# Patient Record
Sex: Male | Born: 1935 | Race: Black or African American | Hispanic: No | Marital: Married | Smoking: Never smoker
Health system: Southern US, Community
[De-identification: ages and names within clinical notes are randomized; demographics above are authoritative.]

## PROBLEM LIST (undated history)

## (undated) DIAGNOSIS — I495 Sick sinus syndrome: Secondary | ICD-10-CM

## (undated) DIAGNOSIS — N189 Chronic kidney disease, unspecified: Secondary | ICD-10-CM

## (undated) DIAGNOSIS — I82402 Acute embolism and thrombosis of unspecified deep veins of left lower extremity: Secondary | ICD-10-CM

## (undated) DIAGNOSIS — K922 Gastrointestinal hemorrhage, unspecified: Secondary | ICD-10-CM

## (undated) DIAGNOSIS — I2510 Atherosclerotic heart disease of native coronary artery without angina pectoris: Secondary | ICD-10-CM

## (undated) DIAGNOSIS — K5792 Diverticulitis of intestine, part unspecified, without perforation or abscess without bleeding: Secondary | ICD-10-CM

## (undated) DIAGNOSIS — I10 Essential (primary) hypertension: Secondary | ICD-10-CM

## (undated) DIAGNOSIS — I5022 Chronic systolic (congestive) heart failure: Secondary | ICD-10-CM

## (undated) DIAGNOSIS — F0390 Unspecified dementia without behavioral disturbance: Secondary | ICD-10-CM

## (undated) DIAGNOSIS — F32A Depression, unspecified: Secondary | ICD-10-CM

## (undated) DIAGNOSIS — F329 Major depressive disorder, single episode, unspecified: Secondary | ICD-10-CM

## (undated) DIAGNOSIS — Z950 Presence of cardiac pacemaker: Secondary | ICD-10-CM

## (undated) DIAGNOSIS — C61 Malignant neoplasm of prostate: Secondary | ICD-10-CM

## (undated) HISTORY — PX: CORONARY ARTERY BYPASS GRAFT: SHX141

## (undated) HISTORY — DX: Chronic kidney disease, unspecified: N18.9

## (undated) HISTORY — PX: PROSTATECTOMY: SHX69

## (undated) HISTORY — DX: Major depressive disorder, single episode, unspecified: F32.9

## (undated) HISTORY — DX: Essential (primary) hypertension: I10

## (undated) HISTORY — DX: Acute embolism and thrombosis of unspecified deep veins of left lower extremity: I82.402

## (undated) HISTORY — DX: Atherosclerotic heart disease of native coronary artery without angina pectoris: I25.10

## (undated) HISTORY — DX: Depression, unspecified: F32.A

---

## 1998-01-22 ENCOUNTER — Other Ambulatory Visit: Admission: RE | Admit: 1998-01-22 | Discharge: 1998-01-22 | Payer: Self-pay | Admitting: Interventional Cardiology

## 1999-05-22 ENCOUNTER — Encounter: Payer: Self-pay | Admitting: Gastroenterology

## 1999-05-22 ENCOUNTER — Ambulatory Visit (HOSPITAL_COMMUNITY): Admission: RE | Admit: 1999-05-22 | Discharge: 1999-05-22 | Payer: Self-pay | Admitting: Gastroenterology

## 1999-06-29 ENCOUNTER — Ambulatory Visit (HOSPITAL_COMMUNITY): Admission: RE | Admit: 1999-06-29 | Discharge: 1999-06-29 | Payer: Self-pay | Admitting: Gastroenterology

## 2000-05-13 ENCOUNTER — Inpatient Hospital Stay (HOSPITAL_COMMUNITY): Admission: EM | Admit: 2000-05-13 | Discharge: 2000-05-17 | Payer: Self-pay | Admitting: Emergency Medicine

## 2000-05-13 ENCOUNTER — Encounter: Payer: Self-pay | Admitting: Emergency Medicine

## 2000-08-21 ENCOUNTER — Emergency Department (HOSPITAL_COMMUNITY): Admission: EM | Admit: 2000-08-21 | Discharge: 2000-08-21 | Payer: Self-pay | Admitting: Emergency Medicine

## 2001-01-02 ENCOUNTER — Emergency Department (HOSPITAL_COMMUNITY): Admission: EM | Admit: 2001-01-02 | Discharge: 2001-01-02 | Payer: Self-pay | Admitting: Emergency Medicine

## 2001-01-02 ENCOUNTER — Encounter: Payer: Self-pay | Admitting: Emergency Medicine

## 2002-04-09 ENCOUNTER — Encounter: Payer: Self-pay | Admitting: Emergency Medicine

## 2002-04-09 ENCOUNTER — Emergency Department (HOSPITAL_COMMUNITY): Admission: EM | Admit: 2002-04-09 | Discharge: 2002-04-09 | Payer: Self-pay | Admitting: Emergency Medicine

## 2002-06-13 ENCOUNTER — Encounter: Admission: RE | Admit: 2002-06-13 | Discharge: 2002-06-13 | Payer: Self-pay | Admitting: Urology

## 2002-06-13 ENCOUNTER — Encounter: Payer: Self-pay | Admitting: Urology

## 2002-09-11 ENCOUNTER — Encounter: Payer: Self-pay | Admitting: Gastroenterology

## 2002-09-11 ENCOUNTER — Ambulatory Visit (HOSPITAL_COMMUNITY): Admission: RE | Admit: 2002-09-11 | Discharge: 2002-09-11 | Payer: Self-pay | Admitting: Gastroenterology

## 2004-02-28 ENCOUNTER — Ambulatory Visit (HOSPITAL_COMMUNITY): Admission: RE | Admit: 2004-02-28 | Discharge: 2004-02-28 | Payer: Self-pay | Admitting: Gastroenterology

## 2004-03-17 ENCOUNTER — Inpatient Hospital Stay (HOSPITAL_COMMUNITY): Admission: EM | Admit: 2004-03-17 | Discharge: 2004-03-24 | Payer: Self-pay | Admitting: Emergency Medicine

## 2004-07-09 ENCOUNTER — Ambulatory Visit (HOSPITAL_COMMUNITY): Admission: RE | Admit: 2004-07-09 | Discharge: 2004-07-09 | Payer: Self-pay | Admitting: Gastroenterology

## 2004-07-15 ENCOUNTER — Ambulatory Visit: Payer: Self-pay | Admitting: Internal Medicine

## 2004-08-18 ENCOUNTER — Emergency Department (HOSPITAL_COMMUNITY): Admission: EM | Admit: 2004-08-18 | Discharge: 2004-08-18 | Payer: Self-pay | Admitting: Emergency Medicine

## 2004-10-19 ENCOUNTER — Ambulatory Visit (HOSPITAL_COMMUNITY): Admission: RE | Admit: 2004-10-19 | Discharge: 2004-10-19 | Payer: Self-pay | Admitting: Urology

## 2005-01-29 ENCOUNTER — Inpatient Hospital Stay (HOSPITAL_COMMUNITY): Admission: EM | Admit: 2005-01-29 | Discharge: 2005-02-04 | Payer: Self-pay | Admitting: Emergency Medicine

## 2005-02-28 ENCOUNTER — Inpatient Hospital Stay (HOSPITAL_COMMUNITY): Admission: EM | Admit: 2005-02-28 | Discharge: 2005-03-11 | Payer: Self-pay | Admitting: Emergency Medicine

## 2005-03-12 ENCOUNTER — Encounter: Admission: RE | Admit: 2005-03-12 | Discharge: 2005-03-12 | Payer: Self-pay | Admitting: Cardiothoracic Surgery

## 2005-03-12 ENCOUNTER — Inpatient Hospital Stay (HOSPITAL_COMMUNITY): Admission: AD | Admit: 2005-03-12 | Discharge: 2005-03-16 | Payer: Self-pay | Admitting: Cardiothoracic Surgery

## 2005-03-23 ENCOUNTER — Emergency Department (HOSPITAL_COMMUNITY): Admission: EM | Admit: 2005-03-23 | Discharge: 2005-03-23 | Payer: Self-pay | Admitting: Emergency Medicine

## 2005-04-08 ENCOUNTER — Encounter (HOSPITAL_COMMUNITY): Admission: RE | Admit: 2005-04-08 | Discharge: 2005-07-07 | Payer: Self-pay | Admitting: Interventional Cardiology

## 2005-07-08 ENCOUNTER — Encounter (HOSPITAL_COMMUNITY): Admission: RE | Admit: 2005-07-08 | Discharge: 2005-07-30 | Payer: Self-pay | Admitting: Interventional Cardiology

## 2005-08-05 ENCOUNTER — Emergency Department (HOSPITAL_COMMUNITY): Admission: EM | Admit: 2005-08-05 | Discharge: 2005-08-05 | Payer: Self-pay | Admitting: Emergency Medicine

## 2005-08-09 ENCOUNTER — Ambulatory Visit (HOSPITAL_COMMUNITY): Admission: RE | Admit: 2005-08-09 | Discharge: 2005-08-09 | Payer: Self-pay | Admitting: Gastroenterology

## 2005-11-03 ENCOUNTER — Encounter (HOSPITAL_COMMUNITY): Admission: RE | Admit: 2005-11-03 | Discharge: 2006-02-01 | Payer: Self-pay | Admitting: Urology

## 2005-12-14 ENCOUNTER — Encounter: Admission: RE | Admit: 2005-12-14 | Discharge: 2005-12-14 | Payer: Self-pay | Admitting: Urology

## 2006-02-19 ENCOUNTER — Inpatient Hospital Stay (HOSPITAL_COMMUNITY): Admission: EM | Admit: 2006-02-19 | Discharge: 2006-02-19 | Payer: Self-pay | Admitting: Emergency Medicine

## 2007-03-03 ENCOUNTER — Encounter: Admission: RE | Admit: 2007-03-03 | Discharge: 2007-03-03 | Payer: Self-pay | Admitting: Gastroenterology

## 2007-08-09 ENCOUNTER — Encounter: Admission: RE | Admit: 2007-08-09 | Discharge: 2007-08-09 | Payer: Self-pay | Admitting: Neurology

## 2007-08-10 ENCOUNTER — Emergency Department (HOSPITAL_COMMUNITY): Admission: EM | Admit: 2007-08-10 | Discharge: 2007-08-10 | Payer: Self-pay | Admitting: Emergency Medicine

## 2007-08-11 ENCOUNTER — Ambulatory Visit (HOSPITAL_COMMUNITY): Admission: RE | Admit: 2007-08-11 | Discharge: 2007-08-11 | Payer: Self-pay

## 2007-08-24 ENCOUNTER — Ambulatory Visit (HOSPITAL_COMMUNITY): Admission: RE | Admit: 2007-08-24 | Discharge: 2007-08-24 | Payer: Self-pay | Admitting: Otolaryngology

## 2007-11-27 ENCOUNTER — Ambulatory Visit (HOSPITAL_COMMUNITY): Admission: RE | Admit: 2007-11-27 | Discharge: 2007-11-27 | Payer: Self-pay | Admitting: Internal Medicine

## 2008-06-21 ENCOUNTER — Encounter (HOSPITAL_COMMUNITY): Admission: RE | Admit: 2008-06-21 | Discharge: 2008-08-01 | Payer: Self-pay | Admitting: Urology

## 2008-08-07 ENCOUNTER — Emergency Department (HOSPITAL_COMMUNITY): Admission: EM | Admit: 2008-08-07 | Discharge: 2008-08-07 | Payer: Self-pay | Admitting: *Deleted

## 2009-09-23 ENCOUNTER — Encounter: Payer: Self-pay | Admitting: Emergency Medicine

## 2009-09-23 ENCOUNTER — Ambulatory Visit: Payer: Self-pay | Admitting: Internal Medicine

## 2009-09-23 ENCOUNTER — Inpatient Hospital Stay (HOSPITAL_COMMUNITY): Admission: EM | Admit: 2009-09-23 | Discharge: 2009-10-01 | Payer: Self-pay | Admitting: Internal Medicine

## 2009-09-25 ENCOUNTER — Encounter (INDEPENDENT_AMBULATORY_CARE_PROVIDER_SITE_OTHER): Payer: Self-pay | Admitting: Internal Medicine

## 2009-11-13 ENCOUNTER — Encounter (INDEPENDENT_AMBULATORY_CARE_PROVIDER_SITE_OTHER): Payer: Self-pay | Admitting: *Deleted

## 2010-02-10 ENCOUNTER — Encounter (INDEPENDENT_AMBULATORY_CARE_PROVIDER_SITE_OTHER): Payer: Self-pay | Admitting: *Deleted

## 2010-08-23 ENCOUNTER — Encounter: Payer: Self-pay | Admitting: Internal Medicine

## 2010-08-23 ENCOUNTER — Encounter: Payer: Self-pay | Admitting: Urology

## 2010-09-01 NOTE — Letter (Signed)
Summary: Device-Delinquent Check  [facility], Main Office  [address]   [place], [place] [address]   Phone: [phone]  Fax: [phone]     November 13, 2009 MRN: [id number]   [facility] [address] [place], [place]  [address]   Dear [name],  According to our records, you have not had your implanted device checked in the recommended period of time.  We are unable to determine appropriate device function without checking your device on a regular basis.  Please call our office to schedule an appointment as soon as possible.  If you are having your device checked by another physician, please call us so that we may update our records.  Thank you, Altha Harm, LPN  November 13, 2009 3:13 PM   [facility]

## 2010-09-01 NOTE — Letter (Signed)
Summary: Device-Delinquent Check  [facility], Main Office  [address]   [place], [place] [address]   Phone: [phone]  Fax: [phone]     February 10, 2010 MRN: [id number]   [facility] [address] [place], [place]  [address]   Dear [name],  According to our records, you have not had your implanted device checked in the recommended period of time.  We are unable to determine appropriate device function without checking your device on a regular basis.  Please call our office to schedule an appointment as soon as possible.  If you are having your device checked by another physician, please call us so that we may update our records.  Thank you,  Altha Harm, LPN  February 10, 2010 10:39 AM  [facility]  2nd notice

## 2010-10-21 LAB — IRON AND TIBC
Iron: 44 ug/dL (ref 42–135)
TIBC: 227 ug/dL (ref 215–435)

## 2010-10-21 LAB — BASIC METABOLIC PANEL
BUN: 27 mg/dL — ABNORMAL HIGH (ref 6–23)
BUN: 34 mg/dL — ABNORMAL HIGH (ref 6–23)
BUN: 35 mg/dL — ABNORMAL HIGH (ref 6–23)
CO2: 31 mEq/L (ref 19–32)
Calcium: 9 mg/dL (ref 8.4–10.5)
Calcium: 9.3 mg/dL (ref 8.4–10.5)
Chloride: 103 mEq/L (ref 96–112)
Creatinine, Ser: 2.14 mg/dL — ABNORMAL HIGH (ref 0.4–1.5)
GFR calc Af Amer: 38 mL/min — ABNORMAL LOW (ref >60)
GFR calc non Af Amer: 30 mL/min — ABNORMAL LOW (ref >60)
GFR calc non Af Amer: 31 mL/min — ABNORMAL LOW (ref >60)
GFR calc non Af Amer: 35 mL/min — ABNORMAL LOW (ref >60)
Glucose, Bld: 134 mg/dL — ABNORMAL HIGH (ref 70–99)
Potassium: 3.8 mEq/L (ref 3.5–5.1)
Potassium: 3.9 mEq/L (ref 3.5–5.1)
Potassium: 4.4 mEq/L (ref 3.5–5.1)
Sodium: 141 mEq/L (ref 135–145)

## 2010-10-21 LAB — GLUCOSE, CAPILLARY
Glucose-Capillary: 115 mg/dL — ABNORMAL HIGH (ref 70–99)
Glucose-Capillary: 128 mg/dL — ABNORMAL HIGH (ref 70–99)
Glucose-Capillary: 130 mg/dL — ABNORMAL HIGH (ref 70–99)
Glucose-Capillary: 142 mg/dL — ABNORMAL HIGH (ref 70–99)
Glucose-Capillary: 146 mg/dL — ABNORMAL HIGH (ref 70–99)
Glucose-Capillary: 156 mg/dL — ABNORMAL HIGH (ref 70–99)
Glucose-Capillary: 157 mg/dL — ABNORMAL HIGH (ref 70–99)
Glucose-Capillary: 157 mg/dL — ABNORMAL HIGH (ref 70–99)
Glucose-Capillary: 159 mg/dL — ABNORMAL HIGH (ref 70–99)
Glucose-Capillary: 163 mg/dL — ABNORMAL HIGH (ref 70–99)
Glucose-Capillary: 164 mg/dL — ABNORMAL HIGH (ref 70–99)
Glucose-Capillary: 171 mg/dL — ABNORMAL HIGH (ref 70–99)
Glucose-Capillary: 175 mg/dL — ABNORMAL HIGH (ref 70–99)
Glucose-Capillary: 177 mg/dL — ABNORMAL HIGH (ref 70–99)
Glucose-Capillary: 191 mg/dL — ABNORMAL HIGH (ref 70–99)
Glucose-Capillary: 240 mg/dL — ABNORMAL HIGH (ref 70–99)

## 2010-10-21 LAB — CBC
HCT: 39.6 % (ref 39.0–52.0)
HCT: 40.8 % (ref 39.0–52.0)
Hemoglobin: 13.5 g/dL (ref 13.0–17.0)
MCHC: 34 g/dL (ref 30.0–36.0)
MCHC: 34.2 g/dL (ref 30.0–36.0)
MCV: 92.6 fL (ref 78.0–100.0)
MCV: 92.9 fL (ref 78.0–100.0)
Platelets: 133 10*3/uL — ABNORMAL LOW (ref 150–400)
Platelets: 158 10*3/uL (ref 150–400)
RBC: 4.15 MIL/uL — ABNORMAL LOW (ref 4.22–5.81)
RBC: 4.25 MIL/uL (ref 4.22–5.81)
RDW: 13.3 % (ref 11.5–15.5)
RDW: 13.4 % (ref 11.5–15.5)
WBC: 4.5 10*3/uL (ref 4.0–10.5)

## 2010-10-21 LAB — CK TOTAL AND CKMB (NOT AT ARMC)
CK, MB: 1.2 ng/mL (ref 0.3–4.0)
Total CK: 43 U/L (ref 7–232)

## 2010-10-21 LAB — PSA: PSA: 26.27 ng/mL — ABNORMAL HIGH (ref 0.10–4.00)

## 2010-10-21 LAB — FREE PSA: PSA, Free: 1.7 ng/mL

## 2010-10-21 LAB — CARDIAC PANEL(CRET KIN+CKTOT+MB+TROPI)
Relative Index: INVALID (ref 0.0–2.5)
Relative Index: INVALID (ref 0.0–2.5)
Total CK: 52 U/L (ref 7–232)
Troponin I: 0.07 ng/mL — ABNORMAL HIGH (ref 0.00–0.06)

## 2010-10-21 LAB — BRAIN NATRIURETIC PEPTIDE
Pro B Natriuretic peptide (BNP): 1357 pg/mL — ABNORMAL HIGH (ref 0.0–100.0)
Pro B Natriuretic peptide (BNP): 172 pg/mL — ABNORMAL HIGH (ref 0.0–100.0)
Pro B Natriuretic peptide (BNP): 3002 pg/mL — ABNORMAL HIGH (ref 0.0–100.0)
Pro B Natriuretic peptide (BNP): 3133 pg/mL — ABNORMAL HIGH (ref 0.0–100.0)

## 2010-10-21 LAB — RETICULOCYTES: Retic Ct Pct: 2.5 % (ref 0.4–3.1)

## 2010-10-21 LAB — HEMOGLOBIN A1C
Hgb A1c MFr Bld: 6.8 % — ABNORMAL HIGH (ref 4.6–6.1)
Mean Plasma Glucose: 148 mg/dL

## 2010-10-21 LAB — TROPONIN I: Troponin I: 0.04 ng/mL (ref 0.00–0.06)

## 2010-10-23 LAB — URINALYSIS, ROUTINE W REFLEX MICROSCOPIC
Hgb urine dipstick: NEGATIVE
Nitrite: NEGATIVE
Specific Gravity, Urine: 1.017 (ref 1.005–1.030)
Urobilinogen, UA: 1 mg/dL (ref 0.0–1.0)

## 2010-10-23 LAB — CBC
HCT: 39.1 % (ref 39.0–52.0)
Hemoglobin: 12.8 g/dL — ABNORMAL LOW (ref 13.0–17.0)
MCHC: 32.7 g/dL (ref 30.0–36.0)
RDW: 13.5 % (ref 11.5–15.5)

## 2010-10-23 LAB — COMPREHENSIVE METABOLIC PANEL
ALT: 36 U/L (ref 0–53)
AST: 25 U/L (ref 0–37)
Alkaline Phosphatase: 60 U/L (ref 39–117)
CO2: 27 mEq/L (ref 19–32)
Chloride: 114 mEq/L — ABNORMAL HIGH (ref 96–112)
GFR calc Af Amer: 36 mL/min — ABNORMAL LOW (ref >60)
GFR calc non Af Amer: 30 mL/min — ABNORMAL LOW (ref >60)
Potassium: 4.7 mEq/L (ref 3.5–5.1)
Sodium: 144 mEq/L (ref 135–145)
Total Bilirubin: 1.1 mg/dL (ref 0.3–1.2)

## 2010-10-23 LAB — FOLATE: Folate: 9.3 ng/mL

## 2010-10-23 LAB — DIFFERENTIAL
Eosinophils Relative: 1 % (ref 0–5)
Lymphocytes Relative: 15 % (ref 12–46)
Lymphs Abs: 0.7 10*3/uL (ref 0.7–4.0)

## 2010-10-23 LAB — BASIC METABOLIC PANEL
CO2: 21 mEq/L (ref 19–32)
Glucose, Bld: 226 mg/dL — ABNORMAL HIGH (ref 70–99)
Potassium: 4.4 mEq/L (ref 3.5–5.1)
Sodium: 141 mEq/L (ref 135–145)

## 2010-10-23 LAB — CK TOTAL AND CKMB (NOT AT ARMC): CK, MB: 0.9 ng/mL (ref 0.3–4.0)

## 2010-10-23 LAB — HEMOGLOBIN A1C: Hgb A1c MFr Bld: 6.8 % — ABNORMAL HIGH (ref 4.6–6.1)

## 2010-10-23 LAB — URINE MICROSCOPIC-ADD ON

## 2010-10-23 LAB — T4, FREE: Free T4: 1.23 ng/dL (ref 0.80–1.80)

## 2010-10-23 LAB — PSA: PSA: 23.4 ng/mL — ABNORMAL HIGH (ref 0.10–4.00)

## 2010-10-23 LAB — VITAMIN B12: Vitamin B-12: 649 pg/mL (ref 211–911)

## 2010-10-23 LAB — RETICULOCYTES: Retic Ct Pct: 2.6 % (ref 0.4–3.1)

## 2010-10-23 LAB — MAGNESIUM: Magnesium: 2.1 mg/dL (ref 1.5–2.5)

## 2010-10-25 LAB — GLUCOSE, CAPILLARY
Glucose-Capillary: 131 mg/dL — ABNORMAL HIGH (ref 70–99)
Glucose-Capillary: 132 mg/dL — ABNORMAL HIGH (ref 70–99)
Glucose-Capillary: 143 mg/dL — ABNORMAL HIGH (ref 70–99)
Glucose-Capillary: 157 mg/dL — ABNORMAL HIGH (ref 70–99)

## 2010-10-25 LAB — CBC
HCT: 40.2 % (ref 39.0–52.0)
HCT: 42.8 % (ref 39.0–52.0)
Hemoglobin: 14.3 g/dL (ref 13.0–17.0)
MCHC: 33.7 g/dL (ref 30.0–36.0)
MCV: 93.8 fL (ref 78.0–100.0)
Platelets: 186 10*3/uL (ref 150–400)
Platelets: 216 10*3/uL (ref 150–400)
RDW: 13.4 % (ref 11.5–15.5)
WBC: 4.1 10*3/uL (ref 4.0–10.5)
WBC: 4.6 10*3/uL (ref 4.0–10.5)

## 2010-11-16 LAB — CBC
HCT: 40.8 % (ref 39.0–52.0)
MCHC: 34.2 g/dL (ref 30.0–36.0)
MCV: 88.9 fL (ref 78.0–100.0)
Platelets: 160 10*3/uL (ref 150–400)
WBC: 4.2 10*3/uL (ref 4.0–10.5)

## 2010-11-16 LAB — DIFFERENTIAL
Basophils Relative: 0 % (ref 0–1)
Eosinophils Absolute: 0.1 10*3/uL (ref 0.0–0.7)
Eosinophils Relative: 2 % (ref 0–5)
Lymphs Abs: 1.5 10*3/uL (ref 0.7–4.0)
Neutrophils Relative %: 52 % (ref 43–77)

## 2010-11-16 LAB — BASIC METABOLIC PANEL
BUN: 22 mg/dL (ref 6–23)
CO2: 27 mEq/L (ref 19–32)
Chloride: 105 mEq/L (ref 96–112)
Creatinine, Ser: 1.84 mg/dL — ABNORMAL HIGH (ref 0.4–1.5)
Glucose, Bld: 159 mg/dL — ABNORMAL HIGH (ref 70–99)
Potassium: 3.7 mEq/L (ref 3.5–5.1)

## 2010-11-16 LAB — POCT CARDIAC MARKERS
CKMB, poc: 1.1 ng/mL (ref 1.0–8.0)
Troponin i, poc: <0.05 ng/mL (ref 0.00–0.09)

## 2010-11-16 LAB — BRAIN NATRIURETIC PEPTIDE: Pro B Natriuretic peptide (BNP): 258 pg/mL — ABNORMAL HIGH (ref 0.0–100.0)

## 2010-12-18 ENCOUNTER — Other Ambulatory Visit: Payer: Self-pay | Admitting: Gastroenterology

## 2010-12-18 NOTE — H&P (Signed)
NAMEKHORI, ROSEVEAR             ACCOUNT NO.:  [id number]   MEDICAL RECORD NO.:  [id number]          PATIENT TYPE:  INP   LOCATION:  1828                         FACILITY:  [facility]   PHYSICIAN:  Peter M. Swaziland, M.D.  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  He is status post CABG in 1991 by Dr.  Ofilia Neas.  He was recently admitted on January 29, 2005 with recurrent  unstable angina.  He was initially treated conservatively, but due to  recurrent angina he did undergo cardiac catheterization on February 03, 2005.  This demonstrated a 70-80% stenosis in LAD after the first diagonal.  He had  a 70-80% diagonal stenosis.  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  The right coronary artery had 85% stenosis proximally, as  well as 85-90% distal disease and a 90% stenosis in the PDA.  The LIMA graft  to the LAD was patent, but the vein grafts were all occluded.  It was felt  that his symptoms were related to progressive disease in his right coronary  artery.  Surgical consultation was obtained, but the patient opted for  continued conservative medical therapy.  Since discharge he has continued to  have exertional angina with minimal exertion.  Today he complains of severe  substernal chest pain at rest, radiating to his left axilla.  He had no  shortness of breath or diaphoresis.  He took three sublingual nitroglycerin  without relief and then presented to the emergency department.  His pain is  now 90% relieved with IV nitroglycerin.   PAST MEDICAL HISTORY:  1.  ASCVD status post CABG in 1991.  This included the LIMA graft to the      LAD, saphenous vein graft to the second obtuse marginal vessel,  saphenous vein graft to the diagonal.  He subsequently had stenting of      the vein graft to the marginal vessel in 2001.  Since then he has had      documented occlusion of both vein grafts.  2.  Hypertension.  3.  Complete heart block status post DDD pacemaker in August 2005.  4.  Gastritis.  5.  Hypercholesterolemia.  6.  Colon polyps.  7.  Status post esophageal dilatation.  8.  History of prostate CA status post radical prostatectomy and radiation      therapy.  9.  Status post drainage of a liver abscess.  10. Chronic renal insufficiency.   ALLERGIES:  1.  INTOLERANCE TO STATINS.  2.  HE HAS HAD A HISTORY OF GASTRITIS ON ASPIRIN.  3.  HE HAS A HISTORY OF COUGH RELATED TO ACE INHIBITORS.   CURRENT MEDICATIONS:  1.  Norvasc 5 mg daily.  2.  Atenolol 50 mg daily.  3.  IMDUR 60 mg daily.  4.  Plavix 75 mg daily.  5.  HCTZ 25 mg every other day.  6.  Protonix 40 mg daily.  7.  Nitroglycerin p.r.n.   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  He is married and has two children.  He denies  smoking or alcohol use.   FAMILY HISTORY:  Noncontributory.   REVIEW OF SYSTEMS:  He denies any history of TIA or stroke.  He has had no  claudication.  Other review of systems are negative.   PHYSICAL EXAMINATION:  The patient is a pleasant black male in no apparent  distress.  He is afebrile, blood pressure is 152/68, pulse 60 and paced,  sats are 100% on 2 liters nasal cannula.  Respirations are unlabored.  HEENT:  Normocephalic and atraumatic.  Pupils equal, round and reactive.  Sclerae clear.  Oropharynx is clear.  NECK:  Without JVD, adenopathy or bruits.  LUNGS:  Clear.  CARDIAC:  Exam reveals regular rate and rhythm without murmurs, rubs,  gallops, or clicks.  ABDOMEN:  Soft, nontender without masses.  EXTREMITIES:  Without edema.  Pulses are 2+ and symmetric.  NEUROLOGIC:  Exam is intact.   LABORATORY DATA:  Chest  x-ray shows no active disease, pacemaker is in  adequate position.  ECG shows AV sequential pacing, white count 3800,  hemoglobin 12.6, hematocrit 36.5, platelets 184,000, sodium 141, potassium  4.4, chloride 109, CO2 25, BUN 18, creatinine 1.8, glucose 120, CK-MB 1.1,  troponin less than 0.05.   IMPRESSION:  1.  Recurrent unstable angina pectoris in a patient with known severe      coronary disease status post coronary artery bypass grafting in 1991.  2.  Chronic renal insufficiency.  3.  Hypertension.  4.  History of gastritis.  5.  History of complete heart block status post DDD pacemaker implant.  6.  Hypercholesterolemia.  7.  History of prostate cancer.   PLAN:  We will admit to telemetry and rule out for myocardial infarction.  He will be treated with intravenous nitroglycerin and subcu Lovenox.  We  will continue his prior medications with the exception of holding his Plavix  in anticipation of possible redo coronary artery bypass surgery.  Need to  reconsult Dr. Tyrone Sage in the morning.       PMJ/MEDQ  D:  02/28/2005  T:  02/28/2005  Job:  [id number]   cc:   Lyn Records, M.D.  [address]  [place]  [place] [address]  Fax: [phone]

## 2010-12-18 NOTE — Op Note (Signed)
[name], [name]             ACCOUNT NO.:  [id number]   MEDICAL RECORD NO.:  [id number]          PATIENT TYPE:  INP   LOCATION:  2003                         FACILITY:  [facility]   PHYSICIAN:  Kerin Perna, M.D.  DATE OF BIRTH:  09-18-1922   DATE OF PROCEDURE:  03/12/2005  DATE OF DISCHARGE:                                 OPERATIVE REPORT   OPERATION:  Right chest tube placement.   PREOPERATIVE DIAGNOSIS:  Right pneumothorax, 50%.   POSTOPERATIVE DIAGNOSIS:  Right pneumothorax, 50%.   SURGEON:  Kerin Perna, M.D.   ANESTHESIA:  Local 1% lidocaine.   INDICATIONS:  The patient is a 77 year old male who had undergone previous  redo coronary bypass grafting by Dr. Tyrone Sage on March 05, 2005.  He was  discharged from the hospital on March 11, 2005. The chest x-ray taken two  days prior to discharge showed a small residual right 15% pneumothorax.  After returning home, following having progressive shortness of breath and  presented to the office today for evaluation. A chest x-ray showed a right  40-50% pneumothorax. His saturation on room air was 97% however, he became  dyspneic with any exertion. It was decided to admit the patient for right  chest tube placement. I discussed the procedure in detail with the patient  including the benefits and risks and he agreed with informed consent.   PROCEDURE:  The right chest was prepped and draped. The patient was  premedicated with IV morphine and Versed. A 1% local lidocaine was  infiltrated in the right inframammary area crease. A 1 inch incision was  made and lidocaine was infiltrated through the subcutaneous tissue down to  the intercostal muscle. The right pleural space was entered and air was  emitted. A 20-French chest tube was placed and directed to the apex and  secured to the skin with two silk sutures. A sterile dressing was applied.  The drainage tube was connected to the Pleur-Evac chamber. The chest x-ray  is  pending.       PV/MEDQ  D:  03/12/2005  T:  03/13/2005  Job:  [id number]

## 2010-12-18 NOTE — Discharge Summary (Signed)
NAMEHILDA, [name]             ACCOUNT NO.:  [id number]   MEDICAL RECORD NO.:  [id number]          PATIENT TYPE:  INP   LOCATION:  2021                         FACILITY:  [facility]   PHYSICIAN:  Sheliah Plane, MD    DATE OF BIRTH:  [birth year]   DATE OF ADMISSION:  02/28/2005  DATE OF DISCHARGE:  03/11/2005                                 DISCHARGE SUMMARY   PRIMARY DIAGNOSIS:  Recurrent coronary occlusive disease.   IN-HOSPITAL DIAGNOSES:  1.  Postoperative atrial fibrillation.  2.  Acute renal insufficiency.  3.  Postoperative thrombocytopenia.   SECONDARY DIAGNOSES:  1.  Atherosclerotic cardiovascular disease, status post coronary artery      bypass graft in 1991.  This included a left internal mammary artery      graft to the left anterior descending, saphenous vein graft to the      second obtuse marginal vessel, saphenous vein graft to the diagonal.      The patient's subsequently had stenting of the vein graft to the      marginal graft to the marginal vessel in 2001.  2.  Hypertension.  3.  Complete heart block, status post DDD pacemaker in August 2005.  4.  Gastritis.  5.  Hypercholesterolemia.  6.  Colon polyps.  7.  Status post esophageal dilatation.  8.  History of prostate cancer, status post radical prostatectomy and      radiation therapy.  9.  Status post drainage of a liver abscess.  10. Chronic renal insufficiency.   ALLERGIES:  The patient states intolerance to STATINS,  history of gastritis  on ASPIRIN. History  of intolerance to COX-2 ACE INHIBITORS.   IN-HOSPITAL OPERATIONS/PROCEDURES:  Repeat coronary artery bypass grafting  x5 with sequential reverse saphenous vein graft to first diagonal distal  circumflex, reverse saphenous vein graft to second diagonal branch, reverse  saphenous vein graft sequential to acute marginal posterior descending  coronary artery, preservation of previously-placed left internal mammary to  the left anterior descending  coronary artery, median sternotomy, and right  leg Endovein harvesting.   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 He is status  post CABG in 1991 by Dr. Tyrone Sage.  The patient also has a history of  complete heart block for which he is status post DDD pacemaker in August of  2005.  He was recently admitted on January 29, 2005 with recurrent unstable  angina.  The patient was initially treated conservatively but had recurrent  angina and underwent cardiac catheterization on February 03, 2005.  This showed  70-80% stenosis in the LAD at the first diagonal.  He had 70-80% diagonal  stenosis.  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  The RCA had  an 85% stenosis proximally as well as 85-90% distal disease and then 80%  stenosis in the PDA.  The LIMA graft to the LAD was patent, but the vein  grafts were all occluded.  Following cardiac catheterization,  CVTS was  consulted.  The patient was seen and evaluated by Dr. Tyrone Sage.  Dr. Jaynie Collins  discussed redo CABG with the patient.  He discussed risks and benefits of  the procedure.  The patient acknowledged understanding and wished to  proceed.  Surgery was scheduled for March 05, 2005.   The patient was taken to the operating room on March 05, 2005 where he  underwent redo coronary artery bypass grafting x5 with sequential reverse  saphenous vein graft to first diagonal distal circumflex, with reverse  saphenous vein graft to the second diagonal branch with reverse saphenous  vein graft sequential to the acute marginal posterior descending coronary  artery.  Preservation of the previously-placed left internal mammary artery  to the left anterior descending artery was done.  The patient had a median  sternotomy and right leg Endovein harvesting.  The patient tolerated  this  procedure well and was transferred up to the intensive care unit in stable  condition.  Following the surgery, the patient seemed to be hemodynamically  stable.  The patient was extubated at the end of surgery.  The patient  remained hemodynamically stable postoperatively.  His chest tubes and lines  were discontinued on postoperative day #1.  He was able to be weaned from  all his drips.  The patient's pacemaker was tested postoperatively.  He was  A-paced during his postoperative course.  Cardiology continued to followup  with the patient during his postoperative course.  Follow-up chest x-rays  were obtained, and the patient was seen to have a small right pneumothorax.  These were continued to be monitored, and the patient was seen to have  resolved his right pneumothorax prior to discharge home.  The patient did  develop postoperative atrial fibrillation for which his medications were  adjusted for a period.  He was able to convert back into normal sinus rhythm  and remained in normal sinus rhythm during the remainder of his hospital  stay.  The patient has a history of chronic renal insufficiency and had  developed acute-on-chronic renal insufficiency postoperatively.  Diuretics  were discontinued, and this was monitored.  The patient was back down to  baseline prior to discharge home.  The patient also developed  thrombocytopenia postoperatively.  This was monitored.  It continued to  trend up over his postoperative course.  The patient's incisions remained  dry and intact and healing well.  He was out of bed, ambulating well.  He  was able to be weaned from oxygen, saturating greater than 90% prior to  discharge home.  The patient's blood pressure was stable.  The patient was  down to a baseline weight prior to discharge.   The patient is discharged to home on postoperative day #6, March 11, 2005,  in stable condition.  A follow-up appointment was scheduled with  Sheliah Plane, M.D. on April 01, 2005 at 11:20 a.m.  The patient will follow up  with Dr. Katrinka Blazing in two weeks.  He will obtain a PA and lateral chest x-ray  with Dr. Katrinka Blazing which he will bring with him to Dr. Dennie Maizes appointment.   Dr. Randa Evens received instructions on diet, activity level, and incisional  care.  He was told not to drive until released to do so.  No heavy lifting  over 10 pounds.  The patient was told he was allowed to shower, washing his  incisions using soap and water.  He is to contact the office if he develops  any drainage or  opening from any of his incision sites.  The patient was  told to ambulate 3-4 times per day, progress as tolerated, and continue his  breathing exercises.  The patient was educated on a diet to be low-fat, low-  salt.  He acknowledges understanding.   DISCHARGE MEDICATIONS:  1.  Aspirin 81 p.o. daily.  2.  Atenolol 50 mg daily.  3.  Norvasc 5 mg daily.  4.  Protonix 20 mg daily.  5.  Multivitamin daily.  6.  Ativan 0.5 mg two times per day p.r.n.  7.  Tylenol No. 3 with codeine 1-2 tablets p.o. q.4-6 h. p.r.n. pain.      Theda Belfast, [place]      Sheliah Plane, MD  Electronically Signed    KMD/MEDQ  D:  06/23/2005  T:  06/23/2005  Job:  [id number]   cc:   Lyn Records, M.D.  Fax: [phone]

## 2010-12-18 NOTE — Op Note (Signed)
[name], [name]             ACCOUNT NO.:  [id number]   MEDICAL RECORD NO.:  [id number]          PATIENT TYPE:  INP   LOCATION:  2302                         FACILITY:  [facility]   PHYSICIAN:  Sheliah Plane, MD    DATE OF BIRTH:  Aug 27, 1922   DATE OF PROCEDURE:  03/05/2005  DATE OF DISCHARGE:                                 OPERATIVE REPORT   PREOPERATIVE DIAGNOSIS:  Coronary occlusive disease.   POSTOPERATIVE DIAGNOSIS:  Coronary occlusive disease.   SURGICAL PROCEDURE:  Repeat coronary artery bypass grafting x5, with  sequential reverse saphenous vein graft to the first diagonal and distal  circumflex; first saphenous vein graft to the second diagonal branch;  reverse saphenous vein graft sequentially to the acute marginal and  posterior descending coronary artery, with preservation of the previously  placed left internal mammary to the left anterior descending coronary  artery; redo sternotomy and right leg Endo-Vein harvesting.   SURGEON:  Sheliah Plane, MD   FIRST ASSISTANT:  Salvatore Decent. [name], M.D.   SECOND ASSISTANT:  Rowe Clack, P.A.-C.   BRIEF HISTORY:  The patient is a 77 year old male with longstanding coronary  occlusive disease, who had undergo coronary artery bypass grafting in 1991.  He had done well until the last year and a half, when he began having  recurrent anginal pains.  A stent graft was placed in a vein graft to the  diagonal.  His mammary artery remained free of disease.  At the patient's  previous operation he did not have coronary occlusive disease in the right  system.  Over the years he has developed a proximal and distal disease  throughout the right system.  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  The patient was to try medical  management, but because of recurrent episodes of anginal pain requiring  readmission in spite of maximal medical treatment, he agreed to proceed with  coronary  artery bypass grafting.  The risks and options were discussed with  the patient in detail, and he signed informed consent.   DESCRIPTION OF PROCEDURE:  The patient underwent general endotracheal  anesthesia without incident.  The skin, chest and legs were prepped with  Betadine and draped in the usual sterile manner.  Using the [employer] Endo-  Vein harvesting system, vein was harvested from the right thigh to the mid  calf.  The additional vein was harvested openly from the left lower leg.  Satisfactory vein was obtained.  A median sternotomy was performed, taking  care to remove underlying lung and cardiac tissue from the posterior table  of the sternum.  The remainder of the dissection of the ascending aorta and  right atrium to allow cannulation was completed without incident.  The  patient was systemically heparinized.  The ascending aorta was cannulated.  The right atrium cannulated, a retrograde cardioplegia catheter was placed.  The remainder of dissection of the left heart was carried out to the left  internal mammary to the LID was identified and preserved.  The patient was  placed on cardiopulmonary bypass 2.4 L/min/sq m.  Sites of anastomosis were  selected and dissected.  The patient's body temperature was cooled to 30  degrees.  Aortic crossclamp was applied, and 500 cc of cold blood __________  cardioplegia was administered, with diastolic arrest to the heart.  Myocardial septal temperature was monitored throughout the crossclamp.  A  bulldog was also placed on the mammary artery.   Attention was turned first to the right system.  Acute marginal coronary  artery was opened and made a 1 mm probe.  The vessel was approximately 1.3 -  1.4 mm in size.  Using a diamond-type, side-to-side anastomosis was carried  out.  Distal extent of the same vein was then carried to the posterior  descending coronary artery, which was opened and admitted a 1 mm probe.  There was diffuse disease  throughout the distal right coronary artery,  extended into the posterior descending.  A distal extent of the same vein  was then anastomosed to the mid portion of the posterior descending coronary  artery.  Additional cold-blood cardioplegia was administered down the vein  graft.   Attention was then turned to the highest diagonal vessel, which was  partially intramyocardial.  The vessel was opened and 1.5 mm probe was  placed past.  Using diamond-type side-to-side anastomosis was carried out.  Distal extent of the same vein was then carried onto a very small distal  circumflex branch; which was the same branch which had been bypassed before.  This was the terminal branches of the anomalous circumflex.  Distal  anastomosis was performed with a running 8.0 Prolene.   Attention was then turned to the second diagonal, which was opened and was  small vessel.  It admitted a 1 mm probe.  Using a running 7.0 Prolene,  distal anastomosis was performed.  With the crossclamp still in place, three-  punch aortotomies were created -- the one for the right vessel was a new  site.  The hood punch sites in the hoods of the two other old vein grafts  were used.  Each of the three vein grafts were anastomosed to the ascending  aorta.  Prior to complete closure of the aortotomy, the heart was allowed to  de-air and the aortic crossclamp was removed.  The patient spontaneously  converted to a DDD paced rhythm.  Crossclamp time was 103 minutes.  Body  temperature was rewarmed to 36 degrees.  He was then started on low-dose  dopamine.  He was ventilated and weaned from cardiopulmonary bypass without  difficulty.  He remained hemodynamically stable.  He was decannulated in the  usual fashion.  Protamine and sulfate was administered.  With operative  field hemostatic, two atrial tube ventricular pacing wires applied to graft. Graft markers were applied.  Two mediastinal tubes left in place.  The  mediastinal  tissue was reapproximated over the ascending aorta.  Median  sternotomy was closed with #6 stainless steel wire.  Fascia closed with  interrupted 0 Vicryl, running 3-0 Vicryl and subcutaneous tissue 4-0  subcuticular stitch in skin edges.  Dry dressings were applied.  Sponge and  needle count was reported as correct at completion of the procedure.  The  patient tolerated the procedure without obvious complications; was  transferred to surgical intensive care unit for further postoperative care.       EG/MEDQ  D:  03/08/2005  T:  03/08/2005  Job:  [id number]   cc:   Lyn Records, M.D.  [address]  [place]  [place] [address]  Fax: [phone]

## 2010-12-18 NOTE — Consult Note (Signed)
[name], [name]              ACCOUNT NO.:  [id number]   MEDICAL RECORD NO.:  [id number]            PATIENT TYPE:   LOCATION:                                 FACILITY:   PHYSICIAN:  Lyn Records, M.D.        DATE OF BIRTH:   DATE OF CONSULTATION:  08/05/2005  DATE OF DISCHARGE:                                   CONSULTATION   INDICATIONS:  For ER evaluation:  Chest pain.   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 This was a second coronary artery bypass operation.  His first  operation had been done in the early 90s. He was visiting his daughter in  the [place] area, over the holidays. They were helping her move into a  new place.  He became experiencing episodes of left parasternal discomfort  and left axillary discomfort and tingling in his left thigh and hand.  This  was nonexertional, it lasted for several hours.  In addition, he had been  noticing some increased frequency of palpitations.  He has a chronic history  of PVC's.  He also has some difficulty with swallowing his pills and felt as  though some pills were stuck in his esophagus for a while. His daughter, who  is a gastroenterologist, had called Dr. Sherin Quarry, and he was set to have an  upper GI endoscopy today; however, because of the chest discomfort he has  been having he cancelled this and came to the emergency room.  He is having  some of the persisting, vague precordial discomfort, and discomfort into his  left arm and tingling in his fingers.   MEDICATIONS:  1.  Benicar 40 mg per day.  2.  Tenormin 50 mg per day.  3.  Nexium 40 mg per day.  4.  He had been on Plavix 75 mg per day, but this has been on hold for 7      days, awaiting the endo.  5.  He does not take aspirin because of a preceding history of having ulcer      disease, but does note that while in [place] that if he were taking      aspirin a lot of his vague tingling and discomfort in his arm and  chest      would go away.  Nitroglycerin has not helped the discomfort.  Exertion      does not exacerbate his discomfort.   OBJECTIVE:  VITAL SIGNS:  Blood pressure 148/88, heart rate 80.  NECK:  Neck veins are not distended.  LUNGS:  Clear.  CARDIAC EXAM:  No gallops, no rubs, no clicks.  EXTREMITIES:  No edema.   EKG:  Reveals AV sequential pacing.  Chest x-ray are unremarkable.  Cardiac  markers are normal.   ASSESSMENT:  1.  Chest and arm discomfort, suspected to be musculoskeletal or      inflammatory.  I do not believe that this is ischemic.  I guess that      there is a possibility that some  of the chest discomfort could be      related to reflux or esophageal pain.  2.  Hypertension.  3.  Coronary atherosclerotic heart disease with recent coronary artery      bypass surgery and no evidence of myocardial injury or ischemia      currently.   PLAN:  1.  Add HCTZ 12.5 mg per day.  2.  Trial of Advil on an as needed basis for chest discomfort.  3.  He should go ahead and pursue the upper GI endoscopy.  4.  I will give him some Clonazepam 0.5 mg to use for anxiety p.r.n.  5.  He requested a prescription for Cipro which his daughter had started him      on because of some diarrhea thinking that his diarrhea may be bacterial.      Lyn Records, M.D.  Electronically Signed     HWS/MEDQ  D:  08/05/2005  T:  08/05/2005  Job:  [id number]   cc:   Tasia Catchings, M.D.  Fax: [phone]

## 2010-12-18 NOTE — Cardiovascular Report (Signed)
[name], [name]             ACCOUNT NO.:  [id number]   MEDICAL RECORD NO.:  [id number]          PATIENT TYPE:  INP   LOCATION:  3740                         FACILITY:  [facility]   PHYSICIAN:  Lyn Records III, M.D.DATE OF BIRTH:  1923/01/16   DATE OF PROCEDURE:  DATE OF DISCHARGE:                              CARDIAC CATHETERIZATION   INDICATION:  History of coronary artery disease with progressive angina  pectoris, prior history of coronary artery bypass grafting 1991 with left  internal mammary artery to the left anterior descending, sequential  saphenous vein graft to the diagonals and obtuse marginal branches, and  saphenous vein graft to the second diagonal, with left internal mammary  artery to the left anterior descending.  He has also had saphenous vein  graft stent implantation in 2001 to the saphenous vein graft to the  circumflex, graft has since occluded.   PROCEDURES PERFORMED:  1.  Left heart cath.  2.  Selective coronary angio.  3.  Left ventriculography  4.  Bypass graft angiography.   DESCRIPTION:  After informed consent, a 6-French sheath was placed in the  right femoral artery using the modified Seldinger technique.  A 6-French A2  multipurpose catheter was used for hemodynamic recordings,  left  ventriculography by hand injection, selective left coronary angiography, and  saphenous vein graft angiography.  We did right coronary angiography with a  Judkins right catheter and left internal mammary artery catheter with a #4 6-  French right Judkins catheter.  The patient tolerated the procedure without  complications.  Angioseal arteriotomy closure performed without  complications.  Good hemostasis was achieved.   RESULTS:  1.  Hemodynamic data:      1.  Aortic pressure 118/53.      2.  Left ventricular pressure 122/5 mmHg.  2.  Left ventriculography:  There was some dyssynchrony of LV contractile      pattern.  Overall LV function is normal, EF is  55-60%, no MR.  3.  Coronary angiography:      1.  Left coronary:  The left main and the LAD are patent.  The LAD          beyond the large first diagonal contains 70-80% obstruction, never          completely laid out on this particular angiogram as on the angiogram          from a year ago.  There was a large diagonal that has a proximal to          mid 70-80% stenosis.  The vein graft to this diagonal is occluded.          There is reflux of contrast into the mammary which arises from the          mid LAD.      2.  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          There is proximal 70% diffuse disease, and  diffuse disease          throughout the marginal territory in this vessel.  The marginals          were small to moderate in size.      3.  Right coronary:  The right coronary is a relatively largely          distributed vessel that contains diffuse disease with 85% stenosis          in the proximal vessel just beyond the origin of the anomalous          circumflex.  There is diffuse disease in the mid right coronary,          there is an 85-90% distal RCA stenosis before the origin of the PDA,          and in the proximal PDA there is segmental 90% stenosis      4.  LIMA to the LAD:  This graft is widely patent.  There is reflux of          contrast both antegrade and retrograde from the graft insertion          site.   CONCLUSIONS:  1.  Progressive angina probably due to gradual worsening of right coronary      disease as noted above.  There is relatively static circumflex disease      compared to the August 2005 study.  2.  The left anterior descending territory is well-supplied with the left      internal mammary artery both antegrade and via the left internal mammary      artery graft to the left anterior descending.  3.  Normal left ventricular function.   PLAN:   Intensify antianginal therapy.  Reconsider surgery.  We will probably  have Dr. Tyrone Sage to reevaluate the patient to get some assessment of what  surgical risk would be at this time in this 77 year old gentleman.       HWS/MEDQ  D:  02/03/2005  T:  02/03/2005  Job:  [id number]   cc:   Sheliah Plane, MD  [address]  [place]  [place] [address]  Email: [email or web address]

## 2010-12-18 NOTE — H&P (Signed)
[name], [name]             ACCOUNT NO.:  [id number]   MEDICAL RECORD NO.:  [id number]          PATIENT TYPE:  INP   LOCATION:                               FACILITY:  [facility]   PHYSICIAN:  Sheliah Plane, MD    DATE OF BIRTH:  1922-10-15   DATE OF ADMISSION:  03/12/2005  DATE OF DISCHARGE:                                HISTORY & PHYSICAL   ADMIT DIAGNOSES:  Shortness of breath.   HISTORY OF PRESENT ILLNESS:  This is a 77 year old African-American male  discharged from the hospital on March 11, 2005 in stable condition after  CABG.  On the first evening home the patient began coughing with a small  amount of clear sputum present.  He then became increasingly short of breath  and could not like down secondary to shortness of breath.  He complains of  only a small amount of substernal chest pain, but denies back pain and side  pain.  He also denies hemoptysis, fever, chills, reflux symptoms, angina,  and palpitations.  Patient contacted [facility] office this morning and was  instructed to go have a chest x-ray taken and then presented to the [facility]  office this afternoon.  Upon examination of chest x-ray the patient has a 40-  50% right pneumothorax.  He presents for history and physical examination  with readmission, chest tube placement.   Past medical history, past surgical history, allergies, and medications:  Please see previous discharge summary.   REVIEW OF SYSTEMS:  Please see HPI for significant positives and negatives.   PHYSICAL EXAMINATION:  VITAL SIGNS:  Blood pressure 134/78, heart rate 72,  respirations 18, temperature 97.1, O2 saturation 97% on room air.  GENERAL:  This is a 77 year old African-American male in no acute distress.  HEENT:  Normocephalic, atraumatic.  Pupils are equal, round, and reactive to  light.  Extraocular movements are intact.  The oral mucosa is pink and  moist.  NECK:  Supple with no JVD, no bruits, and no lymphadenopathy.  The carotids  are  palpable.  LUNGS:  The respirations are asymmetric on inspiration.  The are nonlabored  and clear, but vastly diminished on the right side.  CARDIAC:  Regular rate and rhythm with no murmurs, no rubs, no gallops.  The  sternotomy scar is healing well.  ABDOMEN:  Soft, nontender, nondistended with normoactive bowel sounds.  GENITOURINARY:  Deferred.  RECTAL:  Deferred.  SKIN:  Venectomy scar is healing well.  NEUROLOGIC:  Nonfocal.  The patient is alert and oriented x3.  The gait is  steady.  Muscle strength is 5/5 throughout extremities and symmetric.   ASSESSMENT:  Right 40-50% pneumothorax.   PLAN:  Admit for chest tube placement by Dr. Donata Clay on March 12, 2005.      Pecola Leisure, [place]      Sheliah Plane, MD  Electronically Signed    AY/MEDQ  D:  03/12/2005  T:  03/12/2005  Job:  [id number]   cc:   Kerin Perna, M.D.  [address]  [place]  [place] [address]

## 2010-12-18 NOTE — Op Note (Signed)
[name], [name]             ACCOUNT NO.:  [id number]   MEDICAL RECORD NO.:  [id number]          PATIENT TYPE:  AMB   LOCATION:  ENDO                         FACILITY:  [facility]   PHYSICIAN:  Bernette Redbird, M.D.   DATE OF BIRTH:  Dec 13, 1922   DATE OF PROCEDURE:  08/09/2005  DATE OF DISCHARGE:  03/11/2005                                 OPERATIVE REPORT   PROCEDURE:  Upper endoscopy with Savary dilatation of esophagus.   INDICATION:  This is an 77 year old gentleman with recurrent esophageal  dysphagia symptoms, dilated on multiple previous occasions by Dr. Sherin Quarry,  most recently to 15 mm approximately 1 year ago.   FINDINGS:  No obvious ring or stricture identified but successful dilatation  to 18 mm performed.   PROCEDURE:  The nature, purpose, risks of the procedure were familiar to the  patient and the risks had been reviewed with him and he provided written  consent. Sedation was fentanyl 70 mcg and Versed 7 milligrams IV without  arrhythmias or desaturation. The Olympus video endoscope was passed under  direct vision. The vocal cords looked normal. The esophagus was easily  entered and had normal mucosa without evidence of reflux esophagitis,  Barrett's esophagus, varices, infection, neoplasia or any obvious ring  stricture or hiatal hernia.   The stomach contained no significant residual and had normal mucosa without  evidence of gastritis, erosions, ulcers, polyps or masses including a  retroflexed view of the cardia, and the pylorus, duodenal bulb and proximal  second duodenum looked normal.   Savary dilatation was then performed in the standard fashion. The spring tip  guidewire was passed through the scope. The scope was removed in an exchange  fashion, and then a 16 mm dilator was slid over the guidewire under  fluoroscopic guidance. The dilator and guidewire were removed and the  esophagus was reinspected endoscopically. There was a superficial mucosal  abrasion in the proximal esophagus just below the cricopharyngeus region,  without evidence of laceration or perforation, and a little bit of mucosal  disruption in the region of the squamocolumnar junction, even though no ring  had been identified prior to the procedure.   It was therefore deemed safe and appropriate to proceed to my standard  dilatation diameter of 18 mm. The spring tip guidewire was reintroduced, the  scope was removed and an 18 mm dilator was gently passed under direct  vision. No discrete pop or click was encountered. After removing the  dilator and guidewire, the patient was re-endoscoped under direct vision.  There was again some abrasion noted in the proximal esophagus without  excessive trauma or evidence of perforation, and a tiny bit of mucosal  disruption in the region of the lower esophageal sphincter.   The scope was then removed from the patient who tolerated the procedure well  without any apparent complication. At the time this dictation, approximately  20 minutes following the conclusion of the procedure, the patient was  resting comfortably in recovery.   IMPRESSIO:  1.  Dysphagia, without discrete stricture or ring to account for it.  2.  Dilatation performed to  18 mm as described above. The presence of some      mucosal disruption of the squamocolumnar junction suggested there      probably was an unseen ring or mild stricture there.   PLAN:  Clinical follow-up of dysphagia symptoms.           ______________________________  Bernette Redbird, M.D.     RB/MEDQ  D:  08/09/2005  T:  08/09/2005  Job:  [id number]   cc:   Tasia Catchings, M.D.  Fax: [phone]

## 2010-12-18 NOTE — H&P (Signed)
[name], [name]             ACCOUNT NO.:  [id number]   MEDICAL RECORD NO.:  [id number]          PATIENT TYPE:  EMS   LOCATION:  MAJO                         FACILITY:  [facility]   PHYSICIAN:  Cassell Clement, M.D. DATE OF BIRTH:  1922/10/15   DATE OF ADMISSION:  02/19/2006  DATE OF DISCHARGE:                                HISTORY & PHYSICAL   CHIEF COMPLAINT:  Left axillary pain.   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  The pain began about a week  ago and, it has waxed and waned since then.  He came to the emergency room  this morning for evaluation.  He has had no nausea or vomiting, sweating or  dyspnea or diaphoresis.  He was able to walk on his treadmill yesterday as  usual without any difficulty.  He does have known ischemic heart disease.  He had his initial CABG in 1990 and had a redo CABG in August 2006.  Both  procedures were done by Dr. Sheliah Plane.  The patient has had no  subsequent ischemic testing since his redo last summer.  He does walk on the  treadmill at home 5 days a week without difficulty.   HOME MEDICINES:  1.  AcipHex 20 mg daily.  2.  Tenormin 50 mg at bedtime.  3.  Hydrochlorothiazide 50 mg every other day.   He is not presently on aspirin because of history of ulcer and not on statin  because of prior problems with statin-induced myalgias.   The patient is allergic to ORAL TYPE OF IVP DYE.  At the time of his  catheterization he has been premedicated successfully.   FAMILY HISTORY:  His mother died of old age at 79.  His father died of  typhoid fever at 84.  He had a sister, who died of sudden cardiac death at  8.   SOCIAL HISTORY:  He is married.  He has two grown daughters, both of whom  are physicians.   The previous surgery includes CABG twice.  He had a radical prostatectomy by  Dr. Brunilda Payor in 1994.  He had postoperative radiation as well.  In 2005  he  underwent implantation of a dual-chamber pacemaker by Duke Salvia, M.D.,  for symptomatic bradycardia.   REVIEW OF SYSTEMS:  He has had some intermittent brief diarrhea spells over  the last several weeks but no other active GI problems.  He does have a  history of chronic reflux, for which she is on AcipHex.  GENITOURINARY:  He  does have a history of prostate cancer and his PSA has been rising.  The  last number was about 8.  Right now he is under observation with watchful  waiting by Dr. Brunilda Payor.  He is not having any dysuria but does have nocturia  x2.  He denies any respiratory symptoms or cough.  He has had some arthritic  discomfort in his low back.  He does have a history of hypertension and has  a history of polycystic kidney disease and  renal insufficiency with  creatinine above 2.   Remainder of review of systems is negative in detail.   PHYSICAL EXAMINATION:  VITAL SIGNS:  Blood pressure is 165/75, pulse 60 and  paced.  GENERAL:  This a well-developed, well-nourished black gentleman in no acute  distress.  HEENT: Negative.  Jugular venous pressure normal.  Carotids normal.  Thyroid  normal.  CHEST:  Clear.  HEART:  No gallop.  He does have a soft systolic murmur, which he states he  has had before.  The murmur sorry is heard at the lower left sternal edge  and at the base.  ABDOMEN:  Soft and nontender.  No mass.  EXTREMITIES:  1+ ankle and pretibial edema.  He does have weak pedal pulses.  NEUROLOGIC:  Physiologic.   Chest x-ray shows normal heart size and clear lung fields and a dual-chamber  pacemaker.  EKG shows an AV paced rhythm.  Laboratory studies so far show a  sodium 141, potassium 4.2, BUN 22 and his creatinine of 2.2.  Initial  cardiac enzymes are negative with CK-MB 1.5, troponin 0.05.  His hemoglobin  is 14.  White count is pending.   IMPRESSION:  1.  Left axillary pain, rule out atypical angina, rule out myocardial      infarction, rule out acute  coronary syndrome.  2.  History of known ischemic heart disease, status post coronary artery      bypass grafting and status post redo coronary artery bypass grafting.  3.  Renal insufficiency with a history of polycystic kidney disease.  4.  History of reflux and gastroesophageal reflux disease.  5.  History of prostate cancer with last PSA of 8.  6.  Essential hypertension.  7.  Functioning dual-chamber pacemaker.   PLAN:  He is being admitted to the services of Dr. Verdis Prime for  telemetry.  Will get serial enzymes,  will treat with IV heparin and IV  nitroglycerin.  Will continue him on beta blocker and aspirin.  Will check  his lipids.  Will follow renal function closely.  Further workup per Dr.  Verdis Prime.  If he rules out for a myocardial infarction, he may need  another bone scan to rule out a bony lesion as a cause of his axillary pain.           ______________________________  Cassell Clement, M.D.     TB/MEDQ  D:  02/19/2006  T:  02/19/2006  Job:  [id number]   cc:   Lyn Records, M.D.  Fax: [phone]   Lindaann Slough, M.D.  Fax: [phone]

## 2010-12-18 NOTE — Discharge Summary (Signed)
[name], [name]             ACCOUNT NO.:  [id number]   MEDICAL RECORD NO.:  [id number]          PATIENT TYPE:  INP   LOCATION:  3740                         FACILITY:  [facility]   PHYSICIAN:  Lyn Records, M.D.   DATE OF BIRTH:  06/22/1923   DATE OF ADMISSION:  01/29/2005  DATE OF DISCHARGE:  02/04/2005                                 DISCHARGE SUMMARY   CHIEF COMPLAINT AND REASON FOR ADMISSION:  [name] is a 77 year old male  patient of Dr. Verdis Prime with a history of CAD, prior coronary artery  bypass surgery in 1991 who has had recurrent issues recently with chest  discomfort. He was admitted by Dr. Donnie Aho with problems related to pain  between the shoulder blades as well as exertional chest pressure that has  been radiating down the left arm.   The patient was hospitalized back in August of 2005 due to problems related  to chest pain. Catheterization done at that time showed progression of his  coronary artery disease. He was reevaluated by Sheliah Plane, MD with [facility]  and felt not to be a candidate for coronary intervention. Medical therapy  was initiated during that last hospitalization. The patient had symptomatic  third degree heart block requiring insertion of a dual chamber pacemaker as  well.   Again the patient has been experiencing exertional chest pain for about two  weeks prior to this admission. The symptoms would relieve with the use of  nitroglycerin but because of progression of symptoms, the patient opted to  be seen in the ER and was subsequently admitted by Dr. Donnie Aho with the  following diagnoses.   1.  Chest pain consistent with unstable angina.  2.  History of coronary artery disease and prior coronary artery bypass with      progression of bypass graft disease.  3.  Hypertension.  4.  Diet controlled diabetes.  5.  History of prostate cancer.  6.  Prior intolerance to Statin therapy.  7.  Aspirin intolerance secondary to history of  gastritis.   HOSPITAL COURSE:  #1.  CHEST DISCOMFORT UNSTABLE ANGINA.  The patient was  admitted to the telemetry unit where serial cardiac isoenzymes were  obtained, these were negative. EKG's were checked, these showed no acute  ischemia or any additional changes. The patient remained AV paced. The  patient continued to have intermittent bouts of chest pressure mostly  resolving spontaneously occasionally requiring the use of sublingual  nitroglycerin. Plavix was added by Dr. Donnie Aho as well as Norvasc. The  Norvasc was added to help with coronary vasospasm.   Dr. Katrinka Blazing reassumed care on February 01, 2005 at which time Imdur was added and  Dr. Katrinka Blazing recommended repeat coronary angiography if patient continued to  have recurrent angina. By February 02, 2005, the patient was continuing to have  angina so the patient was set for coronary angiography on February 03, 2005.   The patient did undergo coronary angiography on February 03, 2005, please refer  to Dr. Michaelle Copas cath report for details. Essentially, the patient has  progressive angina due to gradual worsening of right  coronary artery  disease, relative static circumflex disease compared to the obvious 2005  study. The LIMA to the LAD is providing good blood flow to that vessel and  he has normal LV function.   On February 04, 2005, the patient's right groin was unremarkable. His creatinine  was slightly increased to 1.9, baseline was 1.6 at admission. The patient is  on hydrochlorothiazide for hypertensive therapy and because of the slight  bump in creatinine, I have discussed with the patient the need to hold the  hydrochlorothiazide for two doses and the patient will have a BMET checked  with Dr. Katrinka Blazing upon followup at the office next week.   With the addition of Plavix, I have also added at discharge Protonix to be  used during the course of treatment while the patient is on Plavix to  minimize risk for redevelopment of gastritis. In the past, the  patient has  taken himself off of Plavix due to fears of recurrent gastritis.   FINAL DIAGNOSES:  1.  Unstable angina secondary to probable progression of right coronary      artery disease.  2.  Known history of coronary artery disease and coronary artery bypass      graft.  3.  Hypertension currently controlled.  4.  History of bradycardia requiring dual chamber pacemaker.  5.  History of gastritis.  6.  History of Statin intolerance.  7.  History of prostate cancer with probable recurrence.   DISCHARGE MEDICATIONS:  1.  Norvasc 5 mg daily.  2.  Atenolol 50 mg daily. The patient uses Tenormin brand.  3.  Imdur 60 mg daily.  4.  Plavix 75 mg daily.  5.  Hydrochlorothiazide 25 mg ever other day. The patient has been      instructed to hold for the next two doses.  6.  Nitroglycerin 0.4 mg sublingual p.r.n. anginal symptoms.  7.  Protonix 40 mg daily while taking Plavix.   DIET:  Heart healthy.   ACTIVITY:  Shower only next two days, no lifting more than 10 pounds next  two days, call our office if you have any problems with the right groin.   FOLLOW UP:  See Dr. Katrinka Blazing on July 13 at 3:30 p.m. He is to have a BMET drawn  at our office at 3 p.m. on the same date.       ALE/MEDQ  D:  02/04/2005  T:  02/04/2005  Job:  [id number]

## 2010-12-18 NOTE — Discharge Summary (Signed)
[name], [name]             ACCOUNT NO.:  [id number]   MEDICAL RECORD NO.:  [id number]          PATIENT TYPE:  INP   LOCATION:  2003                         FACILITY:  [facility]   PHYSICIAN:  Kerin Perna, M.D.  DATE OF BIRTH:  1923-02-27   DATE OF ADMISSION:  03/12/2005  DATE OF DISCHARGE:  03/16/2005                                 DISCHARGE SUMMARY   PRIMARY ADMITTING DIAGNOSIS:  Shortness of breath.   DISCHARGE DIAGNOSES:  1.  Right pneumothorax.  2.  History of coronary artery disease status post coronary artery bypass      graft on March 05, 2005 by Dr. Tyrone Sage.  3.  History of hypertension.  4.  History of prostate cancer.  5.  History of liver abscesses status post needle biopsy and drainage      approximately 10 years ago.   PROCEDURE PERFORMED:  Placement of right-sided chest tube.   HISTORY:  The patient is 77 year old male status post redo CABG by Dr.  Tyrone Sage on March 05, 2005. He did well postoperatively was discharged home  on March 11, 2005. His chest x-ray two days prior to discharge showed a  small residual right 15% pneumothorax. He was stable and asymptomatic at the  time. After he was discharged, he developed progressive shortness of breath  with a small amount of substernal chest pain. He contacted the [facility] office  and was seen by Dr. Morton Peters for further evaluation. He had a chest x-ray  which showed a 40-50% right pneumothorax. Because of this, it was felt that  he should be admitted and right chest tube placed.   HOSPITAL COURSE:  The patient was admitted to [facility]  on August , 11, 2006 and Dr. Zenaida Niece Tright placed a right-sided chest tube at  the bedside. He tolerated the procedure well. Since that time, he has done  well. Shortness of breath improved significantly following placement of  chest tube. His chest tube was initially managed on suction and his  pneumothorax resolved. After 24 hours on suction, the chest tube  was  decreased to water seal. Follow-up chest x-ray continued to show resolution  of pneumothorax and no evidence of air leak on physical exam. On March 15, 2005, his chest tube was removed. A follow-up chest x-ray is pending at this  time. He had otherwise done well during this admission. He has remained  afebrile and his vital signs have been stable. His labs showed a sodium of  136, potassium 4.7, BUN 15, creatinine 1.7. He is maintaining O2 saturations  of greater than 90% on room air. He has afebrile and all vital signs have  been stable. He has been ambulating in the halls without problem. He will  have PA and lateral chest x-ray in the morning March 16, 2005, and if at  that time he continues to have no evidence of recurrent pneumothorax, he  will be ready for discharge home provided no other changes have occurred.   DISCHARGE MEDICATIONS:  1.  Enteric-coated aspirin 81 milligrams daily.  2.  Atenolol 50 milligrams q.d.  3.  Norvasc 5 milligrams q.d.  4.  Protonix 40 milligrams q.d.  5.  Multivitamin one q.d.  6.  Ativan 0.5 milligrams p.r.n.  7.  Tylox 1 to 2 q.4h. p.r.n. for pain.   DISCHARGE INSTRUCTIONS:  He is asked to refrain from driving, heavy lifting  or strenuous activity. He may continue ambulating daily and using his  incentive spirometer. He may shower daily and clean his incisions with soap  and water. He will continue his same preoperative diet.   DISCHARGE FOLLOW-UP:  He will keep his previously scheduled appointment with  Dr. Katrinka Blazing in two weeks. He will also keep his previously scheduled  appointment with Dr. Tyrone Sage on April 01, 2005 at 11:20 a.m. He will have  a chest x-ray at Dr. Michaelle Copas office and is asked to bring it the following  week to Dr. Dennie Maizes appointment. If he experiences any problems in the  interim, he is asked to contact our office immediately.      Coral Ceo, P.A.      Kerin Perna, M.D.  Electronically Signed     GC/MEDQ  D:  03/15/2005  T:  03/15/2005  Job:  [id number]   cc:   Lyn Records, M.D.  [address]  [address]  [place]  [place] [address]  Fax: [phone]   [facility] office

## 2010-12-18 NOTE — Consult Note (Signed)
NAMEBRISCOE, [name]             ACCOUNT NO.:  [id number]   MEDICAL RECORD NO.:  [id number]          PATIENT TYPE:  INP   LOCATION:  2905                         FACILITY:  [facility]   PHYSICIAN:  Sheliah Plane, MD    DATE OF BIRTH:  1923-06-08   DATE OF CONSULTATION:  03/01/2005  DATE OF DISCHARGE:                                   CONSULTATION   REQUESTING PHYSICIAN:  Dr. Katrinka Blazing.   FOLLOWUP CARDIOLOGIST:  Dr. Katrinka Blazing.   REASON FOR CONSULTATION:  Recurrent coronary occlusive disease.   HISTORY OF PRESENT ILLNESS:  The patient is a 77 year old male who was  readmitted yesterday with recurrent anginal prolonged chest pain.  The  patient has a known history of coronary occlusive disease, having had  coronary artery bypass grafting done in 1991, with bypass x4 with left  internal mammary to the proximal left anterior descending coronary artery,  reverse saphenous vein graft to the first diagonal, reverse saphenous vein  graft to the second diagonal and distal circumflex.  At that time, the right  coronary artery was free of disease.  Subsequently, the patient had  angioplasty and stent placement in a diagonal in the second obtuse marginal  vein graft.  In August of 2005, he had recurrent angina, considered redo  coronary artery bypass grafting, however, and ultimately decided to continue  with medical therapy.  He did well until late June when he began having  recurring episodes of rest angina precipitating in admission in early July.  A repeat cardiac catheterization was performed which showed loss of the vein  grafts with a patent mammary.  The patient again wished to try medical  therapy but at the time of catheterization was noted to have progression of  disease, especially in the ostium of the right coronary artery.  The  circumflex arises off the right.  The patient again was discharged home  wishing to continue with medical therapy; however, over the past several  weeks he has  had increasing episodes of pain and was readmitted yesterday  with an episode of prolonged chest pain relieved with nitroglycerin.   Patient in August of 2005 was noted to have intermittent heart block and a  DDD pacemaker was placed on March 20, 2004.   PAST MEDICAL HISTORY:  1.  As noted above.  2.  History of hypertension.  3.  History of prostate cancer.  4.  History of liver abscess requiring needle biopsy and drainage      approximately 10 years ago.   OUTPATIENT MEDICATIONS INCLUDE:  Plavix, Benicar, hydrochlorothiazide and  Tenormin.   FAMILY HISTORY:  Is positive for hypertension, CVAs and coronary disease.   SOCIAL HISTORY:  The patient is married, he is a retired Designer, industrial/product.  Denies alcohol or tobacco use.  He has 2 daughters, both of who are  physicians.   REVIEW OF SYSTEMS:  The patient denies any constitutional symptoms, denies  amaurosis, denies TIAs, denies any blood in his stool or urine.  Does have  some urinary frequency.   PHYSICAL EXAM:  Patient appears younger than his stated age of 23 years.  Blood pressure is 140/68, pulse is 60 and regular, respiratory rate is 18,  O2 sat is 100%.  Pupils equal, round and reactive to light.  NECK:  Without carotid bruits.  LUNGS:  Clear bilaterally.  CARDIAC EXAM:  Reveals regular rhythm without murmur or gallop.  ABDOMINAL EXAM:  Is benign without palpable masses.  LOWER EXTREMITIES:  Revealed a vein harvested from the right thigh to the  right ankle.  The vein appears patent on the left.   LABORATORY FINDINGS:  Revealed a CK MB of 1.1, a troponin of less than .05.  Catheterization films are reviewed.  The patient's creatinine is elevated to  1.8, which was at baseline.   After reviewing the patient's films, I agree with Dr. Katrinka Blazing at this point,  in spite of increased risk because of his age and redo status and renal  insufficiency, that the patient can no longer continue on medical therapy  and redo  coronary artery bypass grafting is recommended.  The risks of the  procedure including death, infection, stroke, myocardial infarction,  bleeding, blood transfusion all discussed with the patient in detail.  Since  he has been on Plavix, which was stopped yesterday, will continue him on  heparin and/or nitroglycerin as needed and tentatively plan for surgery on  March 05, 2005.   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  The patient has had his questions  answered and is willing to proceed.       EG/MEDQ  D:  03/01/2005  T:  03/01/2005  Job:  [id number]

## 2010-12-18 NOTE — Discharge Summary (Signed)
[name], [name]             ACCOUNT NO.:  [id number]   MEDICAL RECORD NO.:  [id number]          PATIENT TYPE:  INP   LOCATION:  3712                         FACILITY:  [facility]   PHYSICIAN:  Lyn Records, M.D.   DATE OF BIRTH:  Sep 13, 1922   DATE OF ADMISSION:  02/19/2006  DATE OF DISCHARGE:  02/19/2006                                 DISCHARGE SUMMARY   DISCHARGE DIAGNOSES:  1. Left axillary pain, resolved.  2. Ischemic heart disease status post CABG with redo bypass grafting as      well.  3. Renal insufficiency with polycystic kidney disease.  4. GERD.  5. History of prostate cancer.  6. Essential hypertension.  7. Dual chamber pacemaker.   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  The  discomfort has been present for approximately one week.  He has a history of  CABG in 1990 with a redo CABG in 2006.   He was admitted to [facility] on February 19, 2006 and monitored during the  day.  His cardiac markers were negative.  Total cholesterol 203,  triglycerides 78, LDL 139, HDL 48, PSA 6.65 (apparently he has recently been  at 8.0), sodium 141, potassium 4.1, BUN 21, creatinine 2.2, white count 4.1,  hemoglobin 14.1, hematocrit 43.5.  EKG:  AV pace rhythm.   The patient was monitored for approximately 21 hours and was discharged to  home.  Apparently, the patient had recurring axillary and left arm  discomfort for years, he exercises and it is not affected.  He had no acute  EKG changes and his cardiac markers were negative x3.  His creatinine is  stable at 2.2.  We will discharge him to home, giving him Darvocet plus  Tylenol for axillary pain, clonazepam for anxiety and if his pain continues,  we will go ahead and order a bone scan.   He is to continue hydrochlorothiazide 50 mg daily, Tenormin 50 mg a day,  AcipHex 20 mg a day, clonazepam 0.5 mg one-half to one tablet  p.o. nightly  as needed, Darvocet-N 100 as needed for axillary pain.  He is to follow up  with Dr. Katrinka Blazing as needed.      Guy Franco, P.A.      Lyn Records, M.D.  Electronically Signed    LB/MEDQ  D:  03/14/2006  T:  03/14/2006  Job:  [id number]   cc:   Lyn Records, M.D.  Lindaann Slough, M.D.

## 2010-12-18 NOTE — Consult Note (Signed)
[name], [name]             ACCOUNT NO.:  [id number]   MEDICAL RECORD NO.:  [id number]          PATIENT TYPE:  INP   LOCATION:  1824                         FACILITY:  [facility]   PHYSICIAN:  W. Ashley Royalty., M.D.DATE OF BIRTH:  March 04, 1923   DATE OF CONSULTATION:  DATE OF DISCHARGE:                                   CONSULTATION   REASON FOR ADMISSION:  Unstable angina.   HISTORY:  The patient is a 77 year old black male with a known history of  coronary artery disease.  He has a prior history of coronary artery bypass  grafting in 1991 with an internal mammary graft to the LAD, a vein graft to  the second marginal branch, and a vein graft to the diagonal branch.  He has  collaterals of the circumflex off the right coronary artery.  In 2001, he  had a stent placement of the vein graft to the second marginal branch.  In  August, 2005, he was admitted with transient incomplete heart block and some  ischemia.  During that admission, he had repeat catheterization that showed  occlusion of his vein graft with a patent internal mammary graft and severe  disease involving an anomalous circumflex, 90% first diagonal branch, and  diffuse disease involving the right coronary artery.  The left main coronary  artery had a 50% stenosis.  He also had mild renal insufficiency.  He was  not felt to be a candidate for coronary intervention and had surgical  consultation by Dr. Sheliah Plane, who felt that he could either have redo  surgery or medical therapy, and the patient opted for medical therapy.  He  had placement of a permanent dual-chamber pacemaker for complete heart block  that was transient at that time.  The patient has been managed medically  since then and states that he has had minimal angina.  He had been able to  walk and be involved in normal activities and could walk around 30 minutes  per day.  He complained of vague chest discomfort.  Around two weeks ago, he  had an  episode of chest discomfort while walking up stairs and has had  worsening chest discomfort over the past several days with several episodes  requiring relief of nitroglycerin the day of admission.  He is complaining  of mild vague pain in his mid scapular region.  An EKG shows a paced rhythm.  He is admitted at this time to rule out unstable angina.  He has not  awakened at night with any angina.   PAST MEDICAL HISTORY:  Remarkable for hypertension.  He is on no cholesterol-  lowering medicines except for niacin and states he cannot take statins.  He  has a history of gastritis and states he is intolerant to aspirin.  He has a  history of colon polyps and has a history of esophageal dilation and also  has a history of prostate cancer.  The prostate cancer evidently was treated  with radical prostatectomy followed by radiation, and his PSA is slowly  rising now.  He has not had hormonal therapy.  PAST SURGICAL HISTORY:  Coronary artery bypass grafting, radical  prostatectomy, drainage of a liver abscess.   ALLERGIES:  Intolerance to ASPIRIN and to STATIN therapy.   CURRENT MEDICATIONS:  1.  HCTZ 50 mg daily.  2.  Tenormin 50 mg daily.   FAMILY HISTORY:  Noncontributory and recorded in old records.   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  He has been married for around 50 years.  He is a nonsmoker.  He does not use alcohol to excess.  He has two children who are both  physicians.   REVIEW OF SYSTEMS:  His weight has been stable and thin.  He has a history  of mild cataracts.  No previous ears, eyes, nose and throat problems.  He  has no asthma, cough, or wheezing.  He did have intolerance to ACE  inhibitors, evidently in the past with cough.  He has a history of mild mid  back pain.  He has a history of a liver abscess drained several years ago  following a trip to [place] and has a history of colon polyps in the past.  He  has no intermittent claudication and does not have significant arthritis.  No history of stroke or TIA.   PHYSICAL EXAMINATION:  VITAL SIGNS:  Blood pressure 160/73.  Pulse currently  70 and paced and regular.  GENERAL:  He is a pleasant, thin black male appearing younger than stated  age.  SKIN:  Warm and dry.  HEENT:  EOMI.  PERRLA.  CNS clear.  Fundi not examined.  Pharynx negative.  NECK:  Supple without JVD, mass, thyromegaly, or bruits.  LUNGS:  Clear to A&P.  CARDIAC:  Normal S1 and S2.  No S3, S4, or murmur.  ABDOMEN:  Soft, nontender.  No mass, hepatosplenomegaly, or aneurysm.  EXTREMITIES:  Femoral pulses 2+.  Distal pulses 2+.  No edema noted.   A 12-lead EKG shows a sinus rhythm with a ventricularly paced rhythm.   His random glucose is 166.  BUN 23. Initial point-of-care enzymes are  normal.  Creatinine is 1.6.  CBC was normal.   IMPRESSION:  1.  Chest pain, consistent with unstable angina.  2.  Coronary artery disease with previous bypass graft disease.      1.  Patent internal mammary graft.  The significant two-vessel disease          involving the circumflex and the right coronary artery.  Opted for          medical therapy.  3.  Hypertension, not well controlled.  4.  Elevation of glucose without previous diagnosis of hypertension.  5.  History of prostate cancer with probable recurrence.  6.  Intolerance to statin therapy.  7.  Aspirin intolerance.   RECOMMENDATIONS:  Patient will be placed on Lovenox and Plavix.  His blood  pressure is elevated, and he will be placed on Norvasc.  His glucose is  somewhat elevated, and he will have a hemoglobin A1c and repeat glucose  level.  Further workup and followup per Dr. Garnette Scheuermann.       WST/MEDQ  D:  01/29/2005  T:  01/29/2005  Job:  [id number]

## 2011-04-21 LAB — I-STAT 8, (EC8 V) (CONVERTED LAB)
BUN: 18
Bicarbonate: 26.8 — ABNORMAL HIGH
Glucose, Bld: 196 — ABNORMAL HIGH
Hemoglobin: 15
pCO2, Ven: 44.9 — ABNORMAL LOW
pH, Ven: 7.385 — ABNORMAL HIGH

## 2011-04-21 LAB — INFLUENZA A+B VIRUS AG-DIRECT(RAPID)
Inflenza A Ag: NEGATIVE
Influenza B Ag: NEGATIVE

## 2011-04-21 LAB — DIFFERENTIAL
Basophils Absolute: 0
Basophils Relative: 0
Eosinophils Absolute: 0
Eosinophils Relative: 0
Lymphocytes Relative: 10 — ABNORMAL LOW
Lymphs Abs: 0.7
Monocytes Absolute: 0.4
Monocytes Relative: 6
Neutro Abs: 5.7
Neutrophils Relative %: 84 — ABNORMAL HIGH

## 2011-04-21 LAB — CBC
HCT: 40.3
Hemoglobin: 13.8
MCHC: 34.2
MCV: 89.4
Platelets: 144 — ABNORMAL LOW
RBC: 4.5
RDW: 12.9
WBC: 6.8

## 2011-04-21 LAB — RAPID STREP SCREEN (MED CTR MEBANE ONLY): Streptococcus, Group A Screen (Direct): NEGATIVE

## 2011-07-02 ENCOUNTER — Other Ambulatory Visit (HOSPITAL_COMMUNITY): Payer: Self-pay | Admitting: Urology

## 2011-07-02 DIAGNOSIS — C61 Malignant neoplasm of prostate: Secondary | ICD-10-CM

## 2011-07-13 ENCOUNTER — Encounter (HOSPITAL_COMMUNITY)
Admission: RE | Admit: 2011-07-13 | Discharge: 2011-07-13 | Disposition: A | Discharge status: Home / Self Care | Payer: Medicare Other | Source: Ambulatory Visit | Attending: Urology | Admitting: Urology

## 2011-07-13 DIAGNOSIS — C61 Malignant neoplasm of prostate: Secondary | ICD-10-CM

## 2011-07-13 MED ORDER — TECHNETIUM TC 99M MEDRONATE IV KIT
25.0000 | PACK | Freq: Once | INTRAVENOUS | Status: AC | PRN
  Administered 2011-07-13: 25 via INTRAVENOUS

## 2011-09-10 ENCOUNTER — Other Ambulatory Visit: Payer: Self-pay | Admitting: Diagnostic Neuroimaging

## 2011-09-10 DIAGNOSIS — R413 Other amnesia: Secondary | ICD-10-CM

## 2011-09-14 ENCOUNTER — Ambulatory Visit
Admission: RE | Admit: 2011-09-14 | Discharge: 2011-09-14 | Disposition: A | Discharge status: Home / Self Care | Payer: Medicare Other | Source: Ambulatory Visit | Attending: Diagnostic Neuroimaging | Admitting: Diagnostic Neuroimaging

## 2011-09-14 DIAGNOSIS — R413 Other amnesia: Secondary | ICD-10-CM

## 2011-09-18 ENCOUNTER — Encounter (HOSPITAL_COMMUNITY): Payer: Self-pay | Admitting: Emergency Medicine

## 2011-09-18 ENCOUNTER — Emergency Department (HOSPITAL_COMMUNITY)
Admission: EM | Admit: 2011-09-18 | Discharge: 2011-09-19 | Disposition: A | Discharge status: Home / Self Care | Payer: Medicare Other | Attending: Emergency Medicine | Admitting: Emergency Medicine

## 2011-09-18 DIAGNOSIS — T383X1A Poisoning by insulin and oral hypoglycemic [antidiabetic] drugs, accidental (unintentional), initial encounter: Secondary | ICD-10-CM | POA: Insufficient documentation

## 2011-09-18 DIAGNOSIS — E119 Type 2 diabetes mellitus without complications: Secondary | ICD-10-CM | POA: Insufficient documentation

## 2011-09-18 DIAGNOSIS — I10 Essential (primary) hypertension: Secondary | ICD-10-CM | POA: Insufficient documentation

## 2011-09-18 DIAGNOSIS — Z950 Presence of cardiac pacemaker: Secondary | ICD-10-CM | POA: Insufficient documentation

## 2011-09-18 DIAGNOSIS — T448X1A Poisoning by centrally-acting and adrenergic-neuron-blocking agents, accidental (unintentional), initial encounter: Secondary | ICD-10-CM | POA: Insufficient documentation

## 2011-09-18 DIAGNOSIS — T38801A Poisoning by unspecified hormones and synthetic substitutes, accidental (unintentional), initial encounter: Secondary | ICD-10-CM | POA: Insufficient documentation

## 2011-09-18 DIAGNOSIS — T50901A Poisoning by unspecified drugs, medicaments and biological substances, accidental (unintentional), initial encounter: Secondary | ICD-10-CM

## 2011-09-18 LAB — GLUCOSE, CAPILLARY

## 2011-09-18 NOTE — ED Provider Notes (Signed)
Medical screening examination/treatment/procedure(s) were conducted as a shared visit with non-physician practitioner(s) and myself.  I personally evaluated the patient during the encounter  Patient is awake alert no acute distress. No bradycardia. No hypotension.    Hanley Seamen, MD 09/18/11 [phone]

## 2011-09-18 NOTE — ED Notes (Signed)
Pt's CBG is 215. RN Thayer Ohm notified.

## 2011-09-18 NOTE — ED Provider Notes (Signed)
History     CSN: [id number]  Arrival date & time 09/18/11  2142   First MD Initiated Contact with Patient 09/18/11 2256      Chief Complaint  Patient presents with  . Drug Overdose    (Consider location/radiation/quality/duration/timing/severity/associated sxs/prior treatment) HPI Comments: Patient took his normal evening dose of atenolol 50 mg and Amaryl 4 mg at 5:30 with his meal.  He forgot that he take it and repeated this dosage at 9:30.  He states he feels fine, but family is concerned  Patient is a 78 y.o. male presenting with Overdose. The history is provided by the spouse.  Drug Overdose This is a new problem. The current episode started today. Pertinent negatives include no chest pain, chills or nausea. He has tried nothing for the symptoms.    Past Medical History  Diagnosis Date  . Hypertension   . Diabetes mellitus   . Pacemaker     Past Surgical History  Procedure Date  . Prostatectomy   . Coronary artery bypass graft     History reviewed. No pertinent family history.  History  Substance Use Topics  . Smoking status: Never Smoker   . Smokeless tobacco: Not on file  . Alcohol Use: No      Review of Systems  Constitutional: Negative for chills, activity change and appetite change.  Respiratory: Negative for shortness of breath.   Cardiovascular: Negative for chest pain and palpitations.  Gastrointestinal: Negative for nausea.  Neurological: Negative for dizziness.    Allergies  Iohexol  Home Medications   Current Outpatient Rx  Name Route Sig Dispense Refill  . AMLODIPINE BESYLATE 5 MG PO TABS Oral Take 5 mg by mouth daily.    . ATENOLOL 50 MG PO TABS Oral Take 50 mg by mouth daily.    . B COMPLEX PO TABS Oral Take 1 tablet by mouth daily.    Marland Kitchen VITAMIN D-3 PO Oral Take 1 tablet by mouth every morning.    Marland Kitchen CO-ENZYME Q-10 50 MG PO CAPS Oral Take 50 mg by mouth daily.    Marland Kitchen ESCITALOPRAM OXALATE 10 MG PO TABS Oral Take 10 mg by mouth daily.      Marland Kitchen FOLIC ACID 1 MG PO TABS Oral Take 1 mg by mouth daily.    Marland Kitchen GLIMEPIRIDE 4 MG PO TABS Oral Take 2-4 mg by mouth 2 (two) times daily. Take 4 mg in the morning and 2 mg at night    . OLMESARTAN MEDOXOMIL 40 MG PO TABS Oral Take 20 mg by mouth daily.    . OMEGA-3-ACID ETHYL ESTERS 1 G PO CAPS Oral Take 1 g by mouth every morning.    . SELENIUM 50 MCG PO TABS Oral Take 50 mcg by mouth daily.      BP 138/56  Pulse 60  Temp(Src) 97.4 F (36.3 C) (Oral)  Resp 16  SpO2 98%  Physical Exam  Constitutional: He is oriented to person, place, and time. He appears well-developed and well-nourished.  Eyes: Pupils are equal, round, and reactive to light.  Cardiovascular: Regular rhythm.   Pulmonary/Chest: Breath sounds normal.  Musculoskeletal: Normal range of motion.  Neurological: He is alert and oriented to person, place, and time.  Skin: Skin is warm and dry.    ED Course  Procedures (including critical care time)  Labs Reviewed  GLUCOSE, CAPILLARY - Abnormal; Notable for the following:    Glucose-Capillary 222 (*)    All other components within normal limits  GLUCOSE, CAPILLARY -  Abnormal; Notable for the following:    Glucose-Capillary 215 (*)    All other components within normal limits  GLUCOSE, CAPILLARY - Abnormal; Notable for the following:    Glucose-Capillary 155 (*)    All other components within normal limits  GLUCOSE, CAPILLARY - Abnormal; Notable for the following:    Glucose-Capillary 136 (*)    All other components within normal limits  GLUCOSE, CAPILLARY - Abnormal; Notable for the following:    Glucose-Capillary 132 (*)    All other components within normal limits   No results found.   1. Accidental drug overdose       MDM  At this time.  His blood sugar is 220 discussed with Dr. Maurice March. [place],  we agree that we will monitor patient until 1:30 2:00 in the morning with continuous cardiac monitoring and every hour blood sugar checks        Arman Filter, NP 09/18/11 2300  Arman Filter, NP 09/19/11 4098  Arman Filter, NP 09/19/11 0606  Arman Filter, NP 09/19/11 0606  Arman Filter, NP 09/19/11 [phone]

## 2011-09-18 NOTE — ED Notes (Addendum)
Pt states that he has took his regular dose of medication for this morning 50mg  of atenalol and then again around 17:30. Pt states that he forgot he took his atenolol and his diabetes meds and took them again at 21:30. Pt denies any shakey feeling, any cool clammyness. Pt denies light headedness or spinning.

## 2011-09-18 NOTE — ED Notes (Signed)
Patient complaining of overdose (double the dose) of his Atenolol and Amaryl this evening.  Patient takes medication twice a day (once in the morning and once at night); patient took once this morning and twice tonight.  Patient denies any symptoms at this time.

## 2011-09-19 LAB — GLUCOSE, CAPILLARY
Glucose-Capillary: 136 mg/dL — ABNORMAL HIGH (ref 70–99)
Glucose-Capillary: 132 mg/dL — ABNORMAL HIGH (ref 70–99)

## 2011-09-19 NOTE — Discharge Instructions (Signed)
Accidental Overdose  A drug overdose occurs when a chemical substance (drug or medication) is used in amounts large enough to overcome a person. This may result in severe illness or death. This is a type of poisoning. Accidental overdoses of medications or other substances come from a variety of reasons. When this happens accidentally, it is often because the person taking the substance does not know enough about what they have taken. Drugs which commonly cause overdose deaths are alcohol, psychotropic medications (medications which affect the mind), pain medications, illegal drugs (street drugs) such as cocaine and heroin, and multiple drugs taken at the same time. It may result from careless behavior (such as over-indulging at a party). Other causes of overdose may include multiple drug use, a lapse in memory, or drug use after a period of no drug use.   Sometimes overdosing occurs because a person cannot remember if they have taken their medication.   A common unintentional overdose in young children involves multi-vitamins containing iron. Iron is a part of the hemoglobin molecule in blood. It is used to transport oxygen to living cells. When taken in small amounts, iron allows the body to restock hemoglobin. In large amounts, it causes problems in the body. If this overdose is not treated, it can lead to death.  Never take medicines that show signs of tampering or do not seem quite right. Never take medicines in the dark or in poor lighting. Read the label and check each dose of medicine before you take it. When adults are poisoned, it happens most often through carelessness or lack of information. Taking medicines in the dark or taking medicine prescribed for someone else to treat the same type of problem is a dangerous practice.  SYMPTOMS   Symptoms of overdose depend on the medication and amount taken. They can vary from over-activity with stimulant over-dosage, to sleepiness from depressants such as  alcohol, narcotics and tranquilizers. Confusion, dizziness, nausea and vomiting may be present. If problems are severe enough coma and death may result.  DIAGNOSIS   Diagnosis and management are generally straightforward if the drug is known. Otherwise it is more difficult. At times, certain symptoms and signs exhibited by the patient, or blood tests, can reveal the drug in question.   TREATMENT   In an emergency department, most patients can be treated with supportive measures. Antidotes may be available if there has been an overdose of opioids or benzodiazepines. A rapid improvement will often occur if this is the cause of overdose.  At home or away from medical care:   There may be no immediate problems or warning signs in children.   Not everything works well in all cases of poisoning.   Take immediate action. Poisons may act quickly.   If you think someone has swallowed medicine or a household product, and the person is unconscious, having seizures (convulsions), or is not breathing, immediately call for an ambulance.  IF a person is conscious and appears to be doing OK but has swallowed a poison:   Do not wait to see what effect the poison will have. Immediately call a poison control center (listed in the white pages of your telephone book under "Poison Control" or inside the front cover with other emergency numbers). Some poison control centers have TTY capability for the deaf. Check with your local center if you or someone in your family requires this service.   Keep the container so you can read the label on the product for ingredients.     and condition of the person poisoned. Inform them if the person is vomiting, choking, drowsy, shows a change in color or temperature of skin, is conscious or unconscious, or is convulsing.   Do not cause vomiting unless instructed by medical personnel. Do not induce vomiting or force liquids into a  person who is convulsing, unconscious, or very drowsy.  Stay calm and in control.   Activated charcoal also is sometimes used in certain types of poisoning and you may wish to add a supply to your emergency medicines. It is available without a prescription. Call a poison control center before using this medication.  PREVENTION  Thousands of children die every year from unintentional poisoning. This may be from household chemicals, poisoning from carbon monoxide in a car, taking their parent's medications, or simply taking a few iron pills or vitamins with iron. Poisoning comes from unexpected sources.  Store medicines out of the sight and reach of children, preferably in a locked cabinet. Do not keep medications in a food cabinet. Always store your medicines in a secure place. Get rid of expired medications.   If you have children living with you or have them as occasional guests, you should have child-resistant caps on your medicine containers. Keep everything out of reach. Child proof your home.   If you are called to the telephone or to answer the door while you are taking a medicine, take the container with you or put the medicine out of the reach of small children.   Do not take your medication in front of children. Do not tell your child how good a medication is and how good it is for them. They may get the idea it is more of a treat.   If you are an adult and have accidentally taken an overdose, you need to consider how this happened and what can be done to prevent it from happening again. If this was from a street drug or alcohol, determine if there is a problem that needs addressing. If you are not sure a problems exists, it is easy to talk to a professional and ask them if they think you have a problem. It is better to handle this problem in this way before it happens again and has a much worse consequence.  Document Released: 10/02/2004 Document Revised: 03/31/2011 Document Reviewed:  03/10/2009 [facility] Patient Information 2012 [place], [place]. Please try to be very careful with your medications, keeping them on a regular scheduled basis.  Try not to put out more than 1 days medication at a time to avoid accidental overdose in the future You were monitored every night to your blood pressure pulse has remained stable.  Your blood sugar has fallen from 222 to 132 your last blood pressure reading is 138/56 pulse range has been in the 70'2 with oxygen saturation greater than 97%with clear breath sounds

## 2011-09-19 NOTE — ED Notes (Signed)
Pt's CBG is  136.RN Thayer Ohm notified.

## 2011-09-27 ENCOUNTER — Other Ambulatory Visit: Payer: Self-pay | Admitting: Ophthalmology

## 2011-09-27 NOTE — H&P (Signed)
  Pre-operative History and Physical for Ophthalmic Surgery  [name] 09/27/2011                  Chief Complaint: Decreased vision Left Eye  Diagnosis: Combined Cataract  Allergies not on file   Prior to Admission medications   Not on File    Planned Procedure:                                      Phacoemulsification, Posterior Chamber Intra-ocular Lens                                      Acrysof MA 50 BM +14.50 Left Eye  There were no vitals filed for this visit.  Pulse: 70        Temp: NA        Resp:  18       ROS: no complaints apart form vision No past medical history on file.  No past surgical history on file.   History   Social History  . Marital Status: Unknown    Spouse Name: N/A    Number of Children: N/A  . Years of Education: N/A   Occupational History  . Not on file.   Social History Main Topics  . Smoking status: Not on file  . Smokeless tobacco: Not on file  . Alcohol Use: Not on file  . Drug Use: Not on file  . Sexually Active: Not on file   Other Topics Concern  . Not on file   Social History Narrative  . No narrative on file     The following examination is for anesthesia clearance for minimally invasive Ophthalmic surgery. It is primarily to document heart and lung findings and is not intended to elucidate unknown general medical conditions inclusive of abdominal masses, lung lesions, etc.   General Constitution:  Good, stable, Within Normal Limits   Alertness/Orientation:  Person, time place     yes   HEENT:  Eye Findings: Combined Cataract OS                   left eye  Neck: supple without masses  Chest/Lungs: clear to auscultation  Cardiac: Normal S1 and S2 without Murmur, S3 or S4, pacemeaker, regular rhythm  Neuro: non-focal   Impression: Combined Cataract OS, visually significant  Planned Procedure:  Phacoemulsification, Posterior Chamber Intraocular Lens    Shade Flood, MD

## 2011-10-07 DIAGNOSIS — R4189 Other symptoms and signs involving cognitive functions and awareness: Secondary | ICD-10-CM | POA: Insufficient documentation

## 2011-10-08 ENCOUNTER — Other Ambulatory Visit (HOSPITAL_COMMUNITY): Payer: Self-pay

## 2011-10-20 ENCOUNTER — Ambulatory Visit: Admit: 2011-10-20 | Payer: Self-pay | Admitting: Ophthalmology

## 2011-10-20 SURGERY — PHACOEMULSIFICATION, CATARACT, WITH IOL INSERTION
Anesthesia: Monitor Anesthesia Care | Laterality: Left

## 2011-11-04 ENCOUNTER — Encounter (HOSPITAL_COMMUNITY): Payer: Self-pay | Admitting: Pharmacy Technician

## 2011-11-08 NOTE — H&P (Signed)
Pre-operative History and Physical for Ophthalmic Surgery  [name] 11/08/2011                  Chief Complaint: Decreased vision right eye Diagnosis: Cataract Right Eye  Allergies  Allergen Reactions  . Iohexol Anaphylaxis     Desc: RN states pt stated he had anyphalactic shock reaction to contrast approx 10 years ago.      Prior to Admission medications   Medication Sig Start Date End Date Taking? Authorizing Provider  amLODipine (NORVASC) 5 MG tablet Take 5 mg by mouth daily.    Historical Provider, MD  atenolol (TENORMIN) 50 MG tablet Take 50 mg by mouth 2 (two) times daily.     Historical Provider, MD  b complex vitamins tablet Take 1 tablet by mouth daily.    Historical Provider, MD  Cholecalciferol 2000 UNITS TABS Take 2,000 Units by mouth daily.    Historical Provider, MD  co-enzyme Q-10 50 MG capsule Take 50 mg by mouth daily.    Historical Provider, MD  donepezil (ARICEPT) 10 MG tablet Take 10 mg by mouth at bedtime.    Historical Provider, MD  escitalopram (LEXAPRO) 20 MG tablet Take 20 mg by mouth daily.    Historical Provider, MD  folic acid (FOLVITE) 1 MG tablet Take 1 mg by mouth daily.    Historical Provider, MD  glimepiride (AMARYL) 4 MG tablet Take 2 mg by mouth every evening.     Historical Provider, MD  linagliptin (TRADJENTA) 5 MG TABS tablet Take 5 mg by mouth every morning.    Historical Provider, MD  olmesartan (BENICAR) 40 MG tablet Take 20 mg by mouth daily.    Historical Provider, MD  omega-3 acid ethyl esters (LOVAZA) 1 G capsule Take 1 g by mouth every morning.    Historical Provider, MD  OVER THE COUNTER MEDICATION Take 1 tablet by mouth 3 (three) times daily. BACOPA complex    Historical Provider, MD  selenium 50 MCG TABS Take 50 mcg by mouth daily.    Historical Provider, MD  Turmeric Curcumin 500 MG CAPS Take 500 mg by mouth daily.    Historical Provider, MD    Planned Procedure:                                 Phacoemulsification,  Posterior Chamber Intra-ocular Lens Right eye                                Acrysof  MA50BM  + 14.00 Diopter for posterior chamber  implant OD  There were no vitals filed for this visit.  Pulse: 76         Temp: NA       Resp:  18    ROS:non contributory  Past Medical History  Diagnosis Date  . Hypertension   . Diabetes mellitus   . Pacemaker     Past Surgical History  Procedure Date  . Prostatectomy   . Coronary artery bypass graft      History   Social History  . Marital Status: Married    Spouse Name: N/A    Number of Children: N/A  . Years of Education: N/A   Occupational History  . Not on file.   Social History Main Topics  . Smoking status: Never Smoker   . Smokeless tobacco: Not on file  .  Alcohol Use: No  . Drug Use: No  . Sexually Active:    Other Topics Concern  . Not on file   Social History Narrative  . No narrative on file     The following examination is for anesthesia clearance for minimally invasive Ophthalmic surgery. It is primarily to document heart and lung findings and is not intended to elucidate unknown general medical conditions inclusive of abdominal masses, lung lesions, etc.   General Constitution:  Within Normal Limits   Alertness/Orientation:  Person, time place     yes   HEENT:  Eye Findings: Cataract OD                   right eye  Neck: supple without masses  Chest/Lungs: clear to auscultation  Cardiac: Normal S1 and S2 without Murmur, S3 or S4  Neuro: non-focal   Impression: Cataract  Right Eye  Planned Procedure:  Phacoemulsification, Posterior Chamber Intraocular Lens     Shade Flood, MD

## 2011-11-12 ENCOUNTER — Inpatient Hospital Stay (HOSPITAL_COMMUNITY): Admission: RE | Admit: 2011-11-12 | Payer: Medicare Other | Source: Ambulatory Visit

## 2011-11-16 ENCOUNTER — Encounter (HOSPITAL_COMMUNITY)
Admission: RE | Admit: 2011-11-16 | Discharge: 2011-11-16 | Disposition: A | Discharge status: Home / Self Care | Payer: Medicare Other | Source: Ambulatory Visit | Attending: Ophthalmology | Admitting: Ophthalmology

## 2011-11-16 MED ORDER — PHENYLEPHRINE HCL 2.5 % OP SOLN: 1.0000 [drp] | OPHTHALMIC | Status: DC | PRN

## 2011-11-16 MED ORDER — GATIFLOXACIN 0.5 % OP SOLN: 1.0000 [drp] | OPHTHALMIC | Status: DC | PRN

## 2011-11-16 MED ORDER — TETRACAINE HCL 0.5 % OP SOLN: 2.0000 [drp] | OPHTHALMIC | Status: DC

## 2011-11-16 MED ORDER — PREDNISOLONE ACETATE 1 % OP SUSP: 1.0000 [drp] | OPHTHALMIC | Status: DC

## 2011-11-16 NOTE — Progress Notes (Signed)
Dr Clarisa Kindred called re: verification of order for cataract surgery. Pt states the left eye is to be operated on.; the OR schedule and Dr Anne Hahn consent state Right eye. Dr Clarisa Kindred was not able to verify order and asked me to call Yolanda at t he office. Yolanda verified the left eye via computer record at the office. At this point, the pt's spouse voiced concern re: laterality discrepancy and confusion. She does not want to continue the visit and states she is canceling this surgery. She states she does not want her husband to have the eye surgery with Dr. Clarisa Kindred. Dr Clarisa Kindred notified and will follow up with patient.

## 2011-11-17 ENCOUNTER — Ambulatory Visit (HOSPITAL_COMMUNITY): Admission: RE | Admit: 2011-11-17 | Payer: Medicare Other | Source: Ambulatory Visit | Admitting: Ophthalmology

## 2011-11-17 ENCOUNTER — Encounter (HOSPITAL_COMMUNITY): Admission: RE | Payer: Self-pay | Source: Ambulatory Visit

## 2011-11-17 SURGERY — PHACOEMULSIFICATION, CATARACT, WITH IOL INSERTION
Anesthesia: Monitor Anesthesia Care | Laterality: Right

## 2012-01-10 ENCOUNTER — Other Ambulatory Visit (HOSPITAL_COMMUNITY): Payer: Self-pay | Admitting: Urology

## 2012-01-10 DIAGNOSIS — C61 Malignant neoplasm of prostate: Secondary | ICD-10-CM

## 2012-01-17 ENCOUNTER — Encounter (HOSPITAL_COMMUNITY): Payer: Self-pay

## 2012-01-17 ENCOUNTER — Emergency Department (HOSPITAL_COMMUNITY)
Admission: EM | Admit: 2012-01-17 | Discharge: 2012-01-17 | Disposition: A | Discharge status: Home / Self Care | Payer: Medicare Other | Attending: Emergency Medicine | Admitting: Emergency Medicine

## 2012-01-17 DIAGNOSIS — E119 Type 2 diabetes mellitus without complications: Secondary | ICD-10-CM | POA: Insufficient documentation

## 2012-01-17 DIAGNOSIS — Z950 Presence of cardiac pacemaker: Secondary | ICD-10-CM | POA: Insufficient documentation

## 2012-01-17 DIAGNOSIS — Z91041 Radiographic dye allergy status: Secondary | ICD-10-CM | POA: Insufficient documentation

## 2012-01-17 DIAGNOSIS — Z951 Presence of aortocoronary bypass graft: Secondary | ICD-10-CM | POA: Insufficient documentation

## 2012-01-17 DIAGNOSIS — M549 Dorsalgia, unspecified: Secondary | ICD-10-CM | POA: Insufficient documentation

## 2012-01-17 DIAGNOSIS — I10 Essential (primary) hypertension: Secondary | ICD-10-CM | POA: Insufficient documentation

## 2012-01-17 DIAGNOSIS — Z79899 Other long term (current) drug therapy: Secondary | ICD-10-CM | POA: Insufficient documentation

## 2012-01-17 DIAGNOSIS — R079 Chest pain, unspecified: Secondary | ICD-10-CM

## 2012-01-17 LAB — BASIC METABOLIC PANEL
CO2: 23 mEq/L (ref 19–32)
GFR calc non Af Amer: 28 mL/min — ABNORMAL LOW (ref >90)
Glucose, Bld: 82 mg/dL (ref 70–99)
Potassium: 4 mEq/L (ref 3.5–5.1)
Sodium: 144 mEq/L (ref 135–145)

## 2012-01-17 LAB — GLUCOSE, CAPILLARY

## 2012-01-17 LAB — CBC
Hemoglobin: 13.6 g/dL (ref 13.0–17.0)
RBC: 4.54 MIL/uL (ref 4.22–5.81)

## 2012-01-17 LAB — POCT I-STAT TROPONIN I

## 2012-01-17 NOTE — ED Notes (Signed)
Pt demands to have something to drink. fsbs 51 Dr Effie Shy informed. Ok to give OJ.

## 2012-01-17 NOTE — ED Notes (Signed)
Pt states he was getting ready to eat dinner and began having 7/10 dull right chest pain into back. Denies sob or nausea. ems gave o2 and 1 nitro sl. Pt states pain is resolved at this time.

## 2012-01-17 NOTE — Discharge Instructions (Signed)
For future episodes of chest pain, try Tylenol or Maalox. Followup with your doctor in one week.   Back Pain, Adult Low back pain is very common. About 1 in 5 people have back pain.The cause of low back pain is rarely dangerous. The pain often gets better over time.About half of people with a sudden onset of back pain feel better in just 2 weeks. About 8 in 10 people feel better by 6 weeks.  CAUSES Some common causes of back pain include:  Strain of the muscles or ligaments supporting the spine.   Wear and tear (degeneration) of the spinal discs.   Arthritis.   Direct injury to the back.  DIAGNOSIS Most of the time, the direct cause of low back pain is not known.However, back pain can be treated effectively even when the exact cause of the pain is unknown.Answering your caregiver's questions about your overall health and symptoms is one of the most accurate ways to make sure the cause of your pain is not dangerous. If your caregiver needs more information, he or she may order lab work or imaging tests (X-rays or MRIs).However, even if imaging tests show changes in your back, this usually does not require surgery. HOME CARE INSTRUCTIONS For many people, back pain returns.Since low back pain is rarely dangerous, it is often a condition that people can learn to [facility] their own.   Remain active. It is stressful on the back to sit or stand in one place. Do not sit, drive, or stand in one place for more than 30 minutes at a time. Take short walks on level surfaces as soon as pain allows.Try to increase the length of time you walk each day.   Do not stay in bed.Resting more than 1 or 2 days can delay your recovery.   Do not avoid exercise or work.Your body is made to move.It is not dangerous to be active, even though your back may hurt.Your back will likely heal faster if you return to being active before your pain is gone.   Pay attention to your body when you bend and lift. Many  people have less discomfortwhen lifting if they bend their knees, keep the load close to their bodies,and avoid twisting. Often, the most comfortable positions are those that put less stress on your recovering back.   Find a comfortable position to sleep. Use a firm mattress and lie on your side with your knees slightly bent. If you lie on your back, put a pillow under your knees.   Only take over-the-counter or prescription medicines as directed by your caregiver. Over-the-counter medicines to reduce pain and inflammation are often the most helpful.Your caregiver may prescribe muscle relaxant drugs.These medicines help dull your pain so you can more quickly return to your normal activities and healthy exercise.   Put ice on the injured area.   Put ice in a plastic bag.   Place a towel between your skin and the bag.   Leave the ice on for 15 to 20 minutes, 3 to 4 times a day for the first 2 to 3 days. After that, ice and heat may be alternated to reduce pain and spasms.   Ask your caregiver about trying back exercises and gentle massage. This may be of some benefit.   Avoid feeling anxious or stressed.Stress increases muscle tension and can worsen back pain.It is important to recognize when you are anxious or stressed and learn ways to manage it.Exercise is a great option.  SEEK  MEDICAL CARE IF:  You have pain that is not relieved with rest or medicine.   You have pain that does not improve in 1 week.   You have new symptoms.   You are generally not feeling well.  SEEK IMMEDIATE MEDICAL CARE IF:   You have pain that radiates from your back into your legs.   You develop new bowel or bladder control problems.   You have unusual weakness or numbness in your arms or legs.   You develop nausea or vomiting.   You develop abdominal pain.   You feel faint.  Document Released: 07/19/2005 Document Revised: 07/08/2011 Document Reviewed: 12/07/2010 [facility] Patient Information  2012 [place], [place].Pain of Unknown Etiology (Pain Without a Known Cause) You have come to your caregiver because of pain. Pain can occur in any part of the body. Often there is not a definite cause. If your laboratory (blood or urine) work was normal and x-rays or other studies were normal, your caregiver may treat you without knowing the cause of the pain. An example of this is the headache. Most headaches are diagnosed by taking a history. This means your caregiver asks you questions about your headaches. Your caregiver determines a treatment based on your answers. Usually testing done for headaches is normal. Often testing is not done unless there is no response to medications. Regardless of where your pain is located today, you can be given medications to make you comfortable. If no physical cause of pain can be found, most cases of pain will gradually leave as suddenly as they came.  If you have a painful condition and no reason can be found for the pain, It is importantthat you follow up with your caregiver. If the pain becomes worse or does not go away, it may be necessary to repeat tests and look further for a possible cause.  Only take over-the-counter or prescription medicines for pain, discomfort, or fever as directed by your caregiver.   For the protection of your privacy, test results can not be given over the phone. Make sure you receive the results of your test. Ask as to how these results are to be obtained if you have not been informed. It is your responsibility to obtain your test results.   You may continue all activities unless the activities cause more pain. When the pain lessens, it is important to gradually resume normal activities. Resume activities by beginning slowly and gradually increasing the intensity and duration of the activities or exercise. During periods of severe pain, bed-rest may be helpful. Lay or sit in any position that is comfortable.   Ice used for acute (sudden)  conditions may be effective. Use a large plastic bag filled with ice and wrapped in a towel. This may provide pain relief.   See your caregiver for continued problems. They can help or refer you for exercises or physical therapy if necessary.  If you were given medications for your condition, do not drive, operate machinery or power tools, or sign legal documents for 24 hours. Do not drink alcohol, take sleeping pills, or take other medications that may interfere with treatment. See your caregiver immediately if you have pain that is becoming worse and not relieved by medications. Document Released: 04/13/2001 Document Revised: 07/08/2011 Document Reviewed: 07/19/2005 [facility] Patient Information 2012 [place], [place].

## 2012-01-17 NOTE — ED Provider Notes (Signed)
History     CSN: [id number]  Arrival date & time 01/17/12  7829   First MD Initiated Contact with Patient 01/17/12 1943      Chief Complaint  Patient presents with  . Chest Pain    (Consider location/radiation/quality/duration/timing/severity/associated sxs/prior treatment) HPI Comments: [name] is a 78 y.o. male who complains of left upper back, and left anterior chest pain. That started prior to eating dinner tonight. His wife gave him 2 aspirin. He was transported by EMS, who gave him a single nitroglycerin. On arrival here. He is pain-free. He has not had this problem previously. He follows with his cardiologist and  PCP regularly. He saw a gerontologist last week, and was prescribed an unknown medicine for lethargy. He's not had any recent fever, chills, nausea, vomiting, cough, shortness of breath, or new weakness. There are no palliative or aggravating factors.  Patient is a 78 y.o. male presenting with chest pain. The history is provided by the patient.  Chest Pain     Past Medical History  Diagnosis Date  . Hypertension   . Diabetes mellitus   . Pacemaker     Past Surgical History  Procedure Date  . Prostatectomy   . Coronary artery bypass graft     History reviewed. No pertinent family history.  History  Substance Use Topics  . Smoking status: Never Smoker   . Smokeless tobacco: Not on file  . Alcohol Use: No      Review of Systems  Cardiovascular: Positive for chest pain.  All other systems reviewed and are negative.    Allergies  Iohexol  Home Medications   Current Outpatient Rx  Name Route Sig Dispense Refill  . AMLODIPINE BESYLATE 5 MG PO TABS Oral Take 5 mg by mouth daily.    . ATENOLOL 50 MG PO TABS Oral Take 50 mg by mouth 2 (two) times daily.     . B COMPLEX PO TABS Oral Take 1 tablet by mouth daily.    . CHOLECALCIFEROL 2000 UNITS PO TABS Oral Take 2,000 Units by mouth daily.    Marland Kitchen CO-ENZYME Q-10 50 MG PO CAPS Oral Take 50 mg  by mouth daily.    Marland Kitchen ESCITALOPRAM OXALATE 20 MG PO TABS Oral Take 20 mg by mouth daily.    Marland Kitchen FOLIC ACID 1 MG PO TABS Oral Take 1 mg by mouth daily.    . FUROSEMIDE 20 MG PO TABS Oral Take 20 mg by mouth every other day.    Marland Kitchen GLIMEPIRIDE 4 MG PO TABS Oral Take 4 mg by mouth every evening.     Marland Kitchen LINAGLIPTIN 5 MG PO TABS Oral Take 5 mg by mouth every morning.    Marland Kitchen MEMANTINE HCL 10 MG PO TABS Oral Take 20 mg by mouth daily.    Marland Kitchen OLMESARTAN MEDOXOMIL 40 MG PO TABS Oral Take 20 mg by mouth daily.    . OMEGA-3-ACID ETHYL ESTERS 1 G PO CAPS Oral Take 1 g by mouth every morning.    . SELENIUM 50 MCG PO TABS Oral Take 50 mcg by mouth daily.    . TURMERIC CURCUMIN 500 MG PO CAPS Oral Take 500 mg by mouth daily.      BP 158/92  Pulse 65  Temp 98.7 F (37.1 C)  Resp 18  SpO2 98%  Physical Exam  Nursing note and vitals reviewed. Constitutional: He is oriented to person, place, and time. He appears well-developed. No distress.       frail  HENT:  Head: Normocephalic and atraumatic.  Right Ear: External ear normal.  Left Ear: External ear normal.  Eyes: Conjunctivae and EOM are normal. Pupils are equal, round, and reactive to light.  Neck: Normal range of motion and phonation normal. Neck supple.  Cardiovascular: Normal rate, regular rhythm, normal heart sounds and intact distal pulses.   Pulmonary/Chest: Effort normal and breath sounds normal. He exhibits no bony tenderness.  Abdominal: Soft. Normal appearance. There is no tenderness.  Musculoskeletal: Normal range of motion.  Neurological: He is alert and oriented to person, place, and time. He has normal strength. No cranial nerve deficit or sensory deficit. He exhibits normal muscle tone. Coordination normal.  Skin: Skin is warm, dry and intact.  Psychiatric: His behavior is normal.    ED Course  Procedures (including critical care time)   Date: 01/17/2012  Rate:70  Rhythm: V paced QRS Axis:   Intervals: normal QT  ST/T Wave  abnormalities: Normal ST  Conduction Disutrbances: NA  Narrative Interpretation:   Old EKG Reviewed: unchanged    Labs Reviewed  CBC - Abnormal; Notable for the following:    HCT 38.4 (*)     All other components within normal limits  BASIC METABOLIC PANEL - Abnormal; Notable for the following:    BUN 25 (*)     Creatinine, Ser 2.04 (*)     GFR calc non Af Amer 28 (*)     GFR calc Af Amer 32 (*)     All other components within normal limits  GLUCOSE, CAPILLARY - Abnormal; Notable for the following:    Glucose-Capillary 69 (*)     All other components within normal limits  POCT I-STAT TROPONIN I  LAB REPORT - SCANNED   No results found.   1. Back pain   2. Chest pain       MDM  Nonspecific transient chest and back discomfort. Doubt ACS, PE, pneumonia, occult infection, or metabolic instability.  Patient stable for discharge   Plan: Home Medications- usual plus Tylenol and Maalox prn; Home Treatments- rest; Recommended follow up- PCP in 1 week    Flint Melter, MD 01/18/12 1941

## 2012-01-19 ENCOUNTER — Encounter (HOSPITAL_COMMUNITY)
Admission: RE | Admit: 2012-01-19 | Discharge: 2012-01-19 | Disposition: A | Discharge status: Home / Self Care | Payer: Medicare Other | Source: Ambulatory Visit | Attending: Urology | Admitting: Urology

## 2012-01-19 DIAGNOSIS — R972 Elevated prostate specific antigen [PSA]: Secondary | ICD-10-CM | POA: Insufficient documentation

## 2012-01-19 DIAGNOSIS — C61 Malignant neoplasm of prostate: Secondary | ICD-10-CM

## 2012-01-19 MED ORDER — TECHNETIUM TC 99M MEDRONATE IV KIT
25.0000 | PACK | Freq: Once | INTRAVENOUS | Status: AC | PRN
Start: 2012-01-19 — End: 2012-01-19
  Administered 2012-01-19: 25 via INTRAVENOUS

## 2012-03-30 ENCOUNTER — Ambulatory Visit: Payer: Medicare Other | Attending: Internal Medicine | Admitting: Physical Therapy

## 2012-03-30 DIAGNOSIS — R269 Unspecified abnormalities of gait and mobility: Secondary | ICD-10-CM | POA: Insufficient documentation

## 2012-03-30 DIAGNOSIS — IMO0001 Reserved for inherently not codable concepts without codable children: Secondary | ICD-10-CM | POA: Insufficient documentation

## 2012-03-31 ENCOUNTER — Encounter: Payer: Medicare Other | Admitting: Rehabilitative and Restorative Service Providers"

## 2012-04-12 ENCOUNTER — Ambulatory Visit: Payer: Medicare Other | Attending: Internal Medicine | Admitting: Physical Therapy

## 2012-04-12 DIAGNOSIS — IMO0001 Reserved for inherently not codable concepts without codable children: Secondary | ICD-10-CM | POA: Insufficient documentation

## 2012-04-12 DIAGNOSIS — R269 Unspecified abnormalities of gait and mobility: Secondary | ICD-10-CM | POA: Insufficient documentation

## 2012-04-14 ENCOUNTER — Ambulatory Visit: Payer: Medicare Other | Admitting: Physical Therapy

## 2012-04-17 ENCOUNTER — Ambulatory Visit: Payer: Medicare Other | Admitting: Physical Therapy

## 2012-04-18 ENCOUNTER — Inpatient Hospital Stay (HOSPITAL_COMMUNITY)
Admission: AD | Admit: 2012-04-18 | Discharge: 2012-04-20 | DRG: 254 | Disposition: A | Discharge status: Home / Self Care | Payer: Medicare Other | Source: Ambulatory Visit | Attending: Internal Medicine | Admitting: Internal Medicine

## 2012-04-18 ENCOUNTER — Ambulatory Visit (HOSPITAL_COMMUNITY)
Admission: RE | Admit: 2012-04-18 | Discharge: 2012-04-18 | Disposition: A | Discharge status: Home / Self Care | Payer: Medicare Other | Source: Ambulatory Visit | Attending: Internal Medicine | Admitting: Internal Medicine

## 2012-04-18 ENCOUNTER — Other Ambulatory Visit: Payer: Self-pay | Admitting: Internal Medicine

## 2012-04-18 VITALS — BP 139/53

## 2012-04-18 DIAGNOSIS — E785 Hyperlipidemia, unspecified: Secondary | ICD-10-CM

## 2012-04-18 DIAGNOSIS — I10 Essential (primary) hypertension: Secondary | ICD-10-CM

## 2012-04-18 DIAGNOSIS — Z888 Allergy status to other drugs, medicaments and biological substances status: Secondary | ICD-10-CM

## 2012-04-18 DIAGNOSIS — I2510 Atherosclerotic heart disease of native coronary artery without angina pectoris: Secondary | ICD-10-CM

## 2012-04-18 DIAGNOSIS — I824Z9 Acute embolism and thrombosis of unspecified deep veins of unspecified distal lower extremity: Secondary | ICD-10-CM | POA: Diagnosis present

## 2012-04-18 DIAGNOSIS — R627 Adult failure to thrive: Secondary | ICD-10-CM | POA: Diagnosis present

## 2012-04-18 DIAGNOSIS — I82402 Acute embolism and thrombosis of unspecified deep veins of left lower extremity: Secondary | ICD-10-CM

## 2012-04-18 DIAGNOSIS — N189 Chronic kidney disease, unspecified: Secondary | ICD-10-CM

## 2012-04-18 DIAGNOSIS — Z9079 Acquired absence of other genital organ(s): Secondary | ICD-10-CM

## 2012-04-18 DIAGNOSIS — Z950 Presence of cardiac pacemaker: Secondary | ICD-10-CM

## 2012-04-18 DIAGNOSIS — R413 Other amnesia: Secondary | ICD-10-CM | POA: Diagnosis present

## 2012-04-18 DIAGNOSIS — I824Y9 Acute embolism and thrombosis of unspecified deep veins of unspecified proximal lower extremity: Principal | ICD-10-CM | POA: Diagnosis present

## 2012-04-18 DIAGNOSIS — R609 Edema, unspecified: Secondary | ICD-10-CM

## 2012-04-18 DIAGNOSIS — C61 Malignant neoplasm of prostate: Secondary | ICD-10-CM

## 2012-04-18 DIAGNOSIS — I129 Hypertensive chronic kidney disease with stage 1 through stage 4 chronic kidney disease, or unspecified chronic kidney disease: Secondary | ICD-10-CM | POA: Diagnosis present

## 2012-04-18 DIAGNOSIS — E119 Type 2 diabetes mellitus without complications: Secondary | ICD-10-CM

## 2012-04-18 DIAGNOSIS — Z79899 Other long term (current) drug therapy: Secondary | ICD-10-CM

## 2012-04-18 DIAGNOSIS — M79609 Pain in unspecified limb: Secondary | ICD-10-CM

## 2012-04-18 DIAGNOSIS — M7989 Other specified soft tissue disorders: Secondary | ICD-10-CM

## 2012-04-18 DIAGNOSIS — I82409 Acute embolism and thrombosis of unspecified deep veins of unspecified lower extremity: Secondary | ICD-10-CM

## 2012-04-18 DIAGNOSIS — Z951 Presence of aortocoronary bypass graft: Secondary | ICD-10-CM

## 2012-04-18 DIAGNOSIS — M79606 Pain in leg, unspecified: Secondary | ICD-10-CM

## 2012-04-18 LAB — BASIC METABOLIC PANEL
BUN: 33 mg/dL — ABNORMAL HIGH (ref 6–23)
Calcium: 10.6 mg/dL — ABNORMAL HIGH (ref 8.4–10.5)
Chloride: 105 mEq/L (ref 96–112)
Creatinine, Ser: 2.06 mg/dL — ABNORMAL HIGH (ref 0.50–1.35)
GFR calc Af Amer: 32 mL/min — ABNORMAL LOW (ref >90)
GFR calc non Af Amer: 28 mL/min — ABNORMAL LOW (ref >90)

## 2012-04-18 LAB — GLUCOSE, CAPILLARY: Glucose-Capillary: 93 mg/dL (ref 70–99)

## 2012-04-18 MED ORDER — LINAGLIPTIN 5 MG PO TABS
5.0000 mg | ORAL_TABLET | Freq: Every morning | ORAL | Status: DC
  Administered 2012-04-19 – 2012-04-20 (×2): 5 mg via ORAL
  Filled 2012-04-18 (×2): qty 1

## 2012-04-18 MED ORDER — ATENOLOL 50 MG PO TABS
50.0000 mg | ORAL_TABLET | Freq: Two times a day (BID) | ORAL | Status: DC
  Administered 2012-04-18 – 2012-04-20 (×4): 50 mg via ORAL
  Filled 2012-04-18 (×5): qty 1

## 2012-04-18 MED ORDER — ACETAMINOPHEN 650 MG RE SUPP: 650.0000 mg | Freq: Four times a day (QID) | RECTAL | Status: DC | PRN

## 2012-04-18 MED ORDER — MEMANTINE HCL 10 MG PO TABS
20.0000 mg | ORAL_TABLET | Freq: Every day | ORAL | Status: DC
  Administered 2012-04-19 – 2012-04-20 (×2): 20 mg via ORAL
  Filled 2012-04-18 (×3): qty 2

## 2012-04-18 MED ORDER — GLIMEPIRIDE 4 MG PO TABS
4.0000 mg | ORAL_TABLET | Freq: Every evening | ORAL | Status: DC
  Filled 2012-04-18 (×3): qty 1

## 2012-04-18 MED ORDER — ONDANSETRON HCL 4 MG PO TABS: 4.0000 mg | ORAL_TABLET | Freq: Four times a day (QID) | ORAL | Status: DC | PRN

## 2012-04-18 MED ORDER — ESCITALOPRAM OXALATE 20 MG PO TABS
20.0000 mg | ORAL_TABLET | Freq: Every day | ORAL | Status: DC
  Administered 2012-04-19 – 2012-04-20 (×2): 20 mg via ORAL
  Filled 2012-04-18 (×3): qty 1

## 2012-04-18 MED ORDER — OMEGA-3-ACID ETHYL ESTERS 1 G PO CAPS
1.0000 g | ORAL_CAPSULE | Freq: Every morning | ORAL | Status: DC
  Administered 2012-04-19 – 2012-04-20 (×2): 1 g via ORAL
  Filled 2012-04-18 (×2): qty 1

## 2012-04-18 MED ORDER — ENOXAPARIN SODIUM 80 MG/0.8ML ~~LOC~~ SOLN
70.0000 mg | SUBCUTANEOUS | Status: DC
  Administered 2012-04-19: 70 mg via SUBCUTANEOUS
  Filled 2012-04-18 (×2): qty 0.8

## 2012-04-18 MED ORDER — AMLODIPINE BESYLATE 5 MG PO TABS
5.0000 mg | ORAL_TABLET | Freq: Every day | ORAL | Status: DC
  Administered 2012-04-19 – 2012-04-20 (×2): 5 mg via ORAL
  Filled 2012-04-18 (×3): qty 1

## 2012-04-18 MED ORDER — VITAMIN D3 25 MCG (1000 UNIT) PO TABS
2000.0000 [IU] | ORAL_TABLET | Freq: Every day | ORAL | Status: DC
  Administered 2012-04-19 – 2012-04-20 (×2): 2000 [IU] via ORAL
  Filled 2012-04-18 (×3): qty 2

## 2012-04-18 MED ORDER — SENNOSIDES-DOCUSATE SODIUM 8.6-50 MG PO TABS: 1.0000 | ORAL_TABLET | Freq: Every evening | ORAL | Status: DC | PRN

## 2012-04-18 MED ORDER — ENOXAPARIN SODIUM 80 MG/0.8ML ~~LOC~~ SOLN
70.0000 mg | Freq: Once | SUBCUTANEOUS | Status: AC
  Administered 2012-04-18: 70 mg via SUBCUTANEOUS
  Filled 2012-04-18: qty 0.8

## 2012-04-18 MED ORDER — CHOLECALCIFEROL 50 MCG (2000 UT) PO TABS: 2000.0000 [IU] | ORAL_TABLET | Freq: Every day | ORAL | Status: DC

## 2012-04-18 MED ORDER — SODIUM CHLORIDE 0.9 % IJ SOLN: 3.0000 mL | Freq: Two times a day (BID) | INTRAMUSCULAR | Status: DC

## 2012-04-18 MED ORDER — ONDANSETRON HCL 4 MG/2ML IJ SOLN
4.0000 mg | Freq: Four times a day (QID) | INTRAMUSCULAR | Status: DC | PRN
  Filled 2012-04-18: qty 2

## 2012-04-18 MED ORDER — FUROSEMIDE 20 MG PO TABS
20.0000 mg | ORAL_TABLET | ORAL | Status: DC
  Administered 2012-04-19: 20 mg via ORAL
  Filled 2012-04-18: qty 1

## 2012-04-18 MED ORDER — IRBESARTAN 75 MG PO TABS
75.0000 mg | ORAL_TABLET | Freq: Every day | ORAL | Status: DC
  Administered 2012-04-18 – 2012-04-20 (×3): 75 mg via ORAL
  Filled 2012-04-18 (×3): qty 1

## 2012-04-18 MED ORDER — OXYCODONE HCL 5 MG PO TABS: 5.0000 mg | ORAL_TABLET | ORAL | Status: DC | PRN

## 2012-04-18 MED ORDER — SODIUM CHLORIDE 0.9 % IJ SOLN
3.0000 mL | INTRAMUSCULAR | Status: DC | PRN
  Administered 2012-04-19: 3 mL via INTRAVENOUS

## 2012-04-18 MED ORDER — ENOXAPARIN SODIUM 40 MG/0.4ML ~~LOC~~ SOLN: 40.0000 mg | SUBCUTANEOUS | Status: DC

## 2012-04-18 MED ORDER — SODIUM CHLORIDE 0.9 % IV SOLN: 250.0000 mL | INTRAVENOUS | Status: DC | PRN

## 2012-04-18 MED ORDER — ACETAMINOPHEN 325 MG PO TABS: 650.0000 mg | ORAL_TABLET | Freq: Four times a day (QID) | ORAL | Status: DC | PRN

## 2012-04-18 MED ORDER — FOLIC ACID 1 MG PO TABS
1.0000 mg | ORAL_TABLET | Freq: Every day | ORAL | Status: DC
Start: 2012-04-18 — End: 2012-04-20
  Administered 2012-04-19 – 2012-04-20 (×2): 1 mg via ORAL
  Filled 2012-04-18 (×3): qty 1

## 2012-04-18 NOTE — H&P (Signed)
[facility]          History and Physical    PCP:   Katy Apo, MD   Chief Complaint:  LLE swelling  HPI: 78 y/o man with h/o CAD, HTN, HLD, DM, prostate cancer went to see his PCP Dr. Valentina Lucks 2/2 leg edema that began yesterday. He was sent for a doppler that was positive for deep vein thrombosis involving the left saphenofemoral junction, common femoral, femoral, popliteal, posterior tibial and peroneal veins of the LLE. He was referred to [place] for admission and management. Denies CP, SOB, cough. Has been quite sedentary and has balance issues and does outpatient rehab.   Allergies:   Allergies  Allergen Reactions  . Iohexol Anaphylaxis     Desc: RN states pt stated he had anyphalactic shock reaction to contrast approx 10 years ago.       Past Medical History  Diagnosis Date  . Hypertension   . Diabetes mellitus   . Pacemaker     Past Surgical History  Procedure Date  . Prostatectomy   . Coronary artery bypass graft     Prior to Admission medications   Medication Sig Start Date End Date Taking? Authorizing Provider  amLODipine (NORVASC) 5 MG tablet Take 5 mg by mouth daily.    Historical Provider, MD  atenolol (TENORMIN) 50 MG tablet Take 50 mg by mouth 2 (two) times daily.     Historical Provider, MD  b complex vitamins tablet Take 1 tablet by mouth daily.    Historical Provider, MD  Cholecalciferol 2000 UNITS TABS Take 2,000 Units by mouth daily.    Historical Provider, MD  co-enzyme Q-10 50 MG capsule Take 50 mg by mouth daily.    Historical Provider, MD  escitalopram (LEXAPRO) 20 MG tablet Take 20 mg by mouth daily.    Historical Provider, MD  folic acid (FOLVITE) 1 MG tablet Take 1 mg by mouth daily.    Historical Provider, MD  furosemide (LASIX) 20 MG tablet Take 20 mg by mouth every other day.    Historical Provider, MD  glimepiride (AMARYL) 4 MG tablet Take 4 mg by mouth every evening.     Historical Provider, MD  linagliptin (TRADJENTA)  5 MG TABS tablet Take 5 mg by mouth every morning.    Historical Provider, MD  memantine (NAMENDA) 10 MG tablet Take 20 mg by mouth daily.    Historical Provider, MD  olmesartan (BENICAR) 40 MG tablet Take 20 mg by mouth daily.    Historical Provider, MD  omega-3 acid ethyl esters (LOVAZA) 1 G capsule Take 1 g by mouth every morning.    Historical Provider, MD  selenium 50 MCG TABS Take 50 mcg by mouth daily.    Historical Provider, MD  Turmeric Curcumin 500 MG CAPS Take 500 mg by mouth daily.    Historical Provider, MD    Social History:  reports that he has never smoked. He does not have any smokeless tobacco history on file. He reports that he does not drink alcohol or use illicit drugs.  No family history on file.  Review of Systems:  Constitutional: Denies fever, chills, diaphoresis, appetite change. HEENT: Denies photophobia, eye pain, redness, hearing loss, ear pain, congestion, sore throat, rhinorrhea, sneezing, mouth sores, trouble swallowing, neck pain, neck stiffness and tinnitus.   Respiratory: Denies SOB, DOE, cough, chest tightness,  and wheezing.   Cardiovascular: Denies chest pain, palpitations and leg swelling.  Gastrointestinal: Denies nausea, vomiting, abdominal pain, diarrhea, constipation, blood in  stool and abdominal distention.  Genitourinary: Denies dysuria, urgency, frequency, hematuria, flank pain and difficulty urinating.  Musculoskeletal: Denies myalgias, back pain, joint swelling, arthralgias and gait problem.  Skin: Denies pallor, rash and wound.  Neurological: Denies dizziness, seizures, syncope, weakness, light-headedness, numbness and headaches.  Hematological: Denies adenopathy. Easy bruising, personal or family bleeding history  Psychiatric/Behavioral: Denies suicidal ideation, mood changes, confusion, nervousness, sleep disturbance and agitation   Physical Exam: Blood pressure 158/86, pulse 78, temperature 97.7 F (36.5 C), temperature source Oral,  resp. rate 16, height 5\' 10"  (1.778 m), weight 69.5 kg (153 lb 3.5 oz), SpO2 100.00%. Gen; AAOx3, NAD HEENT: [place]/AT/PERRL Neck: supple, no JVD, no LAD, no bruits, no goiter. CV: RRR, no M/R/G Lungs: CTA B Abd: S/NT/ND/+BS/no masses or organomegaly noted. Ext: 2+pitting edema of LLE. Neuro:non focal. Skin: no rashes identified.  Labs on Admission:  Results for orders placed during the hospital encounter of 04/18/12 (from the past 48 hour(s))  GLUCOSE, CAPILLARY     Status: Normal   Collection Time   04/18/12  6:08 PM      Component Value Range Comment   Glucose-Capillary 93  70 - 99 mg/dL     Radiological Exams on Admission: No results found.  Assessment/Plan Principal Problem:  *Left leg DVT Active Problems:  HTN (hypertension)  Hyperlipidemia  CAD (coronary artery disease)  DM (diabetes mellitus)  Prostate cancer  CKD (chronic kidney disease)   Extensive LLE DVT -Will start on therapeutic doses of lovenox tonight. -Discussed risk/benefit of coumadin with patient, wife and patient's daughter Teodora Medici who is a GI MD in [place] [phone]). -Given his age, frail general state and balance issues, we feel that the risk of coumadin is higher that the benefit. -We have agreed on placement of an IVC filter. -IR consultation has been placed.  HTN -Continue home meds.  DM -Check CBGs. -Continue on homo PO hypoglycemic agents.  CKD -Unsure of stage as do not have any baseline Cr on file. -Check BMET.  Time Spent on Admission: 55 minutes   HERNANDEZ ACOSTA,ESTELA [facility] Pager: [phone] 04/18/2012, 7:02 PM

## 2012-04-18 NOTE — Progress Notes (Signed)
[name] [id number] Admitted to 5527: 04/18/2012 6:21 PM Attending Provider: Micael Hampshire Acost*    [name] is a 78 y.o. male patient admitted from vascular lab, awake, alert  & orientated  X 3,  No Order, VSS - Blood pressure 158/86, pulse 78, temperature 97.7 F (36.5 C), temperature source Oral, resp. rate 16, height 5\' 10"  (1.778 m), weight 69.5 kg (153 lb 3.5 oz), SpO2 100.00%., O2    R/A , no c/o shortness of breath, no c/o chest pain, no distress noted. :     Allergies:   Allergies  Allergen Reactions  . Iohexol Anaphylaxis     Desc: RN states pt stated he had anyphalactic shock reaction to contrast approx 10 years ago.      Past Medical History  Diagnosis Date  . Hypertension   . Diabetes mellitus   . Pacemaker      Pt orientation to unit, room and routine. Information packet given to patient/family and safety video watched.  Admission INP armband ID verified with patient/family, and in place. SR up x 2, fall risk assessment complete with Patient and family verbalizing understanding of risks associated with falls. Pt verbalizes an understanding of how to use the call bell and to call for help before getting out of bed.  Skin, clean-dry- intact with bruising to bilat.arms & legs.   No evidence of skin break down noted on exam.    Dr. Ardyth Harps & admission MD text paged.  Return call from Dr. Ardyth Harps, who stated she will be up to see him.  Will cont to monitor and assist as needed.  Joana Reamer, RN 04/18/2012 6:21 PM

## 2012-04-18 NOTE — Progress Notes (Signed)
ANTICOAGULATION CONSULT NOTE - Initial Consult  Pharmacy Consult for Lovenox Indication: DVT  Allergies  Allergen Reactions  . Iohexol Anaphylaxis     Desc: RN states pt stated he had anyphalactic shock reaction to contrast approx 10 years ago.     Patient Measurements: Height: 5\' 10"  (177.8 cm) Weight: 153 lb 3.5 oz (69.5 kg) IBW/kg (Calculated) : 73  Heparin Dosing Weight:   Vital Signs: Temp: 98.3 F (36.8 C) (09/17 2142) Temp src: Oral (09/17 2142) BP: 164/77 mmHg (09/17 2132) Pulse Rate: 83  (09/17 2142)  Labs:  Basename 04/18/12 1933  HGB --  HCT --  PLT --  APTT --  LABPROT --  INR --  HEPARINUNFRC --  CREATININE 2.06*  CKTOTAL --  CKMB --  TROPONINI --    Estimated Creatinine Clearance: 25.3 ml/min (by C-G formula based on Cr of 2.06).   Medical History: Past Medical History  Diagnosis Date  . Hypertension   . Diabetes mellitus   . Pacemaker     Medications:  Scheduled:    . amLODipine  5 mg Oral Daily  . atenolol  50 mg Oral BID  . cholecalciferol  2,000 Units Oral Daily  . enoxaparin (LOVENOX) injection  70 mg Subcutaneous Once  . enoxaparin (LOVENOX) injection  70 mg Subcutaneous Q24H  . escitalopram  20 mg Oral Daily  . folic acid  1 mg Oral Daily  . furosemide  20 mg Oral QODAY  . glimepiride  4 mg Oral QPM  . irbesartan  75 mg Oral Daily  . linagliptin  5 mg Oral q morning - 10a  . memantine  20 mg Oral Daily  . omega-3 acid ethyl esters  1 g Oral q morning - 10a  . sodium chloride  3 mL Intravenous Q12H  . DISCONTD: Cholecalciferol  2,000 Units Oral Daily  . DISCONTD: enoxaparin (LOVENOX) injection  40 mg Subcutaneous Q24H    Assessment: 78 yr old male [facility] by his PCP with a DVT of his left leg. He is not thought to be a good candidate for Coumadin due to falls so an IVC filter is planned for tomorrow. CrCl=25 ml/min. Goal of Therapy:  Heparin level 0.3-0.7 units/ml Monitor platelets by anticoagulation protocol: Yes   Plan:  Lovenox 70 mg sq q24hr. CBC q3days while on lovenox.  Eugene Garnet 04/18/2012,10:58 PM

## 2012-04-18 NOTE — Progress Notes (Signed)
Shift event:  This NP was paged directly to [phone] and when call returned, it was this pt's daughter, a MD in [place]. Daughter was very upset that she didn't get a call back today from rounding MD about plans for IVC filter. Daughter was very rude and ranting on the phone about the call she didn't get and "no one apparently knows what is going on with my father" there. I explained that I didn't make rounds on her father, but would be happy to find out what I could about his treatment plan. An order was placed by [facility] doc for IR consult for placement of filter. I called radiology to confirm and the consult is on the books for tomorrow along with placement of the filter if that is deemed appropriate by IR. I called the daughter back to explain plan for tomorrow and she thanked me.  Maren Reamer, NP [facility]

## 2012-04-18 NOTE — Progress Notes (Signed)
*  Preliminary Results* Left lower extremity venous duplex completed. Left lower extremity is positive for deep vein thrombosis involving the left saphenofemoral junction, common femoral, femoral, popliteal, posterior tibial and peroneal veins.  Preliminary results discussed with Dr.Griffin.  04/18/2012 4:48 PM Gertie Fey, RDMS, RDCS

## 2012-04-19 ENCOUNTER — Encounter: Payer: Medicare Other | Admitting: Physical Therapy

## 2012-04-19 ENCOUNTER — Other Ambulatory Visit: Payer: Medicare Other

## 2012-04-19 ENCOUNTER — Inpatient Hospital Stay (HOSPITAL_COMMUNITY): Payer: Medicare Other

## 2012-04-19 LAB — BASIC METABOLIC PANEL
BUN: 35 mg/dL — ABNORMAL HIGH (ref 6–23)
CO2: 23 mEq/L (ref 19–32)
Calcium: 9.8 mg/dL (ref 8.4–10.5)
Chloride: 106 mEq/L (ref 96–112)
Creatinine, Ser: 2.14 mg/dL — ABNORMAL HIGH (ref 0.50–1.35)
Glucose, Bld: 168 mg/dL — ABNORMAL HIGH (ref 70–99)

## 2012-04-19 LAB — CBC
HCT: 33.4 % — ABNORMAL LOW (ref 39.0–52.0)
MCH: 30.5 pg (ref 26.0–34.0)
MCHC: 35.9 g/dL (ref 30.0–36.0)
MCV: 84.8 fL (ref 78.0–100.0)
Platelets: 108 10*3/uL — ABNORMAL LOW (ref 150–400)
RDW: 13.5 % (ref 11.5–15.5)

## 2012-04-19 LAB — GLUCOSE, CAPILLARY
Glucose-Capillary: 157 mg/dL — ABNORMAL HIGH (ref 70–99)
Glucose-Capillary: 167 mg/dL — ABNORMAL HIGH (ref 70–99)

## 2012-04-19 LAB — PROTIME-INR: INR: 1.2 (ref 0.00–1.49)

## 2012-04-19 MED ORDER — FENTANYL CITRATE 0.05 MG/ML IJ SOLN
INTRAMUSCULAR | Status: AC | PRN
  Administered 2012-04-19 (×2): 50 ug via INTRAVENOUS

## 2012-04-19 MED ORDER — MIDAZOLAM HCL 5 MG/5ML IJ SOLN
INTRAMUSCULAR | Status: AC | PRN
  Administered 2012-04-19 (×2): 1 mg via INTRAVENOUS

## 2012-04-19 NOTE — Procedures (Signed)
Tapeaze IVC filter tip L2 sup CO2 venography No comp

## 2012-04-19 NOTE — H&P (Signed)
Cc: acute DVT. Not a candidate for anticoagulation secondary to fall risk. Needs permanent IVC filter placement.   HPI: see medical MD note below :  Paul Cloud, MD Physician Signed Internal Medicine H&P 04/18/2012 7:01 PM                                                                                                                                                    [facility]                                                                                                                                                 History and Physical    PCP:   Katy Apo, MD    Chief Complaint:   LLE swelling  HPI: 78 y/o man with h/o CAD, HTN, HLD, DM, prostate cancer went to see his PCP Dr. Valentina Lucks 2/2 leg edema that began yesterday. He was sent for a doppler that was positive for deep vein thrombosis involving the left saphenofemoral junction, common femoral, femoral, popliteal, posterior tibial and peroneal veins of the LLE. He was referred to [place] for admission and management. Denies CP, SOB, cough. Has been quite sedentary and has balance issues and does outpatient rehab.   Allergies:    Allergies   Allergen  Reactions   .  Iohexol  Anaphylaxis       Desc: RN states pt stated he had anyphalactic shock reaction to contrast approx 10 years ago.         Past Medical History   Diagnosis  Date   .  Hypertension     .  Diabetes mellitus     .  Pacemaker         Past Surgical History   Procedure  Date   .  Prostatectomy     .  Coronary artery bypass graft         Prior to Admission medications    Medication  Sig  Start Date  End Date  Taking?  Authorizing Provider   amLODipine (NORVASC) 5 MG tablet  Take 5 mg by mouth daily.        Historical Provider, MD   atenolol (TENORMIN)  50 MG tablet  Take 50 mg by mouth 2 (two) times daily.         Historical Provider, MD   b complex vitamins tablet  Take 1 tablet by mouth daily.        Historical Provider, MD     Cholecalciferol 2000 UNITS TABS  Take 2,000 Units by mouth daily.        Historical Provider, MD   co-enzyme Q-10 50 MG capsule  Take 50 mg by mouth daily.        Historical Provider, MD   escitalopram (LEXAPRO) 20 MG tablet  Take 20 mg by mouth daily.        Historical Provider, MD   folic acid (FOLVITE) 1 MG tablet  Take 1 mg by mouth daily.        Historical Provider, MD   furosemide (LASIX) 20 MG tablet  Take 20 mg by mouth every other day.        Historical Provider, MD   glimepiride (AMARYL) 4 MG tablet  Take 4 mg by mouth every evening.         Historical Provider, MD   linagliptin (TRADJENTA) 5 MG TABS tablet  Take 5 mg by mouth every morning.        Historical Provider, MD   memantine (NAMENDA) 10 MG tablet  Take 20 mg by mouth daily.        Historical Provider, MD   olmesartan (BENICAR) 40 MG tablet  Take 20 mg by mouth daily.        Historical Provider, MD   omega-3 acid ethyl esters (LOVAZA) 1 G capsule  Take 1 g by mouth every morning.        Historical Provider, MD   selenium 50 MCG TABS  Take 50 mcg by mouth daily.        Historical Provider, MD   Turmeric Curcumin 500 MG CAPS  Take 500 mg by mouth daily.        Historical Provider, MD     Social History: reports that he has never smoked. He does not have any smokeless tobacco history on file. He reports that he does not drink alcohol or use illicit drugs.  No family history on file.  Review of Systems:  Constitutional: Denies fever, chills, diaphoresis, appetite change. HEENT: Denies photophobia, eye pain, redness, hearing loss, ear pain, congestion, sore throat, rhinorrhea, sneezing, mouth sores, trouble swallowing, neck pain, neck stiffness and tinnitus.   Respiratory: Denies SOB, DOE, cough, chest tightness,  and wheezing.   Cardiovascular: Denies chest pain, palpitations and leg swelling.  Gastrointestinal: Denies nausea, vomiting, abdominal pain, diarrhea, constipation, blood in stool and abdominal distention.   Genitourinary: Denies dysuria, urgency, frequency, hematuria, flank pain and difficulty urinating.  Musculoskeletal: Denies myalgias, back pain, joint swelling, arthralgias and gait problem.  Skin: Denies pallor, rash and wound.  Neurological: Denies dizziness, seizures, syncope, weakness, light-headedness, numbness and headaches.  Hematological: Denies adenopathy. Easy bruising, personal or family bleeding history  Psychiatric/Behavioral: Denies suicidal ideation, mood changes, confusion, nervousness, sleep disturbance and agitation   Physical Exam: Blood pressure 158/86, pulse 78, temperature 97.7 F (36.5 C), temperature source Oral, resp. rate 16, height 5\' 10"  (1.778 m), weight 69.5 kg (153 lb 3.5 oz), SpO2 100.00%. Gen; AAOx3, NAD HEENT: [place]/AT/PERRL Neck: supple, no JVD, no LAD, no bruits, no goiter. CV: RRR, no M/R/G Lungs: CTA B Abd: S/NT/ND/+BS/no masses or organomegaly noted. Ext: 2+pitting edema of LLE. Neuro:non focal. Skin: no rashes identified.  Labs on Admission:  Results for orders placed during the hospital encounter of 04/18/12 (from the past 48 hour(s))   GLUCOSE, CAPILLARY     Status: Normal     Collection Time     04/18/12  6:08 PM       Component  Value  Range  Comment     Glucose-Capillary  93   70 - 99 mg/dL       Radiological Exams on Admission: No results found.  Assessment/Plan Principal Problem:  *Left leg DVT Active Problems:  HTN (hypertension)  Hyperlipidemia  CAD (coronary artery disease)  DM (diabetes mellitus)  Prostate cancer  CKD (chronic kidney disease)   Extensive LLE DVT -Will start on therapeutic doses of lovenox tonight. -Discussed risk/benefit of coumadin with patient, wife and patient's daughter Teodora Medici who is a GI MD in [place] [phone]). -Given his age, frail general state and balance issues, we feel that the risk of coumadin is higher that the benefit. -We have agreed on placement of an IVC filter. -IR consultation  has been placed.  HTN -Continue home meds.  DM -Check CBGs. -Continue on homo PO hypoglycemic agents.  CKD -Unsure of stage as do not have any baseline Cr on file. -Check BMET.  Time Spent on Admission: 55 minutes   HERNANDEZ ACOSTA,ESTELA [facility] Pager: [phone] 04/18/2012, 7:02 PM     Today's examination findings :  PE; patient awake, alert, but quiet.  Spouse present. CV: RRR without M/R/G resp : CTA bilaterally MS:  LLE edema with tenderness to palpation  Additional lab studies :  Results for MELBERT, BOTELHO (MRN [id number]) as of 04/19/2012 08:48  Ref. Range 04/19/2012 05:55  Sodium Latest Range: 135-145 mEq/L 140  Potassium Latest Range: 3.5-5.1 mEq/L 3.8  Chloride Latest Range: 96-112 mEq/L 106  CO2 Latest Range: 19-32 mEq/L 23  BUN Latest Range: 6-23 mg/dL 35 (H)  Creatinine Latest Range: 0.50-1.35 mg/dL 3.08 (H)  Calcium Latest Range: 8.4-10.5 mg/dL 9.8  GFR calc non Af Amer Latest Range: >90 mL/min 26 (L)  GFR calc Af Amer Latest Range: >90 mL/min 30 (L)  Glucose Latest Range: 70-99 mg/dL 657 (H)  WBC Latest Range: 4.0-10.5 K/uL 5.1  RBC Latest Range: 4.22-5.81 MIL/uL 3.94 (L)  Hemoglobin Latest Range: 13.0-17.0 g/dL 84.6 (L)  HCT Latest Range: 39.0-52.0 % 33.4 (L)  MCV Latest Range: 78.0-100.0 fL 84.8  MCH Latest Range: 26.0-34.0 pg 30.5  MCHC Latest Range: 30.0-36.0 g/dL 96.2  RDW Latest Range: 11.5-15.5 % 13.5  Platelets Latest Range: 150-400 K/uL 108 (L)    Plan :   Discussed with patient and spouse in detail who has POA for patient indications, benefits and risks of IVC filter placement and all their questions answered to their satisfaction. Given history of contrast allergy - will use CO2 for filter placement. Spouse provided written consent.

## 2012-04-19 NOTE — Progress Notes (Signed)
Subjective: Patient pleasant, no apparent distress. Admission H&P reviewed, ultrasound suggested extensive clot burden. Patient has displayed progressive failure to thrive, very deconditioned and has been stumbling lately, he's felt to be a high risk for falls, patient's wife and daughter want to avoid Coumadin and proceed with Greenfield filter. Appropriate consults have been placed. I have discussed case with family in detail and so has several other physicians  Objective: Vital signs in last 24 hours: Temp:  [97.7 F (36.5 C)-98.6 F (37 C)] 98.6 F (37 C) (09/18 0508) Pulse Rate:  [64-83] 64  (09/18 0930) Resp:  [16-18] 18  (09/18 0508) BP: (114-164)/(67-86) 158/72 mmHg (09/18 0930) SpO2:  [97 %-100 %] 97 % (09/18 0508) Weight:  [69.5 kg (153 lb 3.5 oz)] 69.5 kg (153 lb 3.5 oz) (09/17 1745) Weight change:     Intake/Output from previous day: 09/17 0701 - 09/18 0700 In: 120 [P.O.:120] Out: -  Intake/Output this shift:    General appearance: alert and cooperative Resp: clear to auscultation bilaterally Cardio: regular rate and rhythm, S1, S2 normal, no murmur, click, rub or gallop Extremities: 2-3+ edema left lower extremity, no calf pain  Lab Results:  Results for orders placed during the hospital encounter of 04/18/12 (from the past 24 hour(s))  GLUCOSE, CAPILLARY     Status: Normal   Collection Time   04/18/12  6:08 PM      Component Value Range   Glucose-Capillary 93  70 - 99 mg/dL  BASIC METABOLIC PANEL     Status: Abnormal   Collection Time   04/18/12  7:33 PM      Component Value Range   Sodium 142  135 - 145 mEq/L   Potassium 4.4  3.5 - 5.1 mEq/L   Chloride 105  96 - 112 mEq/L   CO2 26  19 - 32 mEq/L   Glucose, Bld 107 (*) 70 - 99 mg/dL   BUN 33 (*) 6 - 23 mg/dL   Creatinine, Ser 1.61 (*) 0.50 - 1.35 mg/dL   Calcium 09.6 (*) 8.4 - 10.5 mg/dL   GFR calc non Af Amer 28 (*) >90 mL/min   GFR calc Af Amer 32 (*) >90 mL/min  GLUCOSE, CAPILLARY     Status: Abnormal    Collection Time   04/18/12  9:44 PM      Component Value Range   Glucose-Capillary 160 (*) 70 - 99 mg/dL   Comment 1 Documented in Chart     Comment 2 Notify RN    BASIC METABOLIC PANEL     Status: Abnormal   Collection Time   04/19/12  5:55 AM      Component Value Range   Sodium 140  135 - 145 mEq/L   Potassium 3.8  3.5 - 5.1 mEq/L   Chloride 106  96 - 112 mEq/L   CO2 23  19 - 32 mEq/L   Glucose, Bld 168 (*) 70 - 99 mg/dL   BUN 35 (*) 6 - 23 mg/dL   Creatinine, Ser 0.45 (*) 0.50 - 1.35 mg/dL   Calcium 9.8  8.4 - 40.9 mg/dL   GFR calc non Af Amer 26 (*) >90 mL/min   GFR calc Af Amer 30 (*) >90 mL/min  CBC     Status: Abnormal   Collection Time   04/19/12  5:55 AM      Component Value Range   WBC 5.1  4.0 - 10.5 K/uL   RBC 3.94 (*) 4.22 - 5.81 MIL/uL   Hemoglobin  12.0 (*) 13.0 - 17.0 g/dL   HCT 40.9 (*) 81.1 - 91.4 %   MCV 84.8  78.0 - 100.0 fL   MCH 30.5  26.0 - 34.0 pg   MCHC 35.9  30.0 - 36.0 g/dL   RDW 78.2  95.6 - 21.3 %   Platelets 108 (*) 150 - 400 K/uL  GLUCOSE, CAPILLARY     Status: Abnormal   Collection Time   04/19/12  8:00 AM      Component Value Range   Glucose-Capillary 157 (*) 70 - 99 mg/dL  PROTIME-INR     Status: Normal   Collection Time   04/19/12  9:21 AM      Component Value Range   Prothrombin Time 15.0  11.6 - 15.2 seconds   INR 1.20  0.00 - 1.49  GLUCOSE, CAPILLARY     Status: Abnormal   Collection Time   04/19/12 12:32 PM      Component Value Range   Glucose-Capillary 167 (*) 70 - 99 mg/dL      Studies/Results: No results found.  Medications:  Prior to Admission:  Prescriptions prior to admission  Medication Sig Dispense Refill  . amLODipine (NORVASC) 5 MG tablet Take 5 mg by mouth daily.      Marland Kitchen atenolol (TENORMIN) 50 MG tablet Take 50 mg by mouth 2 (two) times daily.       Marland Kitchen b complex vitamins tablet Take 1 tablet by mouth daily.      . Cholecalciferol 2000 UNITS TABS Take 2,000 Units by mouth daily.      Marland Kitchen co-enzyme Q-10 50 MG  capsule Take 50 mg by mouth daily.      Marland Kitchen escitalopram (LEXAPRO) 20 MG tablet Take 20 mg by mouth daily.      . folic acid (FOLVITE) 1 MG tablet Take 1 mg by mouth daily.      . furosemide (LASIX) 20 MG tablet Take 20 mg by mouth daily.      Marland Kitchen glimepiride (AMARYL) 4 MG tablet Take 4 mg by mouth every evening.       . linagliptin (TRADJENTA) 5 MG TABS tablet Take 5 mg by mouth every morning.      . memantine (NAMENDA) 10 MG tablet Take 20 mg by mouth daily.      Marland Kitchen olmesartan (BENICAR) 40 MG tablet Take 20 mg by mouth daily.      Marland Kitchen omega-3 acid ethyl esters (LOVAZA) 1 G capsule Take 1 g by mouth every morning.      . prednisoLONE acetate (PRED FORTE) 1 % ophthalmic suspension Place 1 drop into both eyes 2 (two) times daily.       Marland Kitchen selenium 50 MCG TABS Take 50 mcg by mouth daily.      . sertraline (ZOLOFT) 25 MG tablet Take 50 mg by mouth At bedtime.      . Turmeric Curcumin 500 MG CAPS Take 500 mg by mouth daily.       Scheduled:   . amLODipine  5 mg Oral Daily  . atenolol  50 mg Oral BID  . cholecalciferol  2,000 Units Oral Daily  . enoxaparin (LOVENOX) injection  70 mg Subcutaneous Once  . enoxaparin (LOVENOX) injection  70 mg Subcutaneous Q24H  . escitalopram  20 mg Oral Daily  . folic acid  1 mg Oral Daily  . furosemide  20 mg Oral QODAY  . glimepiride  4 mg Oral QPM  . irbesartan  75 mg Oral Daily  .  linagliptin  5 mg Oral q morning - 10a  . memantine  20 mg Oral Daily  . omega-3 acid ethyl esters  1 g Oral q morning - 10a  . sodium chloride  3 mL Intravenous Q12H  . DISCONTD: Cholecalciferol  2,000 Units Oral Daily  . DISCONTD: enoxaparin (LOVENOX) injection  40 mg Subcutaneous Q24H   Continuous:  NWG:NFAOZH chloride, acetaminophen, acetaminophen, ondansetron (ZOFRAN) IV, ondansetron, oxyCODONE, senna-docusate, sodium chloride  Assessment/Plan: Principal Problem:  *Left leg DVT Active Problems:  HTN (hypertension)  Hyperlipidemia  CAD (coronary artery disease)  DM  (diabetes mellitus)  Prostate cancer  CKD (chronic kidney disease) as discussed above plan for IVC filter.  LOS: 1 day   Jamaurie Bernier D 04/19/2012, 12:50 PM

## 2012-04-20 LAB — GLUCOSE, CAPILLARY
Glucose-Capillary: 167 mg/dL — ABNORMAL HIGH (ref 70–99)
Glucose-Capillary: 165 mg/dL — ABNORMAL HIGH (ref 70–99)

## 2012-04-20 LAB — BASIC METABOLIC PANEL
BUN: 37 mg/dL — ABNORMAL HIGH (ref 6–23)
Calcium: 9.8 mg/dL (ref 8.4–10.5)
GFR calc Af Amer: 29 mL/min — ABNORMAL LOW (ref >90)
GFR calc non Af Amer: 25 mL/min — ABNORMAL LOW (ref >90)
Potassium: 4.3 mEq/L (ref 3.5–5.1)

## 2012-04-20 LAB — CBC
HCT: 35.6 % — ABNORMAL LOW (ref 39.0–52.0)
MCH: 29.4 pg (ref 26.0–34.0)
MCHC: 34.6 g/dL (ref 30.0–36.0)
RDW: 13.6 % (ref 11.5–15.5)

## 2012-04-20 NOTE — Discharge Summary (Signed)
Physician Discharge Summary  Patient ID: [name] MRN: [id number] DOB/AGE: 04/05/1923 78 y.o.  Admit date: 04/18/2012 Discharge date: 04/20/2012  Admission Diagnoses:  Discharge Diagnoses:  Principal Problem:  *Left leg DVT Active Problems:  HTN (hypertension)  Hyperlipidemia  CAD (coronary artery disease)  DM (diabetes mellitus)  Prostate cancer  CKD (chronic kidney disease) memory loss  Discharged Condition: stable  Hospital Course:  Patient was directly admitted to the hospital after presenting to the office with left leg swelling. Outpatient ultrasound revealed extensive left leg DVT. Prior to admission patient was displaying qualities of failure to thrive. Progressive fatigue inability to get out of bed or lack of desire to get out of bed. He was sent to physical therapy on outpatient basis. His p.o. Intake has declined as well. He also has been felt to be a fall risk. Because of the above his family has decided against anti-coagulation and we have proceeded with placement of a Greenfield filter. Patient underwent procedure without complication, he's currently at his baseline level of function and medically stable for discharge to home. Patient's family has requested support stockings.  Consults:    Significant Diagnostic Studies:Ir Ivc Filter Plmt / S&i /img Guid/mod Sed  04/20/2012  *RADIOLOGY REPORT*  Clinical Data/Indication: DVT.  FALL RISK. SEVERE CONTRAST ALLERGY. RENAL INSUFFICIENCY.  IR IVC FILTER PLACEMENT/ S+I/ IMAGE GUIDE MODERATE SEDATION, CARBON DIOXIDE IVC VENOGRAPHY.  Sedation: Versed 2.0 mg, Fentanyl 100 mg.  Total Moderate Sedation Time: 15 minutes.  Contrast Volume: Carbon dioxide only.  Fluoroscopy Time: 1.9 minutes.  Procedure: The procedure, risks, benefits, and alternatives were explained to the patient. Questions regarding the procedure were encouraged and answered. The patient understands and consents to the procedure.  The right neck was prepped  with betadine in a sterile fashion, and a sterile drape was applied covering the operative field. A sterile gown and sterile gloves were used for the procedure.  Under sonographic guidance, a micropuncture needle was inserted into the right internal jugular vein and removed over a 0018 wire which was upsized to a [employer].  Sonographic and fluoroscopic documentation was obtained.  The 6-French sheath was advanced over the wire to the lower IVC. Carbon dioxide venography was performed.  A 5-French vertebral catheter was advanced through the sheath.  The right renal vein was selected over a glide wire.  Right renal vein venography was performed utilizing carbon dioxide.  The permanent TrapEase IVC filter was deployed in the infrarenal IVC with its tip at L2 superior endplate.  The sheath was removed and hemostasis was achieved with direct pressure.  Findings: Carbon dioxide venography demonstrates no DVT or venous anomaly.  Renal vein inflow is noted at the L1-2 disc.  Left renal vein was noted on IVC venography.  The right renal vein was not clearly delineated.  Subsequent right renal vein venography utilizing carbon dioxide demonstrates inflow at the L1-2 disc.  The final image demonstrates a permanent TrapEase IVC filter with its tip at the L2 superior endplate.  Complications: None.  IMPRESSION: Successful IVC filter placement.  Carbon dioxide venography was performed.   Original Report Authenticated By: Donavan Burnet, M.D.       Discharge Exam: Blood pressure 131/67, pulse 63, temperature 98.9 F (37.2 C), temperature source Oral, resp. rate 16, height 5\' 10"  (1.778 m), weight 69.5 kg (153 lb 3.5 oz), SpO2 96.00%. General appearance: alert and cooperative Resp: clear to auscultation bilaterally Cardio: regular rate and rhythm, S1, S2 normal, no murmur, click, rub or  gallop Extremities: 2+ edema left lower extremity, no calf pain, negative Homan  Disposition: 01-Home or Self Care  Discharge Orders      Future Appointments: Provider: Department: Dept Phone: Center:   04/24/2012 3:15 PM Amy Aleatha Borer, PT [facility] [phone] [facility]   04/26/2012 1:45 PM Amy Aleatha Borer, PT [facility] [phone] [facility]   05/03/2012 1:45 PM Amy Aleatha Borer, PT [facility] [phone] [facility]   05/05/2012 11:15 AM Amy Aleatha Borer, PT [facility] [phone] [facility]     Future Orders Please Complete By Expires   Compression stockings          Medication List     As of 04/20/2012  1:03 PM    TAKE these medications         amLODipine 5 MG tablet   Commonly known as: NORVASC   Take 5 mg by mouth daily.      atenolol 50 MG tablet   Commonly known as: TENORMIN   Take 50 mg by mouth 2 (two) times daily.      b complex vitamins tablet   Take 1 tablet by mouth daily.      Cholecalciferol 2000 UNITS Tabs   Take 2,000 Units by mouth daily.      co-enzyme Q-10 50 MG capsule   Take 50 mg by mouth daily.      escitalopram 20 MG tablet   Commonly known as: LEXAPRO   Take 20 mg by mouth daily.      folic acid 1 MG tablet   Commonly known as: FOLVITE   Take 1 mg by mouth daily.      furosemide 20 MG tablet   Commonly known as: LASIX   Take 20 mg by mouth daily.      glimepiride 4 MG tablet   Commonly known as: AMARYL   Take 4 mg by mouth every evening.      linagliptin 5 MG Tabs tablet   Commonly known as: TRADJENTA   Take 5 mg by mouth every morning.      memantine 10 MG tablet   Commonly known as: NAMENDA   Take 20 mg by mouth daily.      olmesartan 40 MG tablet   Commonly known as: BENICAR   Take 20 mg by mouth daily.      omega-3 acid ethyl esters 1 G capsule   Commonly known as: LOVAZA   Take 1 g by mouth every morning.      prednisoLONE acetate 1 % ophthalmic suspension   Commonly known as: PRED FORTE   Place 1 drop into both eyes 2 (two) times daily.      selenium 50 MCG Tabs   Take 50 mcg by mouth daily.      sertraline 25 MG tablet   Commonly known  as: ZOLOFT   Take 50 mg by mouth At bedtime.      Turmeric Curcumin 500 MG Caps   Take 500 mg by mouth daily.           Follow-up Information    Follow up with Deasiah Hagberg D, MD. In 1 week.   Contact information:   [address] [place] [address] [phone]          Signed: Katy Apo 04/20/2012, 1:03 PM

## 2012-04-20 NOTE — Evaluation (Signed)
Physical Therapy Evaluation Patient Details Name: [name] MRN: [id number] DOB: Jul 01, 1923 Today's Date: 04/20/2012 Time: 4098-1191 PT Time Calculation (min): 16 min  PT Assessment / Plan / Recommendation Clinical Impression  Pt admitted with acute LLE DVT s/p IVC filter placement. Pt currently receiving OPPT for balance training and recommend continuation of this venue. Pt mobilizing well and at baseline without need for further acute therapy. Wife requests BSC due to difficulty of pt getting up from low commode at home. All education complete.     PT Assessment  All further PT needs can be met in the next venue of care    Follow Up Recommendations  Outpatient PT    Barriers to Discharge        Equipment Recommendations  3 in 1 bedside comode    Recommendations for Other Services     Frequency      Precautions / Restrictions Precautions Precautions: None   Pertinent Vitals/Pain No pain      Mobility  Bed Mobility Bed Mobility: Supine to Sit Supine to Sit: 6: Modified independent (Device/Increase time);HOB flat Transfers Transfers: Sit to Stand;Stand to Sit Sit to Stand: 6: Modified independent (Device/Increase time);From bed Stand to Sit: 6: Modified independent (Device/Increase time);To bed Ambulation/Gait Ambulation/Gait Assistance: 7: Independent Ambulation Distance (Feet): 500 Feet Assistive device: None Gait Pattern: Within Functional Limits Stairs: Yes Stairs Assistance: 6: Modified independent (Device/Increase time) Stair Management Technique: One rail Right Number of Stairs: 11     Exercises     PT Diagnosis:    PT Problem List: Decreased balance PT Treatment Interventions:     PT Goals    Visit Information  Last PT Received On: 04/20/12 Assistance Needed: +1    Subjective Data  Subjective: I don't do much anymore other than read Patient Stated Goal: go home   Prior Functioning  Home Living Lives With: Spouse Available Help at  Discharge: Family;Available 24 hours/day Type of Home: House Home Access: Stairs to enter [employer] of Steps: 2 Home Layout: Two level;Able to live on main level with bedroom/bathroom Bathroom Shower/Tub: Health visitor: Standard Home Adaptive Equipment: None Prior Function Level of Independence: Needs assistance Needs Assistance: Light Housekeeping;Meal Prep Meal Prep: Moderate Light Housekeeping: Moderate Able to Take Stairs?: Yes Driving: Yes Vocation: Retired 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, 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    Cognition  Overall Cognitive Status: Appears within functional limits for tasks assessed/performed Arousal/Alertness: Awake/alert Orientation Level: Appears intact for tasks assessed Behavior During Session: [facility] for tasks performed    Extremity/Trunk Assessment Right Upper Extremity Assessment RUE ROM/Strength/Tone: [facility] for tasks assessed Left Upper Extremity Assessment LUE ROM/Strength/Tone: [facility] for tasks assessed Right Lower Extremity Assessment RLE ROM/Strength/Tone: [facility] for tasks assessed Left Lower Extremity Assessment LLE ROM/Strength/Tone: WFL for tasks assessed Trunk Assessment Trunk Assessment: Normal   Balance    End of Session PT - End of Session Activity Tolerance: Patient tolerated treatment well Patient left: in bed;with family/visitor present Nurse Communication: Mobility status  GP     Delorse Lek 04/20/2012, 1:34 PM  Delaney Meigs, PT [phone]

## 2012-04-20 NOTE — Progress Notes (Signed)
[name] discharged Home per MD order.  Discharge instructions reviewed and discussed with the patient, and pt. wife all questions and concerns answered. Copy of instructions and scripts given to patient.   Stavros, Barnhardt Z  Home Medication Instructions [id number]   Printed on:04/20/12 1556  Medication Information                    atenolol (TENORMIN) 50 MG tablet Take 50 mg by mouth 2 (two) times daily.            olmesartan (BENICAR) 40 MG tablet Take 20 mg by mouth daily.           glimepiride (AMARYL) 4 MG tablet Take 4 mg by mouth every evening.            amLODipine (NORVASC) 5 MG tablet Take 5 mg by mouth daily.           co-enzyme Q-10 50 MG capsule Take 50 mg by mouth daily.           omega-3 acid ethyl esters (LOVAZA) 1 G capsule Take 1 g by mouth every morning.           folic acid (FOLVITE) 1 MG tablet Take 1 mg by mouth daily.           b complex vitamins tablet Take 1 tablet by mouth daily.           selenium 50 MCG TABS Take 50 mcg by mouth daily.           linagliptin (TRADJENTA) 5 MG TABS tablet Take 5 mg by mouth every morning.           Cholecalciferol 2000 UNITS TABS Take 2,000 Units by mouth daily.           Turmeric Curcumin 500 MG CAPS Take 500 mg by mouth daily.           escitalopram (LEXAPRO) 20 MG tablet Take 20 mg by mouth daily.           memantine (NAMENDA) 10 MG tablet Take 20 mg by mouth daily.           furosemide (LASIX) 20 MG tablet Take 20 mg by mouth daily.           sertraline (ZOLOFT) 25 MG tablet Take 50 mg by mouth At bedtime.           prednisoLONE acetate (PRED FORTE) 1 % ophthalmic suspension Place 1 drop into both eyes 2 (two) times daily.              Patients skin is clean, dry and intact, no evidence of skin break down. IV site discontinued and catheter remains intact. Site without signs and symptoms of complications. Dressing and pressure applied.  Explained how to access mychart.  Patient  escorted to car by NT in a wheelchair,  no distress noted upon discharge.  [name], Lekeisha Arenas C 04/20/2012 3:56 PM

## 2012-04-20 NOTE — Plan of Care (Signed)
Problem: Phase I Progression Outcomes Goal: Initial discharge plan identified Outcome: Completed/Met Date Met:  04/20/12 To return home with wife

## 2012-04-20 NOTE — Plan of Care (Signed)
Problem: Consults Goal: Diagnosis - Venous Thromboembolism (VTE) Choose a selection  Outcome: Not Applicable Date Met:  04/20/12 DVT (Deep Vein Thrombosis)

## 2012-04-20 NOTE — Care Management Note (Signed)
    Page 1 of 1   04/20/2012     6:38:34 PM   CARE MANAGEMENT NOTE 04/20/2012  Patient:  [name], [name]   Account Number:  [id number]  Date Initiated:  04/20/2012  Documentation initiated by:  Letha Cape  Subjective/Objective Assessment:   dx dvt  admit- lives with spouse, pta independent     Action/Plan:   Anticipated DC Date:  04/20/2012   Anticipated DC Plan:  HOME/SELF CARE      DC Planning Services  CM consult      PAC Choice  DURABLE MEDICAL EQUIPMENT   Choice offered to / List presented to:  C-3 Spouse   DME arranged  3-N-1      DME agency  [facility]        Status of service:  Completed, signed off Medicare Important Message given?   (If response is "NO", the following Medicare IM given date fields will be blank) Date Medicare IM given:   Date Additional Medicare IM given:    Discharge Disposition:  HOME/SELF CARE  Per UR Regulation:  Reviewed for med. necessity/level of care/duration of stay  If discussed at Long Length of Stay Meetings, dates discussed:    Comments:  04/20/12 18:36 Letha Cape RN, BSN [phone] patient lives with spouse, pta independent.  Patient has medication coverage and transportation.  Patient wants a bsc,  would like to get bsc from [facility], referral made to [facility]. Patient already had outp pt and will resume outpt pt, MD will put a referral in to resume outpt pt.

## 2012-04-24 ENCOUNTER — Encounter: Payer: Medicare Other | Admitting: Physical Therapy

## 2012-04-26 ENCOUNTER — Ambulatory Visit: Payer: Medicare Other | Admitting: Physical Therapy

## 2012-05-01 ENCOUNTER — Encounter: Payer: Medicare Other | Admitting: Physical Therapy

## 2012-05-03 ENCOUNTER — Encounter: Payer: Medicare Other | Admitting: Physical Therapy

## 2012-05-05 ENCOUNTER — Encounter: Payer: Medicare Other | Admitting: Physical Therapy

## 2012-07-10 ENCOUNTER — Other Ambulatory Visit (HOSPITAL_COMMUNITY): Payer: Self-pay | Admitting: Urology

## 2012-07-10 DIAGNOSIS — C61 Malignant neoplasm of prostate: Secondary | ICD-10-CM

## 2012-07-11 ENCOUNTER — Encounter (HOSPITAL_COMMUNITY): Payer: Medicare Other

## 2012-07-11 ENCOUNTER — Encounter (HOSPITAL_COMMUNITY): Admission: RE | Admit: 2012-07-11 | Payer: Medicare Other | Source: Ambulatory Visit

## 2012-07-12 ENCOUNTER — Encounter (HOSPITAL_COMMUNITY)
Admission: RE | Admit: 2012-07-12 | Discharge: 2012-07-12 | Disposition: A | Discharge status: Home / Self Care | Payer: Medicare Other | Source: Ambulatory Visit | Attending: Urology | Admitting: Urology

## 2012-07-12 ENCOUNTER — Encounter (HOSPITAL_COMMUNITY): Payer: Medicare Other

## 2012-07-12 DIAGNOSIS — C61 Malignant neoplasm of prostate: Secondary | ICD-10-CM | POA: Insufficient documentation

## 2012-07-12 MED ORDER — TECHNETIUM TC 99M MEDRONATE IV KIT
25.7000 | PACK | Freq: Once | INTRAVENOUS | Status: AC | PRN
  Administered 2012-07-12: 25.7 via INTRAVENOUS

## 2012-07-14 ENCOUNTER — Other Ambulatory Visit (HOSPITAL_COMMUNITY): Payer: Self-pay | Admitting: Urology

## 2012-07-14 ENCOUNTER — Ambulatory Visit (HOSPITAL_COMMUNITY)
Admission: RE | Admit: 2012-07-14 | Discharge: 2012-07-14 | Disposition: A | Discharge status: Home / Self Care | Payer: Medicare Other | Source: Ambulatory Visit | Attending: Urology | Admitting: Urology

## 2012-07-14 DIAGNOSIS — C61 Malignant neoplasm of prostate: Secondary | ICD-10-CM | POA: Insufficient documentation

## 2012-07-14 DIAGNOSIS — M6580 Other synovitis and tenosynovitis, unspecified site: Secondary | ICD-10-CM | POA: Insufficient documentation

## 2013-01-03 ENCOUNTER — Other Ambulatory Visit: Payer: Self-pay | Admitting: *Deleted

## 2013-01-03 MED ORDER — MEMANTINE HCL ER 28 MG PO CP24: 1.0000 | ORAL_CAPSULE | Freq: Once | ORAL | Status: DC

## 2013-01-23 ENCOUNTER — Other Ambulatory Visit: Payer: Self-pay | Admitting: Internal Medicine

## 2013-03-21 ENCOUNTER — Other Ambulatory Visit: Payer: Self-pay | Admitting: Urology

## 2013-03-21 ENCOUNTER — Other Ambulatory Visit (HOSPITAL_COMMUNITY): Payer: Self-pay | Admitting: Urology

## 2013-03-21 ENCOUNTER — Ambulatory Visit (HOSPITAL_COMMUNITY)
Admission: RE | Admit: 2013-03-21 | Discharge: 2013-03-21 | Disposition: A | Discharge status: Home / Self Care | Payer: Medicare Other | Source: Ambulatory Visit | Attending: Urology | Admitting: Urology

## 2013-03-21 DIAGNOSIS — S8002XA Contusion of left knee, initial encounter: Secondary | ICD-10-CM

## 2013-03-21 DIAGNOSIS — S8000XA Contusion of unspecified knee, initial encounter: Secondary | ICD-10-CM | POA: Insufficient documentation

## 2013-03-21 DIAGNOSIS — W19XXXA Unspecified fall, initial encounter: Secondary | ICD-10-CM | POA: Insufficient documentation

## 2013-03-21 DIAGNOSIS — M25469 Effusion, unspecified knee: Secondary | ICD-10-CM | POA: Insufficient documentation

## 2013-04-06 ENCOUNTER — Other Ambulatory Visit: Payer: Self-pay | Admitting: Specialist

## 2013-04-06 DIAGNOSIS — M25562 Pain in left knee: Secondary | ICD-10-CM

## 2013-04-11 ENCOUNTER — Ambulatory Visit
Admission: RE | Admit: 2013-04-11 | Discharge: 2013-04-11 | Disposition: A | Discharge status: Home / Self Care | Payer: Medicare Other | Source: Ambulatory Visit | Attending: Specialist | Admitting: Specialist

## 2013-04-11 DIAGNOSIS — M25562 Pain in left knee: Secondary | ICD-10-CM

## 2013-05-04 ENCOUNTER — Ambulatory Visit (INDEPENDENT_AMBULATORY_CARE_PROVIDER_SITE_OTHER): Payer: Medicare Other | Admitting: *Deleted

## 2013-05-04 DIAGNOSIS — I441 Atrioventricular block, second degree: Secondary | ICD-10-CM

## 2013-05-04 LAB — PACEMAKER DEVICE OBSERVATION
AL AMPLITUDE: 0.7 mv
AL THRESHOLD: 1.25 V
BAMS-0001: 150 {beats}/min
RV LEAD THRESHOLD: 1 V

## 2013-05-04 NOTE — Progress Notes (Signed)
Pacemaker check in clinic. Normal device function. Thresholds, sensing, impedances consistent with previous measurements. Device programmed to maximize longevity. 24 mode switches, all < 1 minute.  No high ventricular rates noted. Device programmed at appropriate safety margins. Histogram distribution appropriate for patient activity level. Device programmed to optimize intrinsic conduction. Estimated longevity 8.5 years. Patient education completed.  ROV in 3 months with Dr. Ladona Ridgel.

## 2013-05-05 ENCOUNTER — Other Ambulatory Visit: Payer: Self-pay | Admitting: Internal Medicine

## 2013-05-23 ENCOUNTER — Encounter: Payer: Self-pay | Admitting: Internal Medicine

## 2013-06-02 ENCOUNTER — Encounter: Payer: Self-pay | Admitting: *Deleted

## 2013-06-02 ENCOUNTER — Encounter: Payer: Self-pay | Admitting: Interventional Cardiology

## 2013-06-04 ENCOUNTER — Ambulatory Visit (INDEPENDENT_AMBULATORY_CARE_PROVIDER_SITE_OTHER): Payer: Medicare Other | Admitting: Interventional Cardiology

## 2013-06-04 ENCOUNTER — Encounter: Payer: Self-pay | Admitting: Interventional Cardiology

## 2013-06-04 VITALS — BP 122/78 | HR 74 | Ht 70.0 in | Wt 161.0 lb

## 2013-06-04 DIAGNOSIS — I2510 Atherosclerotic heart disease of native coronary artery without angina pectoris: Secondary | ICD-10-CM

## 2013-06-04 NOTE — Patient Instructions (Signed)
Your physician recommends that you continue on your current medications as directed. Please refer to the Current Medication list given to you today.  Your physician wants you to follow-up in: 1 year. You will receive a reminder letter in the mail two months in advance. If you don't receive a letter, please call our office to schedule the follow-up appointment.  

## 2013-06-04 NOTE — Progress Notes (Signed)
Patient ID: [name], male   DOB: [birth year], 79 y.o.   MRN: [id number]    [address], [address] [place], [place]  [address] Phone: [phone] Fax:  [phone]  Date:  06/04/2013   ID:  [name], DOB Jul 04, 1923, MRN [id number]  PCP:  Katy Apo, MD   ASSESSMENT:  1. Coronary artery disease, stable without angina 2. AV node disease with DDD pacemaker, functioning normally 3. Hypertension, under control  PLAN:  1. Clinical followup in one year 2. Encouraged physical activity   SUBJECTIVE: [name] is a 79 y.o. male who has no complaints. He has gained weight. He denies chest pain, dyspnea, orthopnea, palpitations, transient neurological complaints and other acute CV symptoms. Conversation is minimal. Ms. Bollig states that he sleeps more than he is up and active. His appetite is good.   Wt Readings from Last 3 Encounters:  06/04/13 161 lb (73.029 kg)  04/18/12 153 lb 3.5 oz (69.5 kg)  11/16/11 150 lb 12.7 oz (68.4 kg)     Past Medical History  Diagnosis Date  . Hypertension   . Diabetes mellitus   . Pacemaker   . Left leg DVT   . CKD (chronic kidney disease)   . Depression   . HTN (hypertension)   . Gait abnormality   . CAD (coronary artery disease)     with prior CABG 1991 and 2006 LIMA LAD, seq. SVG D1-Cfx, SVG d2,and seq. SVG AM-PDA Cardiology Dr. Katrinka Blazing    Current Outpatient Prescriptions  Medication Sig Dispense Refill  . acetaminophen-codeine (TYLENOL #3) 300-30 MG per tablet 1 tablet every 4 (four) hours as needed.       Marland Kitchen amLODipine (NORVASC) 5 MG tablet Take 5 mg by mouth daily.      Marland Kitchen atenolol (TENORMIN) 50 MG tablet Take 50 mg by mouth 2 (two) times daily.       . Cholecalciferol 2000 UNITS TABS Take 5,000 Units by mouth daily.       Marland Kitchen co-enzyme Q-10 50 MG capsule Take 50 mg by mouth daily.      . folic acid (FOLVITE) 1 MG tablet Take 1 mg by mouth daily.      . furosemide (LASIX) 20 MG tablet Take 20 mg by mouth  daily.      Marland Kitchen glimepiride (AMARYL) 4 MG tablet Take 4 mg by mouth every evening.       . Glucose Blood (FREESTYLE LITE TEST VI)       . HYDROcodone-acetaminophen (NORCO/VICODIN) 5-325 MG per tablet Take 1 tablet by mouth every 4 (four) hours as needed.       Marland Kitchen LANTUS SOLOSTAR 100 UNIT/ML SOPN 16 units      . linagliptin (TRADJENTA) 5 MG TABS tablet Take 5 mg by mouth every morning.      Marland Kitchen NAMENDA XR 28 MG CP24 TAKE 1 CAPSULE BY MOUTH EVERY DAY FOR MEMORY  30 capsule  0  . olmesartan (BENICAR) 40 MG tablet Take 20 mg by mouth daily.      Marland Kitchen omega-3 acid ethyl esters (LOVAZA) 1 G capsule Take 1 g by mouth every morning.      . selenium 50 MCG TABS Take 50 mcg by mouth daily.      . sertraline (ZOLOFT) 25 MG tablet Take 25 mg by mouth At bedtime.        No current facility-administered medications for this visit.    Allergies:    Allergies  Allergen Reactions  .  Iohexol Anaphylaxis     Desc: RN states pt stated he had anyphalactic shock reaction to contrast approx 10 years ago.     Social History:  The patient  reports that he has never smoked. He does not have any smokeless tobacco history on file. He reports that he does not drink alcohol or use illicit drugs.   ROS:  Please see the history of present illness.      All other systems reviewed and negative.   OBJECTIVE: VS:  BP 122/78  Pulse 74  Ht 5\' 10"  (1.778 m)  Wt 161 lb (73.029 kg)  BMI 23.10 kg/m2 Well nourished, well developed, in no acute distress, has gained weight, appears disoriented HEENT: normal Neck: JVD flat. Carotid bruit absent  Cardiac:  normal S1, S2; RRR; no murmur Lungs:  clear to auscultation bilaterally, no wheezing, rhonchi or rales Abd: soft, nontender, no hepatomegaly Ext: Edema absent. Pulses 2+ bilateral Skin: warm and dry Neuro:  CNs 2-12 intact, no focal abnormalities noted  EKG:  AV sequential pacing.       Signed, Darci Needle III, MD 06/04/2013 2:58 PM

## 2013-08-14 ENCOUNTER — Encounter: Payer: Self-pay | Admitting: *Deleted

## 2013-09-05 ENCOUNTER — Encounter: Payer: Self-pay | Admitting: Cardiology

## 2013-09-05 ENCOUNTER — Ambulatory Visit (INDEPENDENT_AMBULATORY_CARE_PROVIDER_SITE_OTHER): Payer: Medicare Other | Admitting: Cardiology

## 2013-09-05 VITALS — BP 146/80 | HR 64 | Ht 70.0 in | Wt 160.0 lb

## 2013-09-05 DIAGNOSIS — Z950 Presence of cardiac pacemaker: Secondary | ICD-10-CM

## 2013-09-05 DIAGNOSIS — I495 Sick sinus syndrome: Secondary | ICD-10-CM

## 2013-09-05 DIAGNOSIS — I441 Atrioventricular block, second degree: Secondary | ICD-10-CM

## 2013-09-05 NOTE — Patient Instructions (Signed)
Your physician wants you to follow-up in: [place] will receive a reminder letter in the mail two months in advance. If you don't receive a letter, please call our office to schedule the follow-up appointment.

## 2013-09-05 NOTE — Progress Notes (Signed)
Patient ID: IKAIKA SHOWERS MRN: [id number], DOB/AGE: 1922-10-31   Date of Visit: 09/05/2013  Primary Physician: Seward Carol, MD Primary Cardiologist: Daneen Schick, MD Primary EP: new to EP - will be Lovena Le, MD Reason for Visit: establish EP care  History of Present Illness  RAFAN SANDERS is a 80 y.o. male with sinus node dysfunction and Mobitz II AV block s/p PPM implant and CAD who presents today for electrophysiology followup. He is accompanied by his wife. He reports he is doing well and has no complaints. He denies chest pain or shortness of breath. He denies palpitations, dizziness, near syncope or syncope. He denies LE swelling, orthopnea or PND. He is compliant and tolerating medications without difficulty.  Past Medical History Past Medical History  Diagnosis Date  . Hypertension   . Diabetes mellitus   . Pacemaker   . Left leg DVT   . CKD (chronic kidney disease)   . Depression   . HTN (hypertension)   . Gait abnormality   . CAD (coronary artery disease)     with prior CABG 1991 and 2006 LIMA LAD, seq. SVG D1-Cfx, SVG d2,and seq. SVG AM-PDA Cardiology Dr. Tamala Julian    Past Surgical History Past Surgical History  Procedure Laterality Date  . Prostatectomy    . Coronary artery bypass graft      Allergies/Intolerances Allergies  Allergen Reactions  . Iohexol Anaphylaxis     Desc: RN states pt stated he had anyphalactic shock reaction to contrast approx 10 years ago.     Current Home Medications Current Outpatient Prescriptions  Medication Sig Dispense Refill  . acetaminophen-codeine (TYLENOL #3) 300-30 MG per tablet 1 tablet every 4 (four) hours as needed.       Marland Kitchen amLODipine (NORVASC) 5 MG tablet Take 5 mg by mouth daily.      Marland Kitchen atenolol (TENORMIN) 50 MG tablet Take 50 mg by mouth 2 (two) times daily.       . Cholecalciferol 2000 UNITS TABS Take 5,000 Units by mouth daily.       Marland Kitchen co-enzyme Q-10 50 MG capsule Take 50 mg by mouth daily.      . folic acid  (FOLVITE) 1 MG tablet Take 1 mg by mouth daily.      . furosemide (LASIX) 20 MG tablet Take 20 mg by mouth daily.      Marland Kitchen glimepiride (AMARYL) 4 MG tablet Take 4 mg by mouth every evening.       . Glucose Blood (FREESTYLE LITE TEST VI)       . LANTUS SOLOSTAR 100 UNIT/ML SOPN 16 units      . linagliptin (TRADJENTA) 5 MG TABS tablet Take 5 mg by mouth every morning.      Marland Kitchen NAMENDA XR 28 MG CP24 TAKE 1 CAPSULE BY MOUTH EVERY DAY FOR MEMORY  30 capsule  0  . olmesartan (BENICAR) 40 MG tablet Take 20 mg by mouth daily.      Marland Kitchen omega-3 acid ethyl esters (LOVAZA) 1 G capsule Take 1 g by mouth every morning.      Marland Kitchen omeprazole (PRILOSEC) 40 MG capsule       . selenium 50 MCG TABS Take 50 mcg by mouth daily.      . sertraline (ZOLOFT) 25 MG tablet Take 25 mg by mouth At bedtime.        No current facility-administered medications for this visit.    Social History History   Social History  . Marital Status: Married  Spouse Name: N/A    Number of Children: N/A  . Years of Education: N/A   Occupational History  . Not on file.   Social History Main Topics  . Smoking status: Never Smoker   . Smokeless tobacco: Not on file  . Alcohol Use: No  . Drug Use: No  . Sexual Activity:    Other Topics Concern  . Not on file   Social History Narrative  . No narrative on file     Review of Systems General: No chills, fever, night sweats or weight changes Cardiovascular: No chest pain, dyspnea on exertion, edema, orthopnea, palpitations, paroxysmal nocturnal dyspnea Dermatological: No rash, lesions or masses Respiratory: No cough, dyspnea Urologic: No hematuria, dysuria Abdominal: No nausea, vomiting, diarrhea, bright red blood per rectum, melena, or hematemesis Neurologic: No visual changes, weakness, changes in mental status All other systems reviewed and are otherwise negative except as noted above.  Physical Exam Vitals: Blood pressure 146/80, pulse 64, height 5\' 10"  (1.778 m), weight  160 lb (72.576 kg).  General: Well developed, well appearing 80 y.o. male in no acute distress. HEENT: Normocephalic, atraumatic. EOMs intact. Sclera nonicteric. Oropharynx clear.  Neck: Supple. No JVD. Lungs: Respirations regular and unlabored, CTA bilaterally. No wheezes, rales or rhonchi. Heart: RRR. S1, S2 present. No murmurs, rub, S3 or S4. Abdomen: Soft, non-distended.  Extremities: No clubbing, cyanosis or edema. PT/Radials 2+ and equal bilaterally. Psych: Normal affect. Neuro: Alert and oriented X 3. Moves all extremities spontaneously.   Diagnostics Device interrogation today - Normal device function. Battery voltage 2.8. Remaining longevity 8 years. Pacer dependent, VP 100% of time. Sensing, thresholds and impedances stable. 25 mode switch episodes, <0.1% of time, 0 atrial high rate episodes. No ventricular high rate episodes. No programming changes made. See PaceArt report for full details.   Assessment and Plan 1. Sinus node dysfunction and Mobitz II AV block s/p PPM implant - normal device function - pacer dependent - no programming changes made - see PaceArt report - return for device clinic follow-up in 6 months - return for follow-up with Dr. Lovena Le in one year  Signed, Ileene Hutchinson, PA-C 09/05/2013, 2:47 PM

## 2013-09-06 LAB — MDC_IDC_ENUM_SESS_TYPE_INCLINIC
Battery Impedance: 209 Ohm
Battery Remaining Longevity: 99 mo
Battery Voltage: 2.8 V
Brady Statistic AS VP Percent: 10 %
Lead Channel Impedance Value: 498 Ohm
Lead Channel Pacing Threshold Amplitude: 1 V
Lead Channel Sensing Intrinsic Amplitude: 2.8 mV
Lead Channel Setting Pacing Amplitude: 2 V
Lead Channel Setting Pacing Amplitude: 2.75 V
Lead Channel Setting Pacing Pulse Width: 0.4 ms
MDC IDC MSMT LEADCHNL RA PACING THRESHOLD AMPLITUDE: 1 V
MDC IDC MSMT LEADCHNL RA PACING THRESHOLD PULSEWIDTH: 0.4 ms
MDC IDC MSMT LEADCHNL RV IMPEDANCE VALUE: 438 Ohm
MDC IDC MSMT LEADCHNL RV PACING THRESHOLD PULSEWIDTH: 0.4 ms
MDC IDC SESS DTM: 20150204202628
MDC IDC SET LEADCHNL RV SENSING SENSITIVITY: 2.8 mV
MDC IDC STAT BRADY AP VP PERCENT: 90 %
MDC IDC STAT BRADY AP VS PERCENT: 0 %
MDC IDC STAT BRADY AS VS PERCENT: 0 %

## 2013-10-08 ENCOUNTER — Encounter: Payer: Self-pay | Admitting: Internal Medicine

## 2014-03-07 ENCOUNTER — Ambulatory Visit (INDEPENDENT_AMBULATORY_CARE_PROVIDER_SITE_OTHER): Payer: Medicare Other | Admitting: *Deleted

## 2014-03-07 ENCOUNTER — Encounter: Payer: Self-pay | Admitting: Internal Medicine

## 2014-03-07 DIAGNOSIS — I441 Atrioventricular block, second degree: Secondary | ICD-10-CM

## 2014-03-07 DIAGNOSIS — Z950 Presence of cardiac pacemaker: Secondary | ICD-10-CM

## 2014-03-07 LAB — MDC_IDC_ENUM_SESS_TYPE_INCLINIC
Battery Voltage: 2.79 V
Brady Statistic AP VS Percent: 0 %
Brady Statistic AS VS Percent: 0 %
Lead Channel Pacing Threshold Amplitude: 1 V
Lead Channel Pacing Threshold Amplitude: 1.25 V
Lead Channel Pacing Threshold Pulse Width: 0.4 ms
Lead Channel Pacing Threshold Pulse Width: 0.52 ms
Lead Channel Setting Pacing Pulse Width: 0.4 ms
MDC IDC MSMT BATTERY IMPEDANCE: 257 Ohm
MDC IDC MSMT BATTERY REMAINING LONGEVITY: 96 mo
MDC IDC MSMT LEADCHNL RA IMPEDANCE VALUE: 476 Ohm
MDC IDC MSMT LEADCHNL RA SENSING INTR AMPL: 0.7 mV
MDC IDC MSMT LEADCHNL RV IMPEDANCE VALUE: 448 Ohm
MDC IDC SESS DTM: 20150806100958
MDC IDC SET LEADCHNL RA PACING AMPLITUDE: 2.5 V
MDC IDC SET LEADCHNL RV PACING AMPLITUDE: 2 V
MDC IDC SET LEADCHNL RV SENSING SENSITIVITY: 2.8 mV
MDC IDC STAT BRADY AP VP PERCENT: 95 %
MDC IDC STAT BRADY AS VP PERCENT: 5 %

## 2014-03-07 NOTE — Progress Notes (Signed)
Pacemaker check in clinic. Normal device function. Thresholds, sensing, impedances consistent with previous measurements. Device programmed to maximize longevity. 36 mode switches--- <0.1%. 1 NSVT---10 beats @155bpm . Device programmed at appropriate safety margins---changed atrial pulse width from 0.4 to 0.95ms. Histogram distribution appropriate for patient activity level. Device programmed to optimize intrinsic conduction. Estimated longevity 66yrs. Pt newly enrolled in [place]. ROV w/ Dr. Lovena Le in 82mo.

## 2014-06-06 ENCOUNTER — Ambulatory Visit (INDEPENDENT_AMBULATORY_CARE_PROVIDER_SITE_OTHER): Payer: Medicare Other | Admitting: Interventional Cardiology

## 2014-06-06 ENCOUNTER — Encounter: Payer: Self-pay | Admitting: Interventional Cardiology

## 2014-06-06 VITALS — BP 136/64 | HR 69 | Ht 70.0 in | Wt 159.0 lb

## 2014-06-06 DIAGNOSIS — F0390 Unspecified dementia without behavioral disturbance: Secondary | ICD-10-CM | POA: Insufficient documentation

## 2014-06-06 DIAGNOSIS — I25810 Atherosclerosis of coronary artery bypass graft(s) without angina pectoris: Secondary | ICD-10-CM

## 2014-06-06 DIAGNOSIS — E785 Hyperlipidemia, unspecified: Secondary | ICD-10-CM

## 2014-06-06 DIAGNOSIS — Z950 Presence of cardiac pacemaker: Secondary | ICD-10-CM

## 2014-06-06 DIAGNOSIS — I10 Essential (primary) hypertension: Secondary | ICD-10-CM

## 2014-06-06 NOTE — Patient Instructions (Signed)
Your physician recommends that you continue on your current medications as directed. Please refer to the Current Medication list given to you today.  Your physician wants you to follow-up in: 1 year with Dr.Smith You will receive a reminder letter in the mail two months in advance. If you don't receive a letter, please call our office to schedule the follow-up appointment.  

## 2014-06-06 NOTE — Progress Notes (Signed)
Patient ID: [name], male   DOB: April 30, 1923, 80 y.o.   MRN: [id number]    [address], [address] [place], [place]  [address] Phone: [phone] Fax:  [phone]  Date:  06/06/2014   ID:  AYOMIKUN STARLING, DOB 1922/11/24, MRN [id number]  PCP:  Kandice Hams, MD   ASSESSMENT:  1. Coronary artery disease, status post bypass surgery 2. Hypertension, essential, controlled 3. Hyperlipidemia 4. High-grade AV node disease with DDD pacemaker 5. Dementia  PLAN:  1. No clinical evidence of congestive heart failure. There is dyspnea on exertion that I feel is related to deconditioning as he has become very sedentary. I encouraged increased physical activity. 2. Low salt diet 3. Call if angina, dyspnea, edema, or syncope.   SUBJECTIVE: [name] is a 80 y.o. 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 He is a, and by his wife. She is concerned that he stays in bed all the time. He reads. He will watch television. He doesn't do any significant physical activity. Sheis dyspneic with physical activity such as walking to the kitchen from the bedroom. He has not had syncope or palpitations. He denies edema. He has not needed anginal therapy.   Wt Readings from Last 3 Encounters:  09/05/13 160 lb (72.576 kg)  06/04/13 161 lb (73.029 kg)  04/18/12 153 lb 3.5 oz (69.5 kg)     Past Medical History  Diagnosis Date  . Hypertension   . Diabetes mellitus   . Pacemaker   . Left leg DVT   . CKD (chronic kidney disease)   . Depression   . HTN (hypertension)   . Gait abnormality   . CAD (coronary artery disease)     with prior CABG 1991 and 2006 LIMA LAD, seq. SVG D1-Cfx, SVG d2,and seq. SVG AM-PDA Cardiology Dr. Tamala Julian    Current Outpatient Prescriptions  Medication Sig Dispense Refill  . acetaminophen-codeine (TYLENOL #3) 300-30 MG per tablet 1 tablet every 4 (four) hours as needed.     Marland Kitchen amLODipine (NORVASC) 5 MG tablet Take 5 mg by mouth daily.    Marland Kitchen atenolol  (TENORMIN) 50 MG tablet Take 50 mg by mouth 2 (two) times daily.     . Cholecalciferol 2000 UNITS TABS Take 5,000 Units by mouth daily.     Marland Kitchen co-enzyme Q-10 50 MG capsule Take 100 mg by mouth daily.     . folic acid (FOLVITE) 1 MG tablet Take 1 mg by mouth daily.    . furosemide (LASIX) 20 MG tablet Take 20 mg by mouth daily.    Marland Kitchen glimepiride (AMARYL) 4 MG tablet Take 4 mg by mouth every evening.     . Glucose Blood (FREESTYLE LITE TEST VI)     . LANTUS SOLOSTAR 100 UNIT/ML SOPN 16 units    . linagliptin (TRADJENTA) 5 MG TABS tablet Take 5 mg by mouth every morning.    . memantine (NAMENDA) 10 MG tablet Take 10 mg by mouth 2 (two) times daily.   5  . olmesartan (BENICAR) 40 MG tablet Take 20 mg by mouth daily.    Marland Kitchen selenium 50 MCG TABS Take 50 mcg by mouth daily.    . sertraline (ZOLOFT) 25 MG tablet Take 50 mg by mouth At bedtime.     Marland Kitchen omega-3 acid ethyl esters (LOVAZA) 1 G capsule Take 1 g by mouth every morning.    Marland Kitchen omeprazole (PRILOSEC) 40 MG capsule      No current facility-administered medications for  this visit.    Allergies:    Allergies  Allergen Reactions  . Iodinated Diagnostic Agents Anaphylaxis  . Iohexol Anaphylaxis     Desc: RN states pt stated he had anyphalactic shock reaction to contrast approx 10 years ago.   . Ace Inhibitors   . Simvastatin     Social History:  The patient  reports that he has never smoked. He does not have any smokeless tobacco history on file. He reports that he does not drink alcohol or use illicit drugs.   ROS:  Please see the history of present illness.   Appetite is adequate. Memory is decreased. Hearing is decreased. No edema. No episodes of syncope.   All other systems reviewed and negative.   OBJECTIVE: VS:  There were no vitals taken for this visit. Well nourished, well developed, in no acute distress, elderly HEENT: normal Neck: JVD flat. Carotid bruit absent  Cardiac:  normal S1, S2; RRR; no murmur Lungs:  clear to  auscultation bilaterally, no wheezing, rhonchi or rales Abd: soft, nontender, no hepatomegaly Ext: Edema absent. Pulses 2+ but trace in the posterior tibial bilateral Skin: warm and dry Neuro:  CNs 2-12 intact, no focal abnormalities noted  EKG:  AV sequential pacing       Signed, Illene Labrador III, MD 06/06/2014 2:50 PM

## 2014-10-01 ENCOUNTER — Other Ambulatory Visit: Payer: Self-pay

## 2014-10-01 ENCOUNTER — Encounter: Payer: Self-pay | Admitting: Internal Medicine

## 2014-12-03 ENCOUNTER — Encounter: Payer: Self-pay | Admitting: Internal Medicine

## 2014-12-24 ENCOUNTER — Other Ambulatory Visit: Payer: Self-pay

## 2014-12-26 ENCOUNTER — Ambulatory Visit (INDEPENDENT_AMBULATORY_CARE_PROVIDER_SITE_OTHER): Payer: Medicare Other | Admitting: Internal Medicine

## 2014-12-26 ENCOUNTER — Encounter: Payer: Self-pay | Admitting: Internal Medicine

## 2014-12-26 ENCOUNTER — Other Ambulatory Visit: Payer: Self-pay

## 2014-12-26 VITALS — BP 150/82 | HR 86 | Ht 70.0 in | Wt 150.6 lb

## 2014-12-26 DIAGNOSIS — I10 Essential (primary) hypertension: Secondary | ICD-10-CM | POA: Diagnosis not present

## 2014-12-26 DIAGNOSIS — I2583 Coronary atherosclerosis due to lipid rich plaque: Secondary | ICD-10-CM

## 2014-12-26 DIAGNOSIS — I2510 Atherosclerotic heart disease of native coronary artery without angina pectoris: Secondary | ICD-10-CM | POA: Diagnosis not present

## 2014-12-26 DIAGNOSIS — I480 Paroxysmal atrial fibrillation: Secondary | ICD-10-CM | POA: Diagnosis not present

## 2014-12-26 DIAGNOSIS — Z950 Presence of cardiac pacemaker: Secondary | ICD-10-CM | POA: Diagnosis not present

## 2014-12-26 LAB — CUP PACEART INCLINIC DEVICE CHECK
Date Time Interrogation Session: 20160526180012
Lead Channel Pacing Threshold Amplitude: 1 V
Lead Channel Pacing Threshold Pulse Width: 0.4 ms
MDC IDC MSMT LEADCHNL RA PACING THRESHOLD PULSEWIDTH: 0.64 ms
MDC IDC MSMT LEADCHNL RA SENSING INTR AMPL: 1.4 mV
MDC IDC MSMT LEADCHNL RV PACING THRESHOLD AMPLITUDE: 1 V
MDC IDC MSMT LEADCHNL RV SENSING INTR AMPL: 15.68 mV

## 2014-12-26 NOTE — Assessment & Plan Note (Signed)
His medtronic DDD PM is working normally. Will recheck in several months. 

## 2014-12-26 NOTE — Assessment & Plan Note (Signed)
No anginal symptoms. Will continue his current meds.

## 2014-12-26 NOTE — Assessment & Plan Note (Signed)
This is a new problem. I have discussed the treatment options as they pertain to thromboembolic risk. He is going out of rhythm for minutes at a time but no hours. I have contacted his primary cardiologist Dr. Tamala Julian and he would like to discuss the pro's and con's of anti-coagulation with the patient and his family. He denies any recent falls.

## 2014-12-26 NOTE — Progress Notes (Signed)
HPI  Allergies  Allergen Reactions  . Iodinated Diagnostic Agents Anaphylaxis  . Iohexol Anaphylaxis     Desc: RN states pt stated he had anyphalactic shock reaction to contrast approx 10 years ago.   . Ace Inhibitors     UNKNOWN  . Simvastatin     Muscle aches     Current Outpatient Prescriptions  Medication Sig Dispense Refill  . acetaminophen-codeine (TYLENOL #3) 300-30 MG per tablet Take 1 tablet by mouth every 4 (four) hours as needed (pain).     Marland Kitchen amLODipine (NORVASC) 5 MG tablet Take 5 mg by mouth daily.    Marland Kitchen atenolol (TENORMIN) 50 MG tablet Take 50 mg by mouth 2 (two) times daily.     Marland Kitchen buPROPion (WELLBUTRIN) 100 MG tablet Take 1 tablet by mouth daily.  2  . Cholecalciferol 2000 UNITS TABS Take 5,000 Units by mouth daily.     Marland Kitchen co-enzyme Q-10 50 MG capsule Take 100 mg by mouth daily.     . folic acid (FOLVITE) 1 MG tablet Take 1 mg by mouth daily.    . furosemide (LASIX) 20 MG tablet Take 20 mg by mouth daily.    Marland Kitchen glimepiride (AMARYL) 4 MG tablet Take 4 mg by mouth every evening.     . Glucose Blood (FREESTYLE LITE TEST VI)     . hydrALAZINE (APRESOLINE) 50 MG tablet Take 1 tablet by mouth 3 (three) times daily.  2  . LANTUS SOLOSTAR 100 UNIT/ML SOPN Inject 16 Units into the skin daily at 10 pm.     . linagliptin (TRADJENTA) 5 MG TABS tablet Take 5 mg by mouth every morning.    . memantine (NAMENDA) 5 MG tablet Take 1 tablet by mouth 2 (two) times daily.  2  . mirtazapine (REMERON) 15 MG tablet Take 1 tablet by mouth at bedtime.  3  . olmesartan (BENICAR) 40 MG tablet Take 20 mg by mouth daily.    Marland Kitchen selenium 50 MCG TABS Take 50 mcg by mouth daily.    . sertraline (ZOLOFT) 100 MG tablet Take 1 tablet by mouth at bedtime.  6   No current facility-administered medications for this visit.     Past Medical History  Diagnosis Date  . Hypertension   . Diabetes mellitus   . Pacemaker   . Left leg DVT   . CKD (chronic kidney disease)   . Depression   . HTN  (hypertension)   . Gait abnormality   . CAD (coronary artery disease)     with prior CABG 1991 and 2006 LIMA LAD, seq. SVG D1-Cfx, SVG d2,and seq. SVG AM-PDA Cardiology Dr. Tamala Julian    ROS:   All systems reviewed and negative except as noted in the HPI.   Past Surgical History  Procedure Laterality Date  . Prostatectomy    . Coronary artery bypass graft       No family history on file.   History   Social History  . Marital Status: Married    Spouse Name: N/A  . Number of Children: N/A  . Years of Education: N/A   Occupational History  . Not on file.   Social History Main Topics  . Smoking status: Never Smoker   . Smokeless tobacco: Not on file  . Alcohol Use: No  . Drug Use: No  . Sexual Activity: Not on file   Other Topics Concern  . Not on file   Social History Narrative  BP 150/82 mmHg  Pulse 86  Ht 5\' 10"  (1.778 m)  Wt 150 lb 9.6 oz (68.312 kg)  BMI 21.61 kg/m2  Physical Exam:  elderly appearing 81 yo man, NAD HEENT: Unremarkable Neck:  No JVD, no thyromegally Back:  No CVA tenderness Lungs:  Clear with no wheezes HEART:  Regular rate rhythm, no murmurs, no rubs, no clicks Abd:  soft, positive bowel sounds, no organomegally, no rebound, no guarding Ext:  2 plus pulses, no edema, no cyanosis, no clubbing Skin:  No rashes no nodules Neuro:  CN II through XII intact, motor grossly intact   DEVICE  Normal device function.  See PaceArt for details.   Assess/Plan:

## 2014-12-26 NOTE — Assessment & Plan Note (Signed)
His blood pressure is elevated. He admits to some sodium indiscretion. He will continue his current meds. Will follow.

## 2014-12-26 NOTE — Patient Instructions (Signed)
Medication Instructions:  Your physician recommends that you continue on your current medications as directed. Please refer to the Current Medication list given to you today. Will call you after Dr Lovena Le speaks with Dr Tamala Julian about blood thiner  Labwork: None ordered  Testing/Procedures: None ordered  Follow-Up: Remote monitoring is used to monitor your Pacemaker or ICD from home. This monitoring reduces the number of office visits required to check your device to one time per year. It allows [place] to keep an eye on the functioning of your device to ensure it is working properly. You are scheduled for a device check from home on 03/27/15. You may send your transmission at any time that day. If you have a wireless device, the transmission will be sent automatically. After your physician reviews your transmission, you will receive a postcard with your next transmission date.   Your physician wants you to follow-up in: 12 months with Dr Knox Saliva will receive a reminder letter in the mail two months in advance. If you don't receive a letter, please call our office to schedule the follow-up appointment.   Any Other Special Instructions Will Be Listed Below (If Applicable).

## 2015-01-02 ENCOUNTER — Telehealth: Payer: Self-pay | Admitting: Interventional Cardiology

## 2015-01-02 DIAGNOSIS — Z7901 Long term (current) use of anticoagulants: Secondary | ICD-10-CM

## 2015-01-02 NOTE — Telephone Encounter (Signed)
Returned pt wife call. she sts that during pt home health nurse visit yesterday his O2 stat was 83. Pt has an echo performed during his hospitalization in [place] the showed pt had an EF of 35% Adv pt wife we will need to rqst a copy of of pt hospitalization. Pt wife sts that she did sign a release for them to release info to pt physicians. Pt was hospitalized @ [facility] in [place], [place]. Adv her I will fwd a message to Maudie Mercury in or medical records dept to rqst those records. Adv her that we may need her to sign a release so that we can rqst them.  Pt has an appt with Dr.Smith on 6/10. She is concerned about new dx and would like him seem sooner. Adv her that Dr.Smith is aware, and will call her directly to discuss.

## 2015-01-02 NOTE — Telephone Encounter (Signed)
New Message  Pt wife called states that the Pt's Oxygen level is extremely low. There are a Dr. Fredderick Phenix whom came out who is highly concerned with the "report" ECHO  that showed the heart pumping blood at 35% verses 75 %. Pt wife requests a call back to discuss if the pt will need a sooner appt. Please call

## 2015-01-02 NOTE — Telephone Encounter (Signed)
Start coumadin 2 mg daily. Make appointment for coumadin clinic in 5-7 days. Indication is atrial fibrillation. Also have basic metabolic panel done on the same day as coumadin clinic in case we decide to switch no NOAC.

## 2015-01-03 MED ORDER — WARFARIN SODIUM 2 MG PO TABS: 2.0000 mg | ORAL_TABLET | Freq: Every day | ORAL | Status: DC

## 2015-01-03 NOTE — Telephone Encounter (Signed)
Called patient and his spouse to give Dr. Thompson Caul message. Per Dr. Tamala Julian, patient needs to start coumadin 2 mg by mouth daily for atrial fibrillation. Contacted coumadin clinic for appointment. Ordered and scheduled BMET on same day as office visit. Patient and wife verbalized understanding. All appointments are on next Friday.

## 2015-01-08 ENCOUNTER — Encounter: Payer: Self-pay | Admitting: *Deleted

## 2015-01-10 ENCOUNTER — Telehealth: Payer: Self-pay

## 2015-01-10 ENCOUNTER — Ambulatory Visit (INDEPENDENT_AMBULATORY_CARE_PROVIDER_SITE_OTHER): Payer: Medicare Other | Admitting: *Deleted

## 2015-01-10 ENCOUNTER — Other Ambulatory Visit (INDEPENDENT_AMBULATORY_CARE_PROVIDER_SITE_OTHER): Payer: Medicare Other | Admitting: *Deleted

## 2015-01-10 ENCOUNTER — Encounter: Payer: Self-pay | Admitting: Interventional Cardiology

## 2015-01-10 ENCOUNTER — Ambulatory Visit (INDEPENDENT_AMBULATORY_CARE_PROVIDER_SITE_OTHER): Payer: Medicare Other | Admitting: Interventional Cardiology

## 2015-01-10 VITALS — BP 144/78 | HR 73 | Ht 70.0 in | Wt 151.4 lb

## 2015-01-10 DIAGNOSIS — Z5181 Encounter for therapeutic drug level monitoring: Secondary | ICD-10-CM

## 2015-01-10 DIAGNOSIS — E785 Hyperlipidemia, unspecified: Secondary | ICD-10-CM

## 2015-01-10 DIAGNOSIS — I10 Essential (primary) hypertension: Secondary | ICD-10-CM | POA: Diagnosis not present

## 2015-01-10 DIAGNOSIS — N183 Chronic kidney disease, stage 3 (moderate): Secondary | ICD-10-CM

## 2015-01-10 DIAGNOSIS — I2583 Coronary atherosclerosis due to lipid rich plaque: Secondary | ICD-10-CM

## 2015-01-10 DIAGNOSIS — Z7901 Long term (current) use of anticoagulants: Secondary | ICD-10-CM

## 2015-01-10 DIAGNOSIS — I480 Paroxysmal atrial fibrillation: Secondary | ICD-10-CM

## 2015-01-10 DIAGNOSIS — Z950 Presence of cardiac pacemaker: Secondary | ICD-10-CM

## 2015-01-10 DIAGNOSIS — I2510 Atherosclerotic heart disease of native coronary artery without angina pectoris: Secondary | ICD-10-CM

## 2015-01-10 LAB — POCT INR: INR: 1

## 2015-01-10 NOTE — Patient Instructions (Signed)

## 2015-01-10 NOTE — Progress Notes (Signed)
Cardiology Office Note   Date:  01/10/2015   ID:  PERFECTO PURDY, DOB 27-Nov-1922, MRN [id number]  PCP:  Kandice Hams, MD  Cardiologist:  Sinclair Grooms, MD   Chief Complaint  Patient presents with  . Coronary Artery Disease      History of Present Illness: [name] is a 81 y.o. male who presents for CAD, CABG, tachybradycardia syndrome, permanent pacemaker, paroxysmal atrial fibrillation, and chronic kidney disease stage IV, and systolic heart failure/chronic.  [name] is accompanied by his wife [name]. He has no complaints. He seems disoriented. He has a difficult time hearing. He denies chest pain. I tried explaining the need for Coumadin therapy. He denies seeing to comprehend the discussion.    Past Medical History  Diagnosis Date  . Hypertension   . Diabetes mellitus   . Pacemaker   . Left leg DVT   . CKD (chronic kidney disease)   . Depression   . HTN (hypertension)   . Gait abnormality   . CAD (coronary artery disease)     with prior CABG 1991 and 2006 LIMA LAD, seq. SVG D1-Cfx, SVG d2,and seq. SVG AM-PDA Cardiology Dr. Tamala Julian    Past Surgical History  Procedure Laterality Date  . Prostatectomy    . Coronary artery bypass graft       Current Outpatient Prescriptions  Medication Sig Dispense Refill  . amLODipine (NORVASC) 10 MG tablet Take 10 mg by mouth daily.  4  . aspirin 81 MG tablet Take 81 mg by mouth daily.    Marland Kitchen atenolol (TENORMIN) 50 MG tablet Take 50 mg by mouth 2 (two) times daily.     Marland Kitchen buPROPion (WELLBUTRIN) 100 MG tablet Take 1 tablet by mouth daily.  2  . calcium-vitamin D (OSCAL WITH D) 500-200 MG-UNIT per tablet Take 1 tablet by mouth 2 (two) times daily.    . Glucose Blood (FREESTYLE LITE TEST VI) USE AS DIRECTED    . hydrALAZINE (APRESOLINE) 50 MG tablet Take 1 tablet by mouth 3 (three) times daily. HOLD FOR SBP <120  2  . Insulin Detemir (LEVEMIR FLEXPEN) 100 UNIT/ML Pen Inject 16 Units into the skin every morning.    .  linagliptin (TRADJENTA) 5 MG TABS tablet Take 5 mg by mouth every morning.    . Melatonin 3 MG CAPS Take 3 mg by mouth at bedtime.    . memantine (NAMENDA) 5 MG tablet Take 1 tablet by mouth 2 (two) times daily.  2  . mirtazapine (REMERON) 15 MG tablet Take 1 tablet by mouth at bedtime.  3  . Multiple Vitamins-Minerals (MULTIVITAMIN PO) Take 1 tablet by mouth daily.    Marland Kitchen olmesartan (BENICAR) 40 MG tablet Take 20 mg by mouth daily.    . polyethylene glycol (MIRALAX / GLYCOLAX) packet Take 17 g by mouth daily.    . SENNA PO Take 1 tablet by mouth daily.    . sertraline (ZOLOFT) 50 MG tablet Take by mouth at bedtime. TAKE 3 TABLETS    . warfarin (COUMADIN) 2 MG tablet Take 1 tablet (2 mg total) by mouth daily at 6 PM. 90 tablet 3   No current facility-administered medications for this visit.    Allergies:   Iodinated diagnostic agents; Iohexol; Ace inhibitors; and Simvastatin    Social History:  The patient  reports that he has never smoked. He has never used smokeless tobacco. He reports that he does not drink alcohol or use illicit drugs.   Family  History:  The patient's family history includes Diabetes in his mother; Heart disease in his sister, sister, sister, and sister.    ROS:  Please see the history of present illness.   Otherwise, review of systems are positive for none.   All other systems are reviewed and negative.    PHYSICAL EXAM: VS:  BP 144/78 mmHg  Pulse 73  Ht 5\' 10"  (1.778 m)  Wt 68.675 kg (151 lb 6.4 oz)  BMI 21.72 kg/m2 , BMI Body mass index is 21.72 kg/(m^2). GEN: Well nourished, well developed, in no acute distress HEENT: normal Neck: no JVD, carotid bruits, or masses Cardiac: RRR; no murmurs, rubs, or gallops,no edema  Respiratory:  clear to auscultation bilaterally, normal work of breathing GI: soft, nontender, nondistended, + BS MS: no deformity or atrophy Skin: warm and dry, no rash Neuro:  Strength and sensation are intact Psych: euthymic mood, full  affect   EKG:  EKG is ordered today. The ekg ordered today demonstrates AV sequential pacing   Recent Labs: No results found for requested labs within last 365 days.    Lipid Panel No results found for: CHOL, TRIG, HDL, CHOLHDL, VLDL, LDLCALC, LDLDIRECT    Wt Readings from Last 3 Encounters:  01/10/15 68.675 kg (151 lb 6.4 oz)  12/26/14 68.312 kg (150 lb 9.6 oz)  06/06/14 72.122 kg (159 lb)      Other studies Reviewed: Additional studies/ records that were reviewed today include: .    ASSESSMENT AND PLAN:  Paroxysmal atrial fibrillation - discovered by telemetry of pacemaker device.  Coronary artery disease without obvious angina  Essential hypertension - controlled  Pacemaker  CKD (chronic kidney disease), stage 3 (moderate) -his primary care physician recently resumed Benicar. I've expressed my concern given his renal insufficiency. We'll get a beam at checked 5-7 days after the therapy is begun. He started the therapy 2 days ago.  Chronic anticoagulation - Coumadin therapy was started after discussion with the patient and family. He was seen in Coumadin clinic today.     Current medicines are reviewed at length with the patient today.  The patient does not have concerns regarding medicines.  The following changes have been made:  no change  Labs/ tests ordered today include: Basic metabolic panel in 5 days  Orders Placed This Encounter  Procedures  . EKG 12-Lead     Disposition:   FU with HS in PRN    Signed, Sinclair Grooms, MD  01/10/2015 6:08 PM    [facility] [place], [place], [place]  [address] Phone: [phone]; Fax: [phone]

## 2015-01-10 NOTE — Patient Instructions (Signed)
Medication Instructions:  Your physician recommends that you continue on your current medications as directed. Please refer to the Current Medication list given to you today.   Labwork: Your physician recommends that you have  lab work in: 1 week  Bmet ( By your [facility] nurse)   Testing/Procedures: None   Follow-Up: Your physician recommends that you schedule a follow-up appointment as needed   Any Other Special Instructions Will Be Listed Below (If Applicable).

## 2015-01-10 NOTE — Telephone Encounter (Signed)
Verbal order given to [facility] Nurse mgr Zigmund Daniel to draw a bmet when home health nurse is out to the pt home on 6/16. Results should be faxed to our office attn: Dr.Smith

## 2015-01-17 ENCOUNTER — Ambulatory Visit (INDEPENDENT_AMBULATORY_CARE_PROVIDER_SITE_OTHER): Payer: BLUE CROSS/BLUE SHIELD | Admitting: Cardiology

## 2015-01-17 DIAGNOSIS — Z5181 Encounter for therapeutic drug level monitoring: Secondary | ICD-10-CM

## 2015-01-17 DIAGNOSIS — I480 Paroxysmal atrial fibrillation: Secondary | ICD-10-CM

## 2015-01-17 LAB — POCT INR: INR: 1

## 2015-01-22 ENCOUNTER — Ambulatory Visit (INDEPENDENT_AMBULATORY_CARE_PROVIDER_SITE_OTHER): Payer: BLUE CROSS/BLUE SHIELD | Admitting: Interventional Cardiology

## 2015-01-22 DIAGNOSIS — I480 Paroxysmal atrial fibrillation: Secondary | ICD-10-CM

## 2015-01-22 DIAGNOSIS — Z5181 Encounter for therapeutic drug level monitoring: Secondary | ICD-10-CM

## 2015-01-22 LAB — POCT INR: INR: 2.3

## 2015-01-29 ENCOUNTER — Ambulatory Visit (INDEPENDENT_AMBULATORY_CARE_PROVIDER_SITE_OTHER): Payer: BLUE CROSS/BLUE SHIELD | Admitting: Cardiology

## 2015-01-29 DIAGNOSIS — I480 Paroxysmal atrial fibrillation: Secondary | ICD-10-CM

## 2015-01-29 DIAGNOSIS — Z5181 Encounter for therapeutic drug level monitoring: Secondary | ICD-10-CM

## 2015-01-29 LAB — POCT INR: INR: 3.2

## 2015-02-05 ENCOUNTER — Ambulatory Visit (INDEPENDENT_AMBULATORY_CARE_PROVIDER_SITE_OTHER): Payer: BLUE CROSS/BLUE SHIELD | Admitting: Internal Medicine

## 2015-02-05 DIAGNOSIS — I480 Paroxysmal atrial fibrillation: Secondary | ICD-10-CM

## 2015-02-05 DIAGNOSIS — Z5181 Encounter for therapeutic drug level monitoring: Secondary | ICD-10-CM

## 2015-02-05 LAB — POCT INR: INR: 3.2

## 2015-02-06 ENCOUNTER — Encounter: Payer: Self-pay | Admitting: Interventional Cardiology

## 2015-02-11 ENCOUNTER — Other Ambulatory Visit: Payer: Self-pay | Admitting: *Deleted

## 2015-02-11 MED ORDER — WARFARIN SODIUM 4 MG PO TABS: 4.0000 mg | ORAL_TABLET | ORAL | Status: DC

## 2015-02-14 ENCOUNTER — Ambulatory Visit (INDEPENDENT_AMBULATORY_CARE_PROVIDER_SITE_OTHER): Payer: BLUE CROSS/BLUE SHIELD | Admitting: Pharmacist

## 2015-02-14 DIAGNOSIS — Z5181 Encounter for therapeutic drug level monitoring: Secondary | ICD-10-CM

## 2015-02-14 DIAGNOSIS — I480 Paroxysmal atrial fibrillation: Secondary | ICD-10-CM

## 2015-02-14 LAB — POCT INR: INR: 3.3

## 2015-02-20 ENCOUNTER — Ambulatory Visit: Payer: BLUE CROSS/BLUE SHIELD | Admitting: Pharmacist

## 2015-02-20 DIAGNOSIS — I480 Paroxysmal atrial fibrillation: Secondary | ICD-10-CM

## 2015-02-20 DIAGNOSIS — Z5181 Encounter for therapeutic drug level monitoring: Secondary | ICD-10-CM

## 2015-02-20 LAB — POCT INR: INR: 2.7

## 2015-02-21 ENCOUNTER — Ambulatory Visit (INDEPENDENT_AMBULATORY_CARE_PROVIDER_SITE_OTHER): Payer: BLUE CROSS/BLUE SHIELD | Admitting: Pharmacist

## 2015-02-21 DIAGNOSIS — Z5181 Encounter for therapeutic drug level monitoring: Secondary | ICD-10-CM

## 2015-02-21 DIAGNOSIS — I480 Paroxysmal atrial fibrillation: Secondary | ICD-10-CM

## 2015-02-21 LAB — POCT INR: INR: 2.7

## 2015-02-26 ENCOUNTER — Telehealth: Payer: Self-pay | Admitting: Interventional Cardiology

## 2015-02-26 NOTE — Telephone Encounter (Signed)
Follow up:  Amy with [facility] would like a faxed order for intrum  On this pt please fax #  [phone]

## 2015-03-06 ENCOUNTER — Ambulatory Visit (INDEPENDENT_AMBULATORY_CARE_PROVIDER_SITE_OTHER): Payer: Medicare Other | Admitting: Internal Medicine

## 2015-03-06 DIAGNOSIS — I480 Paroxysmal atrial fibrillation: Secondary | ICD-10-CM

## 2015-03-06 DIAGNOSIS — Z5181 Encounter for therapeutic drug level monitoring: Secondary | ICD-10-CM

## 2015-03-06 LAB — POCT INR: INR: 3.6

## 2015-03-06 NOTE — Addendum Note (Signed)
Addended by: SUPPLE, MEGAN E on: 03/06/2015 02:20 PM   Modules accepted: Level of Service, SmartSet

## 2015-03-06 NOTE — Progress Notes (Signed)
This encounter was created in error - please disregard.

## 2015-03-06 NOTE — Addendum Note (Signed)
Addended by: Zenovia Jarred on: 03/06/2015 02:17 PM   Modules accepted: Level of Service, SmartSet

## 2015-03-20 ENCOUNTER — Ambulatory Visit (INDEPENDENT_AMBULATORY_CARE_PROVIDER_SITE_OTHER): Payer: Medicare Other | Admitting: *Deleted

## 2015-03-20 DIAGNOSIS — I480 Paroxysmal atrial fibrillation: Secondary | ICD-10-CM

## 2015-03-20 DIAGNOSIS — Z5181 Encounter for therapeutic drug level monitoring: Secondary | ICD-10-CM

## 2015-03-20 LAB — POCT INR: INR: 2.3

## 2015-03-27 ENCOUNTER — Telehealth: Payer: Self-pay | Admitting: Cardiology

## 2015-03-27 ENCOUNTER — Ambulatory Visit (INDEPENDENT_AMBULATORY_CARE_PROVIDER_SITE_OTHER): Payer: Medicare Other | Admitting: *Deleted

## 2015-03-27 DIAGNOSIS — I495 Sick sinus syndrome: Secondary | ICD-10-CM | POA: Diagnosis not present

## 2015-03-27 NOTE — Progress Notes (Signed)
Remote pacemaker transmission.   

## 2015-03-27 NOTE — Telephone Encounter (Signed)
Confirmed remote transmission w/ pt wife.   

## 2015-03-31 LAB — CUP PACEART REMOTE DEVICE CHECK
Brady Statistic AP VS Percent: 0 %
Brady Statistic AS VP Percent: 4 %
Brady Statistic AS VS Percent: 0 %
Lead Channel Impedance Value: 413 Ohm
Lead Channel Pacing Threshold Amplitude: 1 V
Lead Channel Pacing Threshold Amplitude: 1.625 V
Lead Channel Pacing Threshold Pulse Width: 0.4 ms
Lead Channel Pacing Threshold Pulse Width: 0.4 ms
Lead Channel Setting Sensing Sensitivity: 2.8 mV
MDC IDC MSMT BATTERY IMPEDANCE: 430 Ohm
MDC IDC MSMT BATTERY REMAINING LONGEVITY: 71 mo
MDC IDC MSMT BATTERY VOLTAGE: 2.79 V
MDC IDC MSMT LEADCHNL RA IMPEDANCE VALUE: 464 Ohm
MDC IDC SESS DTM: 20160825145330
MDC IDC SET LEADCHNL RA PACING AMPLITUDE: 3.25 V
MDC IDC SET LEADCHNL RV PACING AMPLITUDE: 2 V
MDC IDC SET LEADCHNL RV PACING PULSEWIDTH: 0.4 ms
MDC IDC STAT BRADY AP VP PERCENT: 96 %

## 2015-04-03 ENCOUNTER — Encounter: Payer: Self-pay | Admitting: Cardiology

## 2015-04-10 ENCOUNTER — Encounter: Payer: Self-pay | Admitting: Pharmacist

## 2015-04-10 LAB — PROTIME-INR: INR: 3.7 — AB (ref 0.9–1.1)

## 2015-04-14 ENCOUNTER — Encounter: Payer: Self-pay | Admitting: Internal Medicine

## 2015-04-16 ENCOUNTER — Telehealth: Payer: Self-pay | Admitting: *Deleted

## 2015-04-16 ENCOUNTER — Ambulatory Visit (INDEPENDENT_AMBULATORY_CARE_PROVIDER_SITE_OTHER): Payer: Medicare Other | Admitting: *Deleted

## 2015-04-16 DIAGNOSIS — Z5181 Encounter for therapeutic drug level monitoring: Secondary | ICD-10-CM | POA: Diagnosis not present

## 2015-04-16 DIAGNOSIS — I480 Paroxysmal atrial fibrillation: Secondary | ICD-10-CM | POA: Diagnosis not present

## 2015-04-16 LAB — POCT INR: INR: 2.8

## 2015-04-16 NOTE — Telephone Encounter (Signed)
Call received from Pt's daughter Dr  Andrez Grime that he has been discharged from [place] and did have INR done last week by [facility] and the results was 3.67 Informed daughter that we did not receive these results and she talked at length  regarding getting agency in the home to do INR and send results to [place] so until these arrangements can be done made an appt for him to be checked in the coumadin clinic today. The daughter did state that she instructed her Father to take only 4mg  of coumadin last night

## 2015-04-23 ENCOUNTER — Telehealth: Payer: Self-pay | Admitting: Interventional Cardiology

## 2015-04-23 NOTE — Telephone Encounter (Signed)
New Message  Pt c/o BP issue:  1. What are your last 5 BP readings? No BP documented 2. Are you having any other symptoms (ex. Dizziness, headache, blurred vision, passed out)? Heart starts beating much faster than usual. BP is much lower than normal. None of the symptoms Dizziness, headache, blurred vision or passing out   3. What is your medication issue? Not sure if it is a med issue. Would like a call back to discuss because this seems to be a problem that he has never had before.

## 2015-04-23 NOTE — Telephone Encounter (Signed)
I spoke with the pt's wife and she states that the pt has complained of episodes of increased heart rate for the past 2 weeks.  This mainly occurs at night but not every night. She said the pt's BP has been running 114/49 and 120/60 .  These readings are lower that what the pt has seen in the past.  The pt's wife then asked me to speak with the pt's daughter.   Per the pt's daughter the pt's BP has been running 20 points below normal and pulse is 20 points above normal.  They have not noticed any evidence of blood loss in this patient and he has not had changes in his weight. I advised her that we can have the pt do a device transmission from home for review. The pt is in the process of sending transmission and the pt's daughter would like a return phone call shortly.

## 2015-04-23 NOTE — Telephone Encounter (Signed)
I spoke with Juanda Crumble in device and he will follow-up with the pt's family after receiving device transmission.

## 2015-04-24 ENCOUNTER — Ambulatory Visit (INDEPENDENT_AMBULATORY_CARE_PROVIDER_SITE_OTHER): Payer: Medicare Other | Admitting: Pharmacist

## 2015-04-24 DIAGNOSIS — I480 Paroxysmal atrial fibrillation: Secondary | ICD-10-CM

## 2015-04-24 DIAGNOSIS — Z5181 Encounter for therapeutic drug level monitoring: Secondary | ICD-10-CM

## 2015-04-24 LAB — PROTIME-INR: INR: 2.1 — AB (ref 0.9–1.1)

## 2015-04-24 NOTE — Telephone Encounter (Signed)
Spoke w/ pt's daughter, [name]. Updated her we have not received a transmission. [name] very concerned she has not had a call returned. She is concerned her father's BP has decreased 20 points below average. Concerned his heart rate has increased 20bpm above avg. I suggested having her bring pt to device clinic w/ potential review by in office EP or DOD. She was very reluctant to have a rotational (DOD) doctor review.   I stayed on phone for a manual transmission. Remote was received. All diagnostics normal. From a pacemaker standpoint, I updated her I would not recommend an appointment from w/ an EP.   Dr. Tamala Julian is not back in clinic until 04/30/15. After calling family back, I spoke to pt's spouse. I offered her an add-on appt for 04/30/15 at 12:00 per Lattie Haw. They accepted that time slot. They still prefer to see Dr. Tamala Julian instead of a DOD.  Appointment on 04/30/15 at 12:00 confirmed w/ Lattie Haw.

## 2015-04-24 NOTE — Telephone Encounter (Signed)
[phone] wife Lorna Dibble calling back for update on device transmission-said she hasn't received call back

## 2015-04-28 NOTE — Progress Notes (Signed)
Cardiology Office Note   Date:  04/30/2015   ID:  [name], DOB [birth year], MRN [id number]  PCP:  [name], MD  Cardiologist:  Sinclair Grooms, MD   Chief Complaint  Patient presents with  . Coronary Artery Disease  . Atrial Fibrillation      History of Present Illness: [name] is a 81 y.o. male who presents for hypertension, coronary artery disease, chronic kidney disease stage IV, tachybradycardia syndrome, paroxysmal atrial fibrillation on chronic anticoagulation therapy, and permanent pacemaker therapy. He has significant dementia.  [name] is brought in today by his wife and daughter. They are concerned about blood pressure fluctuations. They get concerned when he had systolic blood pressures less than 115 mmHg. They want to know which medications should be given and which medications could be held safely. He is on a very aggressive blood pressure regimen with hydralazine added to 4 other agents earlier this year during a hospitalization for confusion and infection. He was volume overloaded at the time that his medication regimen was altered.   Past Medical History  Diagnosis Date  . Hypertension   . Diabetes mellitus   . Pacemaker   . Left leg DVT   . CKD (chronic kidney disease)   . Depression   . HTN (hypertension)   . Gait abnormality   . CAD (coronary artery disease)     with prior CABG 1991 and 2006 LIMA LAD, seq. SVG D1-Cfx, SVG d2,and seq. SVG AM-PDA Cardiology Dr. Tamala Julian    Past Surgical History  Procedure Laterality Date  . Prostatectomy    . Coronary artery bypass graft       Current Outpatient Prescriptions  Medication Sig Dispense Refill  . amLODipine (NORVASC) 10 MG tablet Take 10 mg by mouth daily.  4  . atenolol (TENORMIN) 50 MG tablet Take 50 mg by mouth 2 (two) times daily.     Marland Kitchen buPROPion (WELLBUTRIN) 100 MG tablet Take 1 tablet by mouth daily.  2  . CALCIUM PO Take 1 tablet by mouth daily.    . Coenzyme Q10  (COQ10 PO) Take 1 capsule by mouth daily.    . furosemide (LASIX) 20 MG tablet Take 10 mg by mouth daily.  5  . Glucose Blood (FREESTYLE LITE TEST VI) USE AS DIRECTED    . Insulin Detemir (LEVEMIR FLEXPEN) 100 UNIT/ML Pen Inject 16 Units into the skin every morning.    . linagliptin (TRADJENTA) 5 MG TABS tablet Take 7.5 mg by mouth daily.    . mirtazapine (REMERON) 15 MG tablet Take 1 tablet by mouth at bedtime.  3  . Multiple Vitamins-Minerals (ICAPS PO) Take 1 tablet by mouth 2 (two) times daily.    Marland Kitchen olmesartan (BENICAR) 40 MG tablet Take 40 mg by mouth daily.     . polyethylene glycol (MIRALAX / GLYCOLAX) packet Take 17 g by mouth daily.    . Probiotic Product (ALIGN PO) Take 1 capsule by mouth daily.    . SENNA PO Take 1 tablet by mouth daily.    . sertraline (ZOLOFT) 50 MG tablet Take by mouth at bedtime. TAKE 3 TABLETS    . warfarin (COUMADIN) 4 MG tablet Take 1 tablet (4 mg total) by mouth as directed. 50 tablet 3   No current facility-administered medications for this visit.    Allergies:   Iodinated diagnostic agents; Iohexol; Ace inhibitors; Simvastatin; and Aspirin    Social History:  The patient  reports that he has  never smoked. He has never used smokeless tobacco. He reports that he does not drink alcohol or use illicit drugs.   Family History:  The patient's family history includes Diabetes in his mother; Heart disease in his sister, sister, sister, and sister.    ROS:  Please see the history of present illness.   Otherwise, review of systems are positive for anxiety about palpitations, progressive dementia, occasional agitation, palpitations, and poor appetite.   All other systems are reviewed and negative.    PHYSICAL EXAM: VS:  BP 144/82 mmHg  Pulse 64  Ht 5\' 10"  (1.778 m)  Wt 66.28 kg (146 lb 1.9 oz)  BMI 20.97 kg/m2 , BMI Body mass index is 20.97 kg/(m^2). GEN: Well nourished, well developed, in no acute distress HEENT: normal Neck: no JVD, carotid bruits, or  masses Cardiac: RRR.  There is no murmur, rub, or gallop. There is no edema. Respiratory:  clear to auscultation bilaterally, normal work of breathing. GI: soft, nontender, nondistended, + BS MS: no deformity or atrophy Skin: warm and dry, no rash Neuro:  Strength and sensation are intact Psych: euthymic mood, full affect   EKG:  EKG is ordered today. The ekg reveals AV sequential pacing.   Recent Labs: See below    Lipid Panel No results found for: CHOL, TRIG, HDL, CHOLHDL, VLDL, LDLCALC, LDLDIRECT    Wt Readings from Last 3 Encounters:  04/30/15 66.28 kg (146 lb 1.9 oz)  01/10/15 68.675 kg (151 lb 6.4 oz)  12/26/14 68.312 kg (150 lb 9.6 oz)      Other studies Reviewed: Additional studies/ records that were reviewed today include: Laboratory data from outside. The findings include creatinine 2.7 BUN 48 potassium 4.3 INR 2.14.    ASSESSMENT AND PLAN:  1. Coronary artery disease involving native coronary artery of native heart without angina pectoris No specific complaints of angina  2. Essential hypertension Labile pressures which I believe are primarily due to hydralazine.  3. Paroxysmal atrial fibrillation Reviewing the most recent pacemaker interrogation, he is having episodes of mode switching compatible with atrial fibrillation.  4. Stage IV chronic kidney disease We will need to watch function very closely. Olmesartan May eventually need to be discontinued.  5. Encounter for therapeutic drug monitoring Continues on Coumadin therapy and monitored throughout Coumadin clinic.    Current medicines are reviewed at length with the patient today.  The patient has the following concerns regarding medicines: The concern is how to manage low blood pressures. We discussed the rebound potential of sudden beta blocker discontinuation. Any of the other medications can be held if his systolic blood pressures less than 115 mmHg..  The following changes/actions have been  instituted:    Discontinue hydralazine  Clinical observation and call if concerns. Blood pressures consistently greater than 165/90 mmHg will lead [place] to reinstitute perhaps a lower dose of hydralazine.  Labs/ tests ordered today include:   Orders Placed This Encounter  Procedures  . EKG 12-Lead     Disposition:   FU with HS in when necessary   Signed, Sinclair Grooms, MD  04/30/2015 1:20 PM    [facility] [place], [place], [place]  [address] Phone: [phone]; Fax: [phone]

## 2015-04-30 ENCOUNTER — Ambulatory Visit (INDEPENDENT_AMBULATORY_CARE_PROVIDER_SITE_OTHER): Payer: Medicare Other | Admitting: Interventional Cardiology

## 2015-04-30 ENCOUNTER — Ambulatory Visit (INDEPENDENT_AMBULATORY_CARE_PROVIDER_SITE_OTHER): Payer: Medicare Other | Admitting: *Deleted

## 2015-04-30 ENCOUNTER — Encounter: Payer: Self-pay | Admitting: Interventional Cardiology

## 2015-04-30 ENCOUNTER — Telehealth: Payer: Self-pay | Admitting: Interventional Cardiology

## 2015-04-30 VITALS — BP 144/82 | HR 64 | Ht 70.0 in | Wt 146.1 lb

## 2015-04-30 DIAGNOSIS — N183 Chronic kidney disease, stage 3 (moderate): Secondary | ICD-10-CM

## 2015-04-30 DIAGNOSIS — I10 Essential (primary) hypertension: Secondary | ICD-10-CM

## 2015-04-30 DIAGNOSIS — I480 Paroxysmal atrial fibrillation: Secondary | ICD-10-CM

## 2015-04-30 DIAGNOSIS — Z5181 Encounter for therapeutic drug level monitoring: Secondary | ICD-10-CM

## 2015-04-30 DIAGNOSIS — I2510 Atherosclerotic heart disease of native coronary artery without angina pectoris: Secondary | ICD-10-CM

## 2015-04-30 LAB — POCT INR: INR: 2.3

## 2015-04-30 NOTE — Telephone Encounter (Signed)
NewMessage  Amy from [facility], calling about outstanding orders. Please call back and discuss.

## 2015-04-30 NOTE — Patient Instructions (Signed)
Medication Instructions:  STOP Hydralazine  Labwork: None ordered  Testing/Procedures: None ordered  Follow-Up: Your physician recommends that you schedule a follow-up appointment as needed    Any Other Special Instructions Will Be Listed Below (If Applicable).

## 2015-05-07 NOTE — Telephone Encounter (Signed)
Orders faxed to [facility] 

## 2015-05-13 ENCOUNTER — Telehealth: Payer: Self-pay | Admitting: Interventional Cardiology

## 2015-05-13 NOTE — Telephone Encounter (Signed)
New message      Pt is out of town and is due to have a pt/inr drawn.  Please fax an order to [phone] [facility] to have them draw the pt tomorrow.  Then please call pt at [phone] or [phone] to give dosage instructions

## 2015-05-13 NOTE — Telephone Encounter (Signed)
Spoke with daughter Gustaf Mccarter (daughter) in reference to having the patient's INR checked.  Tanise stated that the patient is out of town and will be due for an INR check on 05/14/15.  She states that they have a service that will be able to obtain the INR on 05/14/15 and she provided the information for [facility] in [place].  The fax# [phone] and office # [phone], they are located at [place] in [place], [place]. The order was faxed to [facility] and I included our main # & fax #.  Called to ensure the had received the order and had to leave a message.  Advised to call with any other issues and questions and she verbalized understanding.  The results will need to called to [facility] cell phone or Demar Shad at [phone]

## 2015-05-14 ENCOUNTER — Ambulatory Visit (INDEPENDENT_AMBULATORY_CARE_PROVIDER_SITE_OTHER): Payer: Medicare Other | Admitting: Internal Medicine

## 2015-05-14 DIAGNOSIS — I480 Paroxysmal atrial fibrillation: Secondary | ICD-10-CM

## 2015-05-14 DIAGNOSIS — Z5181 Encounter for therapeutic drug level monitoring: Secondary | ICD-10-CM

## 2015-05-14 LAB — POCT INR: INR: 2.6

## 2015-05-19 ENCOUNTER — Ambulatory Visit (INDEPENDENT_AMBULATORY_CARE_PROVIDER_SITE_OTHER): Payer: Medicare Other | Admitting: Pharmacist

## 2015-05-19 DIAGNOSIS — Z5181 Encounter for therapeutic drug level monitoring: Secondary | ICD-10-CM

## 2015-05-19 DIAGNOSIS — I480 Paroxysmal atrial fibrillation: Secondary | ICD-10-CM

## 2015-05-19 LAB — POCT INR: INR: 3.3

## 2015-05-28 ENCOUNTER — Ambulatory Visit (INDEPENDENT_AMBULATORY_CARE_PROVIDER_SITE_OTHER): Payer: Medicare Other | Admitting: Pharmacist

## 2015-05-28 DIAGNOSIS — Z5181 Encounter for therapeutic drug level monitoring: Secondary | ICD-10-CM

## 2015-05-28 DIAGNOSIS — I480 Paroxysmal atrial fibrillation: Secondary | ICD-10-CM

## 2015-05-28 LAB — PROTIME-INR
INR: 3.3 — AB (ref <=1.1)
INR: 3.2 — AB (ref <=1.1)

## 2015-06-02 LAB — PROTIME-INR: INR: 2.9 — AB (ref 0.9–1.1)

## 2015-06-09 LAB — PROTIME-INR: INR: 2.9 — AB (ref <=1.1)

## 2015-06-10 ENCOUNTER — Ambulatory Visit (INDEPENDENT_AMBULATORY_CARE_PROVIDER_SITE_OTHER): Payer: Medicare Other | Admitting: Cardiovascular Disease

## 2015-06-10 DIAGNOSIS — I480 Paroxysmal atrial fibrillation: Secondary | ICD-10-CM

## 2015-06-10 DIAGNOSIS — Z5181 Encounter for therapeutic drug level monitoring: Secondary | ICD-10-CM

## 2015-06-20 LAB — PROTIME-INR: INR: 2.8 — AB (ref 0.9–1.1)

## 2015-06-23 ENCOUNTER — Other Ambulatory Visit: Payer: Self-pay | Admitting: Interventional Cardiology

## 2015-06-30 ENCOUNTER — Telehealth: Payer: Self-pay | Admitting: Cardiology

## 2015-06-30 ENCOUNTER — Encounter: Payer: Medicare Other | Admitting: *Deleted

## 2015-06-30 NOTE — Telephone Encounter (Signed)
LMOVM reminding pt to send remote transmission.   

## 2015-07-01 ENCOUNTER — Encounter: Payer: Self-pay | Admitting: Cardiology

## 2015-07-03 ENCOUNTER — Encounter: Payer: Self-pay | Admitting: Cardiovascular Disease

## 2015-07-05 ENCOUNTER — Encounter (HOSPITAL_COMMUNITY): Payer: Self-pay | Admitting: Emergency Medicine

## 2015-07-05 ENCOUNTER — Observation Stay (HOSPITAL_COMMUNITY)
Admission: EM | Admit: 2015-07-05 | Discharge: 2015-07-07 | Disposition: A | Discharge status: Home / Self Care | Payer: Medicare Other | Attending: Internal Medicine | Admitting: Internal Medicine

## 2015-07-05 ENCOUNTER — Emergency Department (HOSPITAL_COMMUNITY): Payer: Medicare Other

## 2015-07-05 DIAGNOSIS — I2510 Atherosclerotic heart disease of native coronary artery without angina pectoris: Secondary | ICD-10-CM | POA: Insufficient documentation

## 2015-07-05 DIAGNOSIS — Z79899 Other long term (current) drug therapy: Secondary | ICD-10-CM | POA: Insufficient documentation

## 2015-07-05 DIAGNOSIS — E860 Dehydration: Secondary | ICD-10-CM

## 2015-07-05 DIAGNOSIS — I10 Essential (primary) hypertension: Secondary | ICD-10-CM | POA: Insufficient documentation

## 2015-07-05 DIAGNOSIS — N184 Chronic kidney disease, stage 4 (severe): Secondary | ICD-10-CM | POA: Insufficient documentation

## 2015-07-05 DIAGNOSIS — I4891 Unspecified atrial fibrillation: Secondary | ICD-10-CM | POA: Insufficient documentation

## 2015-07-05 DIAGNOSIS — N189 Chronic kidney disease, unspecified: Secondary | ICD-10-CM

## 2015-07-05 DIAGNOSIS — Z794 Long term (current) use of insulin: Secondary | ICD-10-CM | POA: Insufficient documentation

## 2015-07-05 DIAGNOSIS — N179 Acute kidney failure, unspecified: Principal | ICD-10-CM | POA: Insufficient documentation

## 2015-07-05 DIAGNOSIS — R1110 Vomiting, unspecified: Secondary | ICD-10-CM

## 2015-07-05 DIAGNOSIS — E1122 Type 2 diabetes mellitus with diabetic chronic kidney disease: Secondary | ICD-10-CM | POA: Diagnosis not present

## 2015-07-05 DIAGNOSIS — Z8546 Personal history of malignant neoplasm of prostate: Secondary | ICD-10-CM | POA: Diagnosis not present

## 2015-07-05 DIAGNOSIS — Z951 Presence of aortocoronary bypass graft: Secondary | ICD-10-CM | POA: Insufficient documentation

## 2015-07-05 DIAGNOSIS — Z7901 Long term (current) use of anticoagulants: Secondary | ICD-10-CM | POA: Diagnosis not present

## 2015-07-05 DIAGNOSIS — K5909 Other constipation: Secondary | ICD-10-CM | POA: Diagnosis not present

## 2015-07-05 DIAGNOSIS — Z950 Presence of cardiac pacemaker: Secondary | ICD-10-CM | POA: Diagnosis present

## 2015-07-05 DIAGNOSIS — I5020 Unspecified systolic (congestive) heart failure: Secondary | ICD-10-CM

## 2015-07-05 DIAGNOSIS — E785 Hyperlipidemia, unspecified: Secondary | ICD-10-CM | POA: Diagnosis not present

## 2015-07-05 DIAGNOSIS — Z86718 Personal history of other venous thrombosis and embolism: Secondary | ICD-10-CM | POA: Diagnosis not present

## 2015-07-05 DIAGNOSIS — R634 Abnormal weight loss: Secondary | ICD-10-CM

## 2015-07-05 DIAGNOSIS — F0390 Unspecified dementia without behavioral disturbance: Secondary | ICD-10-CM | POA: Diagnosis present

## 2015-07-05 DIAGNOSIS — F329 Major depressive disorder, single episode, unspecified: Secondary | ICD-10-CM | POA: Insufficient documentation

## 2015-07-05 DIAGNOSIS — Z9079 Acquired absence of other genital organ(s): Secondary | ICD-10-CM | POA: Insufficient documentation

## 2015-07-05 DIAGNOSIS — I129 Hypertensive chronic kidney disease with stage 1 through stage 4 chronic kidney disease, or unspecified chronic kidney disease: Secondary | ICD-10-CM | POA: Insufficient documentation

## 2015-07-05 DIAGNOSIS — R748 Abnormal levels of other serum enzymes: Secondary | ICD-10-CM

## 2015-07-05 DIAGNOSIS — R112 Nausea with vomiting, unspecified: Secondary | ICD-10-CM

## 2015-07-05 HISTORY — DX: Unspecified dementia, unspecified severity, without behavioral disturbance, psychotic disturbance, mood disturbance, and anxiety: F03.90

## 2015-07-05 LAB — URINALYSIS, ROUTINE W REFLEX MICROSCOPIC
BILIRUBIN URINE: NEGATIVE
GLUCOSE, UA: NEGATIVE mg/dL
HGB URINE DIPSTICK: NEGATIVE
Ketones, ur: 15 mg/dL — AB
Leukocytes, UA: NEGATIVE
Nitrite: NEGATIVE
Protein, ur: 30 mg/dL — AB
SPECIFIC GRAVITY, URINE: 1.011 (ref 1.005–1.030)
pH: 7.5 (ref 5.0–8.0)

## 2015-07-05 LAB — GLUCOSE, CAPILLARY
GLUCOSE-CAPILLARY: 130 mg/dL — AB (ref 65–99)
Glucose-Capillary: 158 mg/dL — ABNORMAL HIGH (ref 65–99)

## 2015-07-05 LAB — CBC WITH DIFFERENTIAL/PLATELET
BASOS PCT: 0 %
Basophils Absolute: 0 10*3/uL (ref 0.0–0.1)
EOS ABS: 0 10*3/uL (ref 0.0–0.7)
EOS PCT: 1 %
HCT: 34.1 % — ABNORMAL LOW (ref 39.0–52.0)
HEMOGLOBIN: 11.7 g/dL — AB (ref 13.0–17.0)
LYMPHS ABS: 0.8 10*3/uL (ref 0.7–4.0)
Lymphocytes Relative: 18 %
MCH: 30.4 pg (ref 26.0–34.0)
MCHC: 34.3 g/dL (ref 30.0–36.0)
MCV: 88.6 fL (ref 78.0–100.0)
Monocytes Absolute: 0.4 10*3/uL (ref 0.1–1.0)
Monocytes Relative: 8 %
NEUTROS PCT: 73 %
Neutro Abs: 3.4 10*3/uL (ref 1.7–7.7)
PLATELETS: 141 10*3/uL — AB (ref 150–400)
RBC: 3.85 MIL/uL — AB (ref 4.22–5.81)
RDW: 13 % (ref 11.5–15.5)
WBC: 4.6 10*3/uL (ref 4.0–10.5)

## 2015-07-05 LAB — COMPREHENSIVE METABOLIC PANEL
ALK PHOS: 55 U/L (ref 38–126)
ALT: 18 U/L (ref 17–63)
AST: 21 U/L (ref 15–41)
Albumin: 3.4 g/dL — ABNORMAL LOW (ref 3.5–5.0)
Anion gap: 9 (ref 5–15)
BILIRUBIN TOTAL: 1.4 mg/dL — AB (ref 0.3–1.2)
BUN: 31 mg/dL — AB (ref 6–20)
CALCIUM: 9.6 mg/dL (ref 8.9–10.3)
CHLORIDE: 103 mmol/L (ref 101–111)
CO2: 27 mmol/L (ref 22–32)
CREATININE: 2.79 mg/dL — AB (ref 0.61–1.24)
GFR, EST AFRICAN AMERICAN: 22 mL/min — AB (ref >60)
GFR, EST NON AFRICAN AMERICAN: 19 mL/min — AB (ref >60)
Glucose, Bld: 166 mg/dL — ABNORMAL HIGH (ref 65–99)
Potassium: 3.9 mmol/L (ref 3.5–5.1)
Sodium: 139 mmol/L (ref 135–145)
Total Protein: 6.8 g/dL (ref 6.5–8.1)

## 2015-07-05 LAB — PROTIME-INR
INR: 2 — AB (ref 0.00–1.49)
INR: 2.31 — ABNORMAL HIGH (ref 0.00–1.49)
PROTHROMBIN TIME: 25.2 s — AB (ref 11.6–15.2)
Prothrombin Time: 22.5 seconds — ABNORMAL HIGH (ref 11.6–15.2)

## 2015-07-05 LAB — LIPASE, BLOOD: Lipase: 54 U/L — ABNORMAL HIGH (ref 11–51)

## 2015-07-05 LAB — URINE MICROSCOPIC-ADD ON
Bacteria, UA: NONE SEEN
RBC / HPF: NONE SEEN RBC/hpf (ref 0–5)

## 2015-07-05 LAB — I-STAT TROPONIN, ED: TROPONIN I, POC: 0.02 ng/mL (ref 0.00–0.08)

## 2015-07-05 LAB — AMYLASE: Amylase: 128 U/L — ABNORMAL HIGH (ref 28–100)

## 2015-07-05 MED ORDER — ATENOLOL 50 MG PO TABS
50.0000 mg | ORAL_TABLET | Freq: Two times a day (BID) | ORAL | Status: DC
  Administered 2015-07-05 – 2015-07-07 (×4): 50 mg via ORAL
  Filled 2015-07-05 (×4): qty 1

## 2015-07-05 MED ORDER — ONDANSETRON HCL 4 MG/2ML IJ SOLN: 4.0000 mg | Freq: Once | INTRAMUSCULAR | Status: DC

## 2015-07-05 MED ORDER — SERTRALINE HCL 50 MG PO TABS
150.0000 mg | ORAL_TABLET | Freq: Every day | ORAL | Status: DC
  Administered 2015-07-05 – 2015-07-06 (×2): 150 mg via ORAL
  Filled 2015-07-05 (×2): qty 1

## 2015-07-05 MED ORDER — WARFARIN - PHARMACIST DOSING INPATIENT: Freq: Every day | Status: DC

## 2015-07-05 MED ORDER — SENNA 8.6 MG PO TABS
1.0000 | ORAL_TABLET | Freq: Every day | ORAL | Status: DC
  Administered 2015-07-05 – 2015-07-06 (×2): 8.6 mg via ORAL
  Filled 2015-07-05 (×2): qty 1

## 2015-07-05 MED ORDER — AMLODIPINE BESYLATE 10 MG PO TABS
10.0000 mg | ORAL_TABLET | Freq: Every day | ORAL | Status: DC
  Administered 2015-07-05 – 2015-07-07 (×3): 10 mg via ORAL
  Filled 2015-07-05 (×3): qty 1

## 2015-07-05 MED ORDER — ALUM & MAG HYDROXIDE-SIMETH 200-200-20 MG/5ML PO SUSP: 30.0000 mL | Freq: Four times a day (QID) | ORAL | Status: DC | PRN

## 2015-07-05 MED ORDER — POLYETHYLENE GLYCOL 3350 17 G PO PACK: 17.0000 g | PACK | Freq: Every day | ORAL | Status: DC | PRN

## 2015-07-05 MED ORDER — WARFARIN SODIUM 4 MG PO TABS
4.0000 mg | ORAL_TABLET | Freq: Once | ORAL | Status: AC
  Administered 2015-07-05: 4 mg via ORAL
  Filled 2015-07-05 (×2): qty 1

## 2015-07-05 MED ORDER — ONDANSETRON HCL 4 MG/2ML IJ SOLN: 4.0000 mg | Freq: Four times a day (QID) | INTRAMUSCULAR | Status: DC | PRN

## 2015-07-05 MED ORDER — SODIUM CHLORIDE 0.9 % IV BOLUS (SEPSIS)
500.0000 mL | Freq: Once | INTRAVENOUS | Status: AC
  Administered 2015-07-05: 500 mL via INTRAVENOUS

## 2015-07-05 MED ORDER — ONDANSETRON HCL 4 MG PO TABS
4.0000 mg | ORAL_TABLET | Freq: Four times a day (QID) | ORAL | Status: DC | PRN
  Administered 2015-07-06: 4 mg via ORAL
  Filled 2015-07-05: qty 1

## 2015-07-05 MED ORDER — SODIUM CHLORIDE 0.9 % IV SOLN
INTRAVENOUS | Status: DC
  Administered 2015-07-05: 19:00:00 via INTRAVENOUS

## 2015-07-05 MED ORDER — BUPROPION HCL 100 MG PO TABS
100.0000 mg | ORAL_TABLET | Freq: Every day | ORAL | Status: DC
  Administered 2015-07-06 – 2015-07-07 (×2): 100 mg via ORAL
  Filled 2015-07-05 (×4): qty 1

## 2015-07-05 MED ORDER — INSULIN ASPART 100 UNIT/ML ~~LOC~~ SOLN
0.0000 [IU] | Freq: Three times a day (TID) | SUBCUTANEOUS | Status: DC
  Administered 2015-07-05 – 2015-07-07 (×4): 1 [IU] via SUBCUTANEOUS

## 2015-07-05 NOTE — Progress Notes (Signed)
ANTICOAGULATION CONSULT NOTE - Initial Consult  Pharmacy Consult for warfarin Indication: atrial fibrillation  Allergies  Allergen Reactions  . Iodinated Diagnostic Agents Anaphylaxis  . Iohexol Anaphylaxis     Desc: RN states pt stated he had anyphalactic shock reaction to contrast approx 10 years ago.   . Ace Inhibitors Other (See Comments)    UNKNOWN  . Simvastatin Other (See Comments)    Muscle aches  . Aspirin     Other reaction(s): Abdominal Pain    Patient Measurements: Height: 5\' 10"  (177.8 cm) Weight: 150 lb (68.04 kg) IBW/kg (Calculated) : 73  Vital Signs: Temp: 97.9 F (36.6 C) (12/03 1716) Temp Source: Oral (12/03 1716) BP: 164/83 mmHg (12/03 1716) Pulse Rate: 67 (12/03 1716)  Labs:  Recent Labs  07/05/15 1142 07/05/15 1724  HGB 11.7*  --   HCT 34.1*  --   PLT 141*  --   LABPROT  --  22.5*  INR  --  2.00*  CREATININE 2.79*  --     Estimated Creatinine Clearance: 17.3 mL/min (by C-G formula based on Cr of 2.79).  Assessment: 81 yo male presenting with a 1 day hx of vomiting. 1 month hx of decreased appetite   PMH: HTH, DM2, CKD4, CAD s/p CABG, HFrEF 35-40% s/p ICD, Pulm HTN, AFib, Dementia, prostate ca s/p prostatectomy/radiation  AC: warfarin pta for Afib. Admit INR 2, Last dose 12/2  Warfarin 4 mg daily except 8 mg on Tuesday  Renal: SCr 2.79  Heme: H&H 11.7/34.1, Plt 141  Goal of Therapy:  INR 2-3 Monitor platelets by anticoagulation protocol: Yes   Plan:  Warfarin 4 mg x 1 tonight Daily INR, CBC q72h Monitor for s/sx of bleeding  Levester Fresh, PharmD, BCPS, [facility] Clinical Pharmacist Pager [phone] 07/05/2015 6:42 PM

## 2015-07-05 NOTE — ED Notes (Signed)
Admitting at bedside 

## 2015-07-05 NOTE — ED Notes (Signed)
All belongings with patient per wife at admission

## 2015-07-05 NOTE — ED Provider Notes (Signed)
CSN: [id number]     Arrival date & time 07/05/15  1055 History   First MD Initiated Contact with Patient 07/05/15 1100     Chief Complaint  Patient presents with  . Emesis     (Consider location/radiation/quality/duration/timing/severity/associated sxs/prior Treatment) HPI Comments: Patient is an 81 year old male with past medical history of coronary artery disease status post CABG, hypertension, diabetes. He presents today for evaluation of vomiting. This started yesterday evening and has persisted through the night. He denies any blood in his vomit. There is been no diarrhea. He denies any fevers, abdominal pain, chest pain. He did feel somewhat dizzy this morning and presents for evaluation of this.  Patient is a 81 y.o. male presenting with vomiting. The history is provided by the patient.  Emesis Severity:  Moderate Duration:  12 hours Timing:  Constant Progression:  Worsening Chronicity:  New Recent urination:  Normal Relieved by:  Nothing Worsened by:  Nothing tried Ineffective treatments:  None tried Associated symptoms: no abdominal pain, no diarrhea and no fever     Past Medical History  Diagnosis Date  . Hypertension   . Diabetes mellitus   . Pacemaker   . Left leg DVT ([place])   . CKD (chronic kidney disease)   . Depression   . HTN (hypertension)   . Gait abnormality   . CAD (coronary artery disease)     with prior CABG 1991 and 2006 LIMA LAD, seq. SVG D1-Cfx, SVG d2,and seq. SVG AM-PDA Cardiology Dr. Tamala Julian  . Dementia   . Cancer [facility])     prostate   Past Surgical History  Procedure Laterality Date  . Prostatectomy    . Coronary artery bypass graft     Family History  Problem Relation Age of Onset  . Diabetes Mother   . Heart disease Sister   . Heart disease Sister   . Heart disease Sister   . Heart disease Sister    Social History  Substance Use Topics  . Smoking status: Never Smoker   . Smokeless tobacco: Never Used  . Alcohol Use: No    Review  of Systems  Gastrointestinal: Positive for vomiting. Negative for abdominal pain and diarrhea.  All other systems reviewed and are negative.     Allergies  Iodinated diagnostic agents; Iohexol; Ace inhibitors; Simvastatin; and Aspirin  Home Medications   Prior to Admission medications   Medication Sig Start Date End Date Taking? Authorizing Provider  amLODipine (NORVASC) 10 MG tablet Take 10 mg by mouth daily. 12/31/14   Historical Provider, MD  atenolol (TENORMIN) 50 MG tablet Take 50 mg by mouth 2 (two) times daily.     Historical Provider, MD  buPROPion (WELLBUTRIN) 100 MG tablet Take 1 tablet by mouth daily. 12/09/14   Historical Provider, MD  CALCIUM PO Take 1 tablet by mouth daily.    Historical Provider, MD  Coenzyme Q10 (COQ10 PO) Take 1 capsule by mouth daily.    Historical Provider, MD  furosemide (LASIX) 20 MG tablet Take 10 mg by mouth daily. 01/29/15   Historical Provider, MD  Glucose Blood (FREESTYLE LITE TEST VI) USE AS DIRECTED 06/02/13   Historical Provider, MD  Insulin Detemir (LEVEMIR FLEXPEN) 100 UNIT/ML Pen Inject 16 Units into the skin every morning.    Historical Provider, MD  linagliptin (TRADJENTA) 5 MG TABS tablet Take 7.5 mg by mouth daily.    Historical Provider, MD  mirtazapine (REMERON) 15 MG tablet Take 1 tablet by mouth at bedtime. 12/07/14  Historical Provider, MD  Multiple Vitamins-Minerals (ICAPS PO) Take 1 tablet by mouth 2 (two) times daily.    Historical Provider, MD  olmesartan (BENICAR) 40 MG tablet Take 40 mg by mouth daily.     Historical Provider, MD  polyethylene glycol (MIRALAX / GLYCOLAX) packet Take 17 g by mouth daily.    Historical Provider, MD  Probiotic Product (ALIGN PO) Take 1 capsule by mouth daily.    Historical Provider, MD  SENNA PO Take 1 tablet by mouth daily.    Historical Provider, MD  sertraline (ZOLOFT) 50 MG tablet Take by mouth at bedtime. TAKE 3 TABLETS    Historical Provider, MD  warfarin (COUMADIN) 4 MG tablet TAKE 1 TABLET  BY MOUTH AS DIRECTED 06/23/15   Belva Crome, MD   BP 158/77 mmHg  Pulse 70  Temp(Src) 98.1 F (36.7 C) (Oral)  Resp 18  Ht 5\' 10"  (1.778 m)  Wt 150 lb (68.04 kg)  BMI 21.52 kg/m2  SpO2 100% Physical Exam  Constitutional: He is oriented to person, place, and time. He appears well-developed and well-nourished. No distress.  HENT:  Head: Normocephalic and atraumatic.  Mouth/Throat: Oropharynx is clear and moist.  Eyes: EOM are normal. Pupils are equal, round, and reactive to light.  Neck: Normal range of motion. Neck supple.  Cardiovascular: Normal rate, regular rhythm and normal heart sounds.   No murmur heard. Pulmonary/Chest: Effort normal and breath sounds normal. No respiratory distress. He has no wheezes. He has no rales.  Abdominal: Soft. Bowel sounds are normal. He exhibits no distension. There is no tenderness.  Genitourinary: Rectum normal and prostate normal.  There is brown stool in the rectum that does not appear bloody or melanotic. There are no obvious hemorrhoids.  Musculoskeletal: Normal range of motion. He exhibits no edema.  Lymphadenopathy:    He has no cervical adenopathy.  Neurological: He is alert and oriented to person, place, and time.  Skin: Skin is warm and dry. He is not diaphoretic.  Nursing note and vitals reviewed.   ED Course  Procedures (including critical care time) Labs Review Labs Reviewed  COMPREHENSIVE METABOLIC PANEL  CBC WITH DIFFERENTIAL/PLATELET  LIPASE, BLOOD  I-STAT TROPOININ, ED    Imaging Review No results found. I have personally reviewed and evaluated these images and lab results as part of my medical decision-making.   EKG Interpretation   Date/Time:  Saturday July 05 2015 11:02:52 EST Ventricular Rate:  68 PR Interval:  36 QRS Duration: 172 QT Interval:  473 QTC Calculation: 503 R Axis:   -79 Text Interpretation:  Atrial-ventricular dual-paced rhythm No further  analysis attempted due to paced rhythm  Confirmed by Karrie Fluellen  MD, Gorgeous Newlun  ([id number]) on 07/05/2015 11:08:04 AM      MDM   Final diagnoses:  None    Patient presents here with complaints of vomiting that started yesterday and significantly worsened this morning. He is denying abdominal pain. His workup thus far reveals no elevation of white count, however there is a bump in his creatinine to 2.9. The patient's daughter is a gastroenterologist in [place] who has provided additional history. She is concerned about a 10 pound weight loss in the past month and reports that he had had some sort of bowel obstruction in the past. She is requesting additional studies be performed. Obstruction series reveals stool, however no evidence for bowel obstruction. Rectal exam reveals brown stool that is not melanotic or bloody, however is mildly heme positive. Amylase is mildly elevated  as is lipase.  The patient was hydrated with 1 L of normal saline. Due to his acute kidney injury, he will be admitted for further hydration and repeat metabolic panels. I've spoken with the teaching service who agrees to admit.    Veryl Speak, MD 07/05/15 [phone]

## 2015-07-05 NOTE — ED Notes (Signed)
To ED via [facility]  From home with c/o vomiting started last night. Wife states pt is weak feeling, pt has hx of dementia-- hx from wife.

## 2015-07-05 NOTE — ED Notes (Signed)
Pt does not want zofran at present-- denies nausea.

## 2015-07-05 NOTE — ED Notes (Signed)
Daughter --- Dr. Beryl Meager -- called from [place]-- states pt has lost 10 # in a month, daughter is a gastroenterologist-- [phone] &  [phone].

## 2015-07-05 NOTE — H&P (Signed)
Date: 07/05/2015               Patient Name:  [name] MRN: [id number]  DOB: [birth year] Age / Sex: 81 y.o., male   PCP: [name], MD         Medical Service: Internal Medicine Teaching Service         Attending Physician: Dr. [name], MD    First Contact: Dr. Lovena Le Pager: [phone]  Second Contact: Dr. 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 Patient's wife states that he started vomiting shortly after dinner last night, which continued this morning.  The vomit is nonbloody and nonbilious without coffee ground appearance.  There is no associated diarrhea, fever, chills, sick contacts, runny nose, or congestion.  No one else in the house has similar symptoms.  After vomiting this morning, the patient expressed dizziness when standing.    He has had decreased appetite for the last few years, worsened over the last month with 10 lb unintentional weight loss. Wife states that he will eat a good breakfast, but is otherwise not hungry and will not ask for food.  He denies early satiety, but wife states he only eats a small amount of food and then says he is full.  For the last few years, he may have 2 week periods where he has decreased appetite which will spontaneously resolve.  This month is the longest it has lasted.   He has a h/o prostate cancer diagnosed 18-20 years ago, treated with prostatectomy and radiation without chemotherapy.  He has been followed by a urologist whom recently retired.  He has not established care with another urologist.  He denies night sweats.  He continues to have urinary frequency, but denies dysuria or urgency.      He has CAD s/p CABG.  Patient's wife reports 3 operations, with the last operation being about 8 years ago.    At baseline, the patient performs his ADLs with assistance.  He is unable to perform his IADLs.  He ambulates with a walker.         He denies dyspnea, orthopnea, chest pain, leg swelling, diarrhea, or dysphagia.  He endorses chronic constipation treated with miralax.  Meds: Current Facility-Administered Medications  Medication Dose Route Frequency Provider Last Rate Last Dose  . ondansetron (ZOFRAN) injection 4 mg  4 mg Intravenous Once Veryl Speak, MD       Current Outpatient Prescriptions  Medication Sig Dispense Refill  . amLODipine (NORVASC) 10 MG tablet Take 10 mg by mouth daily.  4  . atenolol (TENORMIN) 50 MG tablet Take 50 mg by mouth 2 (two) times daily.     Marland Kitchen buPROPion (WELLBUTRIN) 100 MG tablet Take 1 tablet by mouth daily.  2  . CALCIUM PO Take 1 tablet by mouth daily.    . Coenzyme Q10 (COQ10 PO) Take 1 capsule by mouth daily.    . furosemide (LASIX) 20 MG tablet Take 10 mg by mouth daily.  5  . Glucose Blood (FREESTYLE LITE TEST VI) USE AS DIRECTED    . Insulin Detemir (LEVEMIR FLEXPEN) 100 UNIT/ML Pen Inject 16  Units into the skin every morning.    . linagliptin (TRADJENTA) 5 MG TABS tablet Take 7.5 mg by mouth daily.    . mirtazapine (REMERON) 15 MG tablet Take 1 tablet by mouth at bedtime.  3  . Multiple Vitamins-Minerals (ICAPS PO) Take 1 tablet by mouth 2 (two) times daily.    Marland Kitchen olmesartan (BENICAR) 40 MG tablet Take 40 mg by mouth daily.     . polyethylene glycol (MIRALAX / GLYCOLAX) packet Take 17 g by mouth daily.    . Probiotic Product (ALIGN PO) Take 1 capsule by mouth daily.    . SENNA PO Take 1 tablet by mouth daily.    . sertraline (ZOLOFT) 50 MG tablet Take by mouth at bedtime. TAKE 3 TABLETS    . warfarin (COUMADIN) 4 MG tablet TAKE 1 TABLET BY MOUTH AS DIRECTED 50 tablet 3    Allergies: Allergies as of 07/05/2015 - Review Complete  07/05/2015  Allergen Reaction Noted  . Iodinated diagnostic agents Anaphylaxis 06/06/2014  . Iohexol Anaphylaxis 09/23/2009  . Ace inhibitors Other (See Comments) 06/06/2014  . Simvastatin Other (See Comments) 06/06/2014  . Aspirin  04/30/2015   Past Medical History  Diagnosis Date  . Hypertension   . Diabetes mellitus   . Pacemaker   . Left leg DVT ([place])   . CKD (chronic kidney disease)   . Depression   . HTN (hypertension)   . Gait abnormality   . CAD (coronary artery disease)     with prior CABG 1991 and 2006 LIMA LAD, seq. SVG D1-Cfx, SVG d2,and seq. SVG AM-PDA Cardiology Dr. Tamala Julian  . Dementia   . Cancer [facility])     prostate   Past Surgical History  Procedure Laterality Date  . Prostatectomy    . Coronary artery bypass graft     Family History  Problem Relation Age of Onset  . Diabetes Mother   . Heart disease Sister   . Heart disease Sister   . Heart disease Sister   . Heart disease Sister    Social History   Social History  . Marital Status: Married    Spouse Name: N/A  . Number of Children: N/A  . Years of Education: N/A   Occupational History  . Not on file.   Social History Main Topics  . Smoking status: Never Smoker   . Smokeless tobacco: Never Used  . Alcohol Use: No  . Drug Use: No  . Sexual Activity: Not on file   Other Topics Concern  . Not on file   Social History Narrative    Review of Systems: Pertinent items are noted in HPI.  Physical Exam: Blood pressure 162/84, pulse 83, temperature 98.1 F (36.7 C), temperature source Oral, resp. rate 22, height 5\' 10"  (1.778 m), weight 150 lb (68.04 kg), SpO2 97 %. Physical Exam  Constitutional: He is well-developed, well-nourished, and in no distress.  HENT:  Head: Normocephalic and atraumatic.  MMM.  Normal cap refill.  Eyes: EOM are normal. No scleral icterus.  Neck: No JVD present. No tracheal deviation present.  Cardiovascular: Normal rate, regular rhythm, normal heart sounds and  intact distal pulses.   Paced rhythm.  Pulmonary/Chest: Effort normal. No respiratory distress. He has no wheezes.  No crackles.  Abdominal: Soft. He exhibits no distension. There is no tenderness. There is no rebound and no guarding.  Normal-to-hyperactive bowel sounds.  Musculoskeletal: He exhibits no edema.  Neurological: He is alert.  Oriented to person.  Moderately oriented to  place and time.  Skin: Skin is warm and dry. No rash noted.     Lab results: Basic Metabolic Panel:  Recent Labs  07/05/15 1142  NA 139  K 3.9  CL 103  CO2 27  GLUCOSE 166*  BUN 31*  CREATININE 2.79*  CALCIUM 9.6   Liver Function Tests:  Recent Labs  07/05/15 1142  AST 21  ALT 18  ALKPHOS 55  BILITOT 1.4*  PROT 6.8  ALBUMIN 3.4*    Recent Labs  07/05/15 1136 07/05/15 1142  LIPASE  --  54*  AMYLASE 128*  --    No results for input(s): AMMONIA in the last 72 hours. CBC:  Recent Labs  07/05/15 1142  WBC 4.6  NEUTROABS 3.4  HGB 11.7*  HCT 34.1*  MCV 88.6  PLT 141*   Cardiac Enzymes: No results for input(s): CKTOTAL, CKMB, CKMBINDEX, TROPONINI in the last 72 hours. BNP: No results for input(s): PROBNP in the last 72 hours. D-Dimer: No results for input(s): DDIMER in the last 72 hours. CBG: No results for input(s): GLUCAP in the last 72 hours. Hemoglobin A1C: No results for input(s): HGBA1C in the last 72 hours. Fasting Lipid Panel: No results for input(s): CHOL, HDL, LDLCALC, TRIG, CHOLHDL, LDLDIRECT in the last 72 hours. Thyroid Function Tests: No results for input(s): TSH, T4TOTAL, FREET4, T3FREE, THYROIDAB in the last 72 hours. Anemia Panel: No results for input(s): VITAMINB12, FOLATE, FERRITIN, TIBC, IRON, RETICCTPCT in the last 72 hours. Coagulation: No results for input(s): LABPROT, INR in the last 72 hours. Urine Drug Screen: Drugs of Abuse  No results found for: LABOPIA, COCAINSCRNUR, LABBENZ, AMPHETMU, THCU, LABBARB  Alcohol Level: No results for  input(s): ETH in the last 72 hours. Urinalysis:  Recent Labs  07/05/15 1459  COLORURINE YELLOW  LABSPEC 1.011  PHURINE 7.5  GLUCOSEU NEGATIVE  HGBUR NEGATIVE  BILIRUBINUR NEGATIVE  KETONESUR 15*  PROTEINUR 30*  NITRITE NEGATIVE  LEUKOCYTESUR NEGATIVE   Misc. Labs:   Imaging results:  Dg Abd Acute W/chest  07/05/2015  CLINICAL DATA:  Vomiting since last night. EXAM: DG ABDOMEN ACUTE W/ 1V CHEST COMPARISON:  Previous examinations. FINDINGS: Enlarged cardiac silhouette with an increase in size since 12/29/2012. Stable post CABG changes and left subclavian bipolar pacemaker leads. Mildly progressive linear scarring in the right upper lung zone. Otherwise, clear lungs. Normal bowel gas pattern without free peritoneal air. Mildly prominent stool. Inferior vena cava filter tip at the upper L2 level. Bilateral pelvic surgical clips. Lumbar and thoracic spine degenerative changes. IMPRESSION: 1. No acute abnormality. 2. Interval cardiomegaly. 3. Mildly prominent stool. Electronically Signed   By: Claudie Revering M.D.   On: 07/05/2015 14:24    Other results: EKG: unchanged from previous tracings, paced rhythm.  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  Vomiting: 1 day h/o nonbloody vomiting.  Patient's last emesis was this morning without repeat episode in the ED.  Underlying etiology unclear, including viral gastroenteritis plus/minus exacerbation by stool burden, pancreatitis, cholelithiasis, obstruction, or gastritis.  Amylase/lipase  slightly elevated, but patient denies EtOH use or abdominal pain.  AXR demonstrates stool burden and negative for free air.  Patient's physical exam benign, making other etiologies unlikely.  Will provide anti-emetics and monitor. - NS @ 100 ml/hr x 8 hours. [ ]  CMP in AM - Zofran PRN - Clear liquid diet  AKI on CKD4: Patient's creatinine elevated to 2.8 from 2.3 in 2013.  Unclear if this is an acute rise in the setting of vomiting and dehydration, or chronic progression of CKD 2/2 age, HTN, and DMII.  As patient has been vomiting with decreased PO intake, will rehydrate with IVF and monitor Cr.  Will be gentle with IVF given his HFrEF.  He is s/p 500 cc bolus in the ED and appeared euvolemic on exam.   - NS @ 100 ml/hr x 8 hours. - HOLD Lasix - HOLD Olmesartan  Weight Loss: 10 lb weight loss over last month, with decreased appetite and early satiety.  Patient previously on Remeron, which was stopped 3 months ago.  He has a h/o prostate cancer, for which he was followed until recently.  No night sweats or other red flag symptoms.  Most likely cause of decreased appetite is his baseline dementia.  As he has been vomiting, will start with clear liquids and obtain nutrition consult. - Clear liquid diet - Nutrition consult  HFrEF, CAD, Afib, HTN: stable. No chest pain or dyspnea. Troponin negative.  Continue home medications excepting nephrotoxins.  Unclear why patient is not on statin therapy. - Amlodipine 10mg  - Atenolol 50 mg BID - Warfarin - HOLD Lasix - HOLD Olmesartan  DMII: Patient on PRN Levemir due to insulin sensitivity.  Last A1c 6.8% in 2011.  - SSI-Sensitive - HOLD Linagliptin  Dementia, Depression: Consider restarting Remeron for appetite stimulant. - Sertraline 150 mg - Wellbutrin 100 mg   FEN/GI: - Miralax, Senna - Clear liquids  DVT Ppx: Warfarin  Dispo: Disposition is deferred at this time, awaiting improvement of current medical problems. Anticipated discharge in  approximately 1 day(s).   The patient does have a current PCP [name], MD) and does need an [facility] hospital follow-up appointment after discharge.  The patient does not have transportation limitations that hinder transportation to clinic appointments.  Signed: Iline Oven, MD, PhD 07/05/2015, 4:05 PM

## 2015-07-06 ENCOUNTER — Observation Stay (HOSPITAL_COMMUNITY): Payer: Medicare Other

## 2015-07-06 DIAGNOSIS — R634 Abnormal weight loss: Secondary | ICD-10-CM

## 2015-07-06 DIAGNOSIS — I4891 Unspecified atrial fibrillation: Secondary | ICD-10-CM

## 2015-07-06 DIAGNOSIS — I2510 Atherosclerotic heart disease of native coronary artery without angina pectoris: Secondary | ICD-10-CM

## 2015-07-06 DIAGNOSIS — I130 Hypertensive heart and chronic kidney disease with heart failure and stage 1 through stage 4 chronic kidney disease, or unspecified chronic kidney disease: Secondary | ICD-10-CM

## 2015-07-06 DIAGNOSIS — N179 Acute kidney failure, unspecified: Secondary | ICD-10-CM

## 2015-07-06 DIAGNOSIS — F0390 Unspecified dementia without behavioral disturbance: Secondary | ICD-10-CM

## 2015-07-06 DIAGNOSIS — Z794 Long term (current) use of insulin: Secondary | ICD-10-CM

## 2015-07-06 DIAGNOSIS — R1110 Vomiting, unspecified: Secondary | ICD-10-CM

## 2015-07-06 DIAGNOSIS — E860 Dehydration: Secondary | ICD-10-CM

## 2015-07-06 DIAGNOSIS — Z7951 Long term (current) use of inhaled steroids: Secondary | ICD-10-CM

## 2015-07-06 DIAGNOSIS — Z95810 Presence of automatic (implantable) cardiac defibrillator: Secondary | ICD-10-CM

## 2015-07-06 DIAGNOSIS — N184 Chronic kidney disease, stage 4 (severe): Secondary | ICD-10-CM

## 2015-07-06 DIAGNOSIS — Z7901 Long term (current) use of anticoagulants: Secondary | ICD-10-CM

## 2015-07-06 DIAGNOSIS — I5022 Chronic systolic (congestive) heart failure: Secondary | ICD-10-CM

## 2015-07-06 DIAGNOSIS — E1122 Type 2 diabetes mellitus with diabetic chronic kidney disease: Secondary | ICD-10-CM | POA: Diagnosis not present

## 2015-07-06 LAB — COMPREHENSIVE METABOLIC PANEL
ALBUMIN: 3.2 g/dL — AB (ref 3.5–5.0)
ALK PHOS: 43 U/L (ref 38–126)
ALT: 20 U/L (ref 17–63)
ANION GAP: 8 (ref 5–15)
AST: 25 U/L (ref 15–41)
BUN: 24 mg/dL — ABNORMAL HIGH (ref 6–20)
CHLORIDE: 109 mmol/L (ref 101–111)
CO2: 24 mmol/L (ref 22–32)
Calcium: 9 mg/dL (ref 8.9–10.3)
Creatinine, Ser: 2.36 mg/dL — ABNORMAL HIGH (ref 0.61–1.24)
GFR calc Af Amer: 26 mL/min — ABNORMAL LOW (ref >60)
GFR calc non Af Amer: 23 mL/min — ABNORMAL LOW (ref >60)
GLUCOSE: 133 mg/dL — AB (ref 65–99)
POTASSIUM: 3.8 mmol/L (ref 3.5–5.1)
SODIUM: 141 mmol/L (ref 135–145)
Total Bilirubin: 0.9 mg/dL (ref 0.3–1.2)
Total Protein: 6.3 g/dL — ABNORMAL LOW (ref 6.5–8.1)

## 2015-07-06 LAB — GLUCOSE, CAPILLARY
GLUCOSE-CAPILLARY: 129 mg/dL — AB (ref 65–99)
Glucose-Capillary: 134 mg/dL — ABNORMAL HIGH (ref 65–99)
Glucose-Capillary: 142 mg/dL — ABNORMAL HIGH (ref 65–99)
Glucose-Capillary: 116 mg/dL — ABNORMAL HIGH (ref 65–99)

## 2015-07-06 LAB — CBC
HCT: 32.8 % — ABNORMAL LOW (ref 39.0–52.0)
HEMOGLOBIN: 11.2 g/dL — AB (ref 13.0–17.0)
MCH: 30.1 pg (ref 26.0–34.0)
MCHC: 34.1 g/dL (ref 30.0–36.0)
MCV: 88.2 fL (ref 78.0–100.0)
Platelets: 136 10*3/uL — ABNORMAL LOW (ref 150–400)
RBC: 3.72 MIL/uL — ABNORMAL LOW (ref 4.22–5.81)
RDW: 12.9 % (ref 11.5–15.5)
WBC: 4.6 10*3/uL (ref 4.0–10.5)

## 2015-07-06 LAB — LIPASE, BLOOD: Lipase: 72 U/L — ABNORMAL HIGH (ref 11–51)

## 2015-07-06 LAB — AMYLASE: Amylase: 126 U/L — ABNORMAL HIGH (ref 28–100)

## 2015-07-06 MED ORDER — BARIUM SULFATE 2.1 % PO SUSP
ORAL | Status: AC
  Administered 2015-07-06: 1 mL
  Filled 2015-07-06: qty 1

## 2015-07-06 MED ORDER — WARFARIN SODIUM 4 MG PO TABS
8.0000 mg | ORAL_TABLET | ORAL | Status: DC
Start: 2015-07-08 — End: 2015-07-07

## 2015-07-06 MED ORDER — WARFARIN SODIUM 4 MG PO TABS
4.0000 mg | ORAL_TABLET | ORAL | Status: DC
  Administered 2015-07-06: 4 mg via ORAL
  Filled 2015-07-06 (×3): qty 1

## 2015-07-06 MED ORDER — SODIUM CHLORIDE 0.9 % IV SOLN
INTRAVENOUS | Status: AC
  Administered 2015-07-06: 12:00:00 via INTRAVENOUS

## 2015-07-06 MED ORDER — GLUCERNA SHAKE PO LIQD
237.0000 mL | Freq: Three times a day (TID) | ORAL | Status: DC
  Administered 2015-07-06 – 2015-07-07 (×2): 237 mL via ORAL

## 2015-07-06 MED ORDER — ONDANSETRON HCL 4 MG PO TABS: 4.0000 mg | ORAL_TABLET | Freq: Four times a day (QID) | ORAL | Status: DC | PRN

## 2015-07-06 NOTE — Discharge Summary (Signed)
Name: [name] MRN: [id number] DOB: [birth year] 81 y.o. PCP: Paul Poll, MD  Date of Admission: 07/05/2015 10:55 AM Date of Discharge: 07/06/2015 Attending Physician: Paul Falcon, MD  Discharge Diagnosis: 1. AKI 2. Vomiting   Principal Problem:   AKI (acute kidney injury) ([place]) Active Problems:   Pacemaker   HTN (hypertension)   Hyperlipidemia   CAD (coronary artery disease)   DM (diabetes mellitus) (HCC)   CKD (chronic kidney disease)   Dementia   Atrial fibrillation (HCC)   Chronic anticoagulation   Heart failure with reduced ejection fraction (HCC)   Acute on chronic renal failure (HCC)   Dehydration   Vomiting  Discharge Medications:   Medication List    TAKE these medications        ALIGN PO  Take 1 capsule by mouth daily.     amLODipine 10 MG tablet  Commonly known as:  NORVASC  Take 10 mg by mouth daily.     atenolol 50 MG tablet  Commonly known as:  TENORMIN  Take 50 mg by mouth 2 (two) times daily.     buPROPion 100 MG tablet  Commonly known as:  WELLBUTRIN  Take 1 tablet by mouth daily.     CALCIUM PO  Take 1 tablet by mouth daily.     COQ10 PO  Take 1 capsule by mouth daily.     FREESTYLE LITE TEST VI  USE AS DIRECTED     furosemide 20 MG tablet  Commonly known as:  LASIX  Take 10 mg by mouth daily.     ICAPS PO  Take 1 tablet by mouth 2 (two) times daily.     LEVEMIR FLEXPEN 100 UNIT/ML Pen  Generic drug:  Insulin Detemir  Inject 16 Units into the skin daily as needed (over 200).     linagliptin 5 MG Tabs tablet  Commonly known as:  TRADJENTA  Take 7.5 mg by mouth daily.     olmesartan 40 MG tablet  Commonly known as:  BENICAR  Take 40 mg by mouth daily.     ondansetron 4 MG tablet  Commonly known as:  ZOFRAN  Take 1 tablet (4 mg total) by mouth every 6 (six) hours as needed for nausea or vomiting.     polyethylene glycol packet  Commonly known as:  MIRALAX / GLYCOLAX  Take 17 g by mouth every other day.     SENNA PO  Take 1 tablet by mouth daily.     sertraline 50 MG tablet  Commonly known as:  ZOLOFT  Take 50 mg by mouth at bedtime.     warfarin 4 MG tablet  Commonly known as:  COUMADIN  Take 4-8 mg by mouth daily. 49m daily except 87mon Tuesday and Saturday        Disposition and follow-up:   [name]  At the hospital follow up visit please address:   Continued vomiting   Renal function  Bone Scintigraphy for evaluation of sacral sclerotic focus seen on CT  Weight loss and nutrition, with possible restarting of Remeron  2.  Labs / imaging needed at time of follow-up: Cr, amylase, lipase  3.  Pending labs/ test needing follow-up: none  Follow-up Appointments: Follow-up Information    Follow up with TRReymundo PollMD. Schedule an appointment as soon as possible for a visit in 1 week.   Specialty:  Family Medicine   Why:  eval vomiting, renal function, lipase   Contact information:   [address] [place] [address] [phone]       Discharge Instructions: Discharge Instructions    Call MD for:  persistant dizziness or light-headedness    Complete by:  As directed      Call MD for:  persistant nausea and vomiting    Complete by:  As directed      Diet - low sodium heart healthy    Complete by:  As directed      Increase activity slowly    Complete by:  As directed            Consultations:    Procedures Performed:  Dg Abd Acute W/chest  07/05/2015  CLINICAL DATA:  Vomiting since last night. EXAM: DG ABDOMEN ACUTE W/ 1V CHEST COMPARISON:  Previous examinations. FINDINGS: Enlarged cardiac silhouette with an increase in size since 12/29/2012. Stable post CABG changes and left subclavian bipolar pacemaker leads. Mildly progressive linear scarring in the right upper lung zone. Otherwise, clear lungs. Normal bowel gas pattern without free peritoneal air. Mildly  prominent stool. Inferior vena cava filter tip at the upper L2 level. Bilateral pelvic surgical clips. Lumbar and thoracic spine degenerative changes. IMPRESSION: 1. No acute abnormality. 2. Interval cardiomegaly. 3. Mildly prominent stool. Electronically Signed   By: Claudie Revering M.D.   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 Patient's wife states that he started vomiting shortly after dinner last night, which continued this morning. The vomit is nonbloody and nonbilious without coffee ground appearance. There is no associated diarrhea, fever, chills, sick contacts, runny nose, or congestion. No one else in the house has similar symptoms. After vomiting this morning, the patient expressed dizziness when standing.   He has had decreased appetite for the last few years, worsened over the last month with 10 lb unintentional weight loss. Wife states that he will eat a good breakfast, but is otherwise not hungry and will not ask for food. He denies early satiety, but wife states he only eats a small amount of food and then says he is full. For the last few years, he may have 2 week periods where he has decreased appetite which will spontaneously resolve. This month is the longest it has lasted.   He has a h/o prostate cancer diagnosed 18-20 years ago, treated with prostatectomy and radiation without chemotherapy. He has been followed by a urologist whom recently retired. He has not established care with another urologist. He denies night sweats. He continues to have urinary frequency, but denies dysuria or urgency.   He has CAD s/p CABG. Patient's wife reports 3 operations, with the last operation being about 8 years ago.   At baseline, the patient performs his ADLs with assistance. He  is unable to perform his IADLs. He ambulates with a walker.   He denies dyspnea, orthopnea, chest pain, leg swelling, diarrhea, or dysphagia. He endorses chronic constipation treated with miralax.  Hospital Course by problem list: Principal Problem:   AKI (acute kidney injury) ([place]) Active Problems:   Pacemaker   HTN (hypertension)   Hyperlipidemia   CAD (coronary artery disease)   DM (diabetes mellitus) ([place])   CKD (chronic kidney disease)  Dementia   Atrial fibrillation (HCC)   Chronic anticoagulation   Heart failure with reduced ejection fraction (HCC)   Acute on chronic renal failure (HCC)   Dehydration   Vomiting   Vomiting with Elevated Amylase/Lipase: Patient had no repeat episodes of vomiting in the hospital.  He denied nausea, abdominal pain, or other concern.  AXR demonstrated stool burden and negative for free air. Lipase increased to 150, but patient has benign physical exam and no symptoms. CT abdomen negative for pancreatic pathology or cancer metastasis (though noted a tiny sclerotic focus in left sacrum, likely benign, but met cannot be excluded). Per report from daughter, patient has a complicated pancreatic history with elevated amylase and lipase in the past. Previous workup, including CT abdomen and ERCP failed to demonstrate underlying etiology. Therefore, elevated lipase appears to be chronic issue. Further workup can be considered outpatient.  Follow-up with Dr. Cristina Gong has been arranged elevated Lipase.    AKI on CKD4: Creatinine improved to near-baseline following IVF overnight.  AKI likely due to vomiting, dehydration, and recent poor PO intake.  Patient should be reminded to drink appropriate amount of fluid.  Renal function should be evaluated at follow up.  Weight Loss: 10 lb unintentional weight loss.  This is likely due to his underlying dementia.  Nutrition consult Glucerna shakes between meals recommended for weight gain.  Restarting Remeron  should be considered for weight gain effects of medication.  Patchy Thoracolumbar Spine and Sacral Sclerotic Focus on CT: Infiltrative process and  Bone metastasis could not be excluded based on CT findings.  Consider further workup with bone scintigraphy for evaluation.    Discharge Vitals:   BP 155/61 mmHg  Pulse 65  Temp(Src) 98.3 F (36.8 C) (Oral)  Resp 18  Ht _0  (1.778 m)  Wt 150 lb (68.04 kg)  BMI 21.52 kg/m2  SpO2 99%  Discharge Labs:  Results for orders placed or performed during the hospital encounter of 07/05/15 (from the past 24 hour(s))  Amylase     Status: Abnormal   Collection Time: 07/05/15 11:36 AM  Result Value Ref Range   Amylase 128 (H) 28 - 100 U/L  Comprehensive metabolic panel     Status: Abnormal   Collection Time: 07/05/15 11:42 AM  Result Value Ref Range   Sodium 139 135 - 145 mmol/L   Potassium 3.9 3.5 - 5.1 mmol/L   Chloride 103 101 - 111 mmol/L   CO2 27 22 - 32 mmol/L   Glucose, Bld 166 (H) 65 - 99 mg/dL   BUN 31 (H) 6 - 20 mg/dL   Creatinine, Ser 2.79 (H) 0.61 - 1.24 mg/dL   Calcium 9.6 8.9 - 10.3 mg/dL   Total Protein 6.8 6.5 - 8.1 g/dL   Albumin 3.4 (L) 3.5 - 5.0 g/dL   AST 21 15 - 41 U/L   ALT 18 17 - 63 U/L   Alkaline Phosphatase 55 38 - 126 U/L   Total Bilirubin 1.4 (H) 0.3 - 1.2 mg/dL   GFR calc non Af Amer 19 (L) >60 mL/min   GFR calc Af Amer 22 (L) >60 mL/min   Anion gap 9 5 - 15  CBC with Differential     Status: Abnormal   Collection Time: 07/05/15 11:42 AM  Result Value Ref Range   WBC 4.6 4.0 - 10.5 K/uL   RBC 3.85 (L) 4.22 - 5.81 MIL/uL   Hemoglobin 11.7 (L) 13.0 - 17.0 g/dL   HCT 34.1 (L) 39.0 - 52.0 %  MCV 88.6 78.0 - 100.0 fL   MCH 30.4 26.0 - 34.0 pg   MCHC 34.3 30.0 - 36.0 g/dL   RDW 13.0 11.5 - 15.5 %   Platelets 141 (L) 150 - 400 K/uL   Neutrophils Relative % 73 %   Neutro Abs 3.4 1.7 - 7.7 K/uL   Lymphocytes Relative 18 %   Lymphs Abs 0.8 0.7 - 4.0 K/uL   Monocytes Relative 8 %   Monocytes Absolute 0.4  0.1 - 1.0 K/uL   Eosinophils Relative 1 %   Eosinophils Absolute 0.0 0.0 - 0.7 K/uL   Basophils Relative 0 %   Basophils Absolute 0.0 0.0 - 0.1 K/uL  Lipase, blood     Status: Abnormal   Collection Time: 07/05/15 11:42 AM  Result Value Ref Range   Lipase 54 (H) 11 - 51 U/L  I-stat troponin, ED     Status: None   Collection Time: 07/05/15 12:11 PM  Result Value Ref Range   Troponin i, poc 0.02 0.00 - 0.08 ng/mL   Comment 3          Urinalysis, Routine w reflex microscopic (not at [facility])     Status: Abnormal   Collection Time: 07/05/15  2:59 PM  Result Value Ref Range   Color, Urine YELLOW YELLOW   APPearance CLEAR CLEAR   Specific Gravity, Urine 1.011 1.005 - 1.030   pH 7.5 5.0 - 8.0   Glucose, UA NEGATIVE NEGATIVE mg/dL   Hgb urine dipstick NEGATIVE NEGATIVE   Bilirubin Urine NEGATIVE NEGATIVE   Ketones, ur 15 (A) NEGATIVE mg/dL   Protein, ur 30 (A) NEGATIVE mg/dL   Nitrite NEGATIVE NEGATIVE   Leukocytes, UA NEGATIVE NEGATIVE  Urine microscopic-add on     Status: Abnormal   Collection Time: 07/05/15  2:59 PM  Result Value Ref Range   Squamous Epithelial / LPF 0-5 (A) NONE SEEN   WBC, UA 0-5 0 - 5 WBC/hpf   RBC / HPF NONE SEEN 0 - 5 RBC/hpf   Bacteria, UA NONE SEEN NONE SEEN  Protime-INR     Status: Abnormal   Collection Time: 07/05/15  5:24 PM  Result Value Ref Range   Prothrombin Time 22.5 (H) 11.6 - 15.2 seconds   INR 2.00 (H) 0.00 - 1.49  Glucose, capillary     Status: Abnormal   Collection Time: 07/05/15  5:40 PM  Result Value Ref Range   Glucose-Capillary 130 (H) 65 - 99 mg/dL   Comment 1 Notify RN    Comment 2 Document in Chart   Protime-INR     Status: Abnormal   Collection Time: 07/05/15  7:24 PM  Result Value Ref Range   Prothrombin Time 25.2 (H) 11.6 - 15.2 seconds   INR 2.31 (H) 0.00 - 1.49  Glucose, capillary     Status: Abnormal   Collection Time: 07/05/15 10:22 PM  Result Value Ref Range   Glucose-Capillary 158 (H) 65 - 99 mg/dL   Comment 1 Notify  RN    Comment 2 Document in Chart   CBC     Status: Abnormal   Collection Time: 07/06/15  5:13 AM  Result Value Ref Range   WBC 4.6 4.0 - 10.5 K/uL   RBC 3.72 (L) 4.22 - 5.81 MIL/uL   Hemoglobin 11.2 (L) 13.0 - 17.0 g/dL   HCT 32.8 (L) 39.0 - 52.0 %   MCV 88.2 78.0 - 100.0 fL   MCH 30.1 26.0 - 34.0 pg   MCHC 34.1 30.0 -  36.0 g/dL   RDW 12.9 11.5 - 15.5 %   Platelets 136 (L) 150 - 400 K/uL  Comprehensive metabolic panel     Status: Abnormal   Collection Time: 07/06/15  5:13 AM  Result Value Ref Range   Sodium 141 135 - 145 mmol/L   Potassium 3.8 3.5 - 5.1 mmol/L   Chloride 109 101 - 111 mmol/L   CO2 24 22 - 32 mmol/L   Glucose, Bld 133 (H) 65 - 99 mg/dL   BUN 24 (H) 6 - 20 mg/dL   Creatinine, Ser 2.36 (H) 0.61 - 1.24 mg/dL   Calcium 9.0 8.9 - 10.3 mg/dL   Total Protein 6.3 (L) 6.5 - 8.1 g/dL   Albumin 3.2 (L) 3.5 - 5.0 g/dL   AST 25 15 - 41 U/L   ALT 20 17 - 63 U/L   Alkaline Phosphatase 43 38 - 126 U/L   Total Bilirubin 0.9 0.3 - 1.2 mg/dL   GFR calc non Af Amer 23 (L) >60 mL/min   GFR calc Af Amer 26 (L) >60 mL/min   Anion gap 8 5 - 15  Lipase, blood     Status: Abnormal   Collection Time: 07/06/15  5:13 AM  Result Value Ref Range   Lipase 72 (H) 11 - 51 U/L  Glucose, capillary     Status: Abnormal   Collection Time: 07/06/15  7:58 AM  Result Value Ref Range   Glucose-Capillary 134 (H) 65 - 99 mg/dL    Signed: Iline Oven, MD 07/06/2015, 11:10 AM    Services Ordered on Discharge: none Equipment Ordered on Discharge: none

## 2015-07-06 NOTE — Discharge Instructions (Addendum)
1. You may take Zofran 4 mg (1 tab) every 6 hours as needed for nausea. 2. You may drink Glucerna between meals as needed for weight gain.   Acute Kidney Injury Acute kidney injury is any condition in which there is sudden (acute) damage to the kidneys. Acute kidney injury was previously known as acute kidney failure or acute renal failure. The kidneys are two organs that lie on either side of the spine between the middle of the back and the front of the abdomen. The kidneys:  Remove wastes and extra water from the blood.   Produce important hormones. These help keep bones strong, regulate blood pressure, and help create red blood cells.   Balance the fluids and chemicals in the blood and tissues. A small amount of kidney damage may not cause problems, but a large amount of damage may make it difficult or impossible for the kidneys to work the way they should. Acute kidney injury may develop into long-lasting (chronic) kidney disease. It may also develop into a life-threatening disease called end-stage kidney disease. Acute kidney injury can get worse very quickly, so it should be treated right away. Early treatment may prevent other kidney diseases from developing. CAUSES   A problem with blood flow to the kidneys. This may be caused by:   Blood loss.   Heart disease.   Severe burns.   Liver disease.  Direct damage to the kidneys. This may be caused by:  Some medicines.   A kidney infection.   Poisoning or consuming toxic substances.   A surgical wound.   A blow to the kidney area.   A problem with urine flow. This may be caused by:   Cancer.   Kidney stones.   An enlarged prostate. SIGNS AND SYMPTOMS   Swelling (edema) of the legs, ankles, or feet.   Tiredness (lethargy).   Nausea or vomiting.   Confusion.   Problems with urination, such as:   Painful or burning feeling during urination.   Decreased urine production.   Frequent  accidents in children who are potty trained.   Bloody urine.   Muscle twitches and cramps.   Shortness of breath.   Seizures.   Chest pain or pressure. Sometimes, no symptoms are present. DIAGNOSIS Acute kidney injury may be detected and diagnosed by tests, including blood, urine, imaging, or kidney biopsy tests.  TREATMENT Treatment of acute kidney injury varies depending on the cause and severity of the kidney damage. In mild cases, no treatment may be needed. The kidneys may heal on their own. If acute kidney injury is more severe, your health care provider will treat the cause of the kidney damage, help the kidneys heal, and prevent complications from occurring. Severe cases may require a procedure to remove toxic wastes from the body (dialysis) or surgery to repair kidney damage. Surgery may involve:   Repair of a torn kidney.   Removal of an obstruction. HOME CARE INSTRUCTIONS  Follow your prescribed diet.  Take medicines only as directed by your health care provider.  Do not take any new medicines (prescription, over-the-counter, or nutritional supplements) unless approved by your health care provider. Many medicines can worsen your kidney damage or may need to have the dose adjusted.   Keep all follow-up visits as directed by your health care provider. This is important.  Observe your condition to make sure you are healing as expected. SEEK IMMEDIATE MEDICAL CARE IF:  You are feeling ill or have severe pain in the  back or side.   Your symptoms return or you have new symptoms.  You have any symptoms of end-stage kidney disease. These include:   Persistent itchiness.   Loss of appetite.   Headaches.   Abnormally dark or light skin.  Numbness in the hands or feet.   Easy bruising.   Frequent hiccups.   Menstruation stops.   You have a fever.  You have increased urine production.  You have pain or bleeding when urinating. MAKE SURE  YOU:   Understand these instructions.  Will watch your condition.  Will get help right away if you are not doing well or get worse.   This information is not intended to replace advice given to you by your health care provider. Make sure you discuss any questions you have with your health care provider.   Document Released: 02/01/2011 Document Revised: 08/09/2014 Document Reviewed: 03/17/2012 Elsevier Interactive Patient Education 2016 [place] on my medicine - Coumadin   (Warfarin)  This medication education was reviewed with me or my healthcare representative as part of my discharge preparation.  The pharmacist that spoke with me during my hospital stay was:  Wayland Salinas, [facility]  Why was Coumadin prescribed for you? Coumadin was prescribed for you because you have a blood clot or a medical condition that can cause an increased risk of forming blood clots. Blood clots can cause serious health problems by blocking the flow of blood to the heart, lung, or brain. Coumadin can prevent harmful blood clots from forming. As a reminder your indication for Coumadin is:   Stroke Prevention Because Of Atrial Fibrillation  What test will check on my response to Coumadin? While on Coumadin (warfarin) you will need to have an INR test regularly to ensure that your dose is keeping you in the desired range. The INR (international normalized ratio) number is calculated from the result of the laboratory test called prothrombin time (PT).  If an INR APPOINTMENT HAS NOT ALREADY BEEN MADE FOR YOU please schedule an appointment to have this lab work done by your health care provider within 7 days. Your INR goal is usually a number between:  2 to 3 or your provider may give you a more narrow range like 2-2.5.  Ask your health care provider during an office visit what your goal INR is.  What  do you need to  know  About  COUMADIN? Take Coumadin (warfarin) exactly as  prescribed by your healthcare provider about the same time each day.  DO NOT stop taking without talking to the doctor who prescribed the medication.  Stopping without other blood clot prevention medication to take the place of Coumadin may increase your risk of developing a new clot or stroke.  Get refills before you run out.  What do you do if you miss a dose? If you miss a dose, take it as soon as you remember on the same day then continue your regularly scheduled regimen the next day.  Do not take two doses of Coumadin at the same time.  Important Safety Information A possible side effect of Coumadin (Warfarin) is an increased risk of bleeding. You should call your healthcare provider right away if you experience any of the following: ? Bleeding from an injury or your nose that does not stop. ? Unusual colored urine (red or dark brown) or unusual colored stools (red or black). ? Unusual bruising for unknown reasons. ? A serious fall or  if you hit your head (even if there is no bleeding).  Some foods or medicines interact with Coumadin (warfarin) and might alter your response to warfarin. To help avoid this: ? Eat a balanced diet, maintaining a consistent amount of Vitamin K. ? Notify your provider about major diet changes you plan to make. ? Avoid alcohol or limit your intake to 1 drink for women and 2 drinks for men per day. (1 drink is 5 oz. wine, 12 oz. beer, or 1.5 oz. liquor.)  Make sure that ANY health care provider who prescribes medication for you knows that you are taking Coumadin (warfarin).  Also make sure the healthcare provider who is monitoring your Coumadin knows when you have started a new medication including herbals and non-prescription products.  Coumadin (Warfarin)  Major Drug Interactions  Increased Warfarin Effect Decreased Warfarin Effect  Alcohol (large quantities) Antibiotics (esp. Septra/Bactrim, Flagyl, Cipro) Amiodarone (Cordarone) Aspirin (ASA) Cimetidine  (Tagamet) Megestrol (Megace) NSAIDs (ibuprofen, naproxen, etc.) Piroxicam (Feldene) Propafenone (Rythmol SR) Propranolol (Inderal) Isoniazid (INH) Posaconazole (Noxafil) Barbiturates (Phenobarbital) Carbamazepine (Tegretol) Chlordiazepoxide (Librium) Cholestyramine (Questran) Griseofulvin Oral Contraceptives Rifampin Sucralfate (Carafate) Vitamin K   Coumadin (Warfarin) Major Herbal Interactions  Increased Warfarin Effect Decreased Warfarin Effect  Garlic Ginseng Ginkgo biloba Coenzyme Q10 Green tea St. Johns wort    Coumadin (Warfarin) FOOD Interactions  Eat a consistent number of servings per week of foods HIGH in Vitamin K (1 serving =  cup)  Collards (cooked, or boiled & drained) Kale (cooked, or boiled & drained) Mustard greens (cooked, or boiled & drained) Parsley *serving size only =  cup Spinach (cooked, or boiled & drained) Swiss chard (cooked, or boiled & drained) Turnip greens (cooked, or boiled & drained)  Eat a consistent number of servings per week of foods MEDIUM-HIGH in Vitamin K (1 serving = 1 cup)  Asparagus (cooked, or boiled & drained) Broccoli (cooked, boiled & drained, or raw & chopped) Brussel sprouts (cooked, or boiled & drained) *serving size only =  cup Lettuce, raw (green leaf, endive, romaine) Spinach, raw Turnip greens, raw & chopped   These websites have more information on Coumadin (warfarin):  FailFactory.se; VeganReport.com.au;

## 2015-07-06 NOTE — Progress Notes (Signed)
  Date: 07/06/2015  Patient name: [name]  Medical record number: [id number]  Date of birth: 1922-12-29   I have seen and evaluated [name] and discussed their care with the Residency Team. 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  The patient has dementia and was able to fill in some of the history.  The patients wife reported other parts of the history.  Unfortunately, Ms. [name] was not present, so history obtained by chart review and discussion with resident team.  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  He denied abdominal pain, but did have some dizziness on standing.  He has no other symptoms, diarrhea or nausea.  No family members are also sick.  On exam this morning, he noted no further emesis.  He has had a decreased appetite over the last few years, worsening this month with a 10 lb weight loss as well.  He apparently forgets to eat, is easily full and only eats once a day, sometimes with prompting.  He has no B symptoms at this time.     Filed Vitals:   07/05/15 2223 07/06/15 0608  BP: 152/69 155/61  Pulse: 80 65  Temp: 98.4 F (36.9 C) 98.3 F (36.8 C)  Resp: 20 18   Exam:  Gen: Elderly man, NAD, oriented to person and place being the hospital Eyes: Anicteric, no conjunctival pallor HENT: MMM, neck is supple CVS: RR, paced, + systolic murmur, legs are warm and well perfused Pulm: Breathing comfortably, no wheezing Abd: Soft, NT, ND, +BS Ext: Thin, no edema, warm and dry Neuro: orientation issues, these are chronic.  No focal deficit  Pertinent data Cr 2.79 -> 2.36 BUN 31 -> 24 Lipase mildly elevated  WBC 4.6 Hgb 11.2  UA normal, mild proteinuria (30)  AXR: no acute findings, mildly prominent stool  Assessment  and Plan: I have seen and evaluated the patient as outlined above. I agree with the formulated Assessment and Plan as detailed in the residents' daily note, with the following changes:   1. Vomiting, dehydration - Etiology liked food related, dehydration and possibly a viral gastroenteritis - He is improved today, no further emesis - Advance diet as tolerated - IVF with NS at 100cc overnight, would restart lasix - Renal function improved - Repeat labs with reassuring improvement in renal function  2. AKI on CKD 4 - Improved with hydration - Held olmesartan and lasix overnight - Consider changing lasix to every other day  3. Weight loss - Would have this worked up further outpatient, there are no concerning symptoms of malignancy at this time, no B symptoms.  His wife reported decreased PO intake, early satiety, forgetting to eat which may all be related to progressing dementia - Nutrition consult  Other issues are stable and noted in Dr. Tanna Carroll note from today.    [name], MD 12/4/20168:55 AM

## 2015-07-06 NOTE — Progress Notes (Addendum)
Shift progressed uneventfully. Patient slept well overnight and denied any pain. Patient exhibited a predominantly calm and comfortable, appropriately friendly demeanor. Patient demonstrated evidence of mild impulsivity, with propensity to get out of bed unassisted, and did not consistently follow commands. Patient showed both positive and negative responses to verbal redirection, with the latter being the more predominant response. Sitter services continued to ensure patient safety.

## 2015-07-06 NOTE — Progress Notes (Signed)
ANTICOAGULATION CONSULT NOTE - F/U Consult  Pharmacy Consult for warfarin Indication: atrial fibrillation  Allergies  Allergen Reactions  . Iodinated Diagnostic Agents Anaphylaxis  . Iohexol Anaphylaxis     Desc: RN states pt stated he had anyphalactic shock reaction to contrast approx 10 years ago.   . Ace Inhibitors Other (See Comments)    UNKNOWN  . Simvastatin Other (See Comments)    Muscle aches  . Aspirin     Other reaction(s): Abdominal Pain    Patient Measurements: Height: 5\' 10"  (177.8 cm) Weight: 150 lb (68.04 kg) IBW/kg (Calculated) : 73  Vital Signs: Temp: 98.3 F (36.8 C) (12/04 0608) Temp Source: Oral (12/04 0608) BP: 155/61 mmHg (12/04 0608) Pulse Rate: 65 (12/04 0608)  Labs:  Recent Labs  07/05/15 1142 07/05/15 1724 07/05/15 1924 07/06/15 0513  HGB 11.7*  --   --  11.2*  HCT 34.1*  --   --  32.8*  PLT 141*  --   --  136*  LABPROT  --  22.5* 25.2*  --   INR  --  2.00* 2.31*  --   CREATININE 2.79*  --   --  2.36*    Estimated Creatinine Clearance: 20.4 mL/min (by C-G formula based on Cr of 2.36).  Assessment: 81 yo male presenting with a 1 day hx of vomiting. 1 month hx of decreased appetite   PMH: HTH, DM2, CKD4, CAD s/p CABG, HFrEF 35-40% s/p ICD, Pulm HTN, AFib, Dementia, prostate ca s/p prostatectomy/radiation  AC: warfarin pta for Afib. Admit INR 2, No INR today 12/4.Hgb down slightly 11.7>11.2. Watch platelets 136. -Warfarin 4 mg daily except 8 mg on Tuesday  Goal of Therapy:  INR 2-3 Monitor platelets by anticoagulation protocol: Yes   Plan:  Continue home Coumadin regimen Daily INR, CBC q72h Monitor for s/sx of bleeding  Phillipe Clemon S. Alford Highland, PharmD, [facility] Clinical Staff Pharmacist Pager [phone] 07/06/2015 10:25 AM

## 2015-07-06 NOTE — Progress Notes (Signed)
Subjective: NAEON.  Patient denies repeat vomiting, nausea, diarrhea, fever, runny nose, congestion, or chills.  He denies pain or concern.  He is ready to go home today.  Objective: Vital signs in last 24 hours: Filed Vitals:   07/05/15 1617 07/05/15 1716 07/05/15 2223 07/06/15 0608  BP: 165/80 164/83 152/69 155/61  Pulse: 75 67 80 65  Temp:  97.9 F (36.6 C) 98.4 F (36.9 C) 98.3 F (36.8 C)  TempSrc:  Oral Oral Oral  Resp: 10  20 18   Height:      Weight:      SpO2: 100% 100% 100% 99%   Weight change:   Intake/Output Summary (Last 24 hours) at 07/06/15 0934 Last data filed at 07/06/15 [id number]  Gross per 24 hour  Intake   2240 ml  Output    665 ml  Net   1575 ml   Physical Exam  Constitutional: He is well-developed, well-nourished, and in no distress. No distress.  HENT:  Head: Normocephalic and atraumatic.  Mouth/Throat: Oropharynx is clear and moist.  Eyes: EOM are normal.  Neck: No JVD present. No tracheal deviation present.  Cardiovascular: Normal rate, regular rhythm, normal heart sounds and intact distal pulses.   Pulmonary/Chest: Effort normal and breath sounds normal. No respiratory distress. He has no wheezes.  Abdominal: Soft. He exhibits no distension. There is no tenderness. There is no rebound and no guarding.  Musculoskeletal: He exhibits no edema.  Neurological: He is alert.  Oriented to person, not place or time.  Skin: Skin is warm and dry. No rash noted. He is not diaphoretic.    Lab Results: Basic Metabolic Panel:  Recent Labs Lab 07/05/15 1142 07/06/15 0513  NA 139 141  K 3.9 3.8  CL 103 109  CO2 27 24  GLUCOSE 166* 133*  BUN 31* 24*  CREATININE 2.79* 2.36*  CALCIUM 9.6 9.0   Liver Function Tests:  Recent Labs Lab 07/05/15 1142 07/06/15 0513  AST 21 25  ALT 18 20  ALKPHOS 55 43  BILITOT 1.4* 0.9  PROT 6.8 6.3*  ALBUMIN 3.4* 3.2*    Recent Labs Lab 07/05/15 1136 07/05/15 1142 07/06/15 0513  LIPASE  --  54* 72*    AMYLASE 128*  --   --    No results for input(s): AMMONIA in the last 168 hours. CBC:  Recent Labs Lab 07/05/15 1142 07/06/15 0513  WBC 4.6 4.6  NEUTROABS 3.4  --   HGB 11.7* 11.2*  HCT 34.1* 32.8*  MCV 88.6 88.2  PLT 141* 136*   Cardiac Enzymes: No results for input(s): CKTOTAL, CKMB, CKMBINDEX, TROPONINI in the last 168 hours. BNP: No results for input(s): PROBNP in the last 168 hours. D-Dimer: No results for input(s): DDIMER in the last 168 hours. CBG:  Recent Labs Lab 07/05/15 1740 07/05/15 2222 07/06/15 0758  GLUCAP 130* 158* 134*   Hemoglobin A1C: No results for input(s): HGBA1C in the last 168 hours. Fasting Lipid Panel: No results for input(s): CHOL, HDL, LDLCALC, TRIG, CHOLHDL, LDLDIRECT in the last 168 hours. Thyroid Function Tests: No results for input(s): TSH, T4TOTAL, FREET4, T3FREE, THYROIDAB in the last 168 hours. Coagulation:  Recent Labs Lab 07/05/15 1724 07/05/15 1924  LABPROT 22.5* 25.2*  INR 2.00* 2.31*   Anemia Panel: No results for input(s): VITAMINB12, FOLATE, FERRITIN, TIBC, IRON, RETICCTPCT in the last 168 hours. Urine Drug Screen: Drugs of Abuse  No results found for: LABOPIA, COCAINSCRNUR, LABBENZ, AMPHETMU, THCU, LABBARB  Alcohol Level: No results for  input(s): ETH in the last 168 hours. Urinalysis:  Recent Labs Lab 07/05/15 1459  COLORURINE YELLOW  LABSPEC 1.011  PHURINE 7.5  GLUCOSEU NEGATIVE  HGBUR NEGATIVE  BILIRUBINUR NEGATIVE  KETONESUR 15*  PROTEINUR 30*  NITRITE NEGATIVE  LEUKOCYTESUR NEGATIVE   Misc. Labs:   Micro Results: No results found for this or any previous visit (from the past 240 hour(s)). Studies/Results: Dg Abd Acute W/chest  07/05/2015  CLINICAL DATA:  Vomiting since last night. EXAM: DG ABDOMEN ACUTE W/ 1V CHEST COMPARISON:  Previous examinations. FINDINGS: Enlarged cardiac silhouette with an increase in size since 12/29/2012. Stable post CABG changes and left subclavian bipolar pacemaker  leads. Mildly progressive linear scarring in the right upper lung zone. Otherwise, clear lungs. Normal bowel gas pattern without free peritoneal air. Mildly prominent stool. Inferior vena cava filter tip at the upper L2 level. Bilateral pelvic surgical clips. Lumbar and thoracic spine degenerative changes. IMPRESSION: 1. No acute abnormality. 2. Interval cardiomegaly. 3. Mildly prominent stool. Electronically Signed   By: Claudie Revering M.D.   On: 07/05/2015 14:24   Medications: I have reviewed the patient's current medications. Scheduled Meds: . amLODipine  10 mg Oral Daily  . atenolol  50 mg Oral BID  . buPROPion  100 mg Oral Daily  . insulin aspart  0-9 Units Subcutaneous TID WC  . senna  1 tablet Oral Daily  . sertraline  150 mg Oral QHS  . Warfarin - Pharmacist Dosing Inpatient   Does not apply q1800   Continuous Infusions:  PRN Meds:.alum & mag hydroxide-simeth, ondansetron (ZOFRAN) IV **OR** ondansetron, polyethylene glycol Assessment/Plan: Principal Problem:   AKI (acute kidney injury) ([place]) Active Problems:   Pacemaker   HTN (hypertension)   Hyperlipidemia   CAD (coronary artery disease)   DM (diabetes mellitus) (HCC)   CKD (chronic kidney disease)   Dementia   Atrial fibrillation (HCC)   Chronic anticoagulation   Heart failure with reduced ejection fraction [facility])  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  Vomiting, resolved: 1 day h/o nonbloody vomiting. Patient's last emesis was yesterday without repeat episode in the ED or floor. Underlying etiology unclear, including viral gastroenteritis plus/minus exacerbation by stool burden, pancreatitis, cholelithiasis, obstruction, or gastritis.AXR demonstrates stool burden and negative for free air. Amylase/lipase slightly elevated, but patient denies EtOH use or abdominal pain. Lipase  slightly increased this morning, but patient has benign physical exam and no symptoms.  Increases may be 2/2 previous vomiting.  CT abdomen not warranted at this time.    - Zofran PRN - Clear liquid diet  AKI on CKD4, improving: Patient's creatinine elevated to 2.8 from 2.3 in 2013.Trending down to 2.36 today.  Appears to be AKI in setting of vomiting, dehydration, and poor PO intake. Will restart home meds on discharge. - HOLD Lasix - HOLD Olmesartan  Weight Loss: 10 lb weight loss over last month, with decreased appetite and early satiety. Patient previously on Remeron, which was stopped 3 months ago. He has a h/o prostate cancer, for which he was followed until recently. No night sweats or other red flag symptoms. Most likely cause of decreased appetite is his baseline dementia. As he has been vomiting, will start with clear liquids and obtain nutrition consult. - Clear liquid diet - Nutrition consult  HFrEF, CAD, Afib, HTN: stable. No chest pain or dyspnea. Troponin negative.  Continue home medications excepting nephrotoxins. Unclear why patient is not on statin therapy. - Amlodipine 10mg  - Atenolol 50 mg BID - Warfarin - HOLD Lasix - HOLD Olmesartan  DMII: Patient on PRN Levemir due to insulin sensitivity. Last A1c 6.8% in 2011.  - SSI-Sensitive - HOLD Linagliptin  Dementia, Depression: Consider restarting Remeron for appetite stimulant at follow up. - Sertraline 150 mg - Wellbutrin 100 mg   FEN/GI: - Miralax, Senna - Clear liquids  DVT Ppx: Warfarin  Dispo: Disposition is deferred at this time, awaiting improvement of current medical problems.  Anticipated discharge in approximately 1 day(s).   The patient does have a current PCP [name], MD) and does need an [facility] hospital follow-up appointment after discharge.  The patient does not have transportation limitations that hinder transportation to clinic appointments.  .Services Needed at time of discharge: Y =  Yes, Blank = No PT:   OT:   RN:   Equipment:   Other:       Iline Oven, MD 07/06/2015, 9:34 AM

## 2015-07-07 DIAGNOSIS — N179 Acute kidney failure, unspecified: Secondary | ICD-10-CM | POA: Diagnosis not present

## 2015-07-07 LAB — PROTIME-INR
INR: 2.52 — ABNORMAL HIGH (ref 0.00–1.49)
Prothrombin Time: 26.8 seconds — ABNORMAL HIGH (ref 11.6–15.2)

## 2015-07-07 LAB — GLUCOSE, CAPILLARY
GLUCOSE-CAPILLARY: 137 mg/dL — AB (ref 65–99)
Glucose-Capillary: 200 mg/dL — ABNORMAL HIGH (ref 65–99)

## 2015-07-07 LAB — BASIC METABOLIC PANEL
ANION GAP: 8 (ref 5–15)
BUN: 20 mg/dL (ref 6–20)
CHLORIDE: 107 mmol/L (ref 101–111)
CO2: 25 mmol/L (ref 22–32)
Calcium: 8.6 mg/dL — ABNORMAL LOW (ref 8.9–10.3)
Creatinine, Ser: 2.35 mg/dL — ABNORMAL HIGH (ref 0.61–1.24)
GFR calc non Af Amer: 23 mL/min — ABNORMAL LOW (ref >60)
GFR, EST AFRICAN AMERICAN: 27 mL/min — AB (ref >60)
Glucose, Bld: 133 mg/dL — ABNORMAL HIGH (ref 65–99)
POTASSIUM: 3.6 mmol/L (ref 3.5–5.1)
SODIUM: 140 mmol/L (ref 135–145)

## 2015-07-07 LAB — CBC
HEMATOCRIT: 32.7 % — AB (ref 39.0–52.0)
HEMOGLOBIN: 11.1 g/dL — AB (ref 13.0–17.0)
MCH: 30 pg (ref 26.0–34.0)
MCHC: 33.9 g/dL (ref 30.0–36.0)
MCV: 88.4 fL (ref 78.0–100.0)
Platelets: 129 10*3/uL — ABNORMAL LOW (ref 150–400)
RBC: 3.7 MIL/uL — AB (ref 4.22–5.81)
RDW: 12.9 % (ref 11.5–15.5)
WBC: 4.9 10*3/uL (ref 4.0–10.5)

## 2015-07-07 LAB — LIPASE, BLOOD: LIPASE: 156 U/L — AB (ref 11–51)

## 2015-07-07 LAB — AMYLASE: AMYLASE: 182 U/L — AB (ref 28–100)

## 2015-07-07 MED ORDER — GLUCERNA SHAKE PO LIQD: 237.0000 mL | Freq: Three times a day (TID) | ORAL | Status: DC

## 2015-07-07 MED ORDER — SENNA 8.6 MG PO TABS: 1.0000 | ORAL_TABLET | Freq: Every day | ORAL | Status: DC | PRN

## 2015-07-07 NOTE — Progress Notes (Signed)
Subjective: NAEON.  Patient tolerated dinner well yesterday without nausea or vomiting.  He reportedly had 3 loose stools after receiving Senna.  He has no fever or abdominal pain.  He has not had a BM since last night.  Objective: Vital signs in last 24 hours: Filed Vitals:   07/06/15 1342 07/06/15 2111 07/06/15 2202 07/07/15 0642  BP: 137/64 154/82 149/65 121/105  Pulse: 61 64 65 73  Temp: 98.5 F (36.9 C) 98 F (36.7 C) 97.8 F (36.6 C) 98 F (36.7 C)  TempSrc: Oral Oral Oral Oral  Resp: $Remo'18 17 16 20  'OMVwB$ Height:      Weight:      SpO2: 99% 100% 100% 100%   Weight change:   Intake/Output Summary (Last 24 hours) at 07/07/15 0830 Last data filed at 07/07/15 0277  Gross per 24 hour  Intake 1830.75 ml  Output    350 ml  Net 1480.75 ml   Physical Exam  Constitutional: He is well-developed, well-nourished, and in no distress. No distress.  HENT:  Head: Normocephalic and atraumatic.  Mouth/Throat: Oropharynx is clear and moist.  Eyes: EOM are normal.  Neck: No JVD present. No tracheal deviation present.  Cardiovascular: Normal rate, regular rhythm, normal heart sounds and intact distal pulses.   Pulmonary/Chest: Effort normal and breath sounds normal. No respiratory distress. He has no wheezes.  Abdominal: Soft. He exhibits no distension. There is no tenderness. There is no rebound and no guarding.  Musculoskeletal: He exhibits no edema.  Neurological: He is alert.  Oriented to person, not place or time.  Skin: Skin is warm and dry. No rash noted. He is not diaphoretic.    Lab Results: Basic Metabolic Panel:  Recent Labs Lab 07/06/15 0513 07/07/15 0502  NA 141 140  K 3.8 3.6  CL 109 107  CO2 24 25  GLUCOSE 133* 133*  BUN 24* 20  CREATININE 2.36* 2.35*  CALCIUM 9.0 8.6*   Liver Function Tests:  Recent Labs Lab 07/05/15 1142 07/06/15 0513  AST 21 25  ALT 18 20  ALKPHOS 55 43  BILITOT 1.4* 0.9  PROT 6.8 6.3*  ALBUMIN 3.4* 3.2*    Recent Labs Lab  07/06/15 0513 07/07/15 0502  LIPASE 72* 156*  AMYLASE 126* 182*   No results for input(s): AMMONIA in the last 168 hours. CBC:  Recent Labs Lab 07/05/15 1142 07/06/15 0513 07/07/15 0502  WBC 4.6 4.6 4.9  NEUTROABS 3.4  --   --   HGB 11.7* 11.2* 11.1*  HCT 34.1* 32.8* 32.7*  MCV 88.6 88.2 88.4  PLT 141* 136* 129*   Cardiac Enzymes: No results for input(s): CKTOTAL, CKMB, CKMBINDEX, TROPONINI in the last 168 hours. BNP: No results for input(s): PROBNP in the last 168 hours. D-Dimer: No results for input(s): DDIMER in the last 168 hours. CBG:  Recent Labs Lab 07/05/15 2222 07/06/15 0758 07/06/15 1144 07/06/15 1655 07/06/15 2159 07/07/15 0807  GLUCAP 158* 134* 142* 116* 129* 137*   Hemoglobin A1C: No results for input(s): HGBA1C in the last 168 hours. Fasting Lipid Panel: No results for input(s): CHOL, HDL, LDLCALC, TRIG, CHOLHDL, LDLDIRECT in the last 168 hours. Thyroid Function Tests: No results for input(s): TSH, T4TOTAL, FREET4, T3FREE, THYROIDAB in the last 168 hours. Coagulation:  Recent Labs Lab 07/05/15 1724 07/05/15 1924 07/07/15 0502  LABPROT 22.5* 25.2* 26.8*  INR 2.00* 2.31* 2.52*   Anemia Panel: No results for input(s): VITAMINB12, FOLATE, FERRITIN, TIBC, IRON, RETICCTPCT in the last 168 hours. Urine  Drug Screen: Drugs of Abuse  No results found for: LABOPIA, COCAINSCRNUR, LABBENZ, AMPHETMU, THCU, LABBARB  Alcohol Level: No results for input(s): ETH in the last 168 hours. Urinalysis:  Recent Labs Lab 07/05/15 1459  COLORURINE YELLOW  LABSPEC 1.011  PHURINE 7.5  GLUCOSEU NEGATIVE  HGBUR NEGATIVE  BILIRUBINUR NEGATIVE  KETONESUR 15*  PROTEINUR 30*  NITRITE NEGATIVE  LEUKOCYTESUR NEGATIVE   Misc. Labs:   Micro Results: No results found for this or any previous visit (from the past 240 hour(s)). Studies/Results: Ct Abdomen Pelvis Wo Contrast  07/06/2015  CLINICAL DATA:  Vomiting. Decreased appetite. Weight loss. Inpatient.  Prostate cancer status post prostatectomy. EXAM: CT ABDOMEN AND PELVIS WITHOUT CONTRAST TECHNIQUE: Multidetector CT imaging of the abdomen and pelvis was performed following the standard protocol without IV contrast. COMPARISON:  06/26/2008 CT abdomen/ pelvis.  08/11/2007 PET-CT. FINDINGS: Lower chest: Trace bilateral pleural effusions, left greater than right, with associated mild bibasilar atelectasis. Cardiomegaly. Visualized lower median sternotomy wires are intact. The tips of pacer leads are noted in the right atrium and right ventricular apex. Hepatobiliary: Simple 1.5 cm posterior segment 7 right liver lobe cyst. Subcentimeter hypodense medial segment left liver lobe lesion, too small to characterize, stable since 08/11/2007, suggesting a benign lesion. No additional liver lesions. Normal gallbladder with no radiopaque cholelithiasis. No biliary ductal dilatation. Pancreas: Normal, with no mass or duct dilation. Spleen: Normal size. No mass. Adrenals/Urinary Tract: Normal adrenals. No hydronephrosis. No nephrolithiasis. There are bilateral simple renal cysts measuring up to 2.7 cm in the posterior right lower kidney and 2.3 cm in the interpolar left kidney. There is a minimally complex 2.1 cm renal cyst in the anterior lower right kidney with thin septal calcification, unchanged since 2009, in keeping with a benign Bosniak category 2 renal cyst. There is a minimally complex 2.8 cm renal cyst in the lateral interpolar left kidney with an septal calcification, unchanged since 2009, in keeping with a benign Bosniak category 2 renal cyst. Stable chronic mild diffuse bladder wall thickening, likely due to chronic bladder outlet obstruction. Stomach/Bowel: Grossly normal stomach. Normal caliber small bowel with no small bowel wall thickening. Normal appendix. Mild diverticulosis of the descending colon, with no large bowel wall thickening or pericolonic fat stranding. Vascular/Lymphatic: Atherosclerotic  nonaneurysmal abdominal aorta. IVC filter appears well positioned below the level of the renal veins. No pathologically enlarged lymph nodes in the abdomen or pelvis. Reproductive: Status post prostatectomy. Stable appearance of the prostatectomy bed, with no appreciable recurrent mass in the prostatectomy bed. Other: No pneumoperitoneum, ascites or focal fluid collection. Musculoskeletal: Tiny sclerotic focus in the left sacrum (series 2/ image 64) was not present on the 06/26/2008 CT. Tiny sclerotic focus in the medial left iliac bone is stable since 08/11/2007 and probably a benign bone island. There is a diffuse patchy appearance of the visualized thoracolumbar spine. IMPRESSION: 1. No acute abnormality. No evidence of bowel obstruction or acute bowel inflammation. Normal appendix. Mild distal colonic diverticulosis, with no evidence of acute diverticulitis. 2. Diffuse patchy appearance of the visualized thoracolumbar spine, which is nonspecific and could represent osteopenia, although an infiltrative process is not excluded. Tiny sclerotic focus in the left sacrum, not present on 2009 CT, cannot exclude a tiny osseous bone metastasis. Consider further evaluation with bone scintigraphy study. 3. Otherwise no potential evidence of metastatic disease in the abdomen or pelvis. No abdominopelvic lymphadenopathy. 4. Stable appearance status post prostatectomy, with no noncontrast CT findings to suggest local tumor recurrence. 5. Trace bilateral pleural  effusions with mild bibasilar atelectasis. Cardiomegaly. Electronically Signed   By: Ilona Sorrel M.D.   On: 07/06/2015 19:09   Dg Abd Acute W/chest  07/05/2015  CLINICAL DATA:  Vomiting since last night. EXAM: DG ABDOMEN ACUTE W/ 1V CHEST COMPARISON:  Previous examinations. FINDINGS: Enlarged cardiac silhouette with an increase in size since 12/29/2012. Stable post CABG changes and left subclavian bipolar pacemaker leads. Mildly progressive linear scarring in the  right upper lung zone. Otherwise, clear lungs. Normal bowel gas pattern without free peritoneal air. Mildly prominent stool. Inferior vena cava filter tip at the upper L2 level. Bilateral pelvic surgical clips. Lumbar and thoracic spine degenerative changes. IMPRESSION: 1. No acute abnormality. 2. Interval cardiomegaly. 3. Mildly prominent stool. Electronically Signed   By: Claudie Revering M.D.   On: 07/05/2015 14:24   Medications: I have reviewed the patient's current medications. Scheduled Meds: . amLODipine  10 mg Oral Daily  . atenolol  50 mg Oral BID  . buPROPion  100 mg Oral Daily  . feeding supplement (GLUCERNA SHAKE)  237 mL Oral TID BM  . insulin aspart  0-9 Units Subcutaneous TID WC  . sertraline  150 mg Oral QHS  . warfarin  4 mg Oral Once per day on Sun Mon Wed Thu Fri Sat  . [START ON 07/08/2015] warfarin  8 mg Oral Q Tue-1800  . Warfarin - Pharmacist Dosing Inpatient   Does not apply q1800   Continuous Infusions:  PRN Meds:.alum & mag hydroxide-simeth, ondansetron (ZOFRAN) IV **OR** ondansetron, polyethylene glycol, senna Assessment/Plan: Principal Problem:   AKI (acute kidney injury) ([place]) Active Problems:   Pacemaker   HTN (hypertension)   Hyperlipidemia   CAD (coronary artery disease)   DM (diabetes mellitus) (HCC)   CKD (chronic kidney disease)   Dementia   Atrial fibrillation (HCC)   Chronic anticoagulation   Heart failure with reduced ejection fraction (HCC)   Acute on chronic renal failure (HCC)   Dehydration   Vomiting  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  Vomiting, resolved: 1 day h/o nonbloody vomiting. Patient's last emesis was 12/3 without repeat episode in the ED or floor. Underlying etiology unclear, including viral gastroenteritis plus/minus exacerbation by stool burden, pancreatitis, cholelithiasis,  obstruction, or gastritis.AXR demonstrates stool burden and negative for free air.  Lipase increased to 150,  but patient has benign physical exam and no symptoms. CT abdomen negative for pancreatic pathology or cancer metastasis (though notes a tiny sclerotic focus in left sacrum, likely benign, but met cannot be excluded).  Per report from daughter, patient has a complicated pancreatic history with elevated amylase and lipase in the past.  Previous workup, including CT abdomen and ERCP failed to demonstrate underlying etiology.  Therefore, elevated lipase appears to be chronic issue.  Further workup can be considered outpatient.  Will arrange outpatient GI followup for further evaluation. - Zofran PRN - Clear liquid diet  Loose Stools: Patient with 3 reported loose stools yesterday without repeat BM since last night.  He received Senna yesterday.  He reportedly had antibiotics 6 weeks ago for a UTI, but has not had any loose stools prior to yesterday evening.  He does not live in a healthcare facility.  He has no fever or abdominal pain.  Low concern for C diff, though we will test if he has a repeat loose, watery stool. [?]  C diff  AKI on CKD4, resolved: Patient's creatinine elevated to 2.8 from 2.3 in 2013.Trending down to 2.3 today.  Appears to be AKI in setting of vomiting, dehydration, and poor PO intake. Will restart home meds on discharge. - HOLD Lasix - HOLD Olmesartan  Weight Loss: 10 lb weight loss over last month, with decreased appetite and early satiety. Patient previously on Remeron, which was stopped 3 months ago. He has a h/o prostate cancer, for which he was followed until recently. No night sweats or other red flag symptoms. Most likely cause of decreased appetite is his baseline dementia. As he has been vomiting, will start with clear liquids and obtain nutrition consult. - Clear liquid diet - Nutrition consult  HFrEF, CAD, Afib, HTN: stable. No chest pain or dyspnea.  Troponin negative. Continue home medications excepting nephrotoxins. Unclear why patient is not on statin therapy. - Amlodipine $RemoveBefo'10mg'hUNhieeyCQt$  - Atenolol 50 mg BID - Warfarin - HOLD Lasix - HOLD Olmesartan  DMII: Patient on PRN Levemir due to insulin sensitivity. Last A1c 6.8% in 2011.  - SSI-Sensitive - HOLD Linagliptin  Dementia, Depression: Consider restarting Remeron for appetite stimulant at follow up. - Sertraline 150 mg - Wellbutrin 100 mg   FEN/GI: - Miralax, Senna - Clear liquids  DVT Ppx: Warfarin  Dispo: Disposition is deferred at this time, awaiting improvement of current medical problems.  Anticipated discharge in approximately 1 day(s).   The patient does have a current PCP [name], MD) and does need an [facility] hospital follow-up appointment after discharge.  The patient does not have transportation limitations that hinder transportation to clinic appointments.  .Services Needed at time of discharge: Y = Yes, Blank = No PT:   OT:   RN:   Equipment:   Other:       Iline Oven, MD 07/07/2015, 8:30 AM

## 2015-07-07 NOTE — Progress Notes (Signed)
ANTICOAGULATION CONSULT NOTE - F/U Consult  Pharmacy Consult for Warfarin Indication: atrial fibrillation  Allergies  Allergen Reactions  . Iodinated Diagnostic Agents Anaphylaxis  . Iohexol Anaphylaxis     Desc: RN states pt stated he had anyphalactic shock reaction to contrast approx 10 years ago.   . Ace Inhibitors Other (See Comments)    UNKNOWN  . Simvastatin Other (See Comments)    Muscle aches  . Aspirin     Other reaction(s): Abdominal Pain    Patient Measurements: Height: 5\' 10"  (177.8 cm) Weight: 150 lb (68.04 kg) IBW/kg (Calculated) : 73  Vital Signs: Temp: 98 F (36.7 C) (12/05 0642) Temp Source: Oral (12/05 0642) BP: 121/105 mmHg (12/05 0642) Pulse Rate: 73 (12/05 0642)  Labs:  Recent Labs  07/05/15 1142 07/05/15 1724 07/05/15 1924 07/06/15 0513 07/07/15 0502  HGB 11.7*  --   --  11.2* 11.1*  HCT 34.1*  --   --  32.8* 32.7*  PLT 141*  --   --  136* 129*  LABPROT  --  22.5* 25.2*  --  26.8*  INR  --  2.00* 2.31*  --  2.52*  CREATININE 2.79*  --   --  2.36* 2.35*    Estimated Creatinine Clearance: 20.5 mL/min (by C-G formula based on Cr of 2.35).  Assessment: 81 yo male presenting with a 1 day hx of vomiting. 1 month hx of decreased appetite   PMH: HTH, DM2, CKD4, CAD s/p CABG, HFrEF 35-40% s/p ICD, Pulm HTN, AFib, Dementia, prostate ca s/p prostatectomy/radiation  AC: warfarin pta for Afib. Admit INR 2 INR therapeutic today -Warfarin 4 mg daily except 8 mg on Tuesday  Goal of Therapy:  INR 2-3 Monitor platelets by anticoagulation protocol: Yes   Plan:  Continue home Coumadin regimen Daily INR, CBC q72h Monitor for s/sx of bleeding  Thank you Anette Guarneri, PharmD [phone] 07/07/2015 9:13 AM

## 2015-07-07 NOTE — Progress Notes (Signed)
Pt has had 3 loose bowel movements in 24 hours. Paged on-call MD about getting a stool sample to rule out C. Diff. Awaiting return call from MD. Will continue to monitor.

## 2015-07-07 NOTE — Progress Notes (Signed)
Initial Nutrition Assessment  DOCUMENTATION CODES:   Not applicable  INTERVENTION:   -Continue Glucerna Shake po TID, each supplement provides 220 kcal and 10 grams of protein  NUTRITION DIAGNOSIS:   Predicted suboptimal nutrient intake related to lethargy/confusion as evidenced by per patient/family report.  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 Patient's wife states that he started vomiting shortly after dinner last night, which continued this morning. The vomit is nonbloody and nonbilious without coffee ground appearance. There is no associated diarrhea, fever, chills, sick contacts, runny nose, or congestion. No one else in the house has similar symptoms. After vomiting this morning, the patient expressed dizziness when standing.   Pt admitted with AKI and vomiting.   Received call from RN, who requested RD complete this consult ASAP, as pt is being discharged today.  Spoke with nurse tech at bedside, who reports that pt has a Charity fundraiser, who fed him breakfast this morning; she is unsure of intake. She requests that this RD return when private sitter is available to complete assessment.   Spoke with pt at bedside. He describes his appetite as "fair". He estimates he consumed "one third" of his breakfast this morning (jowever, documented meal completion 100%). He reports no changes to his appetite PTA or currently. He confirms that he did have nausea PTA. He denies any difficulty chewing or swallowing foods. He consumed 75% if his Glucerna supplement and is amenable to continue receiving them.  He reports his UBW is  between 140-150#. Per wt hx, wt has ranged between 145-160# over > 1 year. Weight has been stable over the past year; no evidence of weight loss per documented wt hx. He does not feel like he has lost weight and denies any changes in the way his clothing fits. He denies any changes in his activity level and reports "no problems" with mobility, describing himself as active ("I used to play soccer").   Nutrition-Focused physical exam completed. Findings are no fat depletion, mild muscle depletion, and no edema. Muscle depletion in lower extremities likely due to advanced age.   RD returned at 1153 to speak with private sitter. She confirms that he receives feeding assistance at home and has been trying to push fluids (juice, water) during hospitalization. She also confirms that pt is taking Glucerna supplements well. Discussed importance of use of supplements, particularly is pt is not eating well, to promote nutritional adequacy. Private sitter denied further nutritional concerns, but expressed appreciation for visit.   Labs reviewed: CBGS: 129-200, amylase: 182, lipase: 156.    Diet Order:  Diet Heart Room service appropriate?: Yes; Fluid consistency:: Thin Diet - low sodium heart healthy Diet - low sodium heart healthy  Skin:  Reviewed, no issues  Last BM:  07/06/15  Height:   Ht Readings from Last 1 Encounters:  07/05/15 5\' 10"  (1.778 m)    Weight:   Wt Readings from Last 1 Encounters:  07/05/15 150 lb (68.04 kg)    Ideal Body Weight:  75.5 kg  BMI:  Body mass index is 21.52 kg/(m^2).  Estimated Nutritional Needs:   Kcal:  1500-1700  Protein:  75-85 grams  Fluid:  1.5-1.7 L  EDUCATION NEEDS:   Education needs addressed  Ulanda Tackett A. Jimmye Norman, RD, LDN, CDE Pager: [phone] After hours Pager: [phone]

## 2015-07-07 NOTE — Care Management Note (Addendum)
Case Management Note  Patient Details  Name: RAPHEL HOPFINGER MRN: [id number] Date of Birth: May 17, 1923  Subjective/Objective:                 Patient placed in OBS from home with AKI. Patient denies having any DME. Patient has HHA present at hospital. Patient denies having any other nursing care at home and asked "why would I need that?" Attempted to reach wife to complete assessment, unsuccessful at home and mobile numbers.   Action/Plan:  Will continue to follow and offer resources as needed.  Spoke with wife, she declines any additional assistance at home, HHA from [facility] is with patient 3 hours a day, and wife states that no further help is necessary.   Expected Discharge Date:                  Expected Discharge Plan:  [place]  In-House Referral:     Discharge planning Services  CM Consult  Post Acute Care Choice:    Choice offered to:     DME Arranged:    DME Agency:     HH Arranged:    [place] Agency:     Status of Service:  In process, will continue to follow  Medicare Important Message Given:    Date Medicare IM Given:    Medicare IM give by:    Date Additional Medicare IM Given:    Additional Medicare Important Message give by:     If discussed at [place] of Stay Meetings, dates discussed:    Additional Comments:  Carles Collet, RN 07/07/2015, 11:52 AM

## 2015-07-09 ENCOUNTER — Other Ambulatory Visit: Payer: Self-pay | Admitting: Urology

## 2015-07-09 DIAGNOSIS — C61 Malignant neoplasm of prostate: Secondary | ICD-10-CM

## 2015-07-11 LAB — PROTIME-INR: INR: 3.3 — AB (ref 0.9–1.1)

## 2015-07-14 ENCOUNTER — Encounter (HOSPITAL_COMMUNITY)
Admission: RE | Admit: 2015-07-14 | Discharge: 2015-07-14 | Disposition: A | Discharge status: Home / Self Care | Payer: Medicare Other | Source: Ambulatory Visit | Attending: Urology | Admitting: Urology

## 2015-07-14 ENCOUNTER — Ambulatory Visit (INDEPENDENT_AMBULATORY_CARE_PROVIDER_SITE_OTHER): Payer: Medicare Other | Admitting: Cardiology

## 2015-07-14 DIAGNOSIS — C61 Malignant neoplasm of prostate: Secondary | ICD-10-CM | POA: Diagnosis not present

## 2015-07-14 DIAGNOSIS — I480 Paroxysmal atrial fibrillation: Secondary | ICD-10-CM

## 2015-07-14 DIAGNOSIS — Z5181 Encounter for therapeutic drug level monitoring: Secondary | ICD-10-CM

## 2015-07-14 MED ORDER — TECHNETIUM TC 99M MEDRONATE IV KIT
25.0000 | PACK | Freq: Once | INTRAVENOUS | Status: AC | PRN
  Administered 2015-07-14: 26.3 via INTRAVENOUS

## 2015-07-18 LAB — PROTIME-INR: INR: 1.9 — AB (ref 0.9–1.1)

## 2015-07-21 ENCOUNTER — Ambulatory Visit (INDEPENDENT_AMBULATORY_CARE_PROVIDER_SITE_OTHER): Payer: Medicare Other | Admitting: Cardiovascular Disease

## 2015-07-21 DIAGNOSIS — Z5181 Encounter for therapeutic drug level monitoring: Secondary | ICD-10-CM

## 2015-07-21 DIAGNOSIS — I480 Paroxysmal atrial fibrillation: Secondary | ICD-10-CM

## 2015-07-29 LAB — POCT INR: INR: 2.5

## 2015-07-30 ENCOUNTER — Ambulatory Visit (INDEPENDENT_AMBULATORY_CARE_PROVIDER_SITE_OTHER): Payer: Medicare Other | Admitting: Pharmacist

## 2015-07-30 DIAGNOSIS — I480 Paroxysmal atrial fibrillation: Secondary | ICD-10-CM

## 2015-07-30 DIAGNOSIS — Z5181 Encounter for therapeutic drug level monitoring: Secondary | ICD-10-CM

## 2015-08-08 ENCOUNTER — Telehealth: Payer: Self-pay | Admitting: Interventional Cardiology

## 2015-08-08 NOTE — Telephone Encounter (Signed)
Returned pt wife call. Adv pt wife that I have not called, neither has coumadin or the device clinic. Pt wife sts that there was no message left. Apologized for the inconvenience

## 2015-08-08 NOTE — Telephone Encounter (Signed)
New message      Returning a call to the nurse (our number was on the caller id)

## 2015-08-13 ENCOUNTER — Ambulatory Visit (INDEPENDENT_AMBULATORY_CARE_PROVIDER_SITE_OTHER): Payer: Medicare Other | Admitting: Internal Medicine

## 2015-08-13 DIAGNOSIS — Z5181 Encounter for therapeutic drug level monitoring: Secondary | ICD-10-CM

## 2015-08-13 DIAGNOSIS — I480 Paroxysmal atrial fibrillation: Secondary | ICD-10-CM

## 2015-08-13 LAB — POCT INR: INR: 1.8

## 2015-08-18 LAB — POCT INR: INR: 2.51

## 2015-08-19 ENCOUNTER — Ambulatory Visit (INDEPENDENT_AMBULATORY_CARE_PROVIDER_SITE_OTHER): Payer: Medicare Other | Admitting: Internal Medicine

## 2015-08-19 DIAGNOSIS — I480 Paroxysmal atrial fibrillation: Secondary | ICD-10-CM

## 2015-08-19 DIAGNOSIS — Z5181 Encounter for therapeutic drug level monitoring: Secondary | ICD-10-CM

## 2015-09-04 LAB — PROTIME-INR: INR: 2.1 — AB (ref 0.9–1.1)

## 2015-09-05 ENCOUNTER — Ambulatory Visit (INDEPENDENT_AMBULATORY_CARE_PROVIDER_SITE_OTHER): Payer: Medicare Other | Admitting: Cardiology

## 2015-09-05 DIAGNOSIS — Z5181 Encounter for therapeutic drug level monitoring: Secondary | ICD-10-CM

## 2015-09-05 DIAGNOSIS — I480 Paroxysmal atrial fibrillation: Secondary | ICD-10-CM

## 2015-09-12 LAB — PROTIME-INR: INR: 1.4 — AB (ref 0.9–1.1)

## 2015-09-16 ENCOUNTER — Ambulatory Visit (INDEPENDENT_AMBULATORY_CARE_PROVIDER_SITE_OTHER): Payer: Medicare Other | Admitting: Interventional Cardiology

## 2015-09-16 DIAGNOSIS — I480 Paroxysmal atrial fibrillation: Secondary | ICD-10-CM

## 2015-09-16 DIAGNOSIS — Z5181 Encounter for therapeutic drug level monitoring: Secondary | ICD-10-CM

## 2015-09-24 LAB — PROTIME-INR: INR: 1.4 — AB (ref 0.9–1.1)

## 2015-09-26 ENCOUNTER — Ambulatory Visit: Payer: Self-pay | Admitting: Cardiovascular Disease

## 2015-09-26 DIAGNOSIS — Z5181 Encounter for therapeutic drug level monitoring: Secondary | ICD-10-CM

## 2015-09-26 DIAGNOSIS — I480 Paroxysmal atrial fibrillation: Secondary | ICD-10-CM

## 2015-09-29 ENCOUNTER — Ambulatory Visit (INDEPENDENT_AMBULATORY_CARE_PROVIDER_SITE_OTHER): Payer: Medicare Other | Admitting: Cardiology

## 2015-09-29 DIAGNOSIS — I480 Paroxysmal atrial fibrillation: Secondary | ICD-10-CM

## 2015-09-29 DIAGNOSIS — Z5181 Encounter for therapeutic drug level monitoring: Secondary | ICD-10-CM

## 2015-09-29 LAB — PROTIME-INR: INR: 1.4 — AB (ref 0.9–1.1)

## 2015-10-03 LAB — PROTIME-INR: INR: 2.6 — AB (ref 0.9–1.1)

## 2015-10-06 ENCOUNTER — Ambulatory Visit (INDEPENDENT_AMBULATORY_CARE_PROVIDER_SITE_OTHER): Payer: Medicare Other | Admitting: Pharmacist

## 2015-10-06 DIAGNOSIS — Z5181 Encounter for therapeutic drug level monitoring: Secondary | ICD-10-CM

## 2015-10-06 DIAGNOSIS — I480 Paroxysmal atrial fibrillation: Secondary | ICD-10-CM

## 2015-10-10 LAB — PROTIME-INR: INR: 2.4 — AB (ref 0.9–1.1)

## 2015-10-13 ENCOUNTER — Ambulatory Visit (INDEPENDENT_AMBULATORY_CARE_PROVIDER_SITE_OTHER): Payer: Medicare Other | Admitting: Cardiovascular Disease

## 2015-10-13 DIAGNOSIS — I480 Paroxysmal atrial fibrillation: Secondary | ICD-10-CM

## 2015-10-13 DIAGNOSIS — Z5181 Encounter for therapeutic drug level monitoring: Secondary | ICD-10-CM

## 2015-10-29 ENCOUNTER — Ambulatory Visit (INDEPENDENT_AMBULATORY_CARE_PROVIDER_SITE_OTHER): Payer: Medicare Other | Admitting: Cardiology

## 2015-10-29 DIAGNOSIS — I480 Paroxysmal atrial fibrillation: Secondary | ICD-10-CM

## 2015-10-29 DIAGNOSIS — Z5181 Encounter for therapeutic drug level monitoring: Secondary | ICD-10-CM

## 2015-10-29 LAB — PROTIME-INR: INR: 1.8 — AB (ref 0.9–1.1)

## 2015-11-07 ENCOUNTER — Telehealth: Payer: Self-pay | Admitting: Interventional Cardiology

## 2015-11-07 NOTE — Telephone Encounter (Signed)
I called and spoke with Mrs. Hoos. She reports that the patient has had swelling to his feet/ he is SOB with ambulation over the last 3-4 days. He is reporting to her today "I just don't feel good." He started to complain of dizziness today. BP is 137/67 today. The patient does weigh- his baseline weight is 142-145 lbs typically. 3/26- he was 143 lbs 4/2- he was 150 lbs Per Mrs. Cockerill, she was increased his lasix from 10 mg daily to 20 mg daily x 3-4 days. She feels he is urinating ok. He is also able to lie down at night without difficulty. I advised I will review with Dr. Tamala Julian and call her back. She is agreeable.

## 2015-11-07 NOTE — Telephone Encounter (Signed)
New message    Patient wife calling  C/O dizzy, feeling sick - patient unable to describe what's wrong H/O dementia .   Pt c/o swelling: STAT is pt has developed SOB within 24 hours  1. How long have you been experiencing swelling? 3 days   2. Where is the swelling located? Both feet   3.  Are you currently taking a "fluid pill"? Furosemide  was increase   4.  Are you currently SOB? Off /on   5.  Have you traveled recently?  No

## 2015-11-07 NOTE — Telephone Encounter (Signed)
Reviewed with Dr. Tamala Julian. He recommends that the patient's wife give him a total of 40 mg of lasix x 1 dose tomorrow, then resume normal dosing. Per Dr. Tamala Julian, Dr. Fredderick Phenix comes out to where they live. I have notified Mrs. Velazco of Dr. Thompson Caul recommendations and she verbalizes understanding.  I inquired about Dr. Fredderick Phenix coming to see the patient. She states Dr. Fredderick Phenix was there Tuesday and comes when she calls him. I advised if she is still concerned come Monday, that she put a call in to Dr. Fredderick Phenix to have the patient evaluated. She has reported to me that the patient is very agitated by the ER and will not go there. She will call Dr. Fredderick Phenix on Monday if needed.

## 2015-11-12 ENCOUNTER — Encounter (HOSPITAL_COMMUNITY): Payer: Self-pay | Admitting: Emergency Medicine

## 2015-11-12 ENCOUNTER — Inpatient Hospital Stay (HOSPITAL_COMMUNITY)
Admission: EM | Admit: 2015-11-12 | Discharge: 2015-11-15 | DRG: 291 | Disposition: A | Discharge status: Home Health | Payer: Medicare Other | Attending: Internal Medicine | Admitting: Internal Medicine

## 2015-11-12 ENCOUNTER — Inpatient Hospital Stay (HOSPITAL_COMMUNITY): Payer: Medicare Other

## 2015-11-12 ENCOUNTER — Emergency Department (HOSPITAL_COMMUNITY): Payer: Medicare Other

## 2015-11-12 ENCOUNTER — Emergency Department (HOSPITAL_COMMUNITY)
Admit: 2015-11-12 | Discharge: 2015-11-12 | Disposition: A | Discharge status: Home / Self Care | Payer: Medicare Other | Attending: Emergency Medicine | Admitting: Emergency Medicine

## 2015-11-12 ENCOUNTER — Emergency Department (HOSPITAL_COMMUNITY): Admit: 2015-11-12 | Payer: Medicare Other

## 2015-11-12 DIAGNOSIS — Z7901 Long term (current) use of anticoagulants: Secondary | ICD-10-CM | POA: Diagnosis not present

## 2015-11-12 DIAGNOSIS — I130 Hypertensive heart and chronic kidney disease with heart failure and stage 1 through stage 4 chronic kidney disease, or unspecified chronic kidney disease: Principal | ICD-10-CM | POA: Diagnosis present

## 2015-11-12 DIAGNOSIS — N184 Chronic kidney disease, stage 4 (severe): Secondary | ICD-10-CM | POA: Diagnosis present

## 2015-11-12 DIAGNOSIS — I480 Paroxysmal atrial fibrillation: Secondary | ICD-10-CM | POA: Diagnosis present

## 2015-11-12 DIAGNOSIS — Z79899 Other long term (current) drug therapy: Secondary | ICD-10-CM | POA: Diagnosis not present

## 2015-11-12 DIAGNOSIS — Z888 Allergy status to other drugs, medicaments and biological substances status: Secondary | ICD-10-CM

## 2015-11-12 DIAGNOSIS — Z951 Presence of aortocoronary bypass graft: Secondary | ICD-10-CM | POA: Diagnosis not present

## 2015-11-12 DIAGNOSIS — N189 Chronic kidney disease, unspecified: Secondary | ICD-10-CM | POA: Diagnosis not present

## 2015-11-12 DIAGNOSIS — D509 Iron deficiency anemia, unspecified: Secondary | ICD-10-CM | POA: Diagnosis present

## 2015-11-12 DIAGNOSIS — E1122 Type 2 diabetes mellitus with diabetic chronic kidney disease: Secondary | ICD-10-CM | POA: Diagnosis present

## 2015-11-12 DIAGNOSIS — I272 Other secondary pulmonary hypertension: Secondary | ICD-10-CM | POA: Diagnosis present

## 2015-11-12 DIAGNOSIS — I5023 Acute on chronic systolic (congestive) heart failure: Secondary | ICD-10-CM | POA: Diagnosis present

## 2015-11-12 DIAGNOSIS — I672 Cerebral atherosclerosis: Secondary | ICD-10-CM | POA: Diagnosis present

## 2015-11-12 DIAGNOSIS — M7989 Other specified soft tissue disorders: Secondary | ICD-10-CM

## 2015-11-12 DIAGNOSIS — I2510 Atherosclerotic heart disease of native coronary artery without angina pectoris: Secondary | ICD-10-CM | POA: Diagnosis present

## 2015-11-12 DIAGNOSIS — F0150 Vascular dementia without behavioral disturbance: Secondary | ICD-10-CM | POA: Diagnosis present

## 2015-11-12 DIAGNOSIS — Z8546 Personal history of malignant neoplasm of prostate: Secondary | ICD-10-CM

## 2015-11-12 DIAGNOSIS — I5020 Unspecified systolic (congestive) heart failure: Secondary | ICD-10-CM | POA: Diagnosis present

## 2015-11-12 DIAGNOSIS — F0390 Unspecified dementia without behavioral disturbance: Secondary | ICD-10-CM | POA: Diagnosis present

## 2015-11-12 DIAGNOSIS — E785 Hyperlipidemia, unspecified: Secondary | ICD-10-CM | POA: Diagnosis present

## 2015-11-12 DIAGNOSIS — Z886 Allergy status to analgesic agent status: Secondary | ICD-10-CM | POA: Diagnosis not present

## 2015-11-12 DIAGNOSIS — Z86718 Personal history of other venous thrombosis and embolism: Secondary | ICD-10-CM | POA: Diagnosis not present

## 2015-11-12 DIAGNOSIS — I442 Atrioventricular block, complete: Secondary | ICD-10-CM | POA: Diagnosis not present

## 2015-11-12 DIAGNOSIS — I5022 Chronic systolic (congestive) heart failure: Secondary | ICD-10-CM | POA: Diagnosis not present

## 2015-11-12 DIAGNOSIS — R42 Dizziness and giddiness: Secondary | ICD-10-CM | POA: Diagnosis present

## 2015-11-12 DIAGNOSIS — Z794 Long term (current) use of insulin: Secondary | ICD-10-CM | POA: Diagnosis not present

## 2015-11-12 DIAGNOSIS — F329 Major depressive disorder, single episode, unspecified: Secondary | ICD-10-CM | POA: Diagnosis present

## 2015-11-12 DIAGNOSIS — N179 Acute kidney failure, unspecified: Secondary | ICD-10-CM

## 2015-11-12 DIAGNOSIS — Z950 Presence of cardiac pacemaker: Secondary | ICD-10-CM | POA: Diagnosis not present

## 2015-11-12 HISTORY — DX: Sick sinus syndrome: I49.5

## 2015-11-12 HISTORY — DX: Malignant neoplasm of prostate: C61

## 2015-11-12 HISTORY — DX: Chronic systolic (congestive) heart failure: I50.22

## 2015-11-12 LAB — COMPREHENSIVE METABOLIC PANEL
ALBUMIN: 3.3 g/dL — AB (ref 3.5–5.0)
ALT: 62 U/L (ref 17–63)
ANION GAP: 12 (ref 5–15)
AST: 34 U/L (ref 15–41)
Alkaline Phosphatase: 113 U/L (ref 38–126)
BUN: 47 mg/dL — ABNORMAL HIGH (ref 6–20)
CHLORIDE: 107 mmol/L (ref 101–111)
CO2: 23 mmol/L (ref 22–32)
Calcium: 9.4 mg/dL (ref 8.9–10.3)
Creatinine, Ser: 3.13 mg/dL — ABNORMAL HIGH (ref 0.61–1.24)
GFR calc non Af Amer: 16 mL/min — ABNORMAL LOW (ref >60)
GFR, EST AFRICAN AMERICAN: 19 mL/min — AB (ref >60)
Glucose, Bld: 212 mg/dL — ABNORMAL HIGH (ref 65–99)
Potassium: 3.7 mmol/L (ref 3.5–5.1)
SODIUM: 142 mmol/L (ref 135–145)
Total Bilirubin: 0.6 mg/dL (ref 0.3–1.2)
Total Protein: 6.2 g/dL — ABNORMAL LOW (ref 6.5–8.1)

## 2015-11-12 LAB — MAGNESIUM: Magnesium: 2 mg/dL (ref 1.7–2.4)

## 2015-11-12 LAB — URINALYSIS, ROUTINE W REFLEX MICROSCOPIC
BILIRUBIN URINE: NEGATIVE
Glucose, UA: NEGATIVE mg/dL
Hgb urine dipstick: NEGATIVE
Ketones, ur: NEGATIVE mg/dL
LEUKOCYTES UA: NEGATIVE
NITRITE: NEGATIVE
PH: 6 (ref 5.0–8.0)
Protein, ur: NEGATIVE mg/dL
SPECIFIC GRAVITY, URINE: 1.012 (ref 1.005–1.030)

## 2015-11-12 LAB — CBC WITH DIFFERENTIAL/PLATELET
BASOS PCT: 0 %
Basophils Absolute: 0 10*3/uL (ref 0.0–0.1)
EOS ABS: 0.1 10*3/uL (ref 0.0–0.7)
EOS PCT: 2 %
HCT: 29.7 % — ABNORMAL LOW (ref 39.0–52.0)
Hemoglobin: 10.2 g/dL — ABNORMAL LOW (ref 13.0–17.0)
LYMPHS ABS: 0.7 10*3/uL (ref 0.7–4.0)
Lymphocytes Relative: 18 %
MCH: 30.7 pg (ref 26.0–34.0)
MCHC: 34.3 g/dL (ref 30.0–36.0)
MCV: 89.5 fL (ref 78.0–100.0)
Monocytes Absolute: 0.4 10*3/uL (ref 0.1–1.0)
Monocytes Relative: 9 %
Neutro Abs: 2.8 10*3/uL (ref 1.7–7.7)
Neutrophils Relative %: 71 %
PLATELETS: 153 10*3/uL (ref 150–400)
RBC: 3.32 MIL/uL — AB (ref 4.22–5.81)
RDW: 15.7 % — ABNORMAL HIGH (ref 11.5–15.5)
WBC: 3.9 10*3/uL — AB (ref 4.0–10.5)

## 2015-11-12 LAB — BRAIN NATRIURETIC PEPTIDE: B NATRIURETIC PEPTIDE 5: 3358.8 pg/mL — AB (ref 0.0–100.0)

## 2015-11-12 LAB — GLUCOSE, CAPILLARY
Glucose-Capillary: 194 mg/dL — ABNORMAL HIGH (ref 65–99)
Glucose-Capillary: 170 mg/dL — ABNORMAL HIGH (ref 65–99)

## 2015-11-12 LAB — PHOSPHORUS: PHOSPHORUS: 4 mg/dL (ref 2.5–4.6)

## 2015-11-12 LAB — PROTIME-INR
INR: 2.16 — ABNORMAL HIGH (ref 0.00–1.49)
Prothrombin Time: 23.9 seconds — ABNORMAL HIGH (ref 11.6–15.2)

## 2015-11-12 LAB — TROPONIN I: TROPONIN I: 0.04 ng/mL — AB (ref <0.031)

## 2015-11-12 LAB — POC OCCULT BLOOD, ED: Fecal Occult Bld: NEGATIVE

## 2015-11-12 MED ORDER — INSULIN ASPART 100 UNIT/ML ~~LOC~~ SOLN: 0.0000 [IU] | Freq: Every day | SUBCUTANEOUS | Status: DC

## 2015-11-12 MED ORDER — WARFARIN - PHARMACIST DOSING INPATIENT
Freq: Every day | Status: DC
  Administered 2015-11-12 – 2015-11-14 (×2): 1

## 2015-11-12 MED ORDER — DIPHENHYDRAMINE HCL 25 MG PO CAPS
25.0000 mg | ORAL_CAPSULE | Freq: Once | ORAL | Status: AC
  Administered 2015-11-12: 25 mg via ORAL
  Filled 2015-11-12: qty 1

## 2015-11-12 MED ORDER — DONEPEZIL HCL 5 MG PO TABS
5.0000 mg | ORAL_TABLET | Freq: Every day | ORAL | Status: DC
  Administered 2015-11-12 – 2015-11-14 (×3): 5 mg via ORAL
  Filled 2015-11-12 (×3): qty 1

## 2015-11-12 MED ORDER — INSULIN DETEMIR 100 UNIT/ML ~~LOC~~ SOLN
10.0000 [IU] | Freq: Every day | SUBCUTANEOUS | Status: DC
  Administered 2015-11-12 – 2015-11-14 (×3): 10 [IU] via SUBCUTANEOUS
  Filled 2015-11-12 (×4): qty 0.1

## 2015-11-12 MED ORDER — INSULIN ASPART 100 UNIT/ML ~~LOC~~ SOLN
0.0000 [IU] | Freq: Three times a day (TID) | SUBCUTANEOUS | Status: DC
  Administered 2015-11-13: 2 [IU] via SUBCUTANEOUS
  Administered 2015-11-13 – 2015-11-14 (×2): 1 [IU] via SUBCUTANEOUS
  Administered 2015-11-14: 2 [IU] via SUBCUTANEOUS

## 2015-11-12 MED ORDER — SENNA 8.6 MG PO TABS: 2.0000 | ORAL_TABLET | Freq: Every day | ORAL | Status: DC | PRN

## 2015-11-12 MED ORDER — FUROSEMIDE 10 MG/ML IJ SOLN
40.0000 mg | Freq: Two times a day (BID) | INTRAMUSCULAR | Status: DC
  Administered 2015-11-12 – 2015-11-13 (×2): 40 mg via INTRAVENOUS
  Filled 2015-11-12 (×2): qty 4

## 2015-11-12 MED ORDER — SODIUM CHLORIDE 0.9% FLUSH
3.0000 mL | Freq: Two times a day (BID) | INTRAVENOUS | Status: DC
  Administered 2015-11-12 – 2015-11-15 (×6): 3 mL via INTRAVENOUS

## 2015-11-12 MED ORDER — AMLODIPINE BESYLATE 10 MG PO TABS
10.0000 mg | ORAL_TABLET | Freq: Every day | ORAL | Status: DC
  Administered 2015-11-13 – 2015-11-15 (×3): 10 mg via ORAL
  Filled 2015-11-12 (×3): qty 1

## 2015-11-12 MED ORDER — CALCIUM CARBONATE-VITAMIN D 500-200 MG-UNIT PO TABS
1.0000 | ORAL_TABLET | Freq: Every day | ORAL | Status: DC
  Administered 2015-11-13 – 2015-11-14 (×2): 1 via ORAL
  Filled 2015-11-12 (×2): qty 1

## 2015-11-12 MED ORDER — FUROSEMIDE 10 MG/ML IJ SOLN
40.0000 mg | Freq: Once | INTRAMUSCULAR | Status: AC
  Administered 2015-11-12: 40 mg via INTRAVENOUS
  Filled 2015-11-12: qty 4

## 2015-11-12 MED ORDER — WARFARIN SODIUM 2 MG PO TABS
4.0000 mg | ORAL_TABLET | Freq: Once | ORAL | Status: AC
  Administered 2015-11-12: 4 mg via ORAL
  Filled 2015-11-12: qty 2

## 2015-11-12 MED ORDER — ATENOLOL 50 MG PO TABS
50.0000 mg | ORAL_TABLET | Freq: Two times a day (BID) | ORAL | Status: DC
Start: 2015-11-12 — End: 2015-11-15
  Administered 2015-11-12 – 2015-11-15 (×6): 50 mg via ORAL
  Filled 2015-11-12 (×6): qty 1

## 2015-11-12 MED ORDER — CALCIUM 600-400 MG-UNIT PO CHEW: CHEWABLE_TABLET | Freq: Every day | ORAL | Status: DC

## 2015-11-12 MED ORDER — GLUCERNA SHAKE PO LIQD
237.0000 mL | Freq: Three times a day (TID) | ORAL | Status: DC
  Administered 2015-11-12 – 2015-11-14 (×6): 237 mL via ORAL

## 2015-11-12 NOTE — ED Provider Notes (Signed)
CSN: [id number]     Arrival date & time 11/12/15  1027 History   First MD Initiated Contact with Patient 11/12/15 1034     Chief Complaint  Patient presents with  . Weakness     (Consider location/radiation/quality/duration/timing/severity/associated sxs/prior Treatment) The history is provided by the patient and the spouse. History limited by: dementia.    Past Medical History  Diagnosis Date  . Hypertension   . Diabetes mellitus   . Pacemaker   . Left leg DVT ([place])   . CKD (chronic kidney disease)   . Depression   . HTN (hypertension)   . Gait abnormality   . CAD (coronary artery disease)     with prior CABG 1991 and 2006 LIMA LAD, seq. SVG D1-Cfx, SVG d2,and seq. SVG AM-PDA Cardiology Dr. Tamala Julian  . Dementia   . Cancer [facility])     prostate   Past Surgical History  Procedure Laterality Date  . Prostatectomy    . Coronary artery bypass graft     Family History  Problem Relation Age of Onset  . Diabetes Mother   . Heart disease Sister   . Heart disease Sister   . Heart disease Sister   . Heart disease Sister    Social History  Substance Use Topics  . Smoking status: Never Smoker   . Smokeless tobacco: Never Used  . Alcohol Use: No    Review of Systems  Constitutional: Positive for appetite change and fatigue. Negative for fever.  HENT: Negative for sore throat.   Eyes: Negative for visual disturbance.  Respiratory: Positive for shortness of breath (pt denies, however wife reports she has noticed this worsening with exertion over the last week).   Cardiovascular: Negative for chest pain.  Gastrointestinal: Negative for nausea, vomiting and abdominal pain.  Genitourinary: Negative for difficulty urinating.  Musculoskeletal: Negative for back pain and neck stiffness.  Skin: Negative for rash.  Neurological: Positive for weakness (generalized) and light-headedness. Negative for dizziness, syncope, facial asymmetry, speech difficulty, numbness and headaches.       Allergies  Iodinated diagnostic agents; Iohexol; Ace inhibitors; Simvastatin; and Aspirin  Home Medications   Prior to Admission medications   Medication Sig Start Date End Date Taking? Authorizing Provider  amLODipine (NORVASC) 10 MG tablet Take 10 mg by mouth daily. 12/31/14  Yes Historical Provider, MD  atenolol (TENORMIN) 50 MG tablet Take 50 mg by mouth 2 (two) times daily.    Yes Historical Provider, MD  buPROPion (WELLBUTRIN) 100 MG tablet Take 1 tablet by mouth daily. 12/09/14  Yes Historical Provider, MD  CALCIUM PO Take 1 tablet by mouth daily.   Yes Historical Provider, MD  Coenzyme Q10 (COQ10 PO) Take 1 capsule by mouth daily.   Yes Historical Provider, MD  donepezil (ARICEPT) 5 MG tablet Take 5 mg by mouth at bedtime.   Yes Historical Provider, MD  feeding supplement, GLUCERNA SHAKE, (GLUCERNA SHAKE) LIQD Take 237 mLs by mouth 3 (three) times daily between meals. Patient taking differently: Take 237 mLs by mouth 2 (two) times daily as needed.  07/07/15  Yes Iline Oven, MD  furosemide (LASIX) 20 MG tablet Take 10 mg by mouth daily. 01/29/15  Yes Historical Provider, MD  Insulin Detemir (LEVEMIR FLEXPEN) 100 UNIT/ML Pen Inject 16 Units into the skin daily as needed (over 200).    Yes Historical Provider, MD  linagliptin (TRADJENTA) 5 MG TABS tablet Take 7.5 mg by mouth daily.   Yes Historical Provider, MD  Multiple Vitamins-Minerals (ICAPS  PO) Take 1 tablet by mouth 2 (two) times daily.   Yes Historical Provider, MD  olmesartan (BENICAR) 40 MG tablet Take 40 mg by mouth daily.    Yes Historical Provider, MD  ondansetron (ZOFRAN) 4 MG tablet Take 1 tablet (4 mg total) by mouth every 6 (six) hours as needed for nausea or vomiting. 07/06/15  Yes Iline Oven, MD  polyethylene glycol [facility] / GLYCOLAX) packet Take 17 g by mouth every other day.    Yes Historical Provider, MD  Probiotic Product (ALIGN PO) Take 1 capsule by mouth daily.   Yes Historical Provider, MD   SENNA PO Take 1 tablet by mouth daily.   Yes Historical Provider, MD  sertraline (ZOLOFT) 50 MG tablet Take 50 mg by mouth at bedtime.    Yes Historical Provider, MD  warfarin (COUMADIN) 4 MG tablet Take 4-8 mg by mouth daily. 4mg  daily except 8mg  on  Saturday   Yes Historical Provider, MD  Glucose Blood (FREESTYLE LITE TEST VI) USE AS DIRECTED 06/02/13   Historical Provider, MD   BP 146/100 mmHg  Pulse 63  Temp(Src) 97.9 F (36.6 C) (Oral)  Resp 18  Ht 5\' 10"  (1.778 m)  Wt 147 lb 14.4 oz (67.087 kg)  BMI 21.22 kg/m2  SpO2 100% Physical Exam  Constitutional: He appears well-developed and well-nourished. No distress.  HENT:  Head: Normocephalic and atraumatic.  Eyes: Conjunctivae and EOM are normal.  Neck: Normal range of motion. JVD (with sitting at 80 degrees) present.  Cardiovascular: Normal rate, regular rhythm and intact distal pulses.  Exam reveals no gallop and no friction rub.   Murmur heard. Pulmonary/Chest: Effort normal. No respiratory distress. He has no wheezes. He has rales (faint bibasilar).  Abdominal: Soft. He exhibits no distension. There is no tenderness. There is no guarding.  Musculoskeletal: He exhibits edema (2+ bilaterally, more on right than left).  Neurological: He is alert. He has normal strength. No cranial nerve deficit or sensory deficit. Coordination normal. GCS eye subscore is 4. GCS verbal subscore is 5. GCS motor subscore is 6.  When standing, is unsteady, able to close eyes however appears that he may fall, ambulation deferred given pt uses walker, unsteady at baseline per family  Skin: Skin is warm and dry. No rash noted. He is not diaphoretic.  Nursing note and vitals reviewed.   ED Course  Procedures (including critical care time) Labs Review Labs Reviewed  CBC WITH DIFFERENTIAL/PLATELET - Abnormal; Notable for the following:    WBC 3.9 (*)    RBC 3.32 (*)    Hemoglobin 10.2 (*)    HCT 29.7 (*)    RDW 15.7 (*)    All other components  within normal limits  COMPREHENSIVE METABOLIC PANEL - Abnormal; Notable for the following:    Glucose, Bld 212 (*)    BUN 47 (*)    Creatinine, Ser 3.13 (*)    Total Protein 6.2 (*)    Albumin 3.3 (*)    GFR calc non Af Amer 16 (*)    GFR calc Af Amer 19 (*)    All other components within normal limits  BRAIN NATRIURETIC PEPTIDE - Abnormal; Notable for the following:    B Natriuretic Peptide 3358.8 (*)    All other components within normal limits  TROPONIN I - Abnormal; Notable for the following:    Troponin I 0.04 (*)    All other components within normal limits  PROTIME-INR - Abnormal; Notable for the following:  Prothrombin Time 23.9 (*)    INR 2.16 (*)    All other components within normal limits  GLUCOSE, CAPILLARY - Abnormal; Notable for the following:    Glucose-Capillary 194 (*)    All other components within normal limits  URINE CULTURE  MAGNESIUM  PHOSPHORUS  URINALYSIS, ROUTINE W REFLEX MICROSCOPIC (NOT AT [facility])  HEMOGLOBIN A1C  FERRITIN  PROTIME-INR  CBC  I-STAT TROPOININ, ED  POC OCCULT BLOOD, ED    Imaging Review Dg Chest 2 View  11/12/2015  CLINICAL DATA:  Generalized weakness beginning earlier today. EXAM: CHEST  2 VIEW COMPARISON:  12/29/2012. FINDINGS: Cardiomegaly. Previous CABG. Dual lead pacer. BILATERAL effusions, lower lobe opacity, interstitial prominence consistent with early CHF. Worsening aeration from priors. No pneumothorax. IMPRESSION: Early CHF. Electronically Signed   By: Staci Righter M.D.   On: 11/12/2015 12:15   Ct Head Wo Contrast  11/12/2015  CLINICAL DATA:  82 year old male with multiple falls, generalized weakness. Initial encounter. EXAM: CT HEAD WITHOUT CONTRAST TECHNIQUE: Contiguous axial images were obtained from the base of the skull through the vertex without intravenous contrast. COMPARISON:  Head CT without contrast 09/14/2011. FINDINGS: Mild sphenoid and ethmoid sinus mucosal thickening is new. Other Visualized paranasal  sinuses and mastoids are clear. No acute scalp or orbits soft tissue findings. No acute osseous abnormality identified. Calcified atherosclerosis at the skull base. Stable cerebral volume. Patchy and confluent bilateral white matter hypodensity appears stable. No midline shift, mass effect, or evidence of intracranial mass lesion. Stable ventricle size and configuration. No acute intracranial hemorrhage identified. No cortically based acute infarct identified. Chronic linear right cerebellar are lacunar infarct, and probable bilateral thalamic lacunar infarcts are stable. No suspicious intracranial vascular hyperdensity. IMPRESSION: 1. Stable appearance of chronic small vessel disease since 2013. No acute intracranial abnormality. 2. New mild sphenoid and ethmoid sinus inflammation. Electronically Signed   By: Genevie Ann M.D.   On: 11/12/2015 15:14   I have personally reviewed and evaluated these images and lab results as part of my medical decision-making.   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 Family reports increased swelling in his bilateral lower extremities over last week, and that patient had increase in Lasix dose 1 week ago, however leg swelling has not decreased.  Patient  and family deny any focal neurologic deficits, including denying vertigo, ataxia, or other focal deficits. His neurologic exam is within normal limits, with the exception of patient being unsteady on his feet, which is unchanged from baseline. Have low suspicion that symptoms represent CVA.  Patient with signs of volume overload on exam including JVD, bilateral lower extremity edema (right greater than left) basilar crackles. BNP returned at over 3000.  Pt given 40 mg  IV lasix. Labs also consistent with acute on chronic kidney disease, which may be secondary to worsening CHF.  DVT US of RLE shows no DVT. Given falls, CT head done which was negative.  Urinalysis without signs of infection.    Pt likely with worsening generalized weakness, dyspnea, and leg swelling from CHF. Will admit to medicine for further care.      Gareth Morgan, MD 11/12/15 2244

## 2015-11-12 NOTE — H&P (Signed)
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 [name], MD    First Contact: Wille Celeste Pager: [phone]  Second Contact: Dr. 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 History was obtained from both the patient and his wife. He reports feeling dizzy this morning and "not feeling well" in general. His wife notes he has had increased lower extremity swelling eve despite seeing his primary care doctor and having increased dose of his Lasix. She reports his Lasix was increased from 20 mg daily to 40 mg daily. She also reports he has been having some shortness of breath. He denies orthopnea, PND, abdominal swelling, chest pain, cough, hemoptysis, headaches, changes in bowel or bladder function, changes in appetite (although wife endorses he has had a decreased appetite for the past 6-10 months), and changes in diet. He endorses heart palpitations but cannot characterize how different these are from his baseline. His wife added that he had a glucose of 399 last night and she gave him 10 units of Levemir and this morning his glucose was in the low 200's. This is abnormal for him because lately she has not had to even give him any insulin because his glucose has been in the low 100s. His wife also reports that he often is dehydrated, not taking in enough fluids.  In the ED, his CXR showed interstitial infiltrates and bilateral pulmonary effusions. A  CT head showed unchanged subcortical white matter vascular disease, and ethmoid and sphenoid sinus inflammation. He was given 40 mg IV Lasix.   Meds: No current facility-administered medications for this encounter.   Current Outpatient Prescriptions  Medication Sig Dispense Refill  . amLODipine (NORVASC) 10 MG tablet Take 10 mg by mouth daily.  4  . atenolol (TENORMIN) 50 MG tablet Take 50 mg by mouth 2 (two) times daily.     Marland Kitchen buPROPion (WELLBUTRIN) 100 MG tablet Take 1 tablet by mouth daily.  2  . CALCIUM PO Take 1 tablet by mouth daily.    . Coenzyme Q10 (COQ10 PO) Take 1 capsule by mouth daily.    Marland Kitchen donepezil (ARICEPT) 5 MG tablet Take 5 mg by mouth at bedtime.    . feeding supplement, GLUCERNA SHAKE, (GLUCERNA SHAKE) LIQD Take 237 mLs by mouth 3 (three) times daily between meals. (Patient taking differently: Take 237 mLs by mouth 2 (two) times daily as needed. )  0  . furosemide (LASIX) 20 MG tablet Take 10 mg by mouth daily.  5  . Insulin Detemir (LEVEMIR FLEXPEN) 100 UNIT/ML Pen Inject 16 Units into the skin daily as needed (over 200).     Marland Kitchen linagliptin (TRADJENTA) 5 MG TABS tablet Take 7.5 mg by mouth daily.    . Multiple Vitamins-Minerals (ICAPS PO) Take 1 tablet by mouth 2 (two) times daily.    Marland Kitchen  olmesartan (BENICAR) 40 MG tablet Take 40 mg by mouth daily.     . ondansetron (ZOFRAN) 4 MG tablet Take 1 tablet (4 mg total) by mouth every 6 (six) hours as needed for nausea or vomiting. 20 tablet 0  . polyethylene glycol (MIRALAX / GLYCOLAX) packet Take 17 g by mouth every other day.     . Probiotic Product (ALIGN PO) Take 1 capsule by mouth daily.    . SENNA PO Take 1 tablet by mouth daily.    . sertraline (ZOLOFT) 50 MG tablet Take 50 mg by mouth at bedtime.     Marland Kitchen warfarin (COUMADIN) 4 MG tablet Take 4-8 mg by mouth daily. 4mg  daily except 8mg  on  Saturday    . Glucose Blood (FREESTYLE LITE TEST VI) USE AS DIRECTED      Allergies: Allergies as of 11/12/2015 - Review Complete  11/12/2015  Allergen Reaction Noted  . Iodinated diagnostic agents Anaphylaxis 06/06/2014  . Iohexol Anaphylaxis 09/23/2009  . Ace inhibitors Other (See Comments) 06/06/2014  . Simvastatin Other (See Comments) 06/06/2014  . Aspirin  04/30/2015   Past Medical History  Diagnosis Date  . Hypertension   . Diabetes mellitus   . Pacemaker   . Left leg DVT ([place])   . CKD (chronic kidney disease)   . Depression   . HTN (hypertension)   . Gait abnormality   . CAD (coronary artery disease)     with prior CABG 1991 and 2006 LIMA LAD, seq. SVG D1-Cfx, SVG d2,and seq. SVG AM-PDA Cardiology Dr. Tamala Julian  . Dementia   . Cancer [facility])     prostate   Past Surgical History  Procedure Laterality Date  . Prostatectomy    . Coronary artery bypass graft     Family History  Problem Relation Age of Onset  . Diabetes Mother   . Heart disease Sister   . Heart disease Sister   . Heart disease Sister   . Heart disease Sister    Social History   Social History  . Marital Status: Married    Spouse Name: N/A  . Number of Children: N/A  . Years of Education: N/A   Occupational History  . Not on file.   Social History Main Topics  . Smoking status: Never Smoker   . Smokeless tobacco: Never Used  . Alcohol Use: No  . Drug Use: No  . Sexual Activity: Not on file   Other Topics Concern  . Not on file   Social History Narrative    Review of Systems: General: Denies fever, chills, night sweats HEENT: Denies ear pain, changes in vision, rhinorrhea, sore throat CV: See HPI Pulm: See HPI GI: Denies abdominal pain, nausea, vomiting, diarrhea, constipation, melena, hematochezia GU: Denies dysuria, hematuria, frequency Msk: Denies muscle cramps, joint pains Neuro: Denies weakness, numbness, tingling Skin: Denies rashes, bruising Psych: Denies depression, anxiety, hallucinations  Physical Exam: Blood pressure 147/81, pulse 69, temperature 98.3 F (36.8 C), temperature source Oral, resp. rate  20, SpO2 100 %. General: elderly man who appears younger than stated age, sitting up in bed, NAD, pleasant HEENT: [place]/AT, EOMI, sclera anicteric, mucus membranes moist Neck: supple, no JVD appreciated CV: RRR, no m/g/r Pulm: bilateral crackles at the bases with R>L, breaths non-labored on room air Abd: BS+, soft, non-tender Ext: warm, 1+ pitting edema in the RLE, trace edema in the LLE. RLE appears slightly larger than left.  Neuro: alert and oriented x 3  Lab results: Basic Metabolic Panel:  Recent  Labs  11/12/15 1103  NA 142  K 3.7  CL 107  CO2 23  GLUCOSE 212*  BUN 47*  CREATININE 3.13*  CALCIUM 9.4  MG 2.0  PHOS 4.0   Liver Function Tests:  Recent Labs  11/12/15 1103  AST 34  ALT 62  ALKPHOS 113  BILITOT 0.6  PROT 6.2*  ALBUMIN 3.3*   CBC:  Recent Labs  11/12/15 1103  WBC 3.9*  NEUTROABS 2.8  HGB 10.2*  HCT 29.7*  MCV 89.5  PLT 153   Cardiac Enzymes:  Recent Labs  11/12/15 1103  TROPONINI 0.04*   Coagulation:  Recent Labs  11/12/15 1103  LABPROT 23.9*  INR 2.16*   Urinalysis:  Recent Labs  11/12/15 1106  COLORURINE YELLOW  LABSPEC 1.012  PHURINE 6.0  GLUCOSEU NEGATIVE  HGBUR NEGATIVE  BILIRUBINUR NEGATIVE  KETONESUR NEGATIVE  PROTEINUR NEGATIVE  NITRITE NEGATIVE  LEUKOCYTESUR NEGATIVE    Imaging results:  Dg Chest 2 View  11/12/2015  CLINICAL DATA:  Generalized weakness beginning earlier today. EXAM: CHEST  2 VIEW COMPARISON:  12/29/2012. FINDINGS: Cardiomegaly. Previous CABG. Dual lead pacer. BILATERAL effusions, lower lobe opacity, interstitial prominence consistent with early CHF. Worsening aeration from priors. No pneumothorax. IMPRESSION: Early CHF. Electronically Signed   By: Staci Righter M.D.   On: 11/12/2015 12:15    Other results: EKG: accelerated junctional rhythm  Assessment & Plan by Problem:  Mild CHF exaccerbation: Patient presented with increasing SOB, LE edema, generalized weakness, and dizziness while  standing. He has evidence of volume overload on exam with bilateral LE edema and crackles at his lung bases. Additionally, he was found to have an elevated BNP of 3300 and CXR with pulmonary edema and bilateral pleural effusions all consistent with an acute CHF exacerbation. Last echo was done in 11/2014 and showed LV EF of 35-40%, with severe pulmonary HTN (PA pressure 69 mmHg).His increased Cr of 3.1 from his baseline of ~2.3 (December 2016) could represent acute cardiorenal syndrome, or worsening CKD. His BUN:Cr is currently <20, but this is not the ideal measure to differentiate pre-renal from intra-renal causes of AKI. We could consider checking a urine BUN and creatinine to calculate a FeUrea (given his use of diuretics) to help [place] further delineate the etiology of his AKI and whether it is likely to be from this CHF exacerbation vs worsening renal disease.Plan is to diurese him to see if this helps his volume overload as well as his kidney function. - Start Lasix 40 mg IV BID - bmet in AM to check Cr - Continue Atenolol - Hold home Olmesartan  AKI on CKD Stage IV: As discussed above, patient presents with increased creatine of 3.1 from a baseline of ~2.3 in December 2016. Could represent cardiorenal syndrome or worsening intrinsic kidney disease. Previous hospitalization also was significant for AKI that resolved with fluids. However, at that time there were no overt signs of volume overload as there are now. Given his slight hypervolemia, we will proceed carefully with gentle diuresis, while monitoring for worsening kidney function.  - Gentle diuresis as outlined above - Hold olmesartan given AKI - Monitor for worsening kidney function with daily BMP  Type II DM: Last A1c 7.3 per care everywhere. Per wife, he has not consistently been receiving insulin.   - Hold linagliptin - Decrease home Levemir to 10 units QHS  - Start sensitive ISS - CBGs with meals and QHS  HTN: BP 137/84.  Currently well controlled on home medications.  -  Continue home atenolol, amlodipine  Vascular Dementia: CT head shows unchanged subcortical white matter vascular changes. Mental status seems to be at baseline according to wife. Could also be contributing to unsteadiness on feet that he experienced this morning if he has experienced slight worsening of his disease that was not evident on CT scan.  - Continue home donepezil - Assess for falls risk - PT/OT consulted  Normocytic Anemia: Hgb 10.2 on admission down from recent baseline of ~11.5. FOBT negative. Possible etiologies include Fe deficiency due to inadequate PO intake given his worsening appetite, or worsening kidney function. Could also be contributing to his shortness of breath and dizziness w/ standing, although this is is a less-likely cause.  - Check ferritin  - Repeat CBC in AM  Paroxysmal A-fib: Currently in sinus rhythm. He is on Atenolol and anticoagulated with coumadin at home. Currently therapeutic w/ INR of 2.5 - Continue Atenolol  - Continue warfarin - Check INR daily  Diet: Heart healthy VTE Ppx: Coumadin Dispo: Disposition is deferred at this time, awaiting improvement of current medical problems. Anticipated discharge in approximately 2-3 day(s).   The patient does have a current PCP [name], MD) and does need an [facility] hospital follow-up appointment after discharge.  The patient does not have transportation limitations that hinder transportation to clinic appointments.  Signed: Juliet Rude, MD 11/12/2015, 3:06 PM

## 2015-11-12 NOTE — ED Notes (Signed)
Patient having generalized weakness starting this morning; A&Ox2 (history of dementia), CHF. Wife states feet swelling more, no shortness of breath, denies pain and fevers.

## 2015-11-12 NOTE — Progress Notes (Signed)
*  PRELIMINARY RESULTS* Vascular Ultrasound Right lower extremity venous duplex has been completed.  Preliminary findings: No evidence of DVT or baker's cyst.   Landry Mellow, RDMS, RVT  11/12/2015, 1:19 PM

## 2015-11-12 NOTE — H&P (Signed)
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 [name], MD    First Contact: Wille Celeste, MS4 Pager: [phone]  Second Contact: Dr. Albin Felling Pager: [phone]            After Hours (After 5p/  First Contact Pager: [phone]  weekends / holidays): Second Contact Pager: [phone]   Chief Complaint: Dizziness when standing this morning  History of Present Illness: Mr. Satkowski is a pleasant 82 y.o. Man with a history of CAD s/p CABG in 1991 and 2006, HTN, stage IV chronic kidney disease, tachybrady syndrome amnaged w/ dual chamber pacemaker, paroxysmal a-fib treated with warfarin, and vascular dementia, who presents to the ED with a 5 day history of increasing lower extremity edema and dizziness that he experienced this morning when he stood up.  His wife accompanied him to the ED and provides contextual information to the history.  Mr. Kashat reports that this morning he stood up and got dizzy, causing him to have to sit back down.  He says he hasn't been feeling really great lately.  His wife adds that she thinks he has been short of breath more than usual, and he has had leg swelling that has been increasing lately.  He says this feeling is not normal for him.  He can normally walk to the bedroom to the kitchen without shortness of breath, but he does not get up and move around more so than normal.  He denies orthopnea, PND, belly swelling, chest pain, cough, hemoptysis, headaches, changes in bowel or bladder function, changes in appetite (although wife endorses he has had a decreased appetite for the past 6-10 months), and changes in diet.  He endorses heart palpitations but cannot characterize how different these are from his baseline.  His wife added that he had a glucose of 399  last night and she gave him 10 U of insulin detamir and this morning his glucose was in the low 200's.  This is abnormal for him because lately she has not had to even give him any insulin because his glucose has been in the low 100s. His wife also reports that he often is dehydrated. He denies that this has happened to him before.    In the ED a CXR showed interstitial infiltrates and bilateral pulmonary effusions.  A CT scan showed unchanged subcortical white matter vascular disease, and ethmoid and sphenoid sinus inflammation.  He was given 40 mg IV furosemide and admitted to the internal medicine teaching service for further work-up and treatment.    Meds: No current facility-administered medications for this encounter.   Current Outpatient Prescriptions  Medication Sig Dispense Refill  . amLODipine (NORVASC) 10 MG tablet Take 10 mg by mouth daily.  4  . atenolol (TENORMIN) 50 MG tablet Take 50 mg by mouth 2 (two) times daily.     Marland Kitchen buPROPion (WELLBUTRIN) 100 MG tablet Take 1 tablet by mouth daily.  2  . CALCIUM PO Take 1 tablet by mouth daily.    . Coenzyme Q10 (COQ10 PO) Take 1 capsule by mouth daily.    Marland Kitchen donepezil (  ARICEPT) 5 MG tablet Take 5 mg by mouth at bedtime.    . feeding supplement, GLUCERNA SHAKE, (GLUCERNA SHAKE) LIQD Take 237 mLs by mouth 3 (three) times daily between meals. (Patient taking differently: Take 237 mLs by mouth 2 (two) times daily as needed. )  0  . furosemide (LASIX) 20 MG tablet Take 10 mg by mouth daily.  5  . Insulin Detemir (LEVEMIR FLEXPEN) 100 UNIT/ML Pen Inject 16 Units into the skin daily as needed (over 200).     Marland Kitchen linagliptin (TRADJENTA) 5 MG TABS tablet Take 7.5 mg by mouth daily.    . Multiple Vitamins-Minerals (ICAPS PO) Take 1 tablet by mouth 2 (two) times daily.    Marland Kitchen olmesartan (BENICAR) 40 MG tablet Take 40 mg by mouth daily.     . ondansetron (ZOFRAN) 4 MG tablet Take 1 tablet (4 mg total) by mouth every 6 (six) hours as needed for nausea or  vomiting. 20 tablet 0  . polyethylene glycol (MIRALAX / GLYCOLAX) packet Take 17 g by mouth every other day.     . Probiotic Product (ALIGN PO) Take 1 capsule by mouth daily.    . SENNA PO Take 1 tablet by mouth daily.    . sertraline (ZOLOFT) 50 MG tablet Take 50 mg by mouth at bedtime.     Marland Kitchen warfarin (COUMADIN) 4 MG tablet Take 4-8 mg by mouth daily. 4mg  daily except 8mg  on  Saturday    . Glucose Blood (FREESTYLE LITE TEST VI) USE AS DIRECTED      Allergies: Allergies as of 11/12/2015 - Review Complete 11/12/2015  Allergen Reaction Noted  . Iodinated diagnostic agents Anaphylaxis 06/06/2014  . Iohexol Anaphylaxis 09/23/2009  . Ace inhibitors Other (See Comments) 06/06/2014  . Simvastatin Other (See Comments) 06/06/2014  . Aspirin  04/30/2015   Past Medical History  Diagnosis Date  . Hypertension   . Diabetes mellitus   . Pacemaker   . Left leg DVT ([place])   . CKD (chronic kidney disease)   . Depression   . HTN (hypertension)   . Gait abnormality   . CAD (coronary artery disease)     with prior CABG 1991 and 2006 LIMA LAD, seq. SVG D1-Cfx, SVG d2,and seq. SVG AM-PDA Cardiology Dr. Tamala Julian  . Dementia   . Cancer [facility])     prostate   Past Surgical History  Procedure Laterality Date  . Prostatectomy    . Coronary artery bypass graft     Family History  Problem Relation Age of Onset  . Diabetes Mother   . Heart disease Sister   . Heart disease Sister   . Heart disease Sister   . Heart disease Sister    Social History   Social History  . Marital Status: Married    Spouse Name: N/A  . Number of Children: N/A  . Years of Education: N/A   Occupational History  . Not on file.   Social History Main Topics  . Smoking status: Never Smoker   . Smokeless tobacco: Never Used  . Alcohol Use: No  . Drug Use: No  . Sexual Activity: Not on file   Other Topics Concern  . Not on file   Social History Narrative    Review of Systems: Constitutional: negative Ears, nose,  mouth, throat, and face: negative Respiratory: as per HPI Cardiovascular: as per HPI Gastrointestinal: negative Genitourinary:negative Musculoskeletal:negative Neurological: negative Behavioral/Psych: negative Endocrine: negative  Physical Exam: Blood pressure 159/81, pulse 65, temperature 98.3 F (36.8  C), temperature source Oral, resp. rate 21, SpO2 100 %. General appearance: alert, cooperative, no distress and slowed mentation Head: Normocephalic, without obvious abnormality, atraumatic Eyes: negative findings: lids and lashes normal and anicteric Neck: no adenopathy, no carotid bruit, no JVD, supple, symmetrical, trachea midline and thyroid not enlarged, symmetric, no tenderness/mass/nodules Lungs: basilar crackles louder on the L than R Heart: regular rate and rhythm Abdomen: soft, non-tender; bowel sounds normal; no masses,  no organomegaly Rectal: normal tone, no masses or tenderness, stool in rectal vault, no visible hematochezia Extremities: edema 1+ R LE to mid shin over tibia, trace edema on L LE Pulses: 2+ radial pulses, 1+ DP pulses bilaterally Neurologic: Mental status: alertness: alert, orientation: time, person, place, city, affect: normal  Lab results: Basic Metabolic Panel:  Recent Labs  11/12/15 1103  NA 142  K 3.7  CL 107  CO2 23  GLUCOSE 212*  BUN 47*  CREATININE 3.13*  CALCIUM 9.4  MG 2.0  PHOS 4.0   Liver Function Tests:  Recent Labs  11/12/15 1103  AST 34  ALT 62  ALKPHOS 113  BILITOT 0.6  PROT 6.2*  ALBUMIN 3.3*   CBC:  Recent Labs  11/12/15 1103  WBC 3.9*  NEUTROABS 2.8  HGB 10.2*  HCT 29.7*  MCV 89.5  PLT 153   Cardiac Enzymes:  Recent Labs  11/12/15 1103  TROPONINI 0.04*    Recent Labs  11/12/15 1103  LABPROT 23.9*  INR 2.16*   Urinalysis:  Recent Labs  11/12/15 1106  COLORURINE YELLOW  LABSPEC 1.012  PHURINE 6.0  GLUCOSEU NEGATIVE  HGBUR NEGATIVE  BILIRUBINUR NEGATIVE  KETONESUR NEGATIVE    PROTEINUR NEGATIVE  NITRITE NEGATIVE  LEUKOCYTESUR NEGATIVE   Misc. Labs: BNP: 3358 FOBT: Negative  Imaging results:  Dg Chest 2 View  11/12/2015  CLINICAL DATA:  Generalized weakness beginning earlier today. EXAM: CHEST  2 VIEW COMPARISON:  12/29/2012. FINDINGS: Cardiomegaly. Previous CABG. Dual lead pacer. BILATERAL effusions, lower lobe opacity, interstitial prominence consistent with early CHF. Worsening aeration from priors. No pneumothorax. IMPRESSION: Early CHF. Electronically Signed   By: Staci Righter M.D.   On: 11/12/2015 12:15   Ct Head Wo Contrast  11/12/2015  CLINICAL DATA:  82 year old male with multiple falls, generalized weakness. Initial encounter. EXAM: CT HEAD WITHOUT CONTRAST TECHNIQUE: Contiguous axial images were obtained from the base of the skull through the vertex without intravenous contrast. COMPARISON:  Head CT without contrast 09/14/2011. FINDINGS: Mild sphenoid and ethmoid sinus mucosal thickening is new. Other Visualized paranasal sinuses and mastoids are clear. No acute scalp or orbits soft tissue findings. No acute osseous abnormality identified. Calcified atherosclerosis at the skull base. Stable cerebral volume. Patchy and confluent bilateral white matter hypodensity appears stable. No midline shift, mass effect, or evidence of intracranial mass lesion. Stable ventricle size and configuration. No acute intracranial hemorrhage identified. No cortically based acute infarct identified. Chronic linear right cerebellar are lacunar infarct, and probable bilateral thalamic lacunar infarcts are stable. No suspicious intracranial vascular hyperdensity. IMPRESSION: 1. Stable appearance of chronic small vessel disease since 2013. No acute intracranial abnormality. 2. New mild sphenoid and ethmoid sinus inflammation. Electronically Signed   By: Genevie Ann M.D.   On: 11/12/2015 15:14    Other results: EKG: ischemic changes noted in inferior leads, accelerated  junctional rhythm,  left axis deviation.  Assessment & Plan by Problem: Principal Problem:   Acute exacerbation of CHF (congestive heart failure) (HCC) Active Problems:   Pacemaker   HTN (hypertension)  Hyperlipidemia   CAD (coronary artery disease)   DM (diabetes mellitus) ([place])   Dementia   Atrial fibrillation (HCC)   Chronic anticoagulation   Heart failure with reduced ejection fraction (HCC)   Acute on chronic renal failure [facility])  Mr Schieffer is a 82 y.o. Man with a history of CAD, CKD, T2DM, HTN, a-fib, and vascular dementia who presents with "dizziness", LE edema, shortness of breath, BNP of 3358, infiltrates on CXR w/ bilateral pulmonary effusions, elevated creatinine (3.1) worrisome for congestive heart failure.   Mild CHF exaccerbation: Patient presents with increasing shortness of breath, LE edema and worsening functional status manifested by dizziness while standing.  Has extensive history of coronary artery disease including CABG x2. Recently had furosemide dose increased to 40 mg from 20 mg. Last echo was done 11/2014 and showed LV EF of 35-40%, with severe pulmonary HTN (PA pressure 69 mmHg).  Physical exam is significant for 2+ pitting edema to mid shin on L leg, inspiratory crackles.  BNP elevated at 3358 (unknown baseline) and CXR shows mild pulmonary edema w/ bilateral effusions and cardiomegaly.  ECG shows left axis deviation and chronic ischemic chagnes w/ junctional rhythm.  This clinical picture is consistent with a mild CHF exacerbation.  His concomitent increased creatinine of 3.1 from his baseline of ~2.3 (December 2016) could represent acute cardiorenal syndrome, or worsening CKD.  His BUN:Cr is currently <20, but this is not the ideal measure to differentiate pre-renal from intra-renal causes of AKI. We could consider checking a urine BUN and creatinine to calculate a FeUrea (given his use of diuretics) to help [place] further delineate the etiology of his AKI and whether it is likely to be from  this CHF exacerbation or a more nefarious cause such as worsening renal disease.  Will consider further investigation if creatinine continues to rise. Given he mild hypervolemia and increased creatinine, we will gently diurese him and carefully follow improvement of symptoms to guide our management.  If his creatinine continues to increase we will discontinue lasix.   --Furosemide, 40 mg, oral TID --Transthoracic Echocardiogram  --Follow BMP daily to monitor worsening AKI  Stage IV CKD with superimposed AKI: As discussed above, patient presents with increased creatine of 3.1 from a baseline of ~2.3 in December 2016.  Could represent cardiorenal syndrome or worsening intrinsic kidney disease.  Previous hospitalization also was significant for AKI that resolved with fluids. However, at that time there were no overt signs of volume overload as there are now.  Given his slight hypervolemia, we will proceed carefully with gentle diuresis, while monitoring for worsening kidney function.  --Gentle diuresis as outlined above --Hold olmesartan given AKI --Monitor for worsening kidney function with daily BMP  Type II DM: Treated with DPP-4 inhibitor and long-acting insulin, although wife concedes that she has not been giving insulin since his finger-stick glucose has been well controlled on his oral medication.   --hold linagliptin --Insulin detemir 10 U/day --Sliding scale insulin  HTN:  Currently well controlled on home medications.   --continue home atenolol, amlodipine  Vascular Dementia: CT head shows unchanged subcortical white matter vascular changes. Mental status seems to be at baseline according to wife.  Could also be contributing to unsteadiness on feet that he experienced this morning if he has experienced slight worsening of his disease that was not evident on CT scan.    --Continue home donepezil --Assess for falls risk --Consider PT consult  Normocytic Anemia: Hb 10.2 on admission  down from recent baseline  of ~11.5.  FOBT was negative.  Possible etiologies include Fe deficiency due to inadequate PO intake given his worsening appetite, or worsening kidney function.  Could also be contributing to his shortness of breath and dizziness w/ standing, although this is is a less-likely cause.   --Check ferritin  --Does not meet criteria for RBC transfusion with CHF  Paroxysmal A-fib: Treated with warfarin.  Currently therapeutic w/ INR of 2.5 --continue warfarin --check INR daily --consult pharmacy for warfarin dosing  Diet:  Heart healthy diet DVT PPX:  Patient anticoagulated w/ warfarin  Dispo: Patient does not meet standards for discharge at this time.  Anticipate continued admission 1-2 days.     This is a Careers information officer Note.  The care of the patient was discussed with Dr. Arcelia Jew and the assessment and plan was formulated with their assistance.  Please see their note for official documentation of the patient encounter.   Signed: [place], Med Student 11/12/2015, 4:00 PM

## 2015-11-12 NOTE — ED Notes (Signed)
Patient transported to X-ray 

## 2015-11-12 NOTE — Progress Notes (Signed)
ANTICOAGULATION CONSULT NOTE - Initial Consult  Pharmacy Consult for Warfarin Indication: atrial fibrillation  Allergies  Allergen Reactions  . Iodinated Diagnostic Agents Anaphylaxis  . Iohexol Anaphylaxis     Desc: RN states pt stated he had anyphalactic shock reaction to contrast approx 10 years ago.   . Ace Inhibitors Other (See Comments)    UNKNOWN  . Simvastatin Other (See Comments)    Muscle aches  . Aspirin     Other reaction(s): Abdominal Pain    Patient Measurements:   Heparin Dosing Weight:   Vital Signs: Temp: 98.3 F (36.8 C) (04/12 1031) Temp Source: Oral (04/12 1031) BP: 148/70 mmHg (04/12 1545) Pulse Rate: 60 (04/12 1545)  Labs:  Recent Labs  11/12/15 1103  HGB 10.2*  HCT 29.7*  PLT 153  LABPROT 23.9*  INR 2.16*  CREATININE 3.13*  TROPONINI 0.04*    CrCl cannot be calculated (Unknown ideal weight.).   Medical History: Past Medical History  Diagnosis Date  . Hypertension   . Diabetes mellitus   . Pacemaker   . Left leg DVT ([place])   . CKD (chronic kidney disease)   . Depression   . HTN (hypertension)   . Gait abnormality   . CAD (coronary artery disease)     with prior CABG 1991 and 2006 LIMA LAD, seq. SVG D1-Cfx, SVG d2,and seq. SVG AM-PDA Cardiology Dr. Tamala Julian  . Dementia   . Cancer [facility])     prostate    Medications:  Prescriptions prior to admission  Medication Sig Dispense Refill Last Dose  . amLODipine (NORVASC) 10 MG tablet Take 10 mg by mouth daily.  4 11/12/2015 at Unknown time  . atenolol (TENORMIN) 50 MG tablet Take 50 mg by mouth 2 (two) times daily.    11/12/2015 at 0930  . buPROPion (WELLBUTRIN) 100 MG tablet Take 1 tablet by mouth daily.  2 11/12/2015 at Unknown time  . CALCIUM PO Take 1 tablet by mouth daily.   11/12/2015 at Unknown time  . Coenzyme Q10 (COQ10 PO) Take 1 capsule by mouth daily.   11/12/2015 at Unknown time  . donepezil (ARICEPT) 5 MG tablet Take 5 mg by mouth at bedtime.   11/11/2015 at Unknown time  .  feeding supplement, GLUCERNA SHAKE, (GLUCERNA SHAKE) LIQD Take 237 mLs by mouth 3 (three) times daily between meals. (Patient taking differently: Take 237 mLs by mouth 2 (two) times daily as needed. )  0 Past Week at Unknown time  . furosemide (LASIX) 20 MG tablet Take 10 mg by mouth daily.  5 11/12/2015 at Unknown time  . Insulin Detemir (LEVEMIR FLEXPEN) 100 UNIT/ML Pen Inject 16 Units into the skin daily as needed (over 200).    11/11/2015 at Unknown time  . linagliptin (TRADJENTA) 5 MG TABS tablet Take 7.5 mg by mouth daily.   11/12/2015 at Unknown time  . Multiple Vitamins-Minerals (ICAPS PO) Take 1 tablet by mouth 2 (two) times daily.   11/11/2015 at Unknown time  . olmesartan (BENICAR) 40 MG tablet Take 40 mg by mouth daily.    11/11/2015 at Unknown time  . ondansetron (ZOFRAN) 4 MG tablet Take 1 tablet (4 mg total) by mouth every 6 (six) hours as needed for nausea or vomiting. 20 tablet 0 Past Month at Unknown time  . polyethylene glycol (MIRALAX / GLYCOLAX) packet Take 17 g by mouth every other day.    Past Week at Unknown time  . Probiotic Product (ALIGN PO) Take 1 capsule by mouth daily.  11/11/2015 at Unknown time  . SENNA PO Take 1 tablet by mouth daily.   11/11/2015 at Unknown time  . sertraline (ZOLOFT) 50 MG tablet Take 50 mg by mouth at bedtime.    11/11/2015 at Unknown time  . warfarin (COUMADIN) 4 MG tablet Take 4-8 mg by mouth daily. 4mg  daily except 8mg  on  Saturday   11/11/2015 at 1800  . Glucose Blood (FREESTYLE LITE TEST VI) USE AS DIRECTED   Taking   Scheduled:  . amLODipine  10 mg Oral Daily  . atenolol  50 mg Oral BID  . Calcium   Oral Daily  . donepezil  5 mg Oral QHS  . feeding supplement (GLUCERNA SHAKE)  237 mL Oral TID BM  . furosemide  40 mg Intravenous BID  . insulin aspart  0-5 Units Subcutaneous QHS  . [START ON 11/13/2015] insulin aspart  0-9 Units Subcutaneous TID WC  . insulin detemir  10 Units Subcutaneous QHS  . sodium chloride flush  3 mL Intravenous Q12H    Infusions:    Assessment: 82yo male with history of PAF on warfarin, CKD4, HTN, CAD and vascular dementia presents with dizziness when standing. Pharmacy is consulted to dose warfarin for atrial fibrillation. INR 2.16, Hgb 10.2, Plt 153, sCr 3.13.  PTA Warfarin Dose: 8mg  Sat and 4mg  AODs with last dose 4/11  Goal of Therapy:  INR 2-3 Monitor platelets by anticoagulation protocol: Yes   Plan:  Warfarin 4mg  tonight x1 Daily INR Monitor s/sx of bleeding  Andrey Cota. Diona Foley, PharmD, BCPS Clinical Pharmacist Pager [phone] 11/12/2015,4:52 PM

## 2015-11-12 NOTE — Progress Notes (Signed)
Spoke to daughter Dr. Beryl Meager. Daughter wanted to change code status to full code. Explained to the daughter that would need to speak to the wife who is the POA of the patient in order to have permission to discuss patient information. The daughter had the patient's wife call. Spoke to the wife and she states that she gives permission to have all of her daughters discuss and make arrangements of patient information. Explained to the wife that she would need a Password in order for that to occur. Wife gave password to the nurse. The daughter called back with the password and discussed that her mom and the family would like for the the patient to be a full code. Notified On-call doctor- Dr. Merrilyn Puma of this matter. Dr. Merrilyn Puma spoke with daughter - Eldridge Abrahams. Orders given for patient to be full code. Will continue to monitor patient to end of shift.

## 2015-11-13 ENCOUNTER — Encounter (HOSPITAL_COMMUNITY): Payer: Self-pay | Admitting: Physician Assistant

## 2015-11-13 DIAGNOSIS — Z7901 Long term (current) use of anticoagulants: Secondary | ICD-10-CM

## 2015-11-13 DIAGNOSIS — I480 Paroxysmal atrial fibrillation: Secondary | ICD-10-CM

## 2015-11-13 DIAGNOSIS — N179 Acute kidney failure, unspecified: Secondary | ICD-10-CM

## 2015-11-13 DIAGNOSIS — D649 Anemia, unspecified: Secondary | ICD-10-CM

## 2015-11-13 DIAGNOSIS — I130 Hypertensive heart and chronic kidney disease with heart failure and stage 1 through stage 4 chronic kidney disease, or unspecified chronic kidney disease: Principal | ICD-10-CM

## 2015-11-13 DIAGNOSIS — N184 Chronic kidney disease, stage 4 (severe): Secondary | ICD-10-CM

## 2015-11-13 DIAGNOSIS — N189 Chronic kidney disease, unspecified: Secondary | ICD-10-CM

## 2015-11-13 DIAGNOSIS — F0150 Vascular dementia without behavioral disturbance: Secondary | ICD-10-CM

## 2015-11-13 DIAGNOSIS — Z794 Long term (current) use of insulin: Secondary | ICD-10-CM

## 2015-11-13 DIAGNOSIS — E1122 Type 2 diabetes mellitus with diabetic chronic kidney disease: Secondary | ICD-10-CM

## 2015-11-13 LAB — CBC
HCT: 28.4 % — ABNORMAL LOW (ref 39.0–52.0)
Hemoglobin: 9.7 g/dL — ABNORMAL LOW (ref 13.0–17.0)
MCH: 30.6 pg (ref 26.0–34.0)
MCHC: 34.2 g/dL (ref 30.0–36.0)
MCV: 89.6 fL (ref 78.0–100.0)
PLATELETS: 148 10*3/uL — AB (ref 150–400)
RBC: 3.17 MIL/uL — AB (ref 4.22–5.81)
RDW: 15.5 % (ref 11.5–15.5)
WBC: 4.2 10*3/uL (ref 4.0–10.5)

## 2015-11-13 LAB — FERRITIN: Ferritin: 39 ng/mL (ref 24–336)

## 2015-11-13 LAB — BASIC METABOLIC PANEL
ANION GAP: 12 (ref 5–15)
BUN: 47 mg/dL — ABNORMAL HIGH (ref 6–20)
CO2: 26 mmol/L (ref 22–32)
CREATININE: 3.08 mg/dL — AB (ref 0.61–1.24)
Calcium: 9.2 mg/dL (ref 8.9–10.3)
Chloride: 106 mmol/L (ref 101–111)
GFR, EST AFRICAN AMERICAN: 19 mL/min — AB (ref >60)
GFR, EST NON AFRICAN AMERICAN: 16 mL/min — AB (ref >60)
GLUCOSE: 95 mg/dL (ref 65–99)
Potassium: 3.5 mmol/L (ref 3.5–5.1)
Sodium: 144 mmol/L (ref 135–145)

## 2015-11-13 LAB — GLUCOSE, CAPILLARY
GLUCOSE-CAPILLARY: 132 mg/dL — AB (ref 65–99)
GLUCOSE-CAPILLARY: 144 mg/dL — AB (ref 65–99)
Glucose-Capillary: 91 mg/dL (ref 65–99)
Glucose-Capillary: 169 mg/dL — ABNORMAL HIGH (ref 65–99)

## 2015-11-13 LAB — PROTIME-INR
INR: 2.18 — ABNORMAL HIGH (ref 0.00–1.49)
PROTHROMBIN TIME: 24 s — AB (ref 11.6–15.2)

## 2015-11-13 MED ORDER — DIPHENHYDRAMINE HCL 25 MG PO CAPS
25.0000 mg | ORAL_CAPSULE | Freq: Every evening | ORAL | Status: DC | PRN
  Administered 2015-11-13: 25 mg via ORAL
  Filled 2015-11-13: qty 1

## 2015-11-13 MED ORDER — WARFARIN SODIUM 2 MG PO TABS
4.0000 mg | ORAL_TABLET | ORAL | Status: DC
  Administered 2015-11-13 – 2015-11-14 (×2): 4 mg via ORAL
  Filled 2015-11-13 (×2): qty 2

## 2015-11-13 MED ORDER — WARFARIN SODIUM 5 MG PO TABS: 8.0000 mg | ORAL_TABLET | ORAL | Status: DC

## 2015-11-13 MED ORDER — FUROSEMIDE 10 MG/ML IJ SOLN
40.0000 mg | Freq: Once | INTRAMUSCULAR | Status: AC
  Administered 2015-11-13: 40 mg via INTRAVENOUS
  Filled 2015-11-13: qty 4

## 2015-11-13 NOTE — Evaluation (Signed)
Occupational Therapy Evaluation and Discharge Patient Details Name: Paul LEVECK, MD MRN: [id number] DOB: 11-11-1922 Today's Date: 11/13/2015    History of Present Illness [name] is a pleasant 82 y.o. Man with a history of CAD s/p CABG in 1991 and 2006, HTN, stage IV chronic kidney disease, tachybrady syndrome amnaged w/ dual chamber pacemaker, paroxysmal a-fib treated with warfarin, and vascular dementia, who presents to the ED with a 5 day history of increasing lower extremity edema and dizziness that he experienced this morning when he stood up.    Clinical Impression   Pt is functioning at his baseline in ADL and mobility. Has all necessary equipment and assistance at home. No further OT needs.    Follow Up Recommendations  No OT follow up;Supervision/Assistance - 24 hour    Equipment Recommendations  None recommended by OT    Recommendations for Other Services       Precautions / Restrictions Precautions Precautions: Fall Restrictions Weight Bearing Restrictions: No      Mobility Bed Mobility Overal bed mobility: Modified Independent                Transfers Overall transfer level: Needs assistance Equipment used: Rolling walker (2 wheeled) Transfers: Sit to/from Stand Sit to Stand: Min guard         General transfer comment: Min Guard for safety and balance. No physical assistance needed.     Balance     Sitting balance-Leahy Scale: Good       Standing balance-Leahy Scale: Poor                              ADL Overall ADL's : At baseline                                       General ADL Comments: Pt is self feeding and can perform grooming in sitting at sink. Assisted to bathroom.     Vision Additional Comments: hx of cataract sx with lens implants   Perception     Praxis      Pertinent Vitals/Pain Pain Assessment: No/denies pain     Hand Dominance Right   Extremity/Trunk Assessment Upper  Extremity Assessment Upper Extremity Assessment: Generalized weakness   Lower Extremity Assessment Lower Extremity Assessment: Generalized weakness   Cervical / Trunk Assessment Cervical / Trunk Assessment: Kyphotic   Communication Communication Communication: HOH   Cognition Arousal/Alertness: Awake/alert Behavior During Therapy: WFL for tasks assessed/performed Overall Cognitive Status: History of cognitive impairments - at baseline       Memory: Decreased short-term memory (hx of vascular dementia)             General Comments       Exercises       Shoulder Instructions      Home Living Family/patient expects to be discharged to:: Private residence Living Arrangements: Spouse/significant other Available Help at Discharge: Family;Available 24 hours/day Type of Home: House Home Access: Stairs to enter [employer] of Steps: 4 Entrance Stairs-Rails: None Home Layout: Two level;Able to live on main level with bedroom/bathroom     Bathroom Shower/Tub: Occupational psychologist: Standard     Home Equipment: Cane - single point;Shower seat;Walker - 2 wheels   Additional Comments: Has caregivers in the home 4 hours day M-F 10-2.  Wife assists pt when caregivers arent  there.        Prior Functioning/Environment Level of Independence: Needs assistance  Gait / Transfers Assistance Needed: Min guard assist with RW ADL's / Homemaking Assistance Needed: Max to Mod assist for bathing and dressing.   Comments: Pt is a retired Magazine features editor    OT Diagnosis: Generalized weakness;Cognitive deficits   OT Problem List:     OT Treatment/Interventions:      OT Goals(Current goals can be found in the care plan section) Acute Rehab OT Goals Patient Stated Goal: to go home  OT Frequency:     Barriers to D/C:            Co-evaluation              End of Session Equipment Utilized During Treatment: Gait belt;Rolling walker  Activity  Tolerance: Patient tolerated treatment well Patient left: in bed;with call bell/phone within reach;with family/visitor present   Time: [id number] OT Time Calculation (min): 21 min Charges:  OT General Charges $OT Visit: 1 Procedure OT Evaluation $OT Eval Moderate Complexity: 1 Procedure G-Codes:    Malka So 11/13/2015, 1:55 PM  [phone]

## 2015-11-13 NOTE — Progress Notes (Signed)
Subjective: Reports he is doing much better today.  Worked with PT this morning and walked down the hall and did not get short of breath.  Denies chest pain, palpitations, or dyspnea.    Objective: Vital signs in last 24 hours: Filed Vitals:   11/12/15 2020 11/13/15 0600 11/13/15 0653 11/13/15 1246  BP: 146/100  160/84 145/62  Pulse: 63  66 75  Temp: 97.9 F (36.6 C)  98.5 F (36.9 C) 97.7 F (36.5 C)  TempSrc: Oral  Oral Oral  Resp: 18  18 18   Height:      Weight:  66.588 kg (146 lb 12.8 oz)    SpO2: 100%  100% 95%   Weight change:   Intake/Output Summary (Last 24 hours) at 11/13/15 1347 Last data filed at 11/13/15 1300  Gross per 24 hour  Intake   2334 ml  Output   2975 ml  Net   -641 ml   General appearance: alert, cooperative and no distress Head: Normocephalic, without obvious abnormality, atraumatic Lungs: clear to auscultation bilaterally Heart: regular rate and rhythm Abdomen: soft, non-tender; bowel sounds normal; no masses,  no organomegaly Extremities: edema 1+ edema L LE to lower 1/3 of shin, trace edema on R LE Lab Results: Basic Metabolic Panel:  Recent Labs Lab 11/12/15 1103 11/13/15 0917  NA 142 144  K 3.7 3.5  CL 107 106  CO2 23 26  GLUCOSE 212* 95  BUN 47* 47*  CREATININE 3.13* 3.08*  CALCIUM 9.4 9.2  MG 2.0  --   PHOS 4.0  --    Liver Function Tests:  Recent Labs Lab 11/12/15 1103  AST 34  ALT 62  ALKPHOS 113  BILITOT 0.6  PROT 6.2*  ALBUMIN 3.3*   No results for input(s): LIPASE, AMYLASE in the last 168 hours. No results for input(s): AMMONIA in the last 168 hours. CBC:  Recent Labs Lab 11/12/15 1103 11/13/15 0538  WBC 3.9* 4.2  NEUTROABS 2.8  --   HGB 10.2* 9.7*  HCT 29.7* 28.4*  MCV 89.5 89.6  PLT 153 148*   Cardiac Enzymes:  Recent Labs Lab 11/12/15 1103  TROPONINI 0.04*   BNP: No results for input(s): PROBNP in the last 168 hours. D-Dimer: No results for input(s): DDIMER in the last 168  hours. CBG:  Recent Labs Lab 11/12/15 1805 11/12/15 2316 11/13/15 0653 11/13/15 1108  GLUCAP 194* 170* 132* 91     Recent Labs Lab 11/12/15 1103 11/13/15 0538  LABPROT 23.9* 24.0*  INR 2.16* 2.18*   Anemia Panel:  Recent Labs Lab 11/13/15 0538  FERRITIN 39     Recent Labs Lab 11/12/15 1106  COLORURINE YELLOW  LABSPEC 1.012  PHURINE 6.0  GLUCOSEU NEGATIVE  HGBUR NEGATIVE  BILIRUBINUR NEGATIVE  KETONESUR NEGATIVE  PROTEINUR NEGATIVE  NITRITE NEGATIVE  LEUKOCYTESUR NEGATIVE      Scheduled Meds: . amLODipine  10 mg Oral Daily  . atenolol  50 mg Oral BID  . calcium-vitamin D  1 tablet Oral Q breakfast  . donepezil  5 mg Oral QHS  . feeding supplement (GLUCERNA SHAKE)  237 mL Oral TID BM  . insulin aspart  0-5 Units Subcutaneous QHS  . insulin aspart  0-9 Units Subcutaneous TID WC  . insulin detemir  10 Units Subcutaneous QHS  . sodium chloride flush  3 mL Intravenous Q12H  . warfarin  4 mg Oral Once per day on Sun Mon Tue Wed Thu Fri  . [START ON 11/15/2015] warfarin  8 mg  Oral Once per day on Sat  . Warfarin - Pharmacist Dosing Inpatient   Does not apply q1800   Continuous Infusions:  PRN Meds:.senna Assessment/Plan: Principal Problem:   Acute exacerbation of CHF (congestive heart failure) (HCC) Active Problems:   Pacemaker   HTN (hypertension)   Hyperlipidemia   CAD (coronary artery disease)   DM (diabetes mellitus) (HCC)   Dementia   Atrial fibrillation (HCC)   Chronic anticoagulation   Heart failure with reduced ejection fraction ([place])   Acute on chronic renal failure [facility])  Mr Bobbitt is a 82 y.o. Man with a history of CAD, CKD, T2DM, HTN, a-fib, and vascular dementia who presents with "dizziness", LE edema, shortness of breath, BNP of 3358, infiltrates on CXR w/ bilateral pulmonary effusions, elevated creatinine (3.1) worrisome for congestive heart failure.   Mild CHF exaccerbation: Exam was improved today.  Crackles had resolved.  Put  out 1.7L in urine.  Creatinine stayed at 3.08, which suggests more of a cardiorenal picture (or intrinsic renal disease) than AKI due to hypovolemia.  Daughter wants a cardiology consult given his complicated history. Will plan to d/c tomorrow pending cardiology recommendations.   --Furosemide 40 mg IV daily --Consult cardiology   --Follow BMP daily to monitor worsening AKI  Stage IV CKD with superimposed AKI: Creatinine 3.08 despite diuresis.  Suggests worsening intrinsic renal disease or cardiorenal syndrome.  Will reduce dose of IV fuorsemide  To once daily and will check BMP tomorrow morning.  --Hold olmesartan given AKI --Monitor for worsening kidney function with daily BMP  Type II DM: Treated with DPP-4 inhibitor and long-acting insulin, although wife concedes that she has not been giving insulin since his finger-stick glucose has been well controlled on his oral medication.  --hold linagliptin --Insulin detemir 10 U/day --Sliding scale insulin  HTN:  Currently well controlled on home medications.  --continue home atenolol, amlodipine  Vascular Dementia: CT head shows unchanged subcortical white matter vascular changes. Mental status seems to be at baseline according to wife. Could also be contributing to unsteadiness on feet that he experienced this morning if he has experienced slight worsening of his disease that was not evident on CT scan.  --Continue home donepezil --PT recommends home PT  Normocytic Anemia: Hb 10.2 on admission down from recent baseline of ~11.5. FOBT was negative. Ferritin was low at 39, which suggests iron deficiency anemia --Start oral iron --Does not meet criteria for RBC transfusion with CHF  Paroxysmal A-fib: Treated with warfarin. Currently therapeutic w/ INR of 2.5 --continue warfarin --check INR daily --consult pharmacy for warfarin dosing  Diet:  Heart healthy diet DVT PPX: Patient anticoagulated w/  warfarin  Dispo: Patient does not meet standards for discharge at this time. Anticipate continued admission 1-2 days.    This is a Careers information officer Note.  The care of the patient was discussed with Dr. Lynnae January and the assessment and plan formulated with their assistance.  Please see their attached note for official documentation of the daily encounter.   LOS: 1 day   Burt Ek, Med Student 11/13/2015, 1:47 PM

## 2015-11-13 NOTE — Progress Notes (Signed)
ANTICOAGULATION CONSULT NOTE - Follow Up Consult  Pharmacy Consult for Coumadin Indication: atrial fibrillation  Allergies  Allergen Reactions  . Iodinated Diagnostic Agents Anaphylaxis  . Iohexol Anaphylaxis     Desc: RN states pt stated he had anyphalactic shock reaction to contrast approx 10 years ago.   . Ace Inhibitors Other (See Comments)    UNKNOWN  . Simvastatin Other (See Comments)    Muscle aches  . Aspirin     Other reaction(s): Abdominal Pain    Patient Measurements: Height: 5\' 10"  (177.8 cm) Weight: 146 lb 12.8 oz (66.588 kg) IBW/kg (Calculated) : 73  Vital Signs: Temp: 98.5 F (36.9 C) (04/13 0653) Temp Source: Oral (04/13 0653) BP: 160/84 mmHg (04/13 0653) Pulse Rate: 66 (04/13 0653)  Labs:  Recent Labs  11/12/15 1103 11/13/15 0538 11/13/15 0917  HGB 10.2* 9.7*  --   HCT 29.7* 28.4*  --   PLT 153 148*  --   LABPROT 23.9* 24.0*  --   INR 2.16* 2.18*  --   CREATININE 3.13*  --  3.08*  TROPONINI 0.04*  --   --     Estimated Creatinine Clearance: 15 mL/min (by C-G formula based on Cr of 3.08).  Assessment:   Continues on Coumadin for atrial fibrillation as prior to admission.   INR remains therapeutic (2.18) on home Coumadin regimen.  Goal of Therapy:  INR 2-3 Monitor platelets by anticoagulation protocol: Yes   Plan:   Continue Coumadin 4 mg daily except 8 mg on Wednesdays.  Daily PT/INR for now.  Arty Baumgartner, [place] Pager: [phone] 11/13/2015,12:37 PM

## 2015-11-13 NOTE — Progress Notes (Signed)
Subjective: Doing great today, patient has not complaints. No SOB, chest pain, or discomfort.   Objective: Vital signs in last 24 hours: Filed Vitals:   11/12/15 1707 11/12/15 2020 11/13/15 0600 11/13/15 0653  BP: 137/84 146/100  160/84  Pulse: 67 63  66  Temp: 97.7 F (36.5 C) 97.9 F (36.6 C)  98.5 F (36.9 C)  TempSrc: Oral Oral  Oral  Resp: 18 18  18   Height: 5\' 10"  (1.778 m)     Weight: 147 lb 14.4 oz (67.087 kg)  146 lb 12.8 oz (66.588 kg)   SpO2: 97% 100%  100%   Weight change:   Intake/Output Summary (Last 24 hours) at 11/13/15 1044 Last data filed at 11/13/15 1028  Gross per 24 hour  Intake   2014 ml  Output   2275 ml  Net   -261 ml   Physical Exam: General: Elderly AA male, alert, cooperative, NAD.  HEENT: PERRL, EOMI. Moist mucus membranes Neck: Full range of motion without pain, supple, no lymphadenopathy or carotid bruits. No JVD.  Lungs: Clear to ascultation bilaterally, normal work of respiration, no wheezes, rales, rhonchi Heart: RRR, no murmurs, gallops, or rubs Abdomen: Soft, non-tender, non-distended, BS + Extremities: No cyanosis, clubbing. +2 pitting edema on the RLE extending to just below the knee. LLE with trace pitting edema.  Neurologic: Alert & oriented X2, cranial nerves II-XII intact, strength grossly intact, sensation intact to light touch   Lab Results: Basic Metabolic Panel:  Recent Labs Lab 11/12/15 1103  NA 142  K 3.7  CL 107  CO2 23  GLUCOSE 212*  BUN 47*  CREATININE 3.13*  CALCIUM 9.4  MG 2.0  PHOS 4.0   Liver Function Tests:  Recent Labs Lab 11/12/15 1103  AST 34  ALT 62  ALKPHOS 113  BILITOT 0.6  PROT 6.2*  ALBUMIN 3.3*   CBC:  Recent Labs Lab 11/12/15 1103 11/13/15 0538  WBC 3.9* 4.2  NEUTROABS 2.8  --   HGB 10.2* 9.7*  HCT 29.7* 28.4*  MCV 89.5 89.6  PLT 153 148*   Cardiac Enzymes:  Recent Labs Lab 11/12/15 1103  TROPONINI 0.04*   CBG:  Recent Labs Lab 11/12/15 1805 11/12/15 2316  11/13/15 0653  GLUCAP 194* 170* 132*   Coagulation:  Recent Labs Lab 11/12/15 1103 11/13/15 0538  LABPROT 23.9* 24.0*  INR 2.16* 2.18*   Anemia Panel:  Recent Labs Lab 11/13/15 0538  FERRITIN 39   Urinalysis:  Recent Labs Lab 11/12/15 1106  COLORURINE YELLOW  LABSPEC 1.012  PHURINE 6.0  GLUCOSEU NEGATIVE  HGBUR NEGATIVE  BILIRUBINUR NEGATIVE  KETONESUR NEGATIVE  PROTEINUR NEGATIVE  NITRITE NEGATIVE  LEUKOCYTESUR NEGATIVE     Micro Results: Recent Results (from the past 240 hour(s))  Urine culture     Status: None (Preliminary result)   Collection Time: 11/12/15 11:06 AM  Result Value Ref Range Status   Specimen Description URINE, CLEAN CATCH  Final   Special Requests NONE  Final   Culture TOO YOUNG TO READ  Final   Report Status PENDING  Incomplete   Studies/Results: Dg Chest 2 View  11/12/2015  CLINICAL DATA:  Generalized weakness beginning earlier today. EXAM: CHEST  2 VIEW COMPARISON:  12/29/2012. FINDINGS: Cardiomegaly. Previous CABG. Dual lead pacer. BILATERAL effusions, lower lobe opacity, interstitial prominence consistent with early CHF. Worsening aeration from priors. No pneumothorax. IMPRESSION: Early CHF. Electronically Signed   By: Staci Righter M.D.   On: 11/12/2015 12:15   Ct Head  Wo Contrast  11/12/2015  CLINICAL DATA:  82 year old male with multiple falls, generalized weakness. Initial encounter. EXAM: CT HEAD WITHOUT CONTRAST TECHNIQUE: Contiguous axial images were obtained from the base of the skull through the vertex without intravenous contrast. COMPARISON:  Head CT without contrast 09/14/2011. FINDINGS: Mild sphenoid and ethmoid sinus mucosal thickening is new. Other Visualized paranasal sinuses and mastoids are clear. No acute scalp or orbits soft tissue findings. No acute osseous abnormality identified. Calcified atherosclerosis at the skull base. Stable cerebral volume. Patchy and confluent bilateral white matter hypodensity appears stable.  No midline shift, mass effect, or evidence of intracranial mass lesion. Stable ventricle size and configuration. No acute intracranial hemorrhage identified. No cortically based acute infarct identified. Chronic linear right cerebellar are lacunar infarct, and probable bilateral thalamic lacunar infarcts are stable. No suspicious intracranial vascular hyperdensity. IMPRESSION: 1. Stable appearance of chronic small vessel disease since 2013. No acute intracranial abnormality. 2. New mild sphenoid and ethmoid sinus inflammation. Electronically Signed   By: Genevie Ann M.D.   On: 11/12/2015 15:14   Medications: I have reviewed the patient's current medications. Scheduled Meds: . amLODipine  10 mg Oral Daily  . atenolol  50 mg Oral BID  . calcium-vitamin D  1 tablet Oral Q breakfast  . donepezil  5 mg Oral QHS  . feeding supplement (GLUCERNA SHAKE)  237 mL Oral TID BM  . furosemide  40 mg Intravenous BID  . insulin aspart  0-5 Units Subcutaneous QHS  . insulin aspart  0-9 Units Subcutaneous TID WC  . insulin detemir  10 Units Subcutaneous QHS  . sodium chloride flush  3 mL Intravenous Q12H  . 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   Acute on Chronic sCHF: Doing quite well today. Diuresed ~1.5L overnight. Cr stable. Weight down ~1 lb. Lungs clear, no SOB, chest pain. No JVD. Only mild edema in the legs R>L (chronic). Discussed with family, daughters wish for cardiology involvement. Patient follows with Dr. Tamala Julian.  -Cardiology consulted at request of family -Continue Lasix 40 mg IV once daily -Repeat BMP in AM -Continue Atenolol -Continue to hold home Olmesartan given labile Cr currently  AKI on CKD Stage IV: Baseline of ~2.3 in  December 2016, now 3.08.No significant change from yesterday, now s/p 40 IV bid of Lasix.   Recent Labs Lab 11/12/15 1103 11/13/15 0917  CREATININE 3.13* 3.08*  -Lasix as above.  -Hold ARB -Repeat BMP In AM  DM Type II: Last HbA1c 7.3.GLucose trend as follows:   Recent Labs Lab 11/12/15 1805 11/12/15 2316 11/13/15 0653 11/13/15 1108  GLUCAP 194* 170* 132* 91  -Hold linagliptin -Continue Levemir 10 units qhs -ISS  HTN: BP 137/84.Currently well controlled on home medications. -Continue home atenolol, amlodipine  Vascular Dementia: Stable. PT recommends HH PT.  -Continue home donepezil -Assess for falls risk  Normocytic Anemia: Ferritin 39, suggestive of IDA.  -Start ferrous sulfate 325 mg daily on discharge.   Paroxysmal A-fib: Currently in sinus rhythm. He is on Atenolol and anticoagulated with coumadin at home. Currently therapeutic w/ INR of 2.5 -Continue Atenolol  -Continue warfarin -Check INR daily  DVT/PE PPx: Coumadin   Dispo: Disposition is deferred at this time, awaiting improvement of  current medical problems.  Anticipated discharge in approximately 1-2 day(s).   The patient does have a current PCP [name], MD) and does need an [facility] hospital follow-up appointment after discharge.  The patient does not have transportation limitations that hinder transportation to clinic appointments.  .Services Needed at time of discharge: Y = Yes, Blank = No PT:   OT:   RN:   Equipment:   Other:     LOS: 1 day   Corky Sox, MD 11/13/2015, 10:44 AM

## 2015-11-13 NOTE — Consult Note (Signed)
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  Admit date: 11/12/2015  Requesting Physician: Dr. Lynnae January Primary Physician:   [name], MD Primary Cardiologist:  Dr. 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 Patient found to have acute on chronic systolic CHF and family requested that cardiology be consulted.  He was last seen in the office by Dr. Tamala Julian in 04/2015. Hydralazine was discontinued due to labile BPs. Last 2-D echo was in 11/2014 which showed EF 35-40%, global hypokinesis, probable borderline dilated right ventricle with low normal systolic function, moderate TR with severe pulmonary HTN with a PAS P of 69 mmHg.  He was in his usual state of health until about 6 days ago when he started noticing increased lower extremity swelling. He saw his PCP who increased his dose of Lasix. Despite increasing Lasix from 20 mg to 40 mg daily, he continued to have shortness of breath and lower extremity swelling. The patient has baseline dementia and history is difficult. His wife is not present in the room today. He was generally just not feeling well and presented to the ER for medical attention.  In the ED a CXR showed interstitial infiltrates and bilateral pulmonary effusions.BNP was elevated over 3000. Also he had a AKI with a creatinine up to 3. A CT scan showed unchanged subcortical white matter vascular disease, and ethmoid and sphenoid sinus inflammation. He was given 40 mg IV furosemide and admitted to the internal medicine teaching service for further work-up and treatment.   Since being started on IV Lasix he  is feeling much better. No chest pain. Patient can't qualify much more than that. He is pleasantly demented.    Past Medical History  Diagnosis Date  . Hypertension   . Diabetes mellitus   . Pacemaker   . Left leg DVT ([place])   . CKD (chronic kidney disease)   . Depression   . HTN (hypertension)   . Gait abnormality   . CAD (coronary artery disease)     with prior CABG 1991 and 2006 LIMA LAD, seq. SVG D1-Cfx, SVG d2,and seq. SVG AM-PDA Cardiology Dr. Tamala Julian  . Dementia   . Cancer [facility])     prostate     Past Surgical History  Procedure Laterality Date  . Prostatectomy    . Coronary artery bypass graft      Allergies  Allergen Reactions  . Iodinated Diagnostic Agents Anaphylaxis  . Iohexol Anaphylaxis     Desc: RN states pt stated he had anyphalactic shock reaction to contrast approx 10 years ago.   . Ace Inhibitors Other (See Comments)    UNKNOWN  . Simvastatin Other (See Comments)    Muscle aches  . Aspirin     Other reaction(s): Abdominal Pain    I have reviewed the patient's current medications . amLODipine  10 mg Oral Daily  . atenolol  50 mg Oral BID  . calcium-vitamin D  1 tablet Oral Q breakfast  . donepezil  5 mg Oral QHS  . feeding supplement (GLUCERNA SHAKE)  237 mL Oral TID BM  . insulin  aspart  0-5 Units Subcutaneous QHS  . insulin aspart  0-9 Units Subcutaneous TID WC  . insulin detemir  10 Units Subcutaneous QHS  . sodium chloride flush  3 mL Intravenous Q12H  . warfarin  4 mg Oral Once per day on Sun Mon Tue Wed Thu Fri  . [START ON 11/15/2015] warfarin  8 mg Oral Once per day on Sat  . Warfarin - Pharmacist Dosing Inpatient   Does not apply q1800     senna  Prior to Admission medications   Medication Sig Start Date End Date Taking? Authorizing Provider  amLODipine (NORVASC) 10 MG tablet Take 10 mg by mouth daily. 12/31/14  Yes Historical Provider, MD  atenolol (TENORMIN) 50 MG tablet Take 50 mg by mouth 2 (two) times daily.    Yes Historical  Provider, MD  buPROPion (WELLBUTRIN) 100 MG tablet Take 1 tablet by mouth daily. 12/09/14  Yes Historical Provider, MD  CALCIUM PO Take 1 tablet by mouth daily.   Yes Historical Provider, MD  Coenzyme Q10 (COQ10 PO) Take 1 capsule by mouth daily.   Yes Historical Provider, MD  donepezil (ARICEPT) 5 MG tablet Take 5 mg by mouth at bedtime.   Yes Historical Provider, MD  feeding supplement, GLUCERNA SHAKE, (GLUCERNA SHAKE) LIQD Take 237 mLs by mouth 3 (three) times daily between meals. Patient taking differently: Take 237 mLs by mouth 2 (two) times daily as needed.  07/07/15  Yes Iline Oven, MD  furosemide (LASIX) 20 MG tablet Take 10 mg by mouth daily. 01/29/15  Yes Historical Provider, MD  Insulin Detemir (LEVEMIR FLEXPEN) 100 UNIT/ML Pen Inject 16 Units into the skin daily as needed (over 200).    Yes Historical Provider, MD  linagliptin (TRADJENTA) 5 MG TABS tablet Take 7.5 mg by mouth daily.   Yes Historical Provider, MD  Multiple Vitamins-Minerals (ICAPS PO) Take 1 tablet by mouth 2 (two) times daily.   Yes Historical Provider, MD  olmesartan (BENICAR) 40 MG tablet Take 40 mg by mouth daily.    Yes Historical Provider, MD  ondansetron (ZOFRAN) 4 MG tablet Take 1 tablet (4 mg total) by mouth every 6 (six) hours as needed for nausea or vomiting. 07/06/15  Yes Iline Oven, MD  polyethylene glycol [facility] / GLYCOLAX) packet Take 17 g by mouth every other day.    Yes Historical Provider, MD  Probiotic Product (ALIGN PO) Take 1 capsule by mouth daily.   Yes Historical Provider, MD  SENNA PO Take 1 tablet by mouth daily.   Yes Historical Provider, MD  sertraline (ZOLOFT) 50 MG tablet Take 50 mg by mouth at bedtime.    Yes Historical Provider, MD  warfarin (COUMADIN) 4 MG tablet Take 4-8 mg by mouth daily. 4mg  daily except 8mg  on  Saturday   Yes Historical Provider, MD  Glucose Blood (FREESTYLE LITE TEST VI) USE AS DIRECTED 06/02/13   Historical Provider, MD     Social History   Social  History  . Marital Status: Married    Spouse Name: N/A  . Number of Children: N/A  . Years of Education: N/A   Occupational History  . Not on file.   Social History Main Topics  . Smoking status: Never Smoker   . Smokeless tobacco: Never Used  . Alcohol Use: No  . Drug Use: No  . Sexual Activity: Not on file   Other Topics Concern  . Not on file   Social History Narrative    Family Status  Relation Status Death Age  . Mother Deceased   . Father Deceased   . Maternal Grandmother Deceased   . Maternal Grandfather Deceased   . Paternal Grandmother Deceased   . Paternal Grandfather Deceased   . Sister Deceased   . Brother Deceased   . Sister Deceased   . Sister Deceased   . Sister Deceased    Family History  Problem Relation Age of Onset  . Diabetes Mother   . Heart disease Sister   . Heart disease Sister   . Heart disease Sister   . Heart disease Sister      ROS:  Full 14 point review of systems complete and found to be negative unless listed above.  Physical Exam: Blood pressure 145/62, pulse 75, temperature 97.7 F (36.5 C), temperature source Oral, resp. rate 18, height 5\' 10"  (1.778 m), weight 146 lb 12.8 oz (66.588 kg), SpO2 95 %.  General: Well developed, well nourished, male in no acute distress. Pleasantly demented Head: Eyes PERRLA, No xanthomas.   Normocephalic and atraumatic, oropharynx without edema or exudate.  Lungs: mild crackes at bases Heart: HRRR S1 S2, no rub/gallop, Heart regular rate and rhythm with S1, S2  + murmur. pulses are 2+ extrem.   Neck: No carotid bruits. No lymphadenopathy. mild JVD. Abdomen: Bowel sounds present, abdomen soft and non-tender without masses or hernias noted. Msk:  No spine or cva tenderness. No weakness, no joint deformities or effusions. Extremities: No clubbing or cyanosis. 1+ bilateral LE edema.  Neuro: Alert and oriented X 3. No focal deficits noted. Psych:  Good affect, responds appropriately Skin: No rashes  or lesions noted.  Labs:   Lab Results  Component Value Date   WBC 4.2 11/13/2015   HGB 9.7* 11/13/2015   HCT 28.4* 11/13/2015   MCV 89.6 11/13/2015   PLT 148* 11/13/2015    Recent Labs  11/13/15 0538  INR 2.18*    Recent Labs Lab 11/12/15 1103 11/13/15 0917  NA 142 144  K 3.7 3.5  CL 107 106  CO2 23 26  BUN 47* 47*  CREATININE 3.13* 3.08*  CALCIUM 9.4 9.2  PROT 6.2*  --   BILITOT 0.6  --   ALKPHOS 113  --   ALT 62  --   AST 34  --   GLUCOSE 212* 95  ALBUMIN 3.3*  --    MAGNESIUM  Date Value Ref Range Status  11/12/2015 2.0 1.7 - 2.4 mg/dL Final    Recent Labs  11/12/15 1103  TROPONINI 0.04*     Echo: see HPI. Repeat pending  ECG:  AV paced  Radiology:  Dg Chest 2 View  11/12/2015  CLINICAL DATA:  Generalized weakness beginning earlier today. EXAM: CHEST  2 VIEW COMPARISON:  12/29/2012. FINDINGS: Cardiomegaly. Previous CABG. Dual lead pacer. BILATERAL effusions, lower lobe opacity, interstitial prominence consistent with early CHF. Worsening aeration from priors. No pneumothorax. IMPRESSION: Early CHF. Electronically Signed   By: Staci Righter M.D.   On: 11/12/2015 12:15   Ct Head Wo Contrast  11/12/2015  CLINICAL DATA:  82 year old male with multiple falls, generalized weakness. Initial encounter. EXAM: CT HEAD WITHOUT CONTRAST TECHNIQUE: Contiguous axial images were obtained from the base of the skull through the vertex without intravenous contrast. COMPARISON:  Head CT without contrast 09/14/2011. FINDINGS: Mild sphenoid and ethmoid sinus mucosal thickening is new. Other Visualized paranasal sinuses and mastoids are clear. No acute scalp or orbits soft tissue findings. No acute osseous abnormality identified. Calcified atherosclerosis at  the skull base. Stable cerebral volume. Patchy and confluent bilateral white matter hypodensity appears stable. No midline shift, mass effect, or evidence of intracranial mass lesion. Stable ventricle size and  configuration. No acute intracranial hemorrhage identified. No cortically based acute infarct identified. Chronic linear right cerebellar are lacunar infarct, and probable bilateral thalamic lacunar infarcts are stable. No suspicious intracranial vascular hyperdensity. IMPRESSION: 1. Stable appearance of chronic small vessel disease since 2013. No acute intracranial abnormality. 2. New mild sphenoid and ethmoid sinus inflammation. Electronically Signed   By: Genevie Ann M.D.   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 Patient found to have acute on chronic systolic CHF and family requested that cardiology be consulted.  Acute on chronic systolic CHF: last EF 123456 in 11/2014. Repeat 2D ECHO pending.  -- CXR with CHF and BNP >3K. S/s volume overload.  -- Given IV lasix 40mg  x3. Net neg 459mL. Weigh down 1 lb. Patient is feeling much better. Creat improved slightly.  -- Continue BB. ARB held due to AKI. Would recommend hydralazine +nitrates in the place of a ARB; however  Dr. Thompson Caul notes, he has not tolerated hydralazine in the past due to labile blood pressures.  Stage IV CKD with superimposed AKI: increased creatine of 3.1 from a baseline of ~2.3 in 07/2015. Home olmesartan held given AKI. Dr. Tamala Julian has had some  trepidation about this in the past. This may need to be discontinued permanently.  HTN: Currently well controlled on home medications.Continue home atenolol, amlodipine  Vascular Dementia: CT head shows unchanged subcortical white matter vascular changes. Mental status seems to be at baseline. Continue home donepezil  Paroxysmal A-fib: CHADSVASC of 5 (CHF, HTN, Age, vasc dz). Treated with warfarin. Currently therapeutic w/ INR of 2.5  Tachy-brady syndrome s/p PPM: AV paced.  Type II DM: per IM   Signed: Eileen Stanford, PA-C 11/13/2015 3:26 PM  Pager [phone]  Co-Sign MD  History and all data above reviewed.  Patient examined.  I agree with the findings as above.  The patient was admitted with evidence for some volume overload.  However, he is pleasantly demented and unable to give details.  He denies any SOB, PND or orthopnea.  No chest pain or palpitations.    The patient exam reveals COR:RRR  ,  Lungs: Clear  ,  Abd: Positive bowel sounds, no rebound no guarding, Ext Mild edema  Neck:  JVD .  All available labs, radiology testing, previous records reviewed. Agree with documented assessment and plan.  Acute on chronic systolic and diastolic HF:  Give IV diuresis tonight.  However, I doubt that he will need more aggressive diuresis than this.  If he has increasing creat in the AM I would suggest changing from ARB to  BiDil.    [name]  5:07 PM  11/13/2015

## 2015-11-13 NOTE — Progress Notes (Signed)
Home sitter states patient is now experiencing sundowning and is requesting for Benadryl in which the family has also stated that he takes it every night at bedtime. Last night the patient had a taken benadryl but was ordered as a one time dose. Notified on-call teaching service. Orders given for Benadryl and will administer per order. Will continue to monitor patient to end of shift.

## 2015-11-13 NOTE — Discharge Instructions (Addendum)
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 You were hospitalized for a mild congestive heart failure exacerbation.  - We will call your Cardiologist, Dr. Tamala Julian, on Monday to schedule you a hospital follow up appointment. The office will contact you with the details. - Please call your PCP, Dr. Fredderick Phenix, to have him come by and see you within the next 1-2 weeks - You will be starting a few new medications: ferrous sulfate (iron) for your anemia, Bidil (isosorbide-hydralazine 20-37.5 mg three times daily) for your CHF, and Ramelteon 8 mg at bedtime for sleep - You will be on Lasix 20 mg daily. This may change when you see Dr. Tamala Julian.  - Stop taking Olmesartan until you see your PCP and have repeat blood work (BMP). We want to make sure your kidney function is still going in the right direction before restarting this medication. - To obtain your medical records, you will need to go to the Medical Records Department on the 1st floor of the hospital, near the Emergency Room   Take care, Dr. Arcelia Jew      Information on my medicine - Coumadin   (Warfarin)  This medication education was reviewed with me or my healthcare representative as part of my discharge preparation.  The pharmacist that spoke with me during my hospital stay was:  Arty Baumgartner, [facility]  Why was Coumadin prescribed for you? Coumadin was prescribed for you because you have a blood clot or a medical condition that can cause an increased risk of forming blood clots. Blood clots can cause serious health problems by blocking the flow of blood to the heart, lung, or brain. Coumadin can prevent harmful blood clots from forming. As a reminder your indication for Coumadin is:   Stroke Prevention Because Of Atrial Fibrillation  What test will check on my response to Coumadin? While on Coumadin (warfarin) you will need to have an INR test regularly to ensure that your dose is keeping you in the desired range. The INR (international  normalized ratio) number is calculated from the result of the laboratory test called prothrombin time (PT).  If an INR APPOINTMENT HAS NOT ALREADY BEEN MADE FOR YOU please schedule an appointment to have this lab work done by your health care provider within 7 days. Your INR goal is usually a number between:  2 to 3 or your provider may give you a more narrow range like 2-2.5.  Ask your health care provider during an office visit what your goal INR is.  What  do you need to  know  About  COUMADIN? Take Coumadin (warfarin) exactly as prescribed by your healthcare provider about the same time each day.  DO NOT stop taking without talking to the doctor who prescribed the medication.  Stopping without other blood clot prevention medication to take the place of Coumadin may increase your risk of developing a new clot or stroke.  Get refills before you run out.  What do you do if you miss a dose? If you miss a dose, take it as soon as you remember on the same day then continue your regularly scheduled regimen the next day.  Do not take two doses of Coumadin at the same time.  Important Safety Information A possible side effect of Coumadin (Warfarin) is an increased risk of bleeding. You should call your healthcare provider right away if you experience any of the following: ? Bleeding from an injury or your nose that does not stop. ?  Unusual colored urine (red or dark brown) or unusual colored stools (red or black). ? Unusual bruising for unknown reasons. ? A serious fall or if you hit your head (even if there is no bleeding).  Some foods or medicines interact with Coumadin (warfarin) and might alter your response to warfarin. To help avoid this: ? Eat a balanced diet, maintaining a consistent amount of Vitamin K. ? Notify your provider about major diet changes you plan to make. ? Avoid alcohol or limit your intake to 1 drink for women and 2 drinks for men per day. (1 drink is 5 oz. wine, 12 oz.  beer, or 1.5 oz. liquor.)  Make sure that ANY health care provider who prescribes medication for you knows that you are taking Coumadin (warfarin).  Also make sure the healthcare provider who is monitoring your Coumadin knows when you have started a new medication including herbals and non-prescription products.  Coumadin (Warfarin)  Major Drug Interactions  Increased Warfarin Effect Decreased Warfarin Effect  Alcohol (large quantities) Antibiotics (esp. Septra/Bactrim, Flagyl, Cipro) Amiodarone (Cordarone) Aspirin (ASA) Cimetidine (Tagamet) Megestrol (Megace) NSAIDs (ibuprofen, naproxen, etc.) Piroxicam (Feldene) Propafenone (Rythmol SR) Propranolol (Inderal) Isoniazid (INH) Posaconazole (Noxafil) Barbiturates (Phenobarbital) Carbamazepine (Tegretol) Chlordiazepoxide (Librium) Cholestyramine (Questran) Griseofulvin Oral Contraceptives Rifampin Sucralfate (Carafate) Vitamin K   Coumadin (Warfarin) Major Herbal Interactions  Increased Warfarin Effect Decreased Warfarin Effect  Garlic Ginseng Ginkgo biloba Coenzyme Q10 Green tea St. Johns wort    Coumadin (Warfarin) FOOD Interactions  Eat a consistent number of servings per week of foods HIGH in Vitamin K (1 serving =  cup)  Collards (cooked, or boiled & drained) Kale (cooked, or boiled & drained) Mustard greens (cooked, or boiled & drained) Parsley *serving size only =  cup Spinach (cooked, or boiled & drained) Swiss chard (cooked, or boiled & drained) Turnip greens (cooked, or boiled & drained)  Eat a consistent number of servings per week of foods MEDIUM-HIGH in Vitamin K (1 serving = 1 cup)  Asparagus (cooked, or boiled & drained) Broccoli (cooked, boiled & drained, or raw & chopped) Brussel sprouts (cooked, or boiled & drained) *serving size only =  cup Lettuce, raw (green leaf, endive, romaine) Spinach, raw Turnip greens, raw & chopped   These websites have more information on Coumadin (warfarin):   FailFactory.se; VeganReport.com.au;

## 2015-11-13 NOTE — Evaluation (Signed)
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 Man with a history of CAD s/p CABG in 1991 and 2006, HTN, stage IV chronic kidney disease, tachybrady syndrome amnaged w/ dual chamber pacemaker, paroxysmal a-fib treated with warfarin, and vascular dementia, who presents to the ED with a 5 day history of increasing lower extremity edema and dizziness that he experienced this morning when he stood up.     Clinical Impression  Pt admitted with above diagnosis. Pt currently with functional limitations due to the deficits listed below (see PT Problem List). Pt has somewhat unsteady gait with use of RW and needs VC's for correct use of RW. He also drags his feet which causes him to have LOB anteriorly. Pt will benefit from skilled PT to increase their independence and safety with mobility to allow discharge to the venue listed below.  Pt has adequate level of assistance to return home but pt is a high fall risk and would benefit from HHPT to increase mobility and decrease fall risk upon returning home.      Follow Up Recommendations Home health PT;Supervision/Assistance - 24 hour    Equipment Recommendations  None recommended by PT    Recommendations for Other Services       Precautions / Restrictions Precautions Precautions: Fall Restrictions Weight Bearing Restrictions: No      Mobility  Bed Mobility Overal bed mobility: Modified Independent             General bed mobility comments: Independent with HOB elevated and use of hand rails. Increased time but no physical assistance needed.   Transfers Overall transfer level: Needs assistance Equipment used: Rolling walker (2 wheeled) Transfers: Sit to/from Stand Sit to Stand: Min guard         General transfer comment: Min Guard for safety and balance. No physical assistance needed.    Ambulation/Gait Ambulation/Gait assistance: Min guard Ambulation Distance (Feet): 200 Feet Assistive device: Rolling walker (2 wheeled) Gait Pattern/deviations: Step-through pattern;Decreased stride length;Trunk flexed;Narrow base of support;Decreased dorsiflexion - left;Decreased dorsiflexion - right Gait velocity: decreased   General Gait Details: Pt has somewhat unsteady gait with RW and needs VC's for correct use of RW. Pt has decreased dorsiflexion bilaterally so his feet tend to catch and shorten his step length. He had one LOB anteriorly due to inadequate step length. Asked me several times "why are we doing this?"  Stairs            Wheelchair Mobility    Modified Rankin (Stroke Patients Only)       Balance Overall balance assessment: Needs assistance Sitting-balance support: No upper extremity supported;Feet supported Sitting balance-Leahy Scale: Good Sitting balance - Comments: Able to sit EOB unsupported with hands in his lap.l    Standing balance support: Bilateral upper extremity supported Standing balance-Leahy Scale: Poor Standing balance comment: Reliant on UE support.              High level balance activites: Turns High Level Balance Comments: Min A to turn walker around safely as he tends to step outside of it.              Pertinent Vitals/Pain Pain Assessment: No/denies pain  Vitals stable throughout session on room air.     Home Living Family/patient expects to be discharged to:: Private residence Living Arrangements: Spouse/significant other Available Help at Discharge: Family;Available 24  hours/day Type of Home: House Home Access: Stairs to enter Entrance Stairs-Rails: None Entrance Stairs-Number of Steps: 4 Home Layout: Two level;Able to live on main level with bedroom/bathroom Home Equipment: Kasandra Knudsen - single point;Shower seat;Walker - 2 wheels Additional Comments: Has caregivers in the home 4 hours day M-F 10-2.  Wife assists pt  when caregivers arent there.      Prior Function Level of Independence: Needs assistance   Gait / Transfers Assistance Needed: Min guard assist with RW  ADL's / Homemaking Assistance Needed: Max to Mod assist for bathing and dressing.  Comments: Pt is a retired Marine scientist   Dominant Hand: Right    Extremity/Trunk Assessment   Upper Extremity Assessment: Generalized weakness           Lower Extremity Assessment: Generalized weakness      Cervical / Trunk Assessment: Kyphotic  Communication   Communication: No difficulties  Cognition Arousal/Alertness: Awake/alert Behavior During Therapy: WFL for tasks assessed/performed Overall Cognitive Status: History of cognitive impairments - at baseline       Memory: Decreased short-term memory (Hx of vascular dementia. )              General Comments General comments (skin integrity, edema, etc.): Pt very pleasant and cooperative with therapy. Privated duty caregiver in room and present for evaluation and called his primary caregiver to assist with history as pt was unable to provide accurate history. He knew where he was but did not know month or year. A,lso thought he was 82 years old.     Exercises        Assessment/Plan    PT Assessment Patient needs continued PT services  PT Diagnosis Difficulty walking;Abnormality of gait;Generalized weakness   PT Problem List Decreased strength;Decreased activity tolerance;Decreased balance;Decreased mobility;Decreased cognition;Decreased safety awareness;Decreased knowledge of use of DME;Cardiopulmonary status limiting activity  PT Treatment Interventions Gait training;DME instruction;Stair training;Functional mobility training;Therapeutic activities;Therapeutic exercise;Balance training;Patient/family education   PT Goals (Current goals can be found in the Care Plan section) Acute Rehab PT Goals Patient Stated Goal: to go home PT Goal Formulation:  With patient Time For Goal Achievement: 11/27/15 Potential to Achieve Goals: Good    Frequency Min 3X/week   Barriers to discharge        Co-evaluation               End of Session Equipment Utilized During Treatment: Gait belt Activity Tolerance: Patient tolerated treatment well Patient left: in chair;with call bell/phone within reach;with chair alarm set;with family/visitor present Nurse Communication: Mobility status         Time: [id number] PT Time Calculation (min) (ACUTE ONLY): 19 min   Charges:   PT Evaluation $PT Eval Moderate Complexity: 1 Procedure     PT G Codes:       Colon Branch, SPT Colon Branch 11/13/2015, 10:31 AM

## 2015-11-13 NOTE — Progress Notes (Signed)
  Date: 11/13/2015  Patient name: Paul Rounds, [name]  Medical record number: [id number]  Date of birth: 1923/02/24   I have seen and evaluated Paul Rounds, [name] and discussed their care with the Residency Team. 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 He has admitted for sxs of dizziness, weakness, note feeling well, and increased LE edema. Lasix had been recently increased from 20 QD to 40 QD as an outpt. He was felt to have a mild CHF exac and was diurese which he tolerated well.    PMHx, Fam Hx, and/or Soc Hx : Lives with wife. Two adult daughter, both physicians. Has personal aides at home.   Filed Vitals:   11/13/15 0653 11/13/15 1246  BP: 160/84 145/62  Pulse: 66 75  Temp: 98.5 F (36.9 C) 97.7 F (36.5 C)  Resp: 18 18   Gen Appears well. NAD.Breathing comfortably.  H irr irr L CTAB with good air flow Ext trace edema R>L  Cr 3.08 from 3.13. Baseline slowly increased, most recently 2.35 HgB slow downward trend over several yrs, now 9.7 Ferritin 39 Trop I neg for ischemia BNP 3300  I indep reviewed the CXR images and confirmed my reading with the official CXR reading. CHF  I indep reviewed the EKG and confirmed my reading with the official EKG reading.   Assessment and Plan: I have seen and evaluated the patient as outlined above. 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  1. Mild acute on chronic CHF - He is net negative 641 cc and one lb. His creatine is stable despite IV lasix. We will titrate down the IV lasix and transition to home dose in AM. The cause is not AMI, sepsis. Could be progression of heart dz, renal dz, maybe dietary? Stable. Plan for D/C in AM  2. Acute on chronic renal failure - his cr seems to be  slowly rising over the past several yrs so this elevation is likely both natural progression and decreased outpt from HF. Cont to follow,  3. Iron def anemia - can exacerbate HF. FeSO4 QD.  [name], [name] 4/13/20171:20 PM

## 2015-11-13 NOTE — Progress Notes (Addendum)
Dr. Andrez Grime (daughter of pt) called requesting that attending physician return her call at (cell) [phone].  [place] [employer] treasurer) stated that she would call her back.  Daughter's contact information has been placed on the Epic summary page of chart.

## 2015-11-14 ENCOUNTER — Inpatient Hospital Stay (HOSPITAL_COMMUNITY): Payer: Medicare Other

## 2015-11-14 DIAGNOSIS — I5022 Chronic systolic (congestive) heart failure: Secondary | ICD-10-CM

## 2015-11-14 DIAGNOSIS — R0600 Dyspnea, unspecified: Secondary | ICD-10-CM

## 2015-11-14 LAB — BASIC METABOLIC PANEL
Anion gap: 11 (ref 5–15)
BUN: 50 mg/dL — AB (ref 6–20)
CO2: 28 mmol/L (ref 22–32)
Calcium: 9 mg/dL (ref 8.9–10.3)
Chloride: 105 mmol/L (ref 101–111)
Creatinine, Ser: 3.2 mg/dL — ABNORMAL HIGH (ref 0.61–1.24)
GFR, EST AFRICAN AMERICAN: 18 mL/min — AB (ref >60)
GFR, EST NON AFRICAN AMERICAN: 16 mL/min — AB (ref >60)
Glucose, Bld: 160 mg/dL — ABNORMAL HIGH (ref 65–99)
POTASSIUM: 3.6 mmol/L (ref 3.5–5.1)
SODIUM: 144 mmol/L (ref 135–145)

## 2015-11-14 LAB — ECHOCARDIOGRAM COMPLETE
HEIGHTINCHES: 70 in
Weight: 2291.2 oz

## 2015-11-14 LAB — GLUCOSE, CAPILLARY
GLUCOSE-CAPILLARY: 124 mg/dL — AB (ref 65–99)
GLUCOSE-CAPILLARY: 118 mg/dL — AB (ref 65–99)
Glucose-Capillary: 145 mg/dL — ABNORMAL HIGH (ref 65–99)
Glucose-Capillary: 155 mg/dL — ABNORMAL HIGH (ref 65–99)
Glucose-Capillary: 186 mg/dL — ABNORMAL HIGH (ref 65–99)

## 2015-11-14 LAB — URINE CULTURE

## 2015-11-14 LAB — HEMOGLOBIN A1C
HEMOGLOBIN A1C: 6.8 % — AB (ref 4.8–5.6)
Mean Plasma Glucose: 148 mg/dL

## 2015-11-14 LAB — PROTIME-INR
INR: 2.03 — AB (ref 0.00–1.49)
Prothrombin Time: 22.9 seconds — ABNORMAL HIGH (ref 11.6–15.2)

## 2015-11-14 MED ORDER — ISOSORB DINITRATE-HYDRALAZINE 20-37.5 MG PO TABS
1.0000 | ORAL_TABLET | Freq: Three times a day (TID) | ORAL | Status: DC
  Administered 2015-11-14 – 2015-11-15 (×4): 1 via ORAL
  Filled 2015-11-14 (×4): qty 1

## 2015-11-14 MED ORDER — FUROSEMIDE 20 MG PO TABS
20.0000 mg | ORAL_TABLET | Freq: Every day | ORAL | Status: DC
  Administered 2015-11-14 – 2015-11-15 (×2): 20 mg via ORAL
  Filled 2015-11-14 (×2): qty 1

## 2015-11-14 MED ORDER — RAMELTEON 8 MG PO TABS
8.0000 mg | ORAL_TABLET | Freq: Every day | ORAL | Status: DC
  Administered 2015-11-14: 8 mg via ORAL
  Filled 2015-11-14: qty 1

## 2015-11-14 MED ORDER — RAMELTEON 8 MG PO TABS
8.0000 mg | ORAL_TABLET | Freq: Every day | ORAL | Status: DC
  Filled 2015-11-14: qty 1

## 2015-11-14 MED ORDER — CALCIUM CARBONATE-VITAMIN D 500-200 MG-UNIT PO TABS
1.0000 | ORAL_TABLET | Freq: Every day | ORAL | Status: DC
  Administered 2015-11-15: 1 via ORAL
  Filled 2015-11-14: qty 1

## 2015-11-14 NOTE — Progress Notes (Signed)
SUBJECTIVE:  He denies any chest pain or SOB.   PHYSICAL EXAM Filed Vitals:   11/13/15 0653 11/13/15 1246 11/13/15 2122 11/14/15 0618  BP: 160/84 145/62 152/63 166/74  Pulse: 66 75 67 78  Temp: 98.5 F (36.9 C) 97.7 F (36.5 C) 98.2 F (36.8 C) 98 F (36.7 C)  TempSrc: Oral Oral Oral Oral  Resp: 18 18 18 18   Height:      Weight:    143 lb 3.2 oz (64.955 kg)  SpO2: 100% 95% 99% 99%   General:  No distress Lungs:  Clear Heart:  RRR Abdomen:  Positive bowel sounds, no rebound no guarding Extremities:  No edema   LABS: Lab Results  Component Value Date   TROPONINI 0.04* 11/12/2015   Results for orders placed or performed during the hospital encounter of 11/12/15 (from the past 24 hour(s))  Basic metabolic panel     Status: Abnormal   Collection Time: 11/13/15  9:17 AM  Result Value Ref Range   Sodium 144 135 - 145 mmol/L   Potassium 3.5 3.5 - 5.1 mmol/L   Chloride 106 101 - 111 mmol/L   CO2 26 22 - 32 mmol/L   Glucose, Bld 95 65 - 99 mg/dL   BUN 47 (H) 6 - 20 mg/dL   Creatinine, Ser 3.08 (H) 0.61 - 1.24 mg/dL   Calcium 9.2 8.9 - 10.3 mg/dL   GFR calc non Af Amer 16 (L) >60 mL/min   GFR calc Af Amer 19 (L) >60 mL/min   Anion gap 12 5 - 15  Glucose, capillary     Status: None   Collection Time: 11/13/15 11:08 AM  Result Value Ref Range   Glucose-Capillary 91 65 - 99 mg/dL  Glucose, capillary     Status: Abnormal   Collection Time: 11/13/15  4:16 PM  Result Value Ref Range   Glucose-Capillary 169 (H) 65 - 99 mg/dL  Glucose, capillary     Status: Abnormal   Collection Time: 11/13/15  9:21 PM  Result Value Ref Range   Glucose-Capillary 144 (H) 65 - 99 mg/dL   Comment 1 Notify RN    Comment 2 Document in Chart   Glucose, capillary     Status: Abnormal   Collection Time: 11/14/15 12:13 AM  Result Value Ref Range   Glucose-Capillary 186 (H) 65 - 99 mg/dL   Comment 1 Notify RN    Comment 2 Document in Chart   Protime-INR     Status: Abnormal   Collection  Time: 11/14/15  4:53 AM  Result Value Ref Range   Prothrombin Time 22.9 (H) 11.6 - 15.2 seconds   INR 2.03 (H) 0.00 - 99991111  Basic metabolic panel     Status: Abnormal   Collection Time: 11/14/15  4:53 AM  Result Value Ref Range   Sodium 144 135 - 145 mmol/L   Potassium 3.6 3.5 - 5.1 mmol/L   Chloride 105 101 - 111 mmol/L   CO2 28 22 - 32 mmol/L   Glucose, Bld 160 (H) 65 - 99 mg/dL   BUN 50 (H) 6 - 20 mg/dL   Creatinine, Ser 3.20 (H) 0.61 - 1.24 mg/dL   Calcium 9.0 8.9 - 10.3 mg/dL   GFR calc non Af Amer 16 (L) >60 mL/min   GFR calc Af Amer 18 (L) >60 mL/min   Anion gap 11 5 - 15  Glucose, capillary     Status: Abnormal   Collection Time: 11/14/15  6:13 AM  Result Value Ref Range   Glucose-Capillary 145 (H) 65 - 99 mg/dL    Intake/Output Summary (Last 24 hours) at 11/14/15 0738 Last data filed at 11/14/15 [id number]  Gross per 24 hour  Intake   1584 ml  Output   2825 ml  Net  -1241 ml     ASSESSMENT AND PLAN:  ACUTE ON CHRONIC SYSTOLIC/DIASTOLIC HEART FAILURE:    Seems to be euvolemic.  I will stop the IV Lasix and resume previous PO.   He is off ACE inhibitor/ARB with his increasing creat.  I will start Bidil given his hypertension.    Minus Breeding 11/14/2015 7:38 AM

## 2015-11-14 NOTE — Progress Notes (Signed)
ANTICOAGULATION CONSULT NOTE - Follow Up Consult  Pharmacy Consult for Coumadin Indication: atrial fibrillation  Allergies  Allergen Reactions  . Iodinated Diagnostic Agents Anaphylaxis  . Iohexol Anaphylaxis     Desc: RN states pt stated he had anyphalactic shock reaction to contrast approx 10 years ago.   . Ace Inhibitors Other (See Comments)    UNKNOWN  . Simvastatin Other (See Comments)    Muscle aches  . Aspirin     Other reaction(s): Abdominal Pain    Patient Measurements: Height: 5\' 10"  (177.8 cm) Weight: 143 lb 3.2 oz (64.955 kg) (scalel c) IBW/kg (Calculated) : 73  Vital Signs: Temp: 98 F (36.7 C) (04/14 0618) Temp Source: Oral (04/14 0618) BP: 166/74 mmHg (04/14 0618) Pulse Rate: 78 (04/14 0618)  Labs:  Recent Labs  11/12/15 1103 11/13/15 0538 11/13/15 0917 11/14/15 0453  HGB 10.2* 9.7*  --   --   HCT 29.7* 28.4*  --   --   PLT 153 148*  --   --   LABPROT 23.9* 24.0*  --  22.9*  INR 2.16* 2.18*  --  2.03*  CREATININE 3.13*  --  3.08* 3.20*  TROPONINI 0.04*  --   --   --     Estimated Creatinine Clearance: 14.1 mL/min (by C-G formula based on Cr of 3.2).  Assessment:   Continues on Coumadin for atrial fibrillation as prior to admission.   INR remains therapeutic on home Coumadin regimen.  Goal of Therapy:  INR 2-3 Monitor platelets by anticoagulation protocol: Yes   Plan:  Continue Coumadin 4 mg daily except 8 mg on Wednesdays. Daily PT/INR  Monitor for s/sx of bleeding  Levester Fresh, PharmD, BCPS, [facility] Clinical Pharmacist Pager [phone] 11/14/2015 9:39 AM

## 2015-11-14 NOTE — Progress Notes (Signed)
Echocardiogram 2D Echocardiogram has been performed.  [name] 11/14/2015, 3:29 PM

## 2015-11-14 NOTE — Care Management Important Message (Signed)
Important Message  Patient Details  Name: Paul HOGWOOD, MD MRN: [id number] Date of Birth: 01-Dec-1922   Medicare Important Message Given:  Yes    Nathen May 11/14/2015, 11:12 AM

## 2015-11-14 NOTE — Progress Notes (Signed)
Pt confused, pulled condom cath off and refusing new condom cath.

## 2015-11-14 NOTE — Progress Notes (Signed)
Subjective: He received benadryl last night to help him sleep and unfortunately became confused and pulled off his condom cath. He is doing better this morning. He has no complaints, eating breakfast.   Objective: Vital signs in last 24 hours: Filed Vitals:   11/13/15 0653 11/13/15 1246 11/13/15 2122 11/14/15 0618  BP: 160/84 145/62 152/63 166/74  Pulse: 66 75 67 78  Temp: 98.5 F (36.9 C) 97.7 F (36.5 C) 98.2 F (36.8 C) 98 F (36.7 C)  TempSrc: Oral Oral Oral Oral  Resp: 18 18 18 18   Height:      Weight:    143 lb 3.2 oz (64.955 kg)  SpO2: 100% 95% 99% 99%   Weight change: -4 lb 11.2 oz (-2.132 kg)  Intake/Output Summary (Last 24 hours) at 11/14/15 [id number] Last data filed at 11/14/15 [id number]  Gross per 24 hour  Intake   1824 ml  Output   2425 ml  Net   -601 ml   Physical Exam: General: elderly man sitting up in bed eating breakfast, pleasant, NAD HEENT: [place]/AT, EOMI, sclera anicteric, mucus membranes moist  Neck: no JVD appreciated  Lungs: CTA bilaterally, breaths non-labored  Heart: RRR, no m/g/r Abdomen: BS+, soft, non-tender  Extremities: RLE with 1+ pitting edema. No edema in LLE.  Neurologic: alert and oriented x 2, no focal deficits    Lab Results: Basic Metabolic Panel:  Recent Labs Lab 11/12/15 1103 11/13/15 0917 11/14/15 0453  NA 142 144 144  K 3.7 3.5 3.6  CL 107 106 105  CO2 23 26 28   GLUCOSE 212* 95 160*  BUN 47* 47* 50*  CREATININE 3.13* 3.08* 3.20*  CALCIUM 9.4 9.2 9.0  MG 2.0  --   --   PHOS 4.0  --   --    Liver Function Tests:  Recent Labs Lab 11/12/15 1103  AST 34  ALT 62  ALKPHOS 113  BILITOT 0.6  PROT 6.2*  ALBUMIN 3.3*   CBC:  Recent Labs Lab 11/12/15 1103 11/13/15 0538  WBC 3.9* 4.2  NEUTROABS 2.8  --   HGB 10.2* 9.7*  HCT 29.7* 28.4*  MCV 89.5 89.6  PLT 153 148*   Cardiac Enzymes:  Recent Labs Lab 11/12/15 1103  TROPONINI 0.04*   CBG:  Recent Labs Lab 11/13/15 0653 11/13/15 1108 11/13/15 1616  11/13/15 2121 11/14/15 0013 11/14/15 0613  GLUCAP 132* 91 169* 144* 186* 145*   Coagulation:  Recent Labs Lab 11/12/15 1103 11/13/15 0538 11/14/15 0453  LABPROT 23.9* 24.0* 22.9*  INR 2.16* 2.18* 2.03*   Anemia Panel:  Recent Labs Lab 11/13/15 0538  FERRITIN 39   Urinalysis:  Recent Labs Lab 11/12/15 1106  COLORURINE YELLOW  LABSPEC 1.012  PHURINE 6.0  GLUCOSEU NEGATIVE  HGBUR NEGATIVE  BILIRUBINUR NEGATIVE  KETONESUR NEGATIVE  PROTEINUR NEGATIVE  NITRITE NEGATIVE  LEUKOCYTESUR NEGATIVE     Micro Results: Recent Results (from the past 240 hour(s))  Urine culture     Status: None (Preliminary result)   Collection Time: 11/12/15 11:06 AM  Result Value Ref Range Status   Specimen Description URINE, CLEAN CATCH  Final   Special Requests NONE  Final   Culture TOO YOUNG TO READ  Final   Report Status PENDING  Incomplete   Studies/Results: Dg Chest 2 View  11/12/2015  CLINICAL DATA:  Generalized weakness beginning earlier today. EXAM: CHEST  2 VIEW COMPARISON:  12/29/2012. FINDINGS: Cardiomegaly. Previous CABG. Dual lead pacer. BILATERAL effusions, lower lobe opacity, interstitial prominence consistent with  early CHF. Worsening aeration from priors. No pneumothorax. IMPRESSION: Early CHF. Electronically Signed   By: Staci Righter M.D.   On: 11/12/2015 12:15   Ct Head Wo Contrast  11/12/2015  CLINICAL DATA:  82 year old male with multiple falls, generalized weakness. Initial encounter. EXAM: CT HEAD WITHOUT CONTRAST TECHNIQUE: Contiguous axial images were obtained from the base of the skull through the vertex without intravenous contrast. COMPARISON:  Head CT without contrast 09/14/2011. FINDINGS: Mild sphenoid and ethmoid sinus mucosal thickening is new. Other Visualized paranasal sinuses and mastoids are clear. No acute scalp or orbits soft tissue findings. No acute osseous abnormality identified. Calcified atherosclerosis at the skull base. Stable cerebral volume.  Patchy and confluent bilateral white matter hypodensity appears stable. No midline shift, mass effect, or evidence of intracranial mass lesion. Stable ventricle size and configuration. No acute intracranial hemorrhage identified. No cortically based acute infarct identified. Chronic linear right cerebellar are lacunar infarct, and probable bilateral thalamic lacunar infarcts are stable. No suspicious intracranial vascular hyperdensity. IMPRESSION: 1. Stable appearance of chronic small vessel disease since 2013. No acute intracranial abnormality. 2. New mild sphenoid and ethmoid sinus inflammation. Electronically Signed   By: Genevie Ann M.D.   On: 11/12/2015 15:14   Medications: I have reviewed the patient's current medications. Scheduled Meds: . amLODipine  10 mg Oral Daily  . atenolol  50 mg Oral BID  . calcium-vitamin D  1 tablet Oral Q breakfast  . donepezil  5 mg Oral QHS  . feeding supplement (GLUCERNA SHAKE)  237 mL Oral TID BM  . furosemide  20 mg Oral Daily  . insulin aspart  0-5 Units Subcutaneous QHS  . insulin aspart  0-9 Units Subcutaneous TID WC  . insulin detemir  10 Units Subcutaneous QHS  . isosorbide-hydrALAZINE  1 tablet Oral TID  . sodium chloride flush  3 mL Intravenous Q12H  . warfarin  4 mg Oral Once per day on Sun Mon Tue Wed Thu Fri  . [START ON 11/15/2015] warfarin  8 mg Oral Once per day on Sat  . Warfarin - Pharmacist Dosing Inpatient   Does not apply q1800   Continuous Infusions:  PRN Meds:.diphenhydrAMINE, senna   Assessment/Plan: [name] is a 82 yo man w/ PMHx of HTN, DM type II, CKD, CAD s/p CABG (1991), s/p PPM placement, and dementia, admitted for weakness and suspected CHF exacerbation.   Acute on Chronic sCHF: Doing quite well today. Diuresed ~2.8L in last 24 hours. Cr slightly bumped to 3.2 from 3.08. Weight down ~3 lb. Lungs clear, no SOB, chest pain. No JVD. Only mild edema in the legs R>L (chronic). Cardiology was consulted per family request. Agree  with Cards to stop diuresis today as he appears euvolemic.  - Resume home Lasix dose, 20 mg daily  - Start Bidil 20-37.5 mg TID per cards - Cards following, appreciate recommendations - Continue Atenolol - Continue to hold home Olmesartan given labile Cr currently - Will need outpatient cards follow up with Dr. Tamala Julian   AKI on CKD Stage IV: Baseline of ~2.3 in December 2016, now 3.08>3.2.Likely due to diuresis. Will decrease lasix to home dose.  - Lasix as above.  - Hold ARB  DM Type II: Last HbA1c 7.3.Recheck here 6.8. Blood sugars controlled here in hospital. - Hold linagliptin - Continue Levemir 10 units qhs - ISS  HTN: BP 166/74.Currently well controlled on home medications. - Continue home atenolol, amlodipine - Hold ARB - Start Bidil as above   Vascular Dementia:  Stable. PT recommends HH PT.  - Continue home donepezil - Assess for falls risk  Normocytic Anemia: Ferritin 39, suggestive of IDA.  - Start ferrous sulfate 325 mg daily on discharge.   Paroxysmal A-fib: Currently in sinus rhythm. He is on Atenolol and anticoagulated with coumadin at home. Currently therapeutic w/ INR of 2.0. - Continue Atenolol  - Continue warfarin - Check INR daily  Diet: Heart healthy  DVT/PE PPx: Coumadin  Dispo: Discharge possibly today after echo  The patient does have a current PCP [name], MD) and does need an [facility] hospital follow-up appointment after discharge.  The patient does not have transportation limitations that hinder transportation to clinic appointments.  .Services Needed at time of discharge: Y = Yes, Blank = No PT:   OT:   RN:   Equipment:   Other:     LOS: 2 days   Juliet Rude, MD 11/14/2015, 9:18 AM

## 2015-11-14 NOTE — Care Management Note (Signed)
Case Management Note  Patient Details  Name: Paul COTA, MD MRN: [id number] Date of Birth: 11-26-1922  Subjective/Objective:        Admitted with CHF            Action/Plan: Patient lives at home with spouse, has private caregivers with [employer] for 3 hrs everyday; Patient could benefit from [facility] services, CM talked to spouse and daughter for [facility] choices, spouse requested [facility] for HHPT; Butch Penny with [facility] made aware. Attending MD at discharge please enter the face to face document in Epic for [facility] services per Medicare guidelines.  Expected Discharge Date:      Possibly 11/14/2015            Expected Discharge Plan:  [place]  Discharge planning Services  CM Consult Choice offered to:  Spouse, Adult Children  HH Arranged:  PT Equ[employer] Agency:  [facility]  Status of Service:  In process, will continue to follow  Sherrilyn Rist [phone] 11/14/2015, 11:03 AM

## 2015-11-15 ENCOUNTER — Telehealth: Payer: Self-pay | Admitting: Interventional Cardiology

## 2015-11-15 DIAGNOSIS — I5023 Acute on chronic systolic (congestive) heart failure: Secondary | ICD-10-CM

## 2015-11-15 DIAGNOSIS — I442 Atrioventricular block, complete: Secondary | ICD-10-CM

## 2015-11-15 DIAGNOSIS — Z950 Presence of cardiac pacemaker: Secondary | ICD-10-CM

## 2015-11-15 LAB — GLUCOSE, CAPILLARY
GLUCOSE-CAPILLARY: 89 mg/dL (ref 65–99)
Glucose-Capillary: 183 mg/dL — ABNORMAL HIGH (ref 65–99)

## 2015-11-15 LAB — PROTIME-INR
INR: 1.83 — ABNORMAL HIGH (ref 0.00–1.49)
Prothrombin Time: 21.1 seconds — ABNORMAL HIGH (ref 11.6–15.2)

## 2015-11-15 MED ORDER — FERROUS SULFATE 325 (65 FE) MG PO TABS: 325.0000 mg | ORAL_TABLET | Freq: Every day | ORAL | Status: DC

## 2015-11-15 MED ORDER — ISOSORB DINITRATE-HYDRALAZINE 20-37.5 MG PO TABS: 1.0000 | ORAL_TABLET | Freq: Three times a day (TID) | ORAL | Status: AC

## 2015-11-15 MED ORDER — INSULIN DETEMIR 100 UNIT/ML FLEXPEN: 10.0000 [IU] | PEN_INJECTOR | Freq: Every day | SUBCUTANEOUS | Status: DC

## 2015-11-15 MED ORDER — FUROSEMIDE 20 MG PO TABS: 20.0000 mg | ORAL_TABLET | Freq: Every day | ORAL | Status: DC

## 2015-11-15 MED ORDER — RAMELTEON 8 MG PO TABS: 8.0000 mg | ORAL_TABLET | Freq: Every day | ORAL | Status: DC

## 2015-11-15 NOTE — Progress Notes (Signed)
Subjective: No acute events overnight. He is doing great this morning. Denies any chest pain or SOB. He is enjoying his breakfast.  Objective: Vital signs in last 24 hours: Filed Vitals:   11/14/15 1000 11/14/15 1128 11/14/15 2050 11/15/15 0854  BP: 150/69 125/49 140/55 141/65  Pulse: 60 66 64 68  Temp: 97.9 F (36.6 C) 98.2 F (36.8 C) 98.9 F (37.2 C) 97.8 F (36.6 C)  TempSrc: Oral Oral Oral Oral  Resp: 18 18 20 18   Height:      Weight:      SpO2: 100% 100% 100% 100%   Weight change:   Intake/Output Summary (Last 24 hours) at 11/15/15 1042 Last data filed at 11/15/15 0900  Gross per 24 hour  Intake   1012 ml  Output   1050 ml  Net    -38 ml   Physical Exam: General: elderly man sitting up in bed eating breakfast, pleasant, NAD HEENT: [place]/AT, EOMI, sclera anicteric, mucus membranes moist  Neck: no JVD appreciated  Lungs: CTA bilaterally, breaths non-labored  Heart: RRR, no m/g/r Abdomen: BS+, soft, non-tender  Extremities: No peripheral edema  Neurologic: alert and oriented x 2, no focal deficits    Lab Results: Basic Metabolic Panel:  Recent Labs Lab 11/12/15 1103 11/13/15 0917 11/14/15 0453  NA 142 144 144  K 3.7 3.5 3.6  CL 107 106 105  CO2 23 26 28   GLUCOSE 212* 95 160*  BUN 47* 47* 50*  CREATININE 3.13* 3.08* 3.20*  CALCIUM 9.4 9.2 9.0  MG 2.0  --   --   PHOS 4.0  --   --    Liver Function Tests:  Recent Labs Lab 11/12/15 1103  AST 34  ALT 62  ALKPHOS 113  BILITOT 0.6  PROT 6.2*  ALBUMIN 3.3*   CBC:  Recent Labs Lab 11/12/15 1103 11/13/15 0538  WBC 3.9* 4.2  NEUTROABS 2.8  --   HGB 10.2* 9.7*  HCT 29.7* 28.4*  MCV 89.5 89.6  PLT 153 148*   Cardiac Enzymes:  Recent Labs Lab 11/12/15 1103  TROPONINI 0.04*   CBG:  Recent Labs Lab 11/14/15 0013 11/14/15 0613 11/14/15 1130 11/14/15 1707 11/14/15 2106 11/15/15 0617  GLUCAP 186* 145* 124* 155* 118* 89   Coagulation:  Recent Labs Lab 11/12/15 1103  11/13/15 0538 11/14/15 0453 11/15/15 0323  LABPROT 23.9* 24.0* 22.9* 21.1*  INR 2.16* 2.18* 2.03* 1.83*   Anemia Panel:  Recent Labs Lab 11/13/15 0538  FERRITIN 39   Urinalysis:  Recent Labs Lab 11/12/15 1106  COLORURINE YELLOW  LABSPEC 1.012  PHURINE 6.0  GLUCOSEU NEGATIVE  HGBUR NEGATIVE  BILIRUBINUR NEGATIVE  KETONESUR NEGATIVE  PROTEINUR NEGATIVE  NITRITE NEGATIVE  LEUKOCYTESUR NEGATIVE     Micro Results: Recent Results (from the past 240 hour(s))  Urine culture     Status: Abnormal   Collection Time: 11/12/15 11:06 AM  Result Value Ref Range Status   Specimen Description URINE, CLEAN CATCH  Final   Special Requests NONE  Final   Culture 2,000 COLONIES/mL INSIGNIFICANT GROWTH (A)  Final   Report Status 11/14/2015 FINAL  Final   Medications: I have reviewed the patient's current medications. Scheduled Meds: . amLODipine  10 mg Oral Daily  . atenolol  50 mg Oral BID  . calcium-vitamin D  1 tablet Oral Q breakfast  . donepezil  5 mg Oral QHS  . feeding supplement (GLUCERNA SHAKE)  237 mL Oral TID BM  . furosemide  20 mg Oral  Daily  . insulin aspart  0-5 Units Subcutaneous QHS  . insulin aspart  0-9 Units Subcutaneous TID WC  . insulin detemir  10 Units Subcutaneous QHS  . isosorbide-hydrALAZINE  1 tablet Oral TID  . ramelteon  8 mg Oral QHS  . sodium chloride flush  3 mL Intravenous Q12H  . warfarin  4 mg Oral Once per day on Sun Mon Tue Wed Thu Fri  . warfarin  8 mg Oral Once per day on Sat  . Warfarin - Pharmacist Dosing Inpatient   Does not apply q1800   Continuous Infusions:  PRN Meds:.senna   Assessment/Plan: Mr. Riebel is a 82 yo man w/ PMHx of HTN, DM type II, CKD, CAD s/p CABG (1991), s/p PPM placement, and dementia, admitted for weakness and suspected CHF exacerbation.   Acute on Chronic sCHF: Doing great today. Diuresed ~1.2L in last 24 hours. Weight down ~4 lbs since admission. Lungs clear, no SOB, chest pain. No JVD. Only mild edema in  the legs R>L (chronic). He is euvolemic and back on his home dose of Lasix.  - Discharge on home Lasix 20 mg daily  - Continue Bidil 20-37.5 mg TID  - Cards following, appreciate recommendations - Continue Atenolol - Continue to hold home Olmesartan given labile Cr currently - Will need outpatient cards follow up with Dr. Tamala Julian  - Cards to interrogate pacemaker prior to discharge    AKI on CKD Stage IV: Baseline of ~2.3 in December 2016, now 3.08>3.2.Likely due to diuresis. His Lasix was decreased to his home dose. He will need a repeat bmet as an outpatient. - Lasix as above.  - Hold ARB  DM Type II: Last HbA1c 7.3.Recheck here 6.8. Blood sugars controlled here in hospital. - Hold linagliptin - Continue Levemir 10 units qhs - ISS  HTN: BP 166/74.Currently well controlled on home medications. - Continue home atenolol, amlodipine - Hold ARB - Continue Bidil as above   Vascular Dementia: Stable. PT recommends HH PT.  - Continue home donepezil - HH PT ordered   Normocytic Anemia: Ferritin 39, suggestive of IDA.  - Start ferrous sulfate 325 mg daily on discharge.   Paroxysmal A-fib: Currently in sinus rhythm. He is on Atenolol and anticoagulated with coumadin at home. Currently therapeutic w/ INR of 2.0. - Continue Atenolol  - Continue warfarin - Check INR daily  Diet: Heart healthy  DVT/PE PPx: Coumadin  Dispo: Discharge today after PM interrogation  The patient does have a current PCP [name], MD) and does need an [facility] hospital follow-up appointment after discharge.  The patient does not have transportation limitations that hinder transportation to clinic appointments.  .Services Needed at time of discharge: Y = Yes, Blank = No PT:   OT:   RN:   Equipment:   Other:     LOS: 3 days   Juliet Rude, MD 11/15/2015, 10:42 AM

## 2015-11-15 NOTE — Progress Notes (Signed)
Patient ID: Paul Rounds, [name], male   DOB: 1923/07/17, 82 y.o.   MRN: [id number]    Patient Name: Paul Rounds, [name] Date of Encounter: 11/15/2015     Principal Problem:   Acute exacerbation of CHF (congestive heart failure) ([place]) Active Problems:   Pacemaker   HTN (hypertension)   Hyperlipidemia   CAD (coronary artery disease)   DM (diabetes mellitus) ([place])   CKD (chronic kidney disease)   Dementia   Atrial fibrillation (HCC)   Chronic anticoagulation   Heart failure with reduced ejection fraction (HCC)   Acute on chronic renal failure (HCC)   Chronic systolic CHF (congestive heart failure) (HCC)    SUBJECTIVE  No chest pain or sob. He thinks he is back to baseline. Family concerned that his PPM has not been checked.  CURRENT MEDS . amLODipine  10 mg Oral Daily  . atenolol  50 mg Oral BID  . calcium-vitamin D  1 tablet Oral Q breakfast  . donepezil  5 mg Oral QHS  . feeding supplement (GLUCERNA SHAKE)  237 mL Oral TID BM  . furosemide  20 mg Oral Daily  . insulin aspart  0-5 Units Subcutaneous QHS  . insulin aspart  0-9 Units Subcutaneous TID WC  . insulin detemir  10 Units Subcutaneous QHS  . isosorbide-hydrALAZINE  1 tablet Oral TID  . ramelteon  8 mg Oral QHS  . sodium chloride flush  3 mL Intravenous Q12H  . warfarin  4 mg Oral Once per day on Sun Mon Tue Wed Thu Fri  . warfarin  8 mg Oral Once per day on Sat  . Warfarin - Pharmacist Dosing Inpatient   Does not apply q1800    OBJECTIVE  Filed Vitals:   11/14/15 1000 11/14/15 1128 11/14/15 2050 11/15/15 0854  BP: 150/69 125/49 140/55 141/65  Pulse: 60 66 64 68  Temp: 97.9 F (36.6 C) 98.2 F (36.8 C) 98.9 F (37.2 C) 97.8 F (36.6 C)  TempSrc: Oral Oral Oral Oral  Resp: 18 18 20 18   Height:      Weight:      SpO2: 100% 100% 100% 100%    Intake/Output Summary (Last 24 hours) at 11/15/15 1038 Last data filed at 11/15/15 0900  Gross per 24 hour  Intake   1012 ml  Output   1050 ml  Net     -38 ml   Filed Weights   11/12/15 1707 11/13/15 0600 11/14/15 0618  Weight: 147 lb 14.4 oz (67.087 kg) 146 lb 12.8 oz (66.588 kg) 143 lb 3.2 oz (64.955 kg)    PHYSICAL EXAM  General: Pleasant, NAD. Neuro: Alert and oriented X 3. Moves all extremities spontaneously. Psych: Normal affect. HEENT:  Normal  Neck: Supple without bruits or JVD. Lungs:  Resp regular and unlabored, CTA. Heart: RRR no s3, s4, or murmurs. Abdomen: Soft, non-tender, non-distended, BS + x 4.  Extremities: No clubbing, cyanosis or edema. DP/PT/Radials 2+ and equal bilaterally.  Accessory Clinical Findings  CBC  Recent Labs  11/12/15 1103 11/13/15 0538  WBC 3.9* 4.2  NEUTROABS 2.8  --   HGB 10.2* 9.7*  HCT 29.7* 28.4*  MCV 89.5 89.6  PLT 153 123456*   Basic Metabolic Panel  Recent Labs  11/12/15 1103 11/13/15 0917 11/14/15 0453  NA 142 144 144  K 3.7 3.5 3.6  CL 107 106 105  CO2 23 26 28   GLUCOSE 212* 95 160*  BUN 47* 47* 50*  CREATININE 3.13* 3.08* 3.20*  CALCIUM 9.4 9.2 9.0  MG 2.0  --   --   PHOS 4.0  --   --    Liver Function Tests  Recent Labs  11/12/15 1103  AST 34  ALT 62  ALKPHOS 113  BILITOT 0.6  PROT 6.2*  ALBUMIN 3.3*   No results for input(s): LIPASE, AMYLASE in the last 72 hours. Cardiac Enzymes  Recent Labs  11/12/15 1103  TROPONINI 0.04*   BNP Invalid input(s): POCBNP D-Dimer No results for input(s): DDIMER in the last 72 hours. Hemoglobin A1C  Recent Labs  11/13/15 0538  HGBA1C 6.8*   Fasting Lipid Panel No results for input(s): CHOL, HDL, LDLCALC, TRIG, CHOLHDL, LDLDIRECT in the last 72 hours. Thyroid Function Tests No results for input(s): TSH, T4TOTAL, T3FREE, THYROIDAB in the last 72 hours.  Invalid input(s): FREET3  TELE  AV pacing  Radiology/Studies  Dg Chest 2 View  11/12/2015  CLINICAL DATA:  Generalized weakness beginning earlier today. EXAM: CHEST  2 VIEW COMPARISON:  12/29/2012. FINDINGS: Cardiomegaly. Previous CABG. Dual lead  pacer. BILATERAL effusions, lower lobe opacity, interstitial prominence consistent with early CHF. Worsening aeration from priors. No pneumothorax. IMPRESSION: Early CHF. Electronically Signed   By: Staci Righter M.D.   On: 11/12/2015 12:15   Ct Head Wo Contrast  11/12/2015  CLINICAL DATA:  82 year old male with multiple falls, generalized weakness. Initial encounter. EXAM: CT HEAD WITHOUT CONTRAST TECHNIQUE: Contiguous axial images were obtained from the base of the skull through the vertex without intravenous contrast. COMPARISON:  Head CT without contrast 09/14/2011. FINDINGS: Mild sphenoid and ethmoid sinus mucosal thickening is new. Other Visualized paranasal sinuses and mastoids are clear. No acute scalp or orbits soft tissue findings. No acute osseous abnormality identified. Calcified atherosclerosis at the skull base. Stable cerebral volume. Patchy and confluent bilateral white matter hypodensity appears stable. No midline shift, mass effect, or evidence of intracranial mass lesion. Stable ventricle size and configuration. No acute intracranial hemorrhage identified. No cortically based acute infarct identified. Chronic linear right cerebellar are lacunar infarct, and probable bilateral thalamic lacunar infarcts are stable. No suspicious intracranial vascular hyperdensity. IMPRESSION: 1. Stable appearance of chronic small vessel disease since 2013. No acute intracranial abnormality. 2. New mild sphenoid and ethmoid sinus inflammation. Electronically Signed   By: Genevie Ann M.D.   On: 11/12/2015 15:14    ASSESSMENT AND PLAN  1. Acute on chronic systolic heart failure - he appears euvolemic. I think he is stable for DC home. 2. PPM - his device has not been interogated and his family are concerned. I will make this happen.  3. Acute on chronic renal failure - He does not have a nephrologist. Will likely need referral as an outpatient but I will defer to Dr. Tamala Julian.  Gregg Taylor,M.D.  Addendum: At  family's request, I have interogated his PPM. His Battery, leads and rhythm have all been stable. His PPM is working normally. He may be discharged home. See Dr. Cherlyn Cushing note from 4/14 as well. He will need followup with Dr. Tamala Julian. He will see me back in the office in 1-2 months for PPM followup.  Gregg Taylor,M.D.  11/15/2015 10:38 AM

## 2015-11-15 NOTE — Discharge Summary (Signed)
Name: Paul ASSENMACHER, [name] MRN: [id number] DOB: November 15, 1922 82 y.o. PCP: [name], [name]  Date of Admission: 11/12/2015 10:27 AM Date of Discharge: 11/15/2015 Attending Physician: [name], [name]  Discharge Diagnosis: Principal Problem Acute on Chronic Systolic CHF Active Problems AKI on CKD Stage IV Type 2 DM HTN Vascular Dementia Paroxysmal AFib  Discharge Medications:   Medication List    STOP taking these medications        olmesartan 40 MG tablet  Commonly known as:  BENICAR      TAKE these medications        ALIGN PO  Take 1 capsule by mouth daily.     amLODipine 10 MG tablet  Commonly known as:  NORVASC  Take 10 mg by mouth daily.     atenolol 50 MG tablet  Commonly known as:  TENORMIN  Take 50 mg by mouth 2 (two) times daily.     buPROPion 100 MG tablet  Commonly known as:  WELLBUTRIN  Take 1 tablet by mouth daily.     CALCIUM PO  Take 1 tablet by mouth daily.     COQ10 PO  Take 1 capsule by mouth daily.     donepezil 5 MG tablet  Commonly known as:  ARICEPT  Take 5 mg by mouth at bedtime.     feeding supplement (GLUCERNA SHAKE) Liqd  Take 237 mLs by mouth 3 (three) times daily between meals.     ferrous sulfate 325 (65 FE) MG tablet  Take 1 tablet (325 mg total) by mouth daily with breakfast.     FREESTYLE LITE TEST VI  USE AS DIRECTED     furosemide 20 MG tablet  Commonly known as:  LASIX  Take 1 tablet (20 mg total) by mouth daily.     ICAPS PO  Take 1 tablet by mouth 2 (two) times daily.     Insulin Detemir 100 UNIT/ML Pen  Commonly known as:  LEVEMIR FLEXPEN  Inject 10 Units into the skin at bedtime.     isosorbide-hydrALAZINE 20-37.5 MG tablet  Commonly known as:  BIDIL  Take 1 tablet by mouth 3 (three) times daily.     linagliptin 5 MG Tabs tablet  Commonly known as:  TRADJENTA  Take 7.5 mg by mouth daily.     ondansetron 4 MG tablet  Commonly known as:  ZOFRAN  Take 1 tablet (4 mg total) by mouth every 6  (six) hours as needed for nausea or vomiting.     polyethylene glycol packet  Commonly known as:  MIRALAX / GLYCOLAX  Take 17 g by mouth every other day.     ramelteon 8 MG tablet  Commonly known as:  ROZEREM  Take 1 tablet (8 mg total) by mouth at bedtime.     SENNA PO  Take 1 tablet by mouth daily.     sertraline 50 MG tablet  Commonly known as:  ZOLOFT  Take 50 mg by mouth at bedtime.     warfarin 4 MG tablet  Commonly known as:  COUMADIN  Take 4-8 mg by mouth daily. 4mg  daily except 8mg  on  Saturday        Disposition and follow-up:   Paul Charlett Nose, [name] was discharged from [facility] in Good condition.  At the hospital follow up visit please address:  1.  CHF: He was discharged on home dose Lasix 20 mg daily. Determine his volume status and if this needs readjustment.  AKI  on CKD: Cr 3.2 at time of discharge but thought to be slightly elevated due to diuresis. He will need a repeat bmet at his outpatient f/u visit. HTN: His ARB was held due to his AKI. Determine if this needs to be restarted. He was also discharged on Bidil.  Anemia: Started on iron supplementation for his IDA. Will need repeat CBC in 8 weeks.  2.  Labs / imaging needed at time of follow-up: CBC (wait 8 weeks), bmet  3.  Pending labs/ test needing follow-up: None  Follow-up Appointments:     Follow-up Information    Follow up with [facility].   Why:  They will do your home health physical therapy care at your home   Contact information:   [address] [place] [address] [phone]       Follow up with [name], [name].   Specialty:  Family Medicine   Why:  Please call Dr. Fredderick Phenix so that he can see you in the next 1-2 weeks   Contact information:   [address] [place] [address] [phone]       Follow up with Sinclair Grooms, [name].   Specialty:  Cardiology   Why:  We will call and schedule an appointment for you  on Monday. You will be contacted for the appointment time/date.   Contact information:   [id number] [place] [address] [phone]       Discharge Instructions: Discharge Instructions    Diet - low sodium heart healthy    Complete by:  As directed      Increase activity slowly    Complete by:  As directed            Consultations: Treatment Team:  Rounding Lbcardiology, [name]  Procedures Performed:  Dg Chest 2 View  11/12/2015  CLINICAL DATA:  Generalized weakness beginning earlier today. EXAM: CHEST  2 VIEW COMPARISON:  12/29/2012. FINDINGS: Cardiomegaly. Previous CABG. Dual lead pacer. BILATERAL effusions, lower lobe opacity, interstitial prominence consistent with early CHF. Worsening aeration from priors. No pneumothorax. IMPRESSION: Early CHF. Electronically Signed   By: Staci Righter M.D.   On: 11/12/2015 12:15   Ct Head Wo Contrast  11/12/2015  CLINICAL DATA:  82 year old male with multiple falls, generalized weakness. Initial encounter. EXAM: CT HEAD WITHOUT CONTRAST TECHNIQUE: Contiguous axial images were obtained from the base of the skull through the vertex without intravenous contrast. COMPARISON:  Head CT without contrast 09/14/2011. FINDINGS: Mild sphenoid and ethmoid sinus mucosal thickening is new. Other Visualized paranasal sinuses and mastoids are clear. No acute scalp or orbits soft tissue findings. No acute osseous abnormality identified. Calcified atherosclerosis at the skull base. Stable cerebral volume. Patchy and confluent bilateral white matter hypodensity appears stable. No midline shift, mass effect, or evidence of intracranial mass lesion. Stable ventricle size and configuration. No acute intracranial hemorrhage identified. No cortically based acute infarct identified. Chronic linear right cerebellar are lacunar infarct, and probable bilateral thalamic lacunar infarcts are stable. No suspicious intracranial vascular hyperdensity. IMPRESSION:  1. Stable appearance of chronic small vessel disease since 2013. No acute intracranial abnormality. 2. New mild sphenoid and ethmoid sinus inflammation. Electronically Signed   By: Genevie Ann M.D.   On: 11/12/2015 15:14    2D Echo:  11/14/15 Study Conclusions - Left ventricle: The cavity size was normal. Wall thickness was  increased in a pattern of mild LVH. Systolic function was mildly  to moderately  reduced. The estimated ejection fraction was in the  range of 40% to 45%. - Aortic valve: Severely calcified annulus. There was very mild  stenosis. Valve area (VTI): 1.97 cm^2. Valve area (Vmax): 2.02  cm^2. Valve area (Vmean): 2.08 cm^2. - Mitral valve: Moderately calcified annulus. There was moderate  regurgitation. - Left atrium: The atrium was moderately dilated. - Right atrium: The atrium was mildly dilated. - Tricuspid valve: There was moderate regurgitation. - Pulmonary arteries: Systolic pressure was moderately increased.  PA peak pressure: 49 mm Hg (S).  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 History was obtained from both the patient and his wife. He reports feeling dizzy this morning and "not feeling well" in general. His wife notes he has had increased lower extremity swelling eve despite seeing his primary care doctor and having increased dose of his Lasix. She reports his Lasix was increased from 20 mg daily to 40 mg daily. She also reports he has been having some shortness of breath. He denies orthopnea, PND, abdominal swelling, chest pain, cough, hemoptysis, headaches, changes in bowel or bladder function, changes in appetite (although wife endorses he has had a decreased appetite for the past 6-10 months), and changes in diet. He endorses heart palpitations but cannot characterize how different these are from his  baseline. His wife added that he had a glucose of 399 last night and she gave him 10 units of Levemir and this morning his glucose was in the low 200's. This is abnormal for him because lately she has not had to even give him any insulin because his glucose has been in the low 100s. His wife also reports that he often is dehydrated, not taking in enough fluids.  In the ED, his CXR showed interstitial infiltrates and bilateral pulmonary effusions. A CT head showed unchanged subcortical white matter vascular disease, and ethmoid and sphenoid sinus inflammation. He was given 40 mg IV Lasix.    Hospital Course by problem list:  Acute on Chronic Systolic HF Exacerbation: Mr. Buonomo presented with generalized weakness, increasing SOB, worsening lower extremity edema, and dizziness while standing. He was found to have an elevated BNP of 3000 and pulmonary edema on CXR consistent with an acute on chronic CHF exacerbation. He was treated with IV lasix and he improved clinically by the following day. His discharge weight was 143 lbs (admission wt 147 lbs). His Cr did bump from 3.0 on admission to 3.2 but this was attributed to diuresis. He was transitioned to Lasix 20 mg PO daily upon discharge. He will need a follow up bmet and assessment of his volume status at his outpatient visit.     Discharge Vitals:   BP 141/65 mmHg  Pulse 68  Temp(Src) 97.8 F (36.6 C) (Oral)  Resp 18  Ht 5\' 10"  (1.778 m)  Wt 143 lb 3.2 oz (64.955 kg)  BMI 20.55 kg/m2  SpO2 100% Physical Exam: General: elderly man sitting up in bed eating breakfast, pleasant, NAD HEENT: [place]/AT, EOMI, sclera anicteric, mucus membranes moist  Neck: no JVD appreciated  Lungs: CTA bilaterally, breaths non-labored  Heart: RRR, no m/g/r Abdomen: BS+, soft, non-tender  Extremities: No peripheral edema  Neurologic: alert and oriented x 2, no focal deficits   Discharge Labs:  Results for orders placed or performed during the hospital  encounter of 11/12/15 (from the  past 24 hour(s))  Glucose, capillary     Status: Abnormal   Collection Time: 11/14/15 11:30 AM  Result Value Ref Range   Glucose-Capillary 124 (H) 65 - 99 mg/dL  Glucose, capillary     Status: Abnormal   Collection Time: 11/14/15  5:07 PM  Result Value Ref Range   Glucose-Capillary 155 (H) 65 - 99 mg/dL  Glucose, capillary     Status: Abnormal   Collection Time: 11/14/15  9:06 PM  Result Value Ref Range   Glucose-Capillary 118 (H) 65 - 99 mg/dL  Protime-INR     Status: Abnormal   Collection Time: 11/15/15  3:23 AM  Result Value Ref Range   Prothrombin Time 21.1 (H) 11.6 - 15.2 seconds   INR 1.83 (H) 0.00 - 1.49  Glucose, capillary     Status: None   Collection Time: 11/15/15  6:17 AM  Result Value Ref Range   Glucose-Capillary 89 65 - 99 mg/dL    Signed: Juliet Rude, [name] 11/15/2015, 11:23 AM    Services Ordered on Discharge: Home health PT Equipment Ordered on Discharge: None

## 2015-11-15 NOTE — Progress Notes (Signed)
ANTICOAGULATION CONSULT NOTE - Follow Up Consult  Pharmacy Consult for Coumadin Indication: atrial fibrillation  Allergies  Allergen Reactions  . Iodinated Diagnostic Agents Anaphylaxis  . Iohexol Anaphylaxis     Desc: RN states pt stated he had anyphalactic shock reaction to contrast approx 10 years ago.   . Ace Inhibitors Other (See Comments)    UNKNOWN  . Simvastatin Other (See Comments)    Muscle aches  . Aspirin     Other reaction(s): Abdominal Pain    Patient Measurements: Height: 5\' 10"  (177.8 cm) Weight: 143 lb 3.2 oz (64.955 kg) (scalel c) IBW/kg (Calculated) : 73  Vital Signs: Temp: 97.8 F (36.6 C) (04/15 0854) Temp Source: Oral (04/15 0854) BP: 141/65 mmHg (04/15 0854) Pulse Rate: 68 (04/15 0854)  Labs:  Recent Labs  11/12/15 1103 11/13/15 0538 11/13/15 0917 11/14/15 0453 11/15/15 0323  HGB 10.2* 9.7*  --   --   --   HCT 29.7* 28.4*  --   --   --   PLT 153 148*  --   --   --   LABPROT 23.9* 24.0*  --  22.9* 21.1*  INR 2.16* 2.18*  --  2.03* 1.83*  CREATININE 3.13*  --  3.08* 3.20*  --   TROPONINI 0.04*  --   --   --   --     Estimated Creatinine Clearance: 14.1 mL/min (by C-G formula based on Cr of 3.2).  Assessment:   Continues on Coumadin for atrial fibrillation as prior to admission.   INR today SUBtherapeutic at 1.83. Continue with usual 8mg  dose tonight. H/H stable on 4/13. No bleeding noted   Goal of Therapy:  INR 2-3 Monitor platelets by anticoagulation protocol: Yes   Plan:  Coumadin 8mg  tonight Continue Coumadin 4 mg daily except 8 mg on Saturdays. Daily PT/INR  Monitor for s/sx of bleeding  Shamecka Hocutt C. Lennox Grumbles, PharmD Pharmacy Resident  Pager: [phone] 11/15/2015 9:53 AM

## 2015-11-15 NOTE — Telephone Encounter (Signed)
Instructed to take 40 mg of furosemide daily rather than 20 mg daily. Needs BMET done on Thursday or the next Monday. We need to coordinate with his home care service.

## 2015-11-15 NOTE — Progress Notes (Signed)
CM received call from RN and stated:  Our internal therapist recommended to the family Barnabas Lister from [facility] to be the pt's HHPT; and if Barnabas Lister is not available to request Matt from [place].  CM ALWAYS offers choice; makes the request for pt's specific PT  but we have no guarantee a vendor will honor a specific request, though the home health agencies do their best in honoring these requests.  This is explained by CM when a specific request is made.  We CANNNOT have backup plans with different vendors.  Weekend reps for vendors do not have access to specific scheduling.  We do not have access to outside vendors scheduling or covered geographics.  Family upset as they feel this was promised to them and this CM explained if they choose to terminate [facility] if Barnabas Lister is not available post discharge, they will have to call the ordering MDs office to rearrange a new agency. Pt and family verbalize understanding, and state they will keep their choice of [facility] and hope to "get Barnabas Lister."  Family requests I include [facility] contact information so they can call the ordering physician's office to have staff arrange [facility] if they choose to change their mind.  CM has placed a call to PT to request they NOT recommend outside vendor specific therapists unless they get verbal understanding from the pt  the home health agency will do their best to honor their request but it is dependent on availability and geographic coverage.

## 2015-11-17 NOTE — Telephone Encounter (Signed)
Returned call. Spoke with pt's wife. Adv her that pt post hospital f/u appt needs to be scheduled 7-10 after d/c from the hospital. They do not want pt seen by an extender and are requesting an appt with Dr.Smith only. Adv Mrs.Renken that Dr. Tamala Julian will be out of the office until late next week. I will need to fwd a update to him to adv if it would be ok for pt to wai to be seen. Mrs.Sistare sts that pt has been stable since d/c Mrs. Morson sts that [facility] is suppose to come out to their home later this week, but she has not heard back from them about what day. Adv her that Dr.Smith would like labs drawn when the come out to the pt's home later this week. I will call [facility] to initiate orders.   Adv Mrs. Melhorn that I will fwd Dr.Smith an update and call back with his response. She verbalized understanding.

## 2015-11-17 NOTE — Telephone Encounter (Signed)
I will see him on my next quarter day

## 2015-11-17 NOTE — Telephone Encounter (Signed)
New message:  Dr. Inetta Fermo called in stating that the pt needs to follow up with Dr. Tamala Julian NOT an APP within a week because the pt has CHF and needs labs ordered. Please f/u with her or Mattie(wife).

## 2015-11-18 NOTE — Telephone Encounter (Signed)
Decrease the furosemide to 20 mg daily

## 2015-11-18 NOTE — Telephone Encounter (Signed)
Spoke with pt daughter Dr.Gullick. She is a aware of pt f/u appt with Dr.Smith scheduled on 4/24 @ [facility] calls (Dr.Tripp's service) Spoke with [facility] They will go out to the pt's home this week to draw a bmet and cbc Results to be faxed to our office attn: Dr.Smith.  Dr.Radle reports that the pt voided 2 liters of urine on Sun 4/16. She would like to know the pt needs to continue Lasix 40mg  daily.  Adv her tha I will fwd an update to Dr.Smith and call back with his response

## 2015-11-18 NOTE — Telephone Encounter (Signed)
Pt wife aware of Dr.Smith's response. Decrease furosemide to 20mg  daily. Mrs. [name] verbalized understanding

## 2015-11-20 LAB — PROTIME-INR: INR: 1.8 — AB (ref 0.9–1.1)

## 2015-11-21 ENCOUNTER — Ambulatory Visit (INDEPENDENT_AMBULATORY_CARE_PROVIDER_SITE_OTHER): Payer: Medicare Other | Admitting: Cardiology

## 2015-11-21 ENCOUNTER — Encounter: Payer: Self-pay | Admitting: Interventional Cardiology

## 2015-11-21 DIAGNOSIS — Z5181 Encounter for therapeutic drug level monitoring: Secondary | ICD-10-CM

## 2015-11-21 DIAGNOSIS — I480 Paroxysmal atrial fibrillation: Secondary | ICD-10-CM

## 2015-11-24 ENCOUNTER — Encounter: Payer: Self-pay | Admitting: Interventional Cardiology

## 2015-11-24 ENCOUNTER — Ambulatory Visit (INDEPENDENT_AMBULATORY_CARE_PROVIDER_SITE_OTHER): Payer: Medicare Other | Admitting: Interventional Cardiology

## 2015-11-24 VITALS — BP 160/84 | HR 88 | Ht 70.0 in | Wt 149.1 lb

## 2015-11-24 DIAGNOSIS — I480 Paroxysmal atrial fibrillation: Secondary | ICD-10-CM

## 2015-11-24 DIAGNOSIS — Z7901 Long term (current) use of anticoagulants: Secondary | ICD-10-CM

## 2015-11-24 DIAGNOSIS — I5043 Acute on chronic combined systolic (congestive) and diastolic (congestive) heart failure: Secondary | ICD-10-CM | POA: Diagnosis not present

## 2015-11-24 DIAGNOSIS — I10 Essential (primary) hypertension: Secondary | ICD-10-CM

## 2015-11-24 DIAGNOSIS — N183 Chronic kidney disease, stage 3 (moderate): Secondary | ICD-10-CM

## 2015-11-24 DIAGNOSIS — I2510 Atherosclerotic heart disease of native coronary artery without angina pectoris: Secondary | ICD-10-CM

## 2015-11-24 DIAGNOSIS — F0390 Unspecified dementia without behavioral disturbance: Secondary | ICD-10-CM

## 2015-11-24 NOTE — Patient Instructions (Signed)
Medication Instructions:  Your physician recommends that you continue on your current medications as directed. Please refer to the Current Medication list given to you today.  Labwork: None ordered.  Testing/Procedures: None ordered.  Follow-Up: Your physician recommends that you schedule a follow-up appointment as needed.   Any Other Special Instructions Will Be Listed Below (If Applicable).     If you need a refill on your cardiac medications before your next appointment, please call your pharmacy.   

## 2015-11-24 NOTE — Progress Notes (Signed)
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 Chronic kidney disease stage IV.  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 He has been recently admitted with diastolic/systolic acute on chronic heart failure. Diuresis was effective. Weight is now down to 145 pounds. When he arrived home from the hospital and was 148 pounds on his home scales. Daughter and Mrs. 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 When Mrs. Newmann is around he complains incessantly about not feeling well. He denies dyspnea, chest pain, and palpitations at this time.    Past Medical History  Diagnosis Date  . Diabetes mellitus   . Tachycardia-bradycardia syndrome ([place])   . Left leg DVT ([place])   . CKD (chronic kidney disease)   . Depression   . HTN (hypertension)   . CAD (coronary artery disease)     a. CABG 1991 and 2006 LIMA LAD, seq. SVG D1-Cfx, SVG d2,and seq. SVG AM-PDA   . Dementia   . Chronic systolic CHF (congestive heart failure) ([place])   . Prostate cancer [facility])     Past Surgical History  Procedure Laterality Date  . Prostatectomy    . Coronary artery bypass graft       Current Outpatient Prescriptions  Medication Sig Dispense Refill  . amLODipine (NORVASC) 10 MG tablet Take 10 mg by mouth daily.  4  . atenolol (TENORMIN) 50 MG tablet Take 50 mg by mouth 2 (two) times daily.     Marland Kitchen buPROPion (WELLBUTRIN) 100 MG tablet Take 1 tablet by mouth daily.  2  . CALCIUM PO Take 1 tablet by mouth daily.    . Coenzyme Q10 (COQ10 PO) Take 1  capsule by mouth daily.    Mariane Baumgarten Sodium (DOCQLACE PO) Take 2 capsules by mouth 2 (two) times daily.    Marland Kitchen donepezil (ARICEPT) 5 MG tablet Take 5 mg by mouth at bedtime.    . feeding supplement, GLUCERNA SHAKE, (GLUCERNA SHAKE) LIQD Take 237 mLs by mouth 3 (three) times daily between meals. (Patient taking differently: Take 237 mLs by mouth 2 (two) times daily as needed. )  0  . ferrous sulfate 325 (65 FE) MG tablet Take 1 tablet (325 mg total) by mouth daily with breakfast. 30 tablet 1  . furosemide (LASIX) 20 MG tablet Take 1 tablet (20 mg total) by mouth daily. 30 tablet 2  . Glucose Blood (FREESTYLE LITE TEST VI) USE AS DIRECTED    . Insulin Detemir (LEVEMIR FLEXPEN) 100 UNIT/ML Pen Inject 10 Units into the skin at bedtime. 15 mL 11  . isosorbide-hydrALAZINE (BIDIL) 20-37.5 MG tablet Take 1 tablet by mouth 3 (three) times daily. 90 tablet 1  . linagliptin (TRADJENTA) 5 MG TABS tablet Take 7.5 mg by mouth daily.    . Multiple Vitamins-Minerals (ICAPS PO) Take 1 tablet by mouth 2 (two) times daily.    . ondansetron (ZOFRAN) 4 MG tablet Take 1 tablet (4 mg total) by mouth every 6 (six) hours as needed for nausea or vomiting. 20 tablet 0  . polyethylene glycol (  MIRALAX / GLYCOLAX) packet Take 17 g by mouth every other day.     . Probiotic Product (ALIGN PO) Take 1 capsule by mouth daily.    . ramelteon (ROZEREM) 8 MG tablet Take 1 tablet (8 mg total) by mouth at bedtime. 30 tablet 1  . SENNA PO Take 1 tablet by mouth daily.    . sertraline (ZOLOFT) 50 MG tablet Take 50 mg by mouth at bedtime.     Marland Kitchen warfarin (COUMADIN) 4 MG tablet Take 4-8 mg by mouth daily. 4mg  daily except 8mg  on  Saturday     No current facility-administered medications for this visit.    Allergies:   Iodinated diagnostic agents; Iohexol; Ace inhibitors; Simvastatin; and Aspirin    Social History:  The patient  reports that he has never smoked. He has never used smokeless tobacco. He reports that he does not drink  alcohol or use illicit drugs.   Family History:  The patient's family history includes Diabetes in his mother; Heart disease in his sister, sister, sister, and sister.    ROS:  Please see the history of present illness.   Otherwise, review of systems are positive for Anxiety, progression in decreased memory, palpitations, right greater than left lower extremity swelling, hearing loss, and shortness of breath with activity.   All other systems are reviewed and negative.    PHYSICAL EXAM: VS:  BP 160/84 mmHg  Pulse 88  Ht 5\' 10"  (1.778 m)  Wt 149 lb 1.9 oz (67.64 kg)  BMI 21.40 kg/m2 , BMI Body mass index is 21.4 kg/(m^2). GEN: Well nourished, well developed, in no acute distress HEENT: normal Neck: no JVD, carotid bruits, or masses Cardiac: RRR.  There is no murmur, rub, or gallop. There is trace ankle edema. Respiratory:  clear to auscultation bilaterally, normal work of breathing. GI: soft, nontender, nondistended, + BS MS: no deformity or atrophy Skin: warm and dry, no rash Neuro:  Strength and sensation are intact Psych: euthymic mood, full affect   EKG:  EKG is not ordered today.    Recent Labs: 11/12/2015: ALT 62; B Natriuretic Peptide 3358.8*; Magnesium 2.0 11/13/2015: Hemoglobin 9.7*; Platelets 148* 11/14/2015: BUN 50*; Creatinine, Ser 3.20*; Potassium 3.6; Sodium 144    Lipid Panel No results found for: CHOL, TRIG, HDL, CHOLHDL, VLDL, LDLCALC, LDLDIRECT    Wt Readings from Last 3 Encounters:  11/24/15 149 lb 1.9 oz (67.64 kg)  11/14/15 143 lb 3.2 oz (64.955 kg)  07/05/15 150 lb (68.04 kg)      Other studies Reviewed: Additional studies/ records that were reviewed today include: labs. The findings include creatinine is 3.14 BUN 64 potassium 4.5 on blood work from 11/21/2015.    ASSESSMENT AND PLAN:  1. Acute on chronic combined systolic and diastolic HF (heart failure) (HCC) Volume status is now back to baseline. He in fact may be slightly contracted.  2.  Coronary artery disease involving native coronary artery of native heart without angina pectoris Asymptomatic  3. Paroxysmal atrial fibrillation (HCC) No evidence of recurrent A. fib I recent telemetry evaluation download data  4. Chronic anticoagulation No bleeding complications  5. Essential hypertension Upper normal  6. CKD (chronic kidney disease), stage 3 (moderate) Now stage IV chronic kidney disease but stable  7. Dementia, without behavioral disturbance Progressive and also impacting his personality and psychological state.    Current medicines are reviewed at length with the patient today.  The patient has the following concerns regarding medicines: We discussed sliding-scale for Lasix  use..  The following changes/actions have been instituted:    On home scales 148 pounds is the metric for dry weight. Above or at this weight  we should use furosemide 40 mg per day until the weight is near 145 pounds.  Basic metabolic panel in 2 weeks as an outpatient  Clinical follow-up as needed  Labs/ tests ordered today include:  No orders of the defined types were placed in this encounter.    PROLONGED office visit requiring much explanation and discussion with family and occupying greater than 60% of the allotted time.  Disposition:   FU with HS in PRN   Signed, Sinclair Grooms, MD  11/24/2015 10:01 AM    [facility] [address], [place], [place]  [address] Phone: [phone]; Fax: [phone]

## 2015-11-28 LAB — PROTIME-INR: INR: 1.8 — AB (ref 0.9–1.1)

## 2015-12-01 ENCOUNTER — Ambulatory Visit (INDEPENDENT_AMBULATORY_CARE_PROVIDER_SITE_OTHER): Payer: Medicare Other | Admitting: Internal Medicine

## 2015-12-01 DIAGNOSIS — Z5181 Encounter for therapeutic drug level monitoring: Secondary | ICD-10-CM

## 2015-12-01 DIAGNOSIS — I480 Paroxysmal atrial fibrillation: Secondary | ICD-10-CM

## 2015-12-03 ENCOUNTER — Telehealth: Payer: Self-pay | Admitting: Interventional Cardiology

## 2015-12-03 NOTE — Telephone Encounter (Signed)
LM with Patrici Ranks for Jinny Blossom to call back, Jinny Blossom not at her desk.

## 2015-12-03 NOTE — Telephone Encounter (Signed)
I spoke with Megan-Megan asking about lab that pt needs to have done, she already has orders from [facility] for coumadin follow up.  Megan advised Dr Tamala Julian notes from 11/24/15 office visit indicate that Dr Tamala Julian ordered  a BMET as an outpatient in 2 weeks.

## 2015-12-03 NOTE — Telephone Encounter (Signed)
New message   Doctor making house call - wants to know how many frequency  patient needs to have lab work drawn

## 2015-12-08 ENCOUNTER — Encounter: Payer: Self-pay | Admitting: Interventional Cardiology

## 2015-12-08 LAB — PROTIME-INR: INR: 2 — AB (ref <=1.1)

## 2015-12-09 ENCOUNTER — Ambulatory Visit (INDEPENDENT_AMBULATORY_CARE_PROVIDER_SITE_OTHER): Payer: Medicare Other | Admitting: Internal Medicine

## 2015-12-09 DIAGNOSIS — I480 Paroxysmal atrial fibrillation: Secondary | ICD-10-CM

## 2015-12-09 DIAGNOSIS — Z5181 Encounter for therapeutic drug level monitoring: Secondary | ICD-10-CM

## 2015-12-14 ENCOUNTER — Other Ambulatory Visit: Payer: Self-pay | Admitting: Internal Medicine

## 2015-12-19 LAB — PROTIME-INR: INR: 1.8 — AB (ref <=1.1)

## 2015-12-23 ENCOUNTER — Ambulatory Visit (INDEPENDENT_AMBULATORY_CARE_PROVIDER_SITE_OTHER): Payer: Medicare Other | Admitting: Cardiology

## 2015-12-23 DIAGNOSIS — I480 Paroxysmal atrial fibrillation: Secondary | ICD-10-CM

## 2015-12-23 DIAGNOSIS — Z5181 Encounter for therapeutic drug level monitoring: Secondary | ICD-10-CM

## 2016-01-06 ENCOUNTER — Telehealth: Payer: Self-pay | Admitting: *Deleted

## 2016-01-06 NOTE — Telephone Encounter (Signed)
Telephoned [facility] concerning INR results from 01/02/16. [facility] customer service Rep placed me on hold to speak to  [employer], they did not draw lab and they states the earliest they can do is tomorrow.

## 2016-01-09 LAB — PROTIME-INR: INR: 2.2 — AB (ref 0.9–1.1)

## 2016-01-12 ENCOUNTER — Ambulatory Visit (INDEPENDENT_AMBULATORY_CARE_PROVIDER_SITE_OTHER): Payer: Medicare Other | Admitting: Cardiology

## 2016-01-12 ENCOUNTER — Encounter: Payer: Self-pay | Admitting: Interventional Cardiology

## 2016-01-12 DIAGNOSIS — I480 Paroxysmal atrial fibrillation: Secondary | ICD-10-CM

## 2016-01-12 DIAGNOSIS — Z5181 Encounter for therapeutic drug level monitoring: Secondary | ICD-10-CM

## 2016-01-13 ENCOUNTER — Other Ambulatory Visit: Payer: Self-pay | Admitting: Internal Medicine

## 2016-01-19 LAB — PROTIME-INR: INR: 2.1 — AB (ref 0.9–1.1)

## 2016-01-23 ENCOUNTER — Telehealth: Payer: Self-pay | Admitting: Pharmacist

## 2016-01-23 NOTE — Telephone Encounter (Signed)
Received call from [name] with [employer] that pt was instructed to hold his Coumadin for 3 days starting today d/t dark stool. Pt scheduled for INR check today. Confirmed that INR will still be checked today given possible sx of GI bleed.

## 2016-01-26 LAB — PROTIME-INR: INR: 2.2 — AB (ref 0.9–1.1)

## 2016-01-27 ENCOUNTER — Ambulatory Visit (INDEPENDENT_AMBULATORY_CARE_PROVIDER_SITE_OTHER): Payer: Medicare Other | Admitting: Pharmacist

## 2016-01-27 DIAGNOSIS — I480 Paroxysmal atrial fibrillation: Secondary | ICD-10-CM

## 2016-01-27 DIAGNOSIS — Z5181 Encounter for therapeutic drug level monitoring: Secondary | ICD-10-CM

## 2016-02-09 ENCOUNTER — Encounter (HOSPITAL_COMMUNITY): Payer: Self-pay | Admitting: *Deleted

## 2016-02-09 ENCOUNTER — Other Ambulatory Visit: Payer: Self-pay | Admitting: Geriatric Medicine

## 2016-02-09 ENCOUNTER — Ambulatory Visit
Admission: RE | Admit: 2016-02-09 | Discharge: 2016-02-09 | Disposition: A | Discharge status: Home / Self Care | Payer: Medicare Other | Source: Ambulatory Visit | Attending: Geriatric Medicine | Admitting: Geriatric Medicine

## 2016-02-09 ENCOUNTER — Observation Stay (HOSPITAL_COMMUNITY)
Admission: EM | Admit: 2016-02-09 | Discharge: 2016-02-10 | Disposition: A | Discharge status: Home Health | Payer: Medicare Other | Attending: Internal Medicine | Admitting: Internal Medicine

## 2016-02-09 DIAGNOSIS — Z794 Long term (current) use of insulin: Secondary | ICD-10-CM | POA: Insufficient documentation

## 2016-02-09 DIAGNOSIS — S066X9A Traumatic subarachnoid hemorrhage with loss of consciousness of unspecified duration, initial encounter: Principal | ICD-10-CM | POA: Insufficient documentation

## 2016-02-09 DIAGNOSIS — S06350S Traumatic hemorrhage of left cerebrum without loss of consciousness, sequela: Secondary | ICD-10-CM | POA: Diagnosis present

## 2016-02-09 DIAGNOSIS — W19XXXA Unspecified fall, initial encounter: Secondary | ICD-10-CM

## 2016-02-09 DIAGNOSIS — I480 Paroxysmal atrial fibrillation: Secondary | ICD-10-CM | POA: Diagnosis not present

## 2016-02-09 DIAGNOSIS — F0390 Unspecified dementia without behavioral disturbance: Secondary | ICD-10-CM | POA: Diagnosis not present

## 2016-02-09 DIAGNOSIS — S0003XA Contusion of scalp, initial encounter: Secondary | ICD-10-CM | POA: Diagnosis not present

## 2016-02-09 DIAGNOSIS — I5022 Chronic systolic (congestive) heart failure: Secondary | ICD-10-CM | POA: Insufficient documentation

## 2016-02-09 DIAGNOSIS — Z7901 Long term (current) use of anticoagulants: Secondary | ICD-10-CM | POA: Diagnosis not present

## 2016-02-09 DIAGNOSIS — Z8546 Personal history of malignant neoplasm of prostate: Secondary | ICD-10-CM | POA: Diagnosis not present

## 2016-02-09 DIAGNOSIS — W010XXA Fall on same level from slipping, tripping and stumbling without subsequent striking against object, initial encounter: Secondary | ICD-10-CM | POA: Insufficient documentation

## 2016-02-09 DIAGNOSIS — N184 Chronic kidney disease, stage 4 (severe): Secondary | ICD-10-CM | POA: Diagnosis not present

## 2016-02-09 DIAGNOSIS — W228XXA Striking against or struck by other objects, initial encounter: Secondary | ICD-10-CM

## 2016-02-09 DIAGNOSIS — I2510 Atherosclerotic heart disease of native coronary artery without angina pectoris: Secondary | ICD-10-CM | POA: Diagnosis not present

## 2016-02-09 DIAGNOSIS — I619 Nontraumatic intracerebral hemorrhage, unspecified: Secondary | ICD-10-CM | POA: Diagnosis present

## 2016-02-09 DIAGNOSIS — Z951 Presence of aortocoronary bypass graft: Secondary | ICD-10-CM | POA: Diagnosis not present

## 2016-02-09 DIAGNOSIS — I130 Hypertensive heart and chronic kidney disease with heart failure and stage 1 through stage 4 chronic kidney disease, or unspecified chronic kidney disease: Secondary | ICD-10-CM | POA: Diagnosis not present

## 2016-02-09 DIAGNOSIS — Z86718 Personal history of other venous thrombosis and embolism: Secondary | ICD-10-CM | POA: Insufficient documentation

## 2016-02-09 DIAGNOSIS — S06350A Traumatic hemorrhage of left cerebrum without loss of consciousness, initial encounter: Secondary | ICD-10-CM

## 2016-02-09 DIAGNOSIS — I10 Essential (primary) hypertension: Secondary | ICD-10-CM

## 2016-02-09 DIAGNOSIS — E1122 Type 2 diabetes mellitus with diabetic chronic kidney disease: Secondary | ICD-10-CM | POA: Diagnosis not present

## 2016-02-09 LAB — I-STAT CHEM 8, ED
BUN: 36 mg/dL — AB (ref 6–20)
CALCIUM ION: 1.28 mmol/L — AB (ref 1.12–1.23)
Chloride: 104 mmol/L (ref 101–111)
Creatinine, Ser: 2.7 mg/dL — ABNORMAL HIGH (ref 0.61–1.24)
Glucose, Bld: 168 mg/dL — ABNORMAL HIGH (ref 65–99)
HEMATOCRIT: 36 % — AB (ref 39.0–52.0)
HEMOGLOBIN: 12.2 g/dL — AB (ref 13.0–17.0)
Potassium: 4 mmol/L (ref 3.5–5.1)
SODIUM: 139 mmol/L (ref 135–145)
TCO2: 25 mmol/L (ref 0–100)

## 2016-02-09 LAB — BASIC METABOLIC PANEL
ANION GAP: 8 (ref 5–15)
BUN: 36 mg/dL — ABNORMAL HIGH (ref 6–20)
CALCIUM: 9.6 mg/dL (ref 8.9–10.3)
CO2: 24 mmol/L (ref 22–32)
CREATININE: 2.78 mg/dL — AB (ref 0.61–1.24)
Chloride: 105 mmol/L (ref 101–111)
GFR, EST AFRICAN AMERICAN: 22 mL/min — AB (ref >60)
GFR, EST NON AFRICAN AMERICAN: 19 mL/min — AB (ref >60)
Glucose, Bld: 168 mg/dL — ABNORMAL HIGH (ref 65–99)
Potassium: 3.9 mmol/L (ref 3.5–5.1)
SODIUM: 137 mmol/L (ref 135–145)

## 2016-02-09 LAB — CBC
HCT: 34.9 % — ABNORMAL LOW (ref 39.0–52.0)
HEMOGLOBIN: 11.7 g/dL — AB (ref 13.0–17.0)
MCH: 30 pg (ref 26.0–34.0)
MCHC: 33.5 g/dL (ref 30.0–36.0)
MCV: 89.5 fL (ref 78.0–100.0)
PLATELETS: 141 10*3/uL — AB (ref 150–400)
RBC: 3.9 MIL/uL — AB (ref 4.22–5.81)
RDW: 13.8 % (ref 11.5–15.5)
WBC: 4.9 10*3/uL (ref 4.0–10.5)

## 2016-02-09 LAB — URINALYSIS, ROUTINE W REFLEX MICROSCOPIC
BILIRUBIN URINE: NEGATIVE
Glucose, UA: NEGATIVE mg/dL
HGB URINE DIPSTICK: NEGATIVE
KETONES UR: NEGATIVE mg/dL
Leukocytes, UA: NEGATIVE
NITRITE: NEGATIVE
PROTEIN: NEGATIVE mg/dL
SPECIFIC GRAVITY, URINE: 1.014 (ref 1.005–1.030)
pH: 6.5 (ref 5.0–8.0)

## 2016-02-09 LAB — APTT: aPTT: 32 seconds (ref 24–37)

## 2016-02-09 LAB — PROTIME-INR
INR: 2.42 — AB (ref 0.00–1.49)
INR: 1.36 (ref 0.00–1.49)
PROTHROMBIN TIME: 26.1 s — AB (ref 11.6–15.2)
PROTHROMBIN TIME: 16.9 s — AB (ref 11.6–15.2)

## 2016-02-09 LAB — ABO/RH: ABO/RH(D): AB POS

## 2016-02-09 MED ORDER — RAMELTEON 8 MG PO TABS
8.0000 mg | ORAL_TABLET | Freq: Every day | ORAL | Status: DC
  Administered 2016-02-09: 8 mg via ORAL
  Filled 2016-02-09: qty 1

## 2016-02-09 MED ORDER — BUPROPION HCL 100 MG PO TABS
100.0000 mg | ORAL_TABLET | Freq: Every day | ORAL | Status: DC
  Administered 2016-02-10: 100 mg via ORAL
  Filled 2016-02-09: qty 1

## 2016-02-09 MED ORDER — SODIUM CHLORIDE 0.9% FLUSH
3.0000 mL | Freq: Two times a day (BID) | INTRAVENOUS | Status: DC
  Administered 2016-02-09 – 2016-02-10 (×2): 3 mL via INTRAVENOUS

## 2016-02-09 MED ORDER — ACETAMINOPHEN 650 MG RE SUPP: 650.0000 mg | Freq: Four times a day (QID) | RECTAL | Status: DC | PRN

## 2016-02-09 MED ORDER — ONDANSETRON HCL 4 MG PO TABS: 4.0000 mg | ORAL_TABLET | Freq: Four times a day (QID) | ORAL | Status: DC | PRN

## 2016-02-09 MED ORDER — ACETAMINOPHEN 325 MG PO TABS: 650.0000 mg | ORAL_TABLET | Freq: Four times a day (QID) | ORAL | Status: DC | PRN

## 2016-02-09 MED ORDER — ONDANSETRON HCL 4 MG/2ML IJ SOLN: 4.0000 mg | Freq: Four times a day (QID) | INTRAMUSCULAR | Status: DC | PRN

## 2016-02-09 MED ORDER — ATENOLOL 50 MG PO TABS
50.0000 mg | ORAL_TABLET | Freq: Two times a day (BID) | ORAL | Status: DC
  Administered 2016-02-09 – 2016-02-10 (×2): 50 mg via ORAL
  Filled 2016-02-09 (×2): qty 1

## 2016-02-09 MED ORDER — INSULIN ASPART 100 UNIT/ML ~~LOC~~ SOLN
0.0000 [IU] | Freq: Every day | SUBCUTANEOUS | Status: DC
  Administered 2016-02-09: 2 [IU] via SUBCUTANEOUS

## 2016-02-09 MED ORDER — AMLODIPINE BESYLATE 10 MG PO TABS
10.0000 mg | ORAL_TABLET | Freq: Every day | ORAL | Status: DC
  Administered 2016-02-10: 10 mg via ORAL
  Filled 2016-02-09: qty 1

## 2016-02-09 MED ORDER — HYDROCODONE-ACETAMINOPHEN 5-325 MG PO TABS
1.0000 | ORAL_TABLET | ORAL | Status: DC | PRN
Start: 2016-02-09 — End: 2016-02-10

## 2016-02-09 MED ORDER — INSULIN ASPART 100 UNIT/ML ~~LOC~~ SOLN: 0.0000 [IU] | Freq: Three times a day (TID) | SUBCUTANEOUS | Status: DC

## 2016-02-09 MED ORDER — DONEPEZIL HCL 5 MG PO TABS
5.0000 mg | ORAL_TABLET | Freq: Every day | ORAL | Status: DC
Start: 2016-02-09 — End: 2016-02-10
  Administered 2016-02-09: 5 mg via ORAL
  Filled 2016-02-09: qty 1

## 2016-02-09 MED ORDER — PROTHROMBIN COMPLEX CONC HUMAN 500 UNITS IV KIT
1500.0000 [IU] | PACK | Status: DC
  Filled 2016-02-09: qty 60

## 2016-02-09 MED ORDER — INSULIN DETEMIR 100 UNIT/ML ~~LOC~~ SOLN
10.0000 [IU] | Freq: Every day | SUBCUTANEOUS | Status: DC
  Administered 2016-02-09: 10 [IU] via SUBCUTANEOUS
  Filled 2016-02-09 (×2): qty 0.1

## 2016-02-09 MED ORDER — VITAMIN K1 10 MG/ML IJ SOLN
10.0000 mg | INTRAVENOUS | Status: AC
  Administered 2016-02-09: 10 mg via INTRAVENOUS
  Filled 2016-02-09: qty 1

## 2016-02-09 MED ORDER — PROTHROMBIN COMPLEX CONC HUMAN 500 UNITS IV KIT
1622.0000 [IU] | PACK | Status: AC
  Administered 2016-02-09: 1622 [IU] via INTRAVENOUS
  Filled 2016-02-09: qty 65

## 2016-02-09 MED ORDER — DOCUSATE SODIUM 100 MG PO CAPS
100.0000 mg | ORAL_CAPSULE | Freq: Two times a day (BID) | ORAL | Status: DC
  Administered 2016-02-09 – 2016-02-10 (×2): 100 mg via ORAL
  Filled 2016-02-09 (×2): qty 1

## 2016-02-09 NOTE — H&P (Addendum)
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  DM 2, Left Leg DVT 2 years ago    Presented with yesterday patient had a fall he was evaluated by his primary care provider who obtain CT scan showed 1.5 cm intraparenchymal bleed. Fall wasn't witnessed but patient was found by his wife alert and awake. She reports the fall was mechanical he tripped on something. Denies LOC Patient has baseline dementia appears to have mentation currently at baseline  2 weeks ago patient did had some black stools and at that point his Coumadin was held. Denies any chest pain or shortness of breath  Regarding pertinent Chronic problems: The patient's history of coronary artery disease he had a CABG in 1991 and Again in 2006 followed by cardiology on chronic anticoagulation for history of paroxysmal atrial fibrillation Last Echo was in April 2017 EF 40-45%, PA peak pressure 49  IN ER: afebrile , HR 137 now down to 67 BP 139/77 WBC 4.9, HG 11.7, Plt 141, BUN 36, Cr 2.78 INR 2.42 CT head focal intraparenchymal hemorrhage in the left temporal lobe 1.5 without mass effect He was given vitamin K 10 mg IV and prothrombin complex KCEnra 1, 622 units at 18:51 Case has been discussed by ER provided with neurosurgery. Dr. Sherwood Gambler who recommended repeat CT in the morning and call him with results admit to medicine.    Hospitalist was called for admission for ICH in the setting of coumadin   Review of Systems:    Pertinent positives include:  Fall  Constitutional:  No weight loss, night sweats, Fevers, chills, fatigue, weight loss  HEENT:  No headaches, Difficulty swallowing,Tooth/dental problems,Sore throat,  No sneezing, itching, ear ache, nasal congestion, post nasal drip,  Cardio-vascular:  No chest pain, Orthopnea, PND, anasarca, dizziness, palpitations.no Bilateral lower extremity swelling  GI:  No heartburn, indigestion, abdominal pain, nausea, vomiting, diarrhea, change in bowel habits, loss of appetite, melena, blood in stool, hematemesis Resp:  no shortness of breath at rest. No dyspnea on exertion, No excess mucus, no productive cough, No non-productive cough, No coughing up of blood.No change in color of mucus.No wheezing. Skin:  no rash or lesions. No jaundice GU:  no dysuria, change in color of urine, no urgency or frequency. No straining to urinate.  No flank pain.  Musculoskeletal:  No joint pain or no joint swelling. No decreased range of motion. No back pain.  Psych:  No change in mood or affect. No depression or anxiety. No memory loss.  Neuro: no localizing neurological complaints, no tingling, no weakness, no double vision, no gait abnormality, no slurred speech, no confusion  As per HPI otherwise 10 point review of systems negative.   Past Medical History: Past Medical History  Diagnosis Date  . Diabetes mellitus   . Tachycardia-bradycardia syndrome ([place])   . Left leg DVT ([place])   . CKD (chronic kidney disease)   . Depression   . HTN (hypertension)   . CAD (coronary artery disease)     a. CABG 1991 and 2006 [place] L[place], seq.  SVG D1-Cfx, SVG d2,and seq. SVG AM-PDA   . Dementia   . Chronic systolic CHF (congestive heart failure) ([place])   . Prostate cancer [facility])    Past Surgical History  Procedure Laterality Date  . Prostatectomy    . Coronary artery bypass graft       Social History:  Ambulatory  walker      reports that he has never smoked. He has never used smokeless tobacco. He  reports that he does not drink alcohol or use illicit drugs.  Allergies:   Allergies  Allergen Reactions  . Iodinated Diagnostic Agents Anaphylaxis  . Iohexol Anaphylaxis     Desc: RN states pt stated he had anyphalactic shock reaction to contrast approx 10 years ago.   . Ace Inhibitors Other (See Comments)    UNKNOWN  . Simvastatin Other (See Comments)    Muscle aches  . Aspirin     Other reaction(s): Abdominal Pain       Family History:    Family History  Problem Relation Age of Onset  . Diabetes Mother   . Heart disease Sister   . Heart disease Sister   . Heart disease Sister   . Heart disease Sister     Medications: Prior to Admission medications   Medication Sig Start Date End Date Taking? Authorizing Provider  amLODipine (NORVASC) 10 MG tablet Take 10 mg by mouth daily. 12/31/14   Historical Provider, MD  atenolol (TENORMIN) 50 MG tablet Take 50 mg by mouth 2 (two) times daily.     Historical Provider, MD  buPROPion (WELLBUTRIN) 100 MG tablet Take 1 tablet by mouth daily. 12/09/14   Historical Provider, MD  CALCIUM PO Take 1 tablet by mouth daily.    Historical Provider, MD  Coenzyme Q10 (COQ10 PO) Take 1 capsule by mouth daily.    Historical Provider, MD  Docusate Sodium (DOCQLACE PO) Take 2 capsules by mouth 2 (two) times daily.    Historical Provider, MD  donepezil (ARICEPT) 5 MG tablet Take 5 mg by mouth at bedtime.    Historical Provider, MD  feeding supplement, GLUCERNA SHAKE, (GLUCERNA SHAKE) LIQD Take 237 mLs by mouth 3 (three) times daily between meals. Patient taking differently: Take 237 mLs by mouth 2 (two) times daily as needed.  07/07/15   Iline Oven, MD  ferrous sulfate 325 (65 FE) MG tablet Take 1 tablet (325 mg total) by mouth daily with breakfast. 11/15/15   Juliet Rude, MD  furosemide (LASIX) 20 MG tablet Take 1 tablet (20 mg total) by mouth daily. 11/15/15   Juliet Rude, MD  Glucose Blood (FREESTYLE LITE TEST VI) USE AS DIRECTED 06/02/13    Historical Provider, MD  Insulin Detemir (LEVEMIR FLEXPEN) 100 UNIT/ML Pen Inject 10 Units into the skin at bedtime. 11/15/15   Carly J Rivet, MD  isosorbide-hydrALAZINE (BIDIL) 20-37.5 MG tablet Take 1 tablet by mouth 3 (three) times daily. 11/15/15   Carly Montey Hora, MD  linagliptin (TRADJENTA) 5 MG TABS tablet Take 7.5 mg by mouth daily.    Historical Provider, MD  Multiple Vitamins-Minerals (ICAPS PO) Take 1 tablet by mouth 2 (two) times daily.    Historical Provider, MD  ondansetron (ZOFRAN) 4 MG tablet Take 1 tablet (4 mg total) by mouth every 6 (six) hours as needed for nausea or vomiting. 07/06/15   Iline Oven, MD  polyethylene glycol [facility] / Floria Raveling) packet Take 17 g by mouth every other day.     Historical Provider,  MD  Probiotic Product (ALIGN PO) Take 1 capsule by mouth daily.    Historical Provider, MD  ramelteon (ROZEREM) 8 MG tablet Take 1 tablet (8 mg total) by mouth at bedtime. 11/15/15   Juliet Rude, MD  SENNA PO Take 1 tablet by mouth daily.    Historical Provider, MD  sertraline (ZOLOFT) 50 MG tablet Take 50 mg by mouth at bedtime.     Historical Provider, MD  warfarin (COUMADIN) 4 MG tablet Take 4-8 mg by mouth daily. 4mg  daily except 8mg  on  Saturday    Historical Provider, MD    Physical Exam: Patient Vitals for the past 24 hrs:  BP Temp Temp src Pulse Resp SpO2 Weight  02/09/16 1800 - - - - - - 63.05 kg (139 lb)  02/09/16 1745 139/77 mmHg - - 67 - 99 % -  02/09/16 1730 143/69 mmHg - - 60 - 100 % -  02/09/16 1715 142/68 mmHg - - 63 - 100 % -  02/09/16 1700 150/79 mmHg - - 80 - 100 % -  02/09/16 1658 154/96 mmHg - - (!) 137 - 100 % -  02/09/16 1645 147/76 mmHg 97.6 F (36.4 C) Oral 100 18 98 % -    1. General:  in No Acute distress 2. Psychological: Alert not Oriented to situation 3. Head/ENT:    Dry Mucous Membranes                          Head  traumaticSmall bruise on the left temple, neck supple                          Normal  Dentition 4. SKIN:   decreased Skin turgor,  Skin clean Dry and intact no rash 5. Heart: Regular rate and rhythm no Murmur, Rub or gallop 6. Lungs: Clear to auscultation bilaterally, no wheezes or crackles   7. Abdomen: Soft, non-tender, Non distended 8. Lower extremities: no clubbing, cyanosis, or edema 9. Neurologically he 5 out of 5 in all 4 extremities cranial nerves II through XII intact 10. MSK: Normal range of motion   body mass index is 19.94 kg/(m^2).  Labs on Admission:   Labs on Admission: I have personally reviewed following labs and imaging studies  CBC:  Recent Labs Lab 02/09/16 1723 02/09/16 1726  WBC  --  4.9  HGB 12.2* 11.7*  HCT 36.0* 34.9*  MCV  --  89.5  PLT  --  [id number]*   Basic Metabolic Panel:  Recent Labs Lab 02/09/16 1723 02/09/16 1726  NA 139 137  K 4.0 3.9  CL 104 105  CO2  --  24  GLUCOSE 168* 168*  BUN 36* 36*  CREATININE 2.70* 2.78*  CALCIUM  --  9.6   GFR: Estimated Creatinine Clearance: 15.8 mL/min (by C-G formula based on Cr of 2.78). Liver Function Tests: No results for input(s): AST, ALT, ALKPHOS, BILITOT, PROT, ALBUMIN in the last 168 hours. No results for input(s): LIPASE, AMYLASE in the last 168 hours. No results for input(s): AMMONIA in the last 168 hours. Coagulation Profile:  Recent Labs Lab 02/09/16 1726  INR 2.42*   Cardiac Enzymes: No results for input(s): CKTOTAL, CKMB, CKMBINDEX, TROPONINI in the last 168 hours. BNP (last 3 results) No results for input(s): PROBNP in the last 8760 hours. HbA1C: No results for input(s): HGBA1C in the last 72 hours. CBG: No results for input(s): GLUCAP in  the last 168 hours. Lipid Profile: No results for input(s): CHOL, HDL, LDLCALC, TRIG, CHOLHDL, LDLDIRECT in the last 72 hours. Thyroid Function Tests: No results for input(s): TSH, T4TOTAL, FREET4, T3FREE, THYROIDAB in the last 72 hours. Anemia Panel: No results for input(s): VITAMINB12, FOLATE, FERRITIN, TIBC, IRON, RETICCTPCT in the last 72  hours. Urine analysis:    Component Value Date/Time   COLORURINE YELLOW 02/09/2016 [place] 02/09/2016 1745   LABSPEC 1.014 02/09/2016 1745   PHURINE 6.5 02/09/2016 1745   GLUCOSEU NEGATIVE 02/09/2016 1745   HGBUR NEGATIVE 02/09/2016 1745   BILIRUBINUR NEGATIVE 02/09/2016 1745   KETONESUR NEGATIVE 02/09/2016 1745   PROTEINUR NEGATIVE 02/09/2016 1745   UROBILINOGEN 1.0 09/23/2009 0124   NITRITE NEGATIVE 02/09/2016 1745   LEUKOCYTESUR NEGATIVE 02/09/2016 1745   Sepsis Labs: @LABRCNTIP (procalcitonin:4,lacticidven:4) )No results found for this or any previous visit (from the past 240 hour(s)).     UA   no evidence of UTI  Lab Results  Component Value Date   HGBA1C 6.8* 11/13/2015    Estimated Creatinine Clearance: 15.8 mL/min (by C-G formula based on Cr of 2.78).  BNP (last 3 results) No results for input(s): PROBNP in the last 8760 hours.   ECG REPORT  Independently reviewed Rate: 77  Rhythm: Paced ST&T Change: No acute ischemic changes  QTC 531  Filed Weights   02/09/16 1800  Weight: 63.05 kg (139 lb)     Cultures:    Component Value Date/Time   SDES URINE, CLEAN CATCH 11/12/2015 1106   SPECREQUEST NONE 11/12/2015 1106   CULT 2,000 COLONIES/mL INSIGNIFICANT GROWTH* 11/12/2015 1106   REPTSTATUS 11/14/2015 FINAL 11/12/2015 1106     Radiological Exams on Admission: Ct Head Wo Contrast  02/09/2016  CLINICAL DATA:  [place] last p.m. EXAM: CT HEAD WITHOUT CONTRAST TECHNIQUE: Contiguous axial images were obtained from the base of the skull through the vertex without intravenous contrast. COMPARISON:  11/12/2015 FINDINGS: No skull fracture is noted Paranasal sinuses shows mild mucosal thickening bilateral sphenoid sinus. The mastoid air cells are unremarkable. Atherosclerotic calcifications of carotid siphon are noted. Moderate cerebral atrophy. Again noted periventricular and patchy subcortical white matter decreased attenuation consistent with chronic  small vessel ischemic changes. Axial image 15 there is focal intraparenchymal hemorrhage in left temporal lobe measures about 1.5 cm. No mass effect or midline shift. No intraventricular hemorrhage. IMPRESSION: 1. There is focal intraparenchymal hemorrhage in left temporal lobe measures 1.5 cm without mass effect or surrounding edema. Stable atrophy and chronic white matter disease. No intraventricular hemorrhage. Electronically Signed   By: Lahoma Crocker M.D.   On: 02/09/2016 15:39    Chart has been reviewed    Assessment/Plan  82 y.o. male with medical history significant of HTN, CAD,CKD stage IV, tachybradycardia syndrome, paroxysmal atrial fibrillation on chronic anticoagulation therapy, and permanent pacemaker therapy, significant dementia.  DM 2, Left Leg DVT 2 years ago Here with intracranial bleeding after mechanical fall in the setting of Coumadin has been reversed on emergency department being admitted for observation  Present on Admission:  . ICH (intracerebral hemorrhage) ([place]) - admit to step down, frequent neuro checks, repeat CT in the morning, we'll need to contact neurosurgery results.  .chronic renal failure ([employer]) resting currently at baseline I would renal toxic medications   . CAD (coronary artery disease) stable chest pain-free. Continue metoprolol  . Chronic systolic CHF (congestive heart failure) ([facility]) is to be slightly on the dry side. Hold Lasix for tonight to resume as needed  .  Dementia chronic may expect some degree of sundowning  . HTN (hypertension) chronic hold hydrochlorothiazide given patient appears to be somewhat fluid down.  . Atrial fibrillation ([place]) - will hold Coumadin given ongoing intracranial bleeding. Case was discussed with cardiology by EER provider who feels the patient should be off of anticoagulation from now on given risk of falls   diabetes mellitus we'll order sliding scale and continue Levemir  Other plan as per orders.  DVT prophylaxis:  SCD    Code Status:  FULL CODE as per  family but family is unsure  Family Communication:   Family not at  Bedside  plan of care was discussed with   Wife [name]  [phone].   Disposition Plan:     To home once workup is complete and patient is stable   Consults called: ER MD discussed with Neurosurgery    Admission status:   obs    Level of care    SDU      I have spent a total of 56 min on this admission    Jalen Daluz 02/09/2016, 7:28 PM    [facility]  Pager [phone]   after 2 AM please page floor coverage PA If 7AM-7PM, please contact the day team taking care of the patient  [email or web address]  Password [email or web address]

## 2016-02-09 NOTE — Consult Note (Signed)
Called by Dr. Davonna Belling (EDP) because patient presented after having fallen at home 19 hours ago. Notably he is on Coumadin for PAF. Dr. Alvino Chapel explains the patient is at his neurologic baseline, but he did do a CT, which revealed a small parenchymal hemorrhage in the left insula, as well as significant generalized atrophy. Dr. Alvino Chapel initiated reversal of his anticoagulation with Wandalee Ferdinand and the patient is being admitted to the medicine service. Dr. Alvino Chapel has also spoken with the patient's cardiologist Dr. Daneen Schick, who indicated that he does not plan on restarting the patient's anticoagulation.  Dr. Alvino Chapel did not request a formal neurosurgical consultation because of the small size of the patient's hemorrhage and that he is at his neurologic baseline. I've recommended that a repeat CT of the brain without contrast be done tomorrow and that if it is stable, no further follow-up of the hemorrhage is needed, but I would be happy to review the CT with the hospitalist.

## 2016-02-09 NOTE — ED Notes (Signed)
PT fell last nite and he has a hematoma to left lateral forehead area.  Fall was unwitnessed, but wife found him and he was alert and awake.  PT sent here after CT revealed intraparenchymal hemorrhage in the left temporal lob and pt is on coumadin.  Aide reports pain with urination and decreased amount.  Pt denies any pain

## 2016-02-09 NOTE — ED Provider Notes (Signed)
CSN: [id number]     Arrival date & time 02/09/16  1629 History   First MD Initiated Contact with Patient 02/09/16 1650     Chief Complaint  Patient presents with  . Fall      Patient is a 82 y.o. male presenting with fall. The history is provided by the patient.  Fall Pertinent negatives include no chest pain, no abdominal pain and no headaches.  Patient was brought in after a fall last night. Has some baseline dementia. Reportedly hit the left side of his head and had a hematoma. Saw his primary care doctor and outpatient CT scan showed intraparenchymal hemorrhage. He is on Coumadin. Mental status he is on his baseline. Maybe a little sleepy but otherwise he is at his baseline. Fall was around 11:00 last night and it is around 5:30 PM right now. No numbness or weakness. He is on Coumadin for paroxysmal atrial fibrillation. He is a retired [employer] features editor.  Past Medical History  Diagnosis Date  . Diabetes mellitus   . Tachycardia-bradycardia syndrome ([place])   . Left leg DVT ([place])   . CKD (chronic kidney disease)   . Depression   . HTN (hypertension)   . CAD (coronary artery disease)     a. CABG 1991 and 2006 LIMA LAD, seq. SVG D1-Cfx, SVG d2,and seq. SVG AM-PDA   . Dementia   . Chronic systolic CHF (congestive heart failure) ([place])   . Prostate cancer [facility])    Past Surgical History  Procedure Laterality Date  . Prostatectomy    . Coronary artery bypass graft     Family History  Problem Relation Age of Onset  . Diabetes Mother   . Heart disease Sister   . Heart disease Sister   . Heart disease Sister   . Heart disease Sister    Social History  Substance Use Topics  . Smoking status: Never Smoker   . Smokeless tobacco: Never Used  . Alcohol Use: No    Review of Systems  Constitutional: Negative for appetite change.  Respiratory: Negative for chest tightness.   Cardiovascular: Negative for chest pain.  Gastrointestinal: Negative for abdominal pain.  Genitourinary:  Negative for flank pain.  Musculoskeletal: Negative for back pain.  Skin: Negative for wound.  Neurological: Negative for weakness and headaches.  Hematological: Negative for adenopathy.      Allergies  Iodinated diagnostic agents; Iohexol; Ace inhibitors; Simvastatin; and Aspirin  Home Medications   Prior to Admission medications   Medication Sig Start Date End Date Taking? Authorizing Provider  amLODipine (NORVASC) 10 MG tablet Take 10 mg by mouth daily. 12/31/14   Historical Provider, MD  atenolol (TENORMIN) 50 MG tablet Take 50 mg by mouth 2 (two) times daily.     Historical Provider, MD  buPROPion (WELLBUTRIN) 100 MG tablet Take 1 tablet by mouth daily. 12/09/14   Historical Provider, MD  CALCIUM PO Take 1 tablet by mouth daily.    Historical Provider, MD  Coenzyme Q10 (COQ10 PO) Take 1 capsule by mouth daily.    Historical Provider, MD  Docusate Sodium (DOCQLACE PO) Take 2 capsules by mouth 2 (two) times daily.    Historical Provider, MD  donepezil (ARICEPT) 5 MG tablet Take 5 mg by mouth at bedtime.    Historical Provider, MD  feeding supplement, GLUCERNA SHAKE, (GLUCERNA SHAKE) LIQD Take 237 mLs by mouth 3 (three) times daily between meals. Patient taking differently: Take 237 mLs by mouth 2 (two) times daily as needed.  07/07/15  Iline Oven, MD  ferrous sulfate 325 (65 FE) MG tablet Take 1 tablet (325 mg total) by mouth daily with breakfast. 11/15/15   Juliet Rude, MD  furosemide (LASIX) 20 MG tablet Take 1 tablet (20 mg total) by mouth daily. 11/15/15   Juliet Rude, MD  Glucose Blood (FREESTYLE LITE TEST VI) USE AS DIRECTED 06/02/13   Historical Provider, MD  Insulin Detemir (LEVEMIR FLEXPEN) 100 UNIT/ML Pen Inject 10 Units into the skin at bedtime. 11/15/15   Carly J Rivet, MD  isosorbide-hydrALAZINE (BIDIL) 20-37.5 MG tablet Take 1 tablet by mouth 3 (three) times daily. 11/15/15   Carly Montey Hora, MD  linagliptin (TRADJENTA) 5 MG TABS tablet Take 7.5 mg by mouth daily.     Historical Provider, MD  Multiple Vitamins-Minerals (ICAPS PO) Take 1 tablet by mouth 2 (two) times daily.    Historical Provider, MD  ondansetron (ZOFRAN) 4 MG tablet Take 1 tablet (4 mg total) by mouth every 6 (six) hours as needed for nausea or vomiting. 07/06/15   Iline Oven, MD  polyethylene glycol [facility] Floria Raveling) packet Take 17 g by mouth every other day.     Historical Provider, MD  Probiotic Product (ALIGN PO) Take 1 capsule by mouth daily.    Historical Provider, MD  ramelteon (ROZEREM) 8 MG tablet Take 1 tablet (8 mg total) by mouth at bedtime. 11/15/15   Juliet Rude, MD  SENNA PO Take 1 tablet by mouth daily.    Historical Provider, MD  sertraline (ZOLOFT) 50 MG tablet Take 50 mg by mouth at bedtime.     Historical Provider, MD  warfarin (COUMADIN) 4 MG tablet Take 4-8 mg by mouth daily. 4mg  daily except 8mg  on  Saturday    Historical Provider, MD   BP 139/77 mmHg  Pulse 67  Temp(Src) 97.6 F (36.4 C) (Oral)  Resp 18  Wt 139 lb (63.05 kg)  SpO2 99% Physical Exam  Constitutional: He appears well-developed.  HENT:  Hematoma to left temporal area. Bilateral hearing aids.  Eyes: EOM are normal.  Cardiovascular: Normal rate.   Pulmonary/Chest: Effort normal.  Abdominal: Soft.  Musculoskeletal: He exhibits no edema.  Neurological: He is alert.  Awake and appropriate and at his baseline per his wife.  Skin: Skin is warm.    ED Course  Procedures (including critical care time) Labs Review Labs Reviewed  PROTIME-INR - Abnormal; Notable for the following:    Prothrombin Time 26.1 (*)    INR 2.42 (*)    All other components within normal limits  BASIC METABOLIC PANEL - Abnormal; Notable for the following:    Glucose, Bld 168 (*)    BUN 36 (*)    Creatinine, Ser 2.78 (*)    GFR calc non Af Amer 19 (*)    GFR calc Af Amer 22 (*)    All other components within normal limits  CBC - Abnormal; Notable for the following:    RBC 3.90 (*)    Hemoglobin 11.7 (*)     HCT 34.9 (*)    Platelets 141 (*)    All other components within normal limits  I-STAT CHEM 8, ED - Abnormal; Notable for the following:    BUN 36 (*)    Creatinine, Ser 2.70 (*)    Glucose, Bld 168 (*)    Calcium, Ion 1.28 (*)    Hemoglobin 12.2 (*)    HCT 36.0 (*)    All other components within normal limits  APTT  URINALYSIS, ROUTINE W REFLEX MICROSCOPIC (NOT AT [facility])  PROTIME-INR  PROTIME-INR  TYPE AND SCREEN  ABO/RH    Imaging Review Ct Head Wo Contrast  02/09/2016  CLINICAL DATA:  [place] last p.m. EXAM: CT HEAD WITHOUT CONTRAST TECHNIQUE: Contiguous axial images were obtained from the base of the skull through the vertex without intravenous contrast. COMPARISON:  11/12/2015 FINDINGS: No skull fracture is noted Paranasal sinuses shows mild mucosal thickening bilateral sphenoid sinus. The mastoid air cells are unremarkable. Atherosclerotic calcifications of carotid siphon are noted. Moderate cerebral atrophy. Again noted periventricular and patchy subcortical white matter decreased attenuation consistent with chronic small vessel ischemic changes. Axial image 15 there is focal intraparenchymal hemorrhage in left temporal lobe measures about 1.5 cm. No mass effect or midline shift. No intraventricular hemorrhage. IMPRESSION: 1. There is focal intraparenchymal hemorrhage in left temporal lobe measures 1.5 cm without mass effect or surrounding edema. Stable atrophy and chronic white matter disease. No intraventricular hemorrhage. Electronically Signed   By: Lahoma Crocker M.D.   On: 02/09/2016 15:39   I have personally reviewed and evaluated these images and lab results as part of my medical decision-making.   EKG Interpretation   Date/Time:  Monday February 09 2016 16:48:17 EDT Ventricular Rate:  77 PR Interval:    QRS Duration: 188 QT Interval:  470 QTC Calculation: 531 R Axis:   -79 Text Interpretation:  AV sequential or dual chamber electronic pacemaker  Confirmed by Jaquese Irving  MD,  Ovid Curd [phone]) on 02/09/2016 5:13:27 PM      MDM   Final diagnoses:  Fall, initial encounter  Intracranial hematoma following injury, left, without loss of consciousness, sequela  Patient with fall and intracranial hemorrhage. He is on Coumadin and is therapeutic. He is on it for paroxysmal A. fib. Fall was around 11:00 last night. He is at his mental baseline. Discussed with Dr. Tamala Julian, the patient's cardiologist and at least for now patient will be off Coumadin. Discussed with Dr. Sherwood Gambler also from neurosurgery. Will reverse patient with case centra. Will admit to medicine and head CT, repeated tomorrow and Dr. Sherwood Gambler called with results. He is not going to do a formal consult, but looked at the CT and wants to be called with the results of the CT tomorrow.  CRITICAL CARE Performed by: Mackie Pai Total critical care time: 30 minutes Critical care time was exclusive of separately billable procedures and treating other patients. Critical care was necessary to treat or prevent imminent or life-threatening deterioration. Critical care was time spent personally by me on the following activities: development of treatment plan with patient and/or surrogate as well as nursing, discussions with consultants, evaluation of patient's response to treatment, examination of patient, obtaining history from patient or surrogate, ordering and performing treatments and interventions, ordering and review of laboratory studies, ordering and review of radiographic studies, pulse oximetry and re-evaluation of patient's condition.   Davonna Belling, MD 02/09/16 [phone]

## 2016-02-10 ENCOUNTER — Observation Stay (HOSPITAL_COMMUNITY): Payer: Medicare Other

## 2016-02-10 ENCOUNTER — Encounter (HOSPITAL_COMMUNITY): Payer: Self-pay | Admitting: Radiology

## 2016-02-10 ENCOUNTER — Other Ambulatory Visit: Payer: Self-pay | Admitting: Geriatric Medicine

## 2016-02-10 DIAGNOSIS — I609 Nontraumatic subarachnoid hemorrhage, unspecified: Secondary | ICD-10-CM

## 2016-02-10 DIAGNOSIS — N183 Chronic kidney disease, stage 3 (moderate): Secondary | ICD-10-CM | POA: Diagnosis not present

## 2016-02-10 DIAGNOSIS — Z7901 Long term (current) use of anticoagulants: Secondary | ICD-10-CM | POA: Diagnosis not present

## 2016-02-10 DIAGNOSIS — I5022 Chronic systolic (congestive) heart failure: Secondary | ICD-10-CM | POA: Diagnosis not present

## 2016-02-10 DIAGNOSIS — S066X9A Traumatic subarachnoid hemorrhage with loss of consciousness of unspecified duration, initial encounter: Secondary | ICD-10-CM | POA: Diagnosis not present

## 2016-02-10 DIAGNOSIS — W19XXXD Unspecified fall, subsequent encounter: Secondary | ICD-10-CM

## 2016-02-10 DIAGNOSIS — W1809XA Striking against other object with subsequent fall, initial encounter: Secondary | ICD-10-CM

## 2016-02-10 LAB — PROTIME-INR
INR: 1.25 (ref 0.00–1.49)
INR: 1.22 (ref 0.00–1.49)
PROTHROMBIN TIME: 15.8 s — AB (ref 11.6–15.2)
Prothrombin Time: 15.5 seconds — ABNORMAL HIGH (ref 11.6–15.2)

## 2016-02-10 LAB — GLUCOSE, CAPILLARY
GLUCOSE-CAPILLARY: 112 mg/dL — AB (ref 65–99)
Glucose-Capillary: 237 mg/dL — ABNORMAL HIGH (ref 65–99)
Glucose-Capillary: 166 mg/dL — ABNORMAL HIGH (ref 65–99)

## 2016-02-10 LAB — COMPREHENSIVE METABOLIC PANEL
ALBUMIN: 3.1 g/dL — AB (ref 3.5–5.0)
ALT: 25 U/L (ref 17–63)
ANION GAP: 6 (ref 5–15)
AST: 29 U/L (ref 15–41)
Alkaline Phosphatase: 105 U/L (ref 38–126)
BILIRUBIN TOTAL: 0.7 mg/dL (ref 0.3–1.2)
BUN: 37 mg/dL — AB (ref 6–20)
CALCIUM: 9.2 mg/dL (ref 8.9–10.3)
CHLORIDE: 108 mmol/L (ref 101–111)
CO2: 25 mmol/L (ref 22–32)
Creatinine, Ser: 2.87 mg/dL — ABNORMAL HIGH (ref 0.61–1.24)
GFR calc Af Amer: 21 mL/min — ABNORMAL LOW (ref >60)
GFR, EST NON AFRICAN AMERICAN: 18 mL/min — AB (ref >60)
GLUCOSE: 105 mg/dL — AB (ref 65–99)
POTASSIUM: 3.6 mmol/L (ref 3.5–5.1)
Sodium: 139 mmol/L (ref 135–145)
TOTAL PROTEIN: 6 g/dL — AB (ref 6.5–8.1)

## 2016-02-10 LAB — TYPE AND SCREEN
ABO/RH(D): AB POS
ANTIBODY SCREEN: NEGATIVE

## 2016-02-10 LAB — TSH: TSH: 1.765 u[IU]/mL (ref 0.350–4.500)

## 2016-02-10 LAB — CBC
HCT: 32.7 % — ABNORMAL LOW (ref 39.0–52.0)
Hemoglobin: 10.9 g/dL — ABNORMAL LOW (ref 13.0–17.0)
MCH: 29.7 pg (ref 26.0–34.0)
MCHC: 33.3 g/dL (ref 30.0–36.0)
MCV: 89.1 fL (ref 78.0–100.0)
PLATELETS: 122 10*3/uL — AB (ref 150–400)
RBC: 3.67 MIL/uL — ABNORMAL LOW (ref 4.22–5.81)
RDW: 13.7 % (ref 11.5–15.5)
WBC: 4.4 10*3/uL (ref 4.0–10.5)

## 2016-02-10 LAB — MRSA PCR SCREENING: MRSA BY PCR: NEGATIVE

## 2016-02-10 LAB — PHOSPHORUS: PHOSPHORUS: 3 mg/dL (ref 2.5–4.6)

## 2016-02-10 LAB — MAGNESIUM: MAGNESIUM: 2 mg/dL (ref 1.7–2.4)

## 2016-02-10 NOTE — Progress Notes (Signed)
Patient ready for discharge, family is concerned that it is too soon and worried about possible active bleed. I have reassured them that the follow up CT has been reviewed and the physicians are confident in there plan of care. IV catheter has been removed and private sitter is getting patient dressed. Wife is at the bedside and is comfortable with discharge. Home Health has been set up and all discharge instructions have been reviewed with wife. Transport has been called for discharge.

## 2016-02-10 NOTE — Progress Notes (Signed)
Patient working with PT, walked in hall with walker. Recommendation has been made for home with home health at this time. Private sitter in room and patient is now up to chair.

## 2016-02-10 NOTE — Care Management Note (Signed)
Case Management Note  Patient Details  Name: Paul KVAM, MD MRN: [id number] Date of Birth: 1923-02-16  Subjective/Objective: ICH, falls   Action/Plan: Discharge Planning: AVS reviewed: NCM spoke to pt and wife at bedside. Pt had caregiver from [facility] at bedside. They provide a private duty aide for 4 hours per day. Pt has RW at home. Offered choice for HH/list provided. Wife states they had [place] in the past. Marcella Dubs for [facility] PT for scheduled dc home today.  PCP- [name] MD   Expected Discharge Date:  02/10/2016             Expected Discharge Plan:  [place]  In-House Referral:  NA  Discharge planning Services  CM Consult  Post Acute Care Choice:  Home Health Choice offered to:  Spouse  DME Arranged:  N/A DME Agency:  NA  HH Arranged:  PT [place] Agency:  [facility] (now [facility])  Status of Service:  Completed, signed off  If discussed at [place] of Stay Meetings, dates discussed:    Additional Comments:  Erenest Rasher, RN 02/10/2016, 2:42 PM

## 2016-02-10 NOTE — Evaluation (Signed)
Physical Therapy Evaluation Patient Details Name: Paul MCKINZIE, MD MRN: [id number] DOB: 09-18-1922 Today's Date: 02/10/2016   History of Present Illness  82 y.o. male admitted to [facility] on 02/09/16 s/p fall with CT scan showed 1.5 cm intraparenchymal bleed.  Pt with significant PMHx of DM, tachy-brady syndrome, L leg DVT, CKD, depression, HTN, CAD, dementia, chronic systolic CHF, and CABG.    Clinical Impression  Pt seems to be close to his baseline mobility level, min assist with RW, however, given his falls he would benefit from further therapy acutely and at f/u to help increase his strength, endurance, safety, and gait stability.   PT to follow acutely for deficits listed below.       Follow Up Recommendations Home health PT;Supervision/Assistance - 24 hour    Equipment Recommendations  None recommended by PT    Recommendations for Other Services   NA    Precautions / Restrictions Precautions Precautions: Fall Precaution Comments: uses walker at baseline, per personal care attendent doesn't have frequent falls at home.      Mobility  Bed Mobility Overal bed mobility: Needs Assistance Bed Mobility: Supine to Sit     Supine to sit: Min assist     General bed mobility comments: Min assist to provide hand to pull to sitting EOB.   Transfers Overall transfer level: Needs assistance Equipment used: Rolling walker (2 wheeled) Transfers: Sit to/from Stand Sit to Stand: Min assist         General transfer comment: Min assist to support trunk to power up especially from lower chair.  Verbal cues for safe hand placement and RW use as pt tends to sit prematurely and about miss his target.  Ambulation/Gait Ambulation/Gait assistance: Min assist Ambulation Distance (Feet): 85 Feet Assistive device: Rolling walker (2 wheeled) Gait Pattern/deviations: Step-through pattern;Shuffle;Trunk flexed Gait velocity: decreased   General Gait Details: Verbal cues for upright posture  and closer proximity to RW.  Pt took a seated rest break due to fatigue and caregiver reports he is very sedentary at home.          Balance Overall balance assessment: Needs assistance Sitting-balance support: Feet supported;Bilateral upper extremity supported Sitting balance-Leahy Scale: Good     Standing balance support: Bilateral upper extremity supported Standing balance-Leahy Scale: Poor                               Pertinent Vitals/Pain Pain Assessment: No/denies pain    Home Living Family/patient expects to be discharged to:: Private residence Living Arrangements: Spouse/significant other Available Help at Discharge: Family;Available 24 hours/day;Personal care attendant Type of Home: House Home Access: Stairs to enter Entrance Stairs-Rails: Right Entrance Stairs-Number of Steps: 4 Home Layout: Two level;Able to live on main level with bedroom/bathroom Home Equipment: Kasandra Knudsen - single point;Shower seat;Walker - 2 wheels      Prior Function Level of Independence: Needs assistance   Gait / Transfers Assistance Needed: Min guard assist with RW  ADL's / Homemaking Assistance Needed: Max to Mod assist for bathing and dressing.        Hand Dominance   Dominant Hand: Right    Extremity/Trunk Assessment   Upper Extremity Assessment: Generalized weakness           Lower Extremity Assessment: Generalized weakness      Cervical / Trunk Assessment: Normal  Communication   Communication: HOH  Cognition Arousal/Alertness: Awake/alert Behavior During Therapy: WFL for tasks assessed/performed Overall Cognitive  Status: History of cognitive impairments - at baseline                         Exercises General Exercises - Upper Extremity Shoulder Flexion: AROM;Both;10 reps Elbow Flexion: AROM;Both;10 reps General Exercises - Lower Extremity Long Arc Quad: AROM;Both;10 reps Hip ABduction/ADduction: AROM;Both;10 reps Hip Flexion/Marching:  AROM;Both;10 reps Toe Raises: AROM;Both;10 reps Heel Raises: AROM;Both;10 reps      Assessment/Plan    PT Assessment Patient needs continued PT services  PT Diagnosis Difficulty walking;Generalized weakness   PT Problem List Decreased strength;Decreased activity tolerance;Decreased balance;Decreased mobility;Decreased knowledge of use of DME;Decreased knowledge of precautions  PT Treatment Interventions DME instruction;Gait training;Stair training;Functional mobility training;Therapeutic activities;Therapeutic exercise;Balance training;Patient/family education;Cognitive remediation;Neuromuscular re-education   PT Goals (Current goals can be found in the Care Plan section) Acute Rehab PT Goals Patient Stated Goal: to go home PT Goal Formulation: With patient/family Time For Goal Achievement: 02/24/16 Potential to Achieve Goals: Good    Frequency Min 3X/week           End of Session Equipment Utilized During Treatment: Gait belt Activity Tolerance: Patient limited by fatigue Patient left: in chair;with call bell/phone within reach;with family/visitor present Nurse Communication: Mobility status    Functional Assessment Tool Used: asssit level Functional Limitation: Mobility: Walking and moving around Mobility: Walking and Moving Around Current Status [phone]): At least 20 percent but less than 40 percent impaired, limited or restricted Mobility: Walking and Moving Around Goal Status [phone]): At least 1 percent but less than 20 percent impaired, limited or restricted    Time: 0924-0953 PT Time Calculation (min) (ACUTE ONLY): 29 min   Charges:   PT Evaluation $PT Eval Moderate Complexity: 1 Procedure PT Treatments $Gait Training: 8-22 mins   PT G Codes:   PT G-Codes **NOT FOR INPATIENT CLASS** Functional Assessment Tool Used: asssit level Functional Limitation: Mobility: Walking and moving around Mobility: Walking and Moving Around Current Status [id number]): At least 20  percent but less than 40 percent impaired, limited or restricted Mobility: Walking and Moving Around Goal Status [phone]): At least 1 percent but less than 20 percent impaired, limited or restricted    Dorla Guizar B. Hoffman, [place], DPT [phone]   02/10/2016, 10:10 AM

## 2016-02-11 ENCOUNTER — Ambulatory Visit
Admission: RE | Admit: 2016-02-11 | Discharge: 2016-02-11 | Disposition: A | Discharge status: Home / Self Care | Payer: Medicare Other | Source: Ambulatory Visit | Attending: Geriatric Medicine | Admitting: Geriatric Medicine

## 2016-02-11 ENCOUNTER — Other Ambulatory Visit: Payer: Self-pay | Admitting: Geriatric Medicine

## 2016-02-11 DIAGNOSIS — W1809XA Striking against other object with subsequent fall, initial encounter: Secondary | ICD-10-CM

## 2016-02-11 DIAGNOSIS — W19XXXA Unspecified fall, initial encounter: Secondary | ICD-10-CM

## 2016-02-11 DIAGNOSIS — W19XXXD Unspecified fall, subsequent encounter: Secondary | ICD-10-CM

## 2016-02-11 LAB — HEMOGLOBIN A1C
HEMOGLOBIN A1C: 7.4 % — AB (ref 4.8–5.6)
MEAN PLASMA GLUCOSE: 166 mg/dL

## 2016-02-11 NOTE — Discharge Summary (Signed)
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 Please follow up with PCP in 1 week 2. Please call cardiology to discuss the need for antiplatelet medication for A fib, neurosurgery Recommendation was to wait at least 2 weeks before starting any antiplatelets.   Follow-up Information    Follow up with [name], MD. Schedule an appointment as soon as possible for a visit in 1 week.   Specialty:  Family Medicine   Contact information:   [address] [place] [place] [address] [phone]       Follow up with Sinclair Grooms, MD. Call in 2 weeks.   Specialty:  Cardiology   Why:  regarding atrial fib.   Contact information:   [id number] [place] [address] [phone]       Follow up with [facility].   Why:  Home Health Physical Therapy   Contact information:   [address]  [place] [address] [phone]      Diet recommendation: regular cardiac diet  Activity: The patient is advised to gradually reintroduce usual activities.  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 DM 2, Left Leg DVT 2 years ago   Presented with yesterday patient had a fall he was evaluated by his primary care provider who obtain CT scan showed 1.5 cm intraparenchymal bleed. Fall wasn't witnessed but patient was found by his wife alert and awake. She reports the fall was mechanical he tripped on something. Denies LOC Patient has baseline dementia appears to have mentation currently at baseline 2 weeks ago patient did had some black stools and at that point his Coumadin was held. Denies any chest pain or shortness of breath"  Hospital Course:  Summary of his active problems in the hospital is as following.  ICH (intracerebral hemorrhage) (HCC) -  Repeat CT head was showing Stable sub cm LEFT posterior insular hematoma versus focal sylvian fissure subarachnoid hemorrhage. It also showed LEFT frontal scalp hematoma. No skull fracture. Pt was admitted to step down, frequent neuro checks were unremarkable.neurosurgery Recommendation was to wait for at least 2 weeks before starting any antiplatelet medication. No follow up was recommended.   .chronic renal failure (HCC)  Stable   . CAD (coronary artery disease) stable chest pain-free. Continue metoprolol  . Chronic systolic CHF (congestive heart failure) (HCC)  Stable, resume home meds.   . Dementia chronic  Stable.   [place] HTN (hypertension) chronic Resume home meds.  . Atrial fibrillation ([place]) -  will stop Coumadin given intracranial bleeding.  Case was discussed with cardiology, who feels the patient should be  off of anticoagulation from now on given risk of falls  Wait at least 2 weeks before resuming any antiplatelet medication.  All other chronic medical condition were stable during the hospitalization.  Patient was seen by physical therapy, who recommended home health, which was arranged by Education officer, museum and case Freight forwarder. On the day of the discharge the patient's vitals  were stable, and no other acute medical condition were reported by patient. the patient was felt safe to be discharge at home with home health.  Procedures and Results:  none   Consultations:  Neurosurgery phone consultation as well as cardiology  DISCHARGE MEDICATION: Discharge Medication List as of 02/10/2016 11:15 AM    CONTINUE these medications which have NOT CHANGED   Details  amLODipine (NORVASC) 10 MG tablet Take 10 mg by mouth daily., Starting 12/31/2014, Until Discontinued, Historical Med    atenolol (TENORMIN) 50 MG tablet Take 50 mg by mouth 2 (two) times daily. , Until Discontinued, Historical Med    buPROPion (WELLBUTRIN) 100 MG tablet Take 1 tablet by mouth daily., Starting 12/09/2014, Until Discontinued, Historical Med    CALCIUM PO Take 1 tablet by mouth daily., Until Discontinued, Historical Med    Coenzyme Q10 (COQ10 PO) Take 1 capsule by mouth daily., Until Discontinued, Historical Med    Docusate Sodium (DOCQLACE PO) Take 2 capsules by mouth 2 (two) times daily., Until Discontinued, Historical Med    donepezil (ARICEPT) 5 MG tablet Take 5 mg by mouth at bedtime., Until Discontinued, Historical Med    feeding supplement, GLUCERNA SHAKE, (GLUCERNA SHAKE) LIQD Take 237 mLs by mouth 3 (three) times daily between meals., Starting 07/07/2015, Until Discontinued, No Print    ferrous sulfate 325 (65 FE) MG tablet Take 1 tablet (325 mg total) by mouth daily with breakfast., Starting 11/15/2015, Until Discontinued, Print    furosemide (LASIX) 20 MG tablet Take 1 tablet (20 mg total) by mouth daily., Starting 11/15/2015, Until Discontinued, Normal    Glucose Blood (FREESTYLE LITE TEST VI) USE AS DIRECTED, Starting 06/02/2013, Until Discontinued, Historical Med    Insulin Detemir (LEVEMIR FLEXPEN) 100 UNIT/ML Pen Inject 10 Units into the skin at bedtime., Starting 11/15/2015, Until Discontinued, Normal    isosorbide-hydrALAZINE (BIDIL) 20-37.5 MG tablet Take 1 tablet by mouth 3  (three) times daily., Starting 11/15/2015, Until Discontinued, Print    linagliptin (TRADJENTA) 5 MG TABS tablet Take 7.5 mg by mouth daily., Until Discontinued, Historical Med    Multiple Vitamins-Minerals (ICAPS PO) Take 1 tablet by mouth 2 (two) times daily., Until Discontinued, Historical Med    ondansetron (ZOFRAN) 4 MG tablet Take 1 tablet (4 mg total) by mouth every 6 (six) hours as needed for nausea or vomiting., Starting 07/06/2015, Until Discontinued, Normal    polyethylene glycol (MIRALAX / GLYCOLAX) packet Take 17 g by mouth every other day. , Until Discontinued, Historical Med    Probiotic Product (ALIGN PO) Take 1 capsule by mouth daily., Until Discontinued, Historical Med    ramelteon (ROZEREM) 8 MG tablet Take 1 tablet (8 mg total) by mouth at bedtime., Starting 11/15/2015, Until Discontinued, Print    SENNA PO Take 1 tablet by mouth daily., Until Discontinued, Historical Med    sertraline (ZOLOFT) 50 MG tablet Take 50 mg by mouth at bedtime. , Until Discontinued, Historical Med      STOP taking these medications     warfarin (COUMADIN) 4 MG tablet        Allergies  Allergen Reactions  . Iodinated Diagnostic Agents Anaphylaxis  .  Iohexol Anaphylaxis     Desc: RN states pt stated he had anyphalactic shock reaction to contrast approx 10 years ago.   . Ace Inhibitors Other (See Comments)    UNKNOWN  . Simvastatin Other (See Comments)    Muscle aches  . Aspirin     Other reaction(s): Abdominal Pain   Discharge Instructions    Call MD for:  persistant dizziness or light-headedness    Complete by:  As directed      Diet - low sodium heart healthy    Complete by:  As directed      Discharge instructions    Complete by:  As directed   It is important that you read following instructions as well as go over your medication list with RN to help you understand your care after this hospitalization.  Discharge Instructions: Please follow-up with PCP in one week  Please  request your primary care physician to go over all Hospital Tests and Procedure/Radiological results at the follow up,  Please get all Hospital records sent to your PCP by signing hospital release before you go home.   Do not drive, operating heavy machinery, perform activities at heights, swimming or participation in water activities or provide baby sitting services; until you have been seen by Primary Care Physician or a Neurologist and advised to do so again. Do not take more than prescribed Pain, Sleep and Anxiety Medications. You were cared for by a hospitalist during your hospital stay. If you have any questions about your discharge medications or the care you received while you were in the hospital after you are discharged, you can call the unit and ask to speak with the hospitalist on call if the hospitalist that took care of you is not available.  Once you are discharged, your primary care physician will handle any further medical issues. Please note that NO REFILLS for any discharge medications will be authorized once you are discharged, as it is imperative that you return to your primary care physician (or establish a relationship with a primary care physician if you do not have one) for your aftercare needs so that they can reassess your need for medications and monitor your lab values. You Must read complete instructions/literature along with all the possible adverse reactions/side effects for all the Medicines you take and that have been prescribed to you. Take any new Medicines after you have completely understood and accept all the possible adverse reactions/side effects. Wear Seat belts while driving. If you have smoked or chewed Tobacco in the last 2 yrs please stop smoking and/or stop any Recreational drug use.     Increase activity slowly    Complete by:  As directed           Discharge Exam: Filed Weights   02/09/16 1800 02/09/16 2305  Weight: 63.05 kg (139 lb) 65.7 kg (144  lb 13.5 oz)   Filed Vitals:   02/10/16 1000 02/10/16 1100  BP:    Pulse: 66 69  Temp:    Resp: 15 18   General: Appear in no distress, no Rash; Oral Mucosa moist. Cardiovascular: S1 and S2 Present, no Murmur, no JVD Respiratory: Bilateral Air entry present and Clear to Auscultation, no Crackles, no wheezes Abdomen: Bowel Sound present, Soft and no tenderness Extremities: no Pedal edema, no calf tenderness Neurology: Grossly no focal neuro deficit.  The results of significant diagnostics from this hospitalization (including imaging, microbiology, ancillary and laboratory) are listed below for reference.  Significant Diagnostic Studies: Ct Head Wo Contrast  02/10/2016  CLINICAL DATA:  Follow-up hemorrhage. History of dementia, diabetes, prostate cancer. EXAM: CT HEAD WITHOUT CONTRAST TECHNIQUE: Contiguous axial images were obtained from the base of the skull through the vertex without intravenous contrast. COMPARISON:  CT HEAD February 09, 2016 FINDINGS: INTRACRANIAL CONTENTS: Less than 1 cm LEFT posterior insula versus sylvian fissure hyperdensity. The ventricles and sulci are normal for age. No mass effect nor midline shift. Confluent supratentorial white matter hypodensities. No acute large vascular territory infarcts. No abnormal extra-axial fluid collections. Basal cisterns are patent. Moderate to severe calcific atherosclerosis of the carotid siphons. ORBITS: The included ocular globes and orbital contents are non-suspicious. Status post bilateral ocular lens implant. SINUSES: Sphenoid sinus mucosal thickening. Mastoid air cells are well aerated. SKULL/SOFT TISSUES: Small LEFT frontotemporal scalp hematoma. No subcutaneous gas or radiopaque foreign bodies. No skull fracture. IMPRESSION: Stable sub cm LEFT posterior insular hematoma versus focal sylvian fissure subarachnoid hemorrhage. LEFT frontal scalp hematoma.  No skull fracture. Moderate to severe global brain atrophy and chronic small  vessel ischemic disease. Electronically Signed   By: Elon Alas M.D.   On: 02/10/2016 06:37   Ct Head Wo Contrast  02/09/2016  CLINICAL DATA:  [place] last p.m. EXAM: CT HEAD WITHOUT CONTRAST TECHNIQUE: Contiguous axial images were obtained from the base of the skull through the vertex without intravenous contrast. COMPARISON:  11/12/2015 FINDINGS: No skull fracture is noted Paranasal sinuses shows mild mucosal thickening bilateral sphenoid sinus. The mastoid air cells are unremarkable. Atherosclerotic calcifications of carotid siphon are noted. Moderate cerebral atrophy. Again noted periventricular and patchy subcortical white matter decreased attenuation consistent with chronic small vessel ischemic changes. Axial image 15 there is focal intraparenchymal hemorrhage in left temporal lobe measures about 1.5 cm. No mass effect or midline shift. No intraventricular hemorrhage. IMPRESSION: 1. There is focal intraparenchymal hemorrhage in left temporal lobe measures 1.5 cm without mass effect or surrounding edema. Stable atrophy and chronic white matter disease. No intraventricular hemorrhage. Electronically Signed   By: Lahoma Crocker M.D.   On: 02/09/2016 15:39    Microbiology: Recent Results (from the past 240 hour(s))  MRSA PCR Screening     Status: None   Collection Time: 02/09/16 10:46 PM  Result Value Ref Range Status   MRSA by PCR NEGATIVE NEGATIVE Final    Comment:        The GeneXpert MRSA Assay (FDA approved for NASAL specimens only), is one component of a comprehensive MRSA colonization surveillance program. It is not intended to diagnose MRSA infection nor to guide or monitor treatment for MRSA infections.      Labs: CBC:  Recent Labs Lab 02/09/16 1723 02/09/16 1726 02/10/16 0815  WBC  --  4.9 4.4  HGB 12.2* 11.7* 10.9*  HCT 36.0* 34.9* 32.7*  MCV  --  89.5 89.1  PLT  --  141* 123XX123*   Basic Metabolic Panel:  Recent Labs Lab 02/09/16 1723 02/09/16 1726  02/10/16 0815  NA 139 137 139  K 4.0 3.9 3.6  CL 104 105 108  CO2  --  24 25  GLUCOSE 168* 168* 105*  BUN 36* 36* 37*  CREATININE 2.70* 2.78* 2.87*  CALCIUM  --  9.6 9.2  MG  --   --  2.0  PHOS  --   --  3.0   Liver Function Tests:  Recent Labs Lab 02/10/16 0815  AST 29  ALT 25  ALKPHOS 105  BILITOT 0.7  PROT  6.0*  ALBUMIN 3.1*   No results for input(s): LIPASE, AMYLASE in the last 168 hours. No results for input(s): AMMONIA in the last 168 hours. Cardiac Enzymes: No results for input(s): CKTOTAL, CKMB, CKMBINDEX, TROPONINI in the last 168 hours. BNP (last 3 results)  Recent Labs  11/12/15 1103  BNP 3358.8*   CBG:  Recent Labs Lab 02/09/16 2116 02/10/16 0716 02/10/16 1105  GLUCAP 237* 112* 166*   Time spent: 30 minutes  Signed:  PATEL, PRANAV  [facility] 02/10/2016   , 10:58 AM

## 2016-02-20 ENCOUNTER — Ambulatory Visit
Admission: RE | Admit: 2016-02-20 | Discharge: 2016-02-20 | Disposition: A | Discharge status: Home / Self Care | Payer: Medicare Other | Source: Ambulatory Visit | Attending: Geriatric Medicine | Admitting: Geriatric Medicine

## 2016-02-20 DIAGNOSIS — W19XXXA Unspecified fall, initial encounter: Secondary | ICD-10-CM

## 2016-03-08 ENCOUNTER — Encounter: Payer: Self-pay | Admitting: Nurse Practitioner

## 2016-03-08 ENCOUNTER — Ambulatory Visit (INDEPENDENT_AMBULATORY_CARE_PROVIDER_SITE_OTHER): Payer: Medicare Other | Admitting: Nurse Practitioner

## 2016-03-08 ENCOUNTER — Ambulatory Visit (INDEPENDENT_AMBULATORY_CARE_PROVIDER_SITE_OTHER): Payer: Medicare Other | Admitting: *Deleted

## 2016-03-08 ENCOUNTER — Encounter: Payer: Self-pay | Admitting: Internal Medicine

## 2016-03-08 VITALS — BP 160/78 | HR 75 | Ht 70.5 in | Wt 139.8 lb

## 2016-03-08 DIAGNOSIS — Z950 Presence of cardiac pacemaker: Secondary | ICD-10-CM | POA: Diagnosis not present

## 2016-03-08 DIAGNOSIS — I480 Paroxysmal atrial fibrillation: Secondary | ICD-10-CM | POA: Diagnosis not present

## 2016-03-08 DIAGNOSIS — Z7901 Long term (current) use of anticoagulants: Secondary | ICD-10-CM

## 2016-03-08 DIAGNOSIS — I2510 Atherosclerotic heart disease of native coronary artery without angina pectoris: Secondary | ICD-10-CM | POA: Diagnosis not present

## 2016-03-08 LAB — CUP PACEART INCLINIC DEVICE CHECK
Battery Impedance: 810 Ohm
Battery Remaining Longevity: 53 mo
Battery Voltage: 2.78 V
Brady Statistic AP VP Percent: 100 %
Brady Statistic AS VS Percent: 0 %
Implantable Lead Implant Date: 20050819
Implantable Lead Implant Date: 20050819
Implantable Lead Location: 753859
Implantable Lead Model: 5076
Lead Channel Impedance Value: 418 Ohm
Lead Channel Impedance Value: 470 Ohm
Lead Channel Pacing Threshold Pulse Width: 0.4 ms
Lead Channel Sensing Intrinsic Amplitude: 0.35 mV
Lead Channel Setting Pacing Amplitude: 2.25 V
Lead Channel Setting Pacing Amplitude: 2.75 V
Lead Channel Setting Pacing Pulse Width: 0.4 ms
MDC IDC LEAD LOCATION: 753860
MDC IDC MSMT LEADCHNL RA PACING THRESHOLD AMPLITUDE: 1.5 V
MDC IDC MSMT LEADCHNL RV PACING THRESHOLD AMPLITUDE: 1 V
MDC IDC MSMT LEADCHNL RV PACING THRESHOLD PULSEWIDTH: 0.4 ms
MDC IDC SESS DTM: 20170807141713
MDC IDC SET LEADCHNL RV SENSING SENSITIVITY: 2.8 mV
MDC IDC STAT BRADY AP VS PERCENT: 0 %
MDC IDC STAT BRADY AS VP PERCENT: 0 %

## 2016-03-08 NOTE — Patient Instructions (Addendum)
We will be checking the following labs today - NONE   Medication Instructions:    Continue with your current medicines.     Testing/Procedures To Be Arranged:  N/A  Follow-Up:   See me in 6 months with pacemaker check.     Other Special Instructions:   N/A    If you need a refill on your cardiac medications before your next appointment, please call your pharmacy.   Call the [place] office at [phone] if you have any questions, problems or concerns.

## 2016-03-08 NOTE — Progress Notes (Signed)
CARDIOLOGY OFFICE NOTE  Date:  03/08/2016    Paul Rounds, MD Date of Birth: Mar 24, 1923 Medical Record [id number]  PCP:  [name], MD  Cardiologist:  [facility]  Chief Complaint  Patient presents with  . Coronary Artery Disease  . Atrial Fibrillation    Post hospital visit - seen for Dr. 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 Seen for Dr. Tamala Julian.   He has a history of CAD with CABG in 1991 and redo in [id number], chronic systolic HF, CKD, dementia, depression, DM, HTN, prior DVT, tachy brady with PPM in place and PAF. He was previously on coumadin.   Last seen here back in April - cardiac status was felt to be stable. He had had a recent CHF admission. His main issue seemed to be worsening dementia with behavior issues. He was to be seen back as needed.   Admitted last month after an unwitnessed fall - CT had 1.5cm intraparenchymal bleed. Noted that 2 weeks prior to this - had black stools and Coumadin was held. It was noted in the discharge summary that his case was discussed with cardiology who felts the patient should be off of anticoagulation from now on. There is no note from cardiology that I can see in EPIC. INR on admission was 2.42.   Apparently now here to discuss resuming "any antiplatelet medication".   Follow up CT 11 days post discharge notes resolution of the bleed.   Comes in today. Here with his wife. Using a walker. Very hard of hearing. Not clear when his pacemaker was last checked. Seems to be back to his baseline. No more falls. He looks to her for all answers to my questions. No chest pain. Seems fairly comfortable. He is losing weight. Looks like his anemia was just treated with some iron tablets. She notes he has had a history of PUD in the past.     Past Medical History:  Diagnosis Date  . CAD (coronary artery disease)    a. CABG  1991 and 2006 LIMA LAD, seq. SVG D1-Cfx, SVG d2,and seq. SVG AM-PDA   . Chronic systolic CHF (congestive heart failure) ([place])   . CKD (chronic kidney disease)   . Dementia   . Depression   . Diabetes mellitus   . HTN (hypertension)   . Left leg DVT ([place])   . Prostate cancer ([place])   . Tachycardia-bradycardia syndrome [facility])     Past Surgical History:  Procedure Laterality Date  . CORONARY ARTERY BYPASS GRAFT    . PROSTATECTOMY       Medications: Current Outpatient Prescriptions  Medication Sig Dispense Refill  . amLODipine (NORVASC) 10 MG tablet Take 10 mg by mouth daily.  4  . atenolol (TENORMIN) 50 MG tablet Take 50 mg by mouth 2 (two) times daily.     Marland Kitchen buPROPion (WELLBUTRIN) 100 MG tablet Take 1 tablet by mouth daily.  2  . CALCIUM PO Take 1 tablet by mouth daily.    . Coenzyme Q10 (COQ10 PO) Take 1 capsule by mouth daily.    Mariane Baumgarten Sodium (DOCQLACE PO) Take 2 capsules by mouth 2 (two) times daily.    Marland Kitchen donepezil (ARICEPT) 5 MG tablet Take 5 mg by mouth at bedtime.    . feeding supplement, GLUCERNA SHAKE, (GLUCERNA SHAKE) LIQD Take 237 mLs by mouth 3 (three) times  daily as needed (feeding supplement).    . ferrous sulfate 325 (65 FE) MG tablet Take 1 tablet (325 mg total) by mouth daily with breakfast. 30 tablet 1  . furosemide (LASIX) 20 MG tablet Take 1 tablet (20 mg total) by mouth daily. 30 tablet 2  . Glucose Blood (FREESTYLE LITE TEST VI) USE AS DIRECTED    . Insulin Detemir (LEVEMIR FLEXPEN) 100 UNIT/ML Pen Inject 10 Units into the skin at bedtime. 15 mL 11  . isosorbide-hydrALAZINE (BIDIL) 20-37.5 MG tablet Take 1 tablet by mouth 3 (three) times daily. 90 tablet 1  . linagliptin (TRADJENTA) 5 MG TABS tablet Take 7.5 mg by mouth daily.    . Multiple Vitamins-Minerals (ICAPS PO) Take 1 tablet by mouth 2 (two) times daily.    . ondansetron (ZOFRAN) 4 MG tablet Take 1 tablet (4 mg total) by mouth every 6 (six) hours as needed for nausea or vomiting. 20 tablet 0  .  polyethylene glycol (MIRALAX / GLYCOLAX) packet Take 17 g by mouth daily as needed for moderate constipation.    . Probiotic Product (ALIGN PO) Take 1 capsule by mouth daily.    . ramelteon (ROZEREM) 8 MG tablet Take 8 mg by mouth at bedtime as needed for sleep.    . SENNA PO Take 1 tablet by mouth daily.    . sertraline (ZOLOFT) 50 MG tablet Take 50 mg by mouth at bedtime.      No current facility-administered medications for this visit.     Allergies: Allergies  Allergen Reactions  . Iodinated Diagnostic Agents Anaphylaxis  . Iohexol Anaphylaxis     Desc: RN states pt stated he had anyphalactic shock reaction to contrast approx 10 years ago.   . Ace Inhibitors Other (See Comments)    UNKNOWN  . Simvastatin Other (See Comments)    Muscle aches  . Aspirin     Other reaction(s): Abdominal Pain    Social History: The patient  reports that he has never smoked. He has never used smokeless tobacco. He reports that he does not drink alcohol or use drugs.   Family History: The patient's family history includes Diabetes in his mother; Heart disease in his sister, sister, sister, and sister.   Review of Systems: Please see the history of present illness.   Otherwise, the review of systems is positive for none.   All other systems are reviewed and negative.   Physical Exam: VS:  BP (!) 160/78   Pulse 75   Ht 5' 10.5" (1.791 m)   Wt 139 lb 12.8 oz (63.4 kg)   SpO2 100% Comment: at rest  BMI 19.78 kg/m  .  BMI Body mass index is 19.78 kg/m.  Wt Readings from Last 3 Encounters:  03/08/16 139 lb 12.8 oz (63.4 kg)  02/09/16 144 lb 13.5 oz (65.7 kg)  11/24/15 149 lb 1.9 oz (67.6 kg)   BP is 150/80  General: Pleasant. Elderly male who is alert but seems to be confused. He is in no acute distress.  He is quite thin.  HEENT: Normal.  Neck: Supple, no JVD, carotid bruits, or masses noted.  Cardiac: Regular rate and rhythm. No murmurs, rubs, or gallops. No edema.  Respiratory:  Lungs  are clear to auscultation bilaterally with normal work of breathing.  GI: Soft and nontender.  MS: No deformity or atrophy. Gait and ROM intact.  Skin: Warm and dry. Color is normal.  Neuro:  Strength and sensation are intact and no gross focal  deficits noted.  Psych: Alert, appropriate and with normal affect.   LABORATORY DATA:  EKG:  EKG is not ordered today.   Lab Results  Component Value Date   WBC 4.4 02/10/2016   HGB 10.9 (L) 02/10/2016   HCT 32.7 (L) 02/10/2016   PLT 122 (L) 02/10/2016   GLUCOSE 105 (H) 02/10/2016   ALT 25 02/10/2016   AST 29 02/10/2016   NA 139 02/10/2016   K 3.6 02/10/2016   CL 108 02/10/2016   CREATININE 2.87 (H) 02/10/2016   BUN 37 (H) 02/10/2016   CO2 25 02/10/2016   TSH 1.765 02/10/2016   PSA (H) 09/23/2009    26.27 (NOTE) Result repeated and verified. Test Methodology: [employer] PSA   INR 1.22 02/10/2016   HGBA1C 7.4 (H) 02/10/2016    BNP (last 3 results)  Recent Labs  11/12/15 1103  BNP 3,358.8*    ProBNP (last 3 results) No results for input(s): PROBNP in the last 8760 hours.   Other Studies Reviewed Today:   CT HEAD IMPRESSION FROM 02/20/16: Compare to the prior CT there has been resolution of the left perisylvian subarachnoid hemorrhage, with no evidence on the current CT of acute intracranial abnormality.  Re- demonstration of chronic white matter disease, senescent brain volume loss, and intracranial atherosclerosis.  Signed,  Dulcy Fanny. Earleen Newport, DO [facility] Radiology Electronically Signed   By: Corrie Mckusick D.O.   On: 02/20/2016 16:27    Echo Study Conclusions from 11/2015  - Left ventricle: The cavity size was normal. Wall thickness was   increased in a pattern of mild LVH. Systolic function was mildly   to moderately reduced. The estimated ejection fraction was in the   range of 40% to 45%. - Aortic valve: Severely calcified annulus. There was very  mild   stenosis. Valve area (VTI): 1.97 cm^2. Valve area (Vmax): 2.02   cm^2. Valve area (Vmean): 2.08 cm^2. - Mitral valve: Moderately calcified annulus. There was moderate   regurgitation. - Left atrium: The atrium was moderately dilated. - Right atrium: The atrium was mildly dilated. - Tricuspid valve: There was moderate regurgitation. - Pulmonary arteries: Systolic pressure was moderately increased.   PA peak pressure: 49 mm Hg (S).  Assessment/Plan: 1. Fall with subsequent intraparenchymal bleed - resolved on repeat CT scan - he is at high risk for recurrent falls given his age and dementia and has had recent worsening anemia - would favor NOT restarting anticoagulation.  Fortunately, he remains in NSR.   2. Chronic combined systolic and diastolic HF (heart failure) (HCC) Volume status looks ok.   3. Coronary artery disease involving native coronary artery of native heart without angina pectoris Asymptomatic  4. Paroxysmal atrial fibrillation (HCC) No evidence of recurrent A. Fib noted  5. Chronic anticoagulation Now off due to fall and intracranial hemorrhage - would not restart.   6. Essential hypertension Upper normal  7. CKD (chronic kidney disease)  8. Dementia, with behavioral disturbance Progressive and also impacting his personality and psychological state. Now losing weight and sleeping more.   8. PPM - not clear to me when this was last checked. Spoke to the team from device clinic - not checked in almost one year - device is checked today - he has 5 years of battery life. One mode switch but no EGM. He is primarily AV paced.   Current medicines are reviewed with the patient today.  The patient does not have concerns regarding medicines other than  what has been noted above.  The following changes have been made:  See above.  Labs/ tests ordered today include:   No orders of the defined types were placed in this encounter.    Disposition:   FU with me  in 6 months.   Patient is agreeable to this plan and will call if any problems develop in the interim.   Signed: Burtis Junes, RN, ANP-C 03/08/2016 12:13 PM  [facility] [address] [place], [place]  [address] Phone: [phone] Fax: [phone]

## 2016-03-08 NOTE — Progress Notes (Addendum)
Pacemaker check in clinic. Normal device function. Thresholds, sensing, impedances consistent with previous measurements. Device programmed to maximize longevity. 1 mode switch, (<0.1 burden). No high ventricular rates noted. Device programmed at appropriate safety margins. Histogram distribution appropriate for patient activity level. Device programmed to optimize intrinsic conduction. Estimated longevity 3-13yrs. ROV with GT in 23mo and with LG in 12mo.

## 2016-03-16 ENCOUNTER — Encounter: Payer: Self-pay | Admitting: Nurse Practitioner

## 2016-03-23 ENCOUNTER — Other Ambulatory Visit: Payer: Self-pay | Admitting: Internal Medicine

## 2016-03-24 ENCOUNTER — Telehealth: Payer: Self-pay

## 2016-03-24 NOTE — Telephone Encounter (Signed)
Copy of pt's labs received from pt home visiting physician [place]. Per Dr.Smith labs look stable, we should call the pt home to get an update on how he is doing. Called and spoke with pt wife Mrs. Ozer, she sts that pt is doing well, nothing needed at this time. Adv her I would fwd the update to [place]. Mrs. Fintel voiced appreciation for the call.

## 2016-04-01 ENCOUNTER — Encounter: Payer: Self-pay | Admitting: Interventional Cardiology

## 2016-04-27 DIAGNOSIS — H9193 Unspecified hearing loss, bilateral: Secondary | ICD-10-CM | POA: Insufficient documentation

## 2016-04-29 ENCOUNTER — Other Ambulatory Visit: Payer: Self-pay | Admitting: Geriatric Medicine

## 2016-04-29 DIAGNOSIS — I629 Nontraumatic intracranial hemorrhage, unspecified: Secondary | ICD-10-CM

## 2016-04-29 DIAGNOSIS — I6200 Nontraumatic subdural hemorrhage, unspecified: Secondary | ICD-10-CM

## 2016-05-04 ENCOUNTER — Other Ambulatory Visit: Payer: Self-pay

## 2016-05-07 ENCOUNTER — Other Ambulatory Visit: Payer: Self-pay

## 2016-05-13 ENCOUNTER — Other Ambulatory Visit: Payer: Self-pay | Admitting: Geriatric Medicine

## 2016-05-13 DIAGNOSIS — I629 Nontraumatic intracranial hemorrhage, unspecified: Secondary | ICD-10-CM

## 2016-05-13 DIAGNOSIS — I6200 Nontraumatic subdural hemorrhage, unspecified: Secondary | ICD-10-CM

## 2016-05-19 ENCOUNTER — Other Ambulatory Visit: Payer: Self-pay

## 2016-06-01 ENCOUNTER — Encounter: Payer: Self-pay | Admitting: Physician Assistant

## 2016-06-15 NOTE — Progress Notes (Signed)
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 Tamala Julian  Electrophysiologist: Dr. 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   He was last seen by cardiology service, L. Servando Snare, NP in August, she noted "Admitted last month after an unwitnessed fall - CT had 1.5cm intraparenchymal bleed. Noted that 2 weeks prior to this - had black stools and Coumadin was held. It was noted in the discharge summary that his case was discussed with cardiology who felts the patient should be off of anticoagulation from now on. There is no note from cardiology that I can see in EPIC. INR on admission was 2.42."  With that visit, given advanced age, fall with headt trauma/bleed, dementia and a recent GIB favored not resuming a/c.  He comes in today to be seen for Dr. Lovena Le, overdue for his device check.  He was last seen by him April 2017 during a hospital stay for the purpose of evaluating his pacer function (was working well/normally).  He is feeling well, comes accompanied by his wife.  He denies any kind of CP, palpitations or SOB, no dizziness, near syncope or syncope.  He sees his PMD usually monthly of q 32months, has labs done routinely there.  They prefer to come in to have his pacer checks.   Device information: MDT dual chamber PPM, implanted 03/20/04, Gen change 10/01/09, Dr. Lovena Le, 2nd degree AVblock   Past Medical History:  Diagnosis Date  . CAD (coronary artery disease)    a. CABG 1991 and 2006 LIMA LAD, seq. SVG D1-Cfx, SVG d2,and seq. SVG AM-PDA   . Chronic systolic CHF (congestive heart failure) ([place])   . CKD (chronic kidney disease)   . Dementia   . Depression   .  Diabetes mellitus   . HTN (hypertension)   . Left leg DVT ([place])   . Prostate cancer ([place])   . Tachycardia-bradycardia syndrome [facility])     Past Surgical History:  Procedure Laterality Date  . CORONARY ARTERY BYPASS GRAFT    . PROSTATECTOMY      Current Outpatient Prescriptions  Medication Sig Dispense Refill  . atenolol (TENORMIN) 50 MG tablet Take 50 mg by mouth 2 (two) times daily.     Marland Kitchen buPROPion (WELLBUTRIN) 100 MG tablet Take 1 tablet by mouth daily.  2  . CALCIUM PO Take 1 tablet by mouth daily.    . Coenzyme Q10 (COQ10 PO) Take 1 capsule by mouth daily.    Mariane Baumgarten Sodium (DOCQLACE PO) Take 2 capsules by mouth 2 (two) times daily.    Marland Kitchen donepezil (ARICEPT) 5 MG tablet Take 5 mg by mouth at bedtime.    . feeding supplement, GLUCERNA SHAKE, (GLUCERNA SHAKE) LIQD Take 237 mLs by mouth 3 (three) times daily as needed (feeding supplement).    . ferrous sulfate 325 (65 FE) MG tablet Take 1 tablet (325 mg total) by mouth daily with breakfast. 30 tablet 1  . furosemide (LASIX) 20 MG tablet Take 1 tablet (20 mg total) by mouth daily. 30 tablet 2  . Glucose Blood (FREESTYLE LITE TEST VI) USE AS DIRECTED    . Insulin Detemir (LEVEMIR FLEXPEN) 100 UNIT/ML Pen Inject 10 Units into  the skin at bedtime. 15 mL 11  . isosorbide-hydrALAZINE (BIDIL) 20-37.5 MG tablet Take 1 tablet by mouth 3 (three) times daily. 90 tablet 1  . linagliptin (TRADJENTA) 5 MG TABS tablet Take 7.5 mg by mouth daily.    . Multiple Vitamins-Minerals (ICAPS PO) Take 1 tablet by mouth 2 (two) times daily.    . ondansetron (ZOFRAN) 4 MG tablet Take 1 tablet (4 mg total) by mouth every 6 (six) hours as needed for nausea or vomiting. 20 tablet 0  . polyethylene glycol (MIRALAX / GLYCOLAX) packet Take 17 g by mouth daily as needed for moderate constipation.    . Probiotic Product (ALIGN PO) Take 1 capsule by mouth daily.    . ramelteon (ROZEREM) 8 MG tablet Take 8 mg by mouth at bedtime as needed for sleep.    . SENNA PO Take  1 tablet by mouth daily.    . sertraline (ZOLOFT) 50 MG tablet Take 50 mg by mouth at bedtime.      No current facility-administered medications for this visit.     Allergies:   Iodinated diagnostic agents; Iohexol; Ace inhibitors; Simvastatin; and Aspirin   Social History:  The patient  reports that he has never smoked. He has never used smokeless tobacco. He reports that he does not drink alcohol or use drugs.   Family History:  The patient's family history includes Diabetes in his mother; Heart disease in his sister, sister, sister, and sister.  ROS:  Please see the history of present illness. All other systems are reviewed and otherwise negative.   PHYSICAL EXAM:  VS:  BP (!) 164/82   Pulse 88   Ht 5' 10.5" (1.791 m)   Wt 146 lb (66.2 kg)   BMI 20.65 kg/m  BMI: Body mass index is 20.65 kg/m. Well nourished, well developed, in no acute distress  HEENT: normocephalic, atraumatic  Neck: no JVD, carotid bruits or masses Cardiac:  RRR; no significant murmurs, no rubs, or gallops Lungs:  clear to auscultation bilaterally, no wheezing, rhonchi or rales  Abd: soft, nontender MS: no deformity or atrophy Ext: no edema  Skin: warm and dry, no rash Neuro:  No gross deficits appreciated Psych: euthymic mood, full affect  PPM site is stable, no tethering or discomfort   EKG:  Done 02/11/16, reviewed today by myself showed AV paced rhythm PPM interrogation done today and reviewed by myself: stable battery and lead measurements, no observations, He is pacer dependent today at 40bpm   11/14/15: TTE Study Conclusions - Left ventricle: The cavity size was normal. Wall thickness was   increased in a pattern of mild LVH. Systolic function was mildly   to moderately reduced. The estimated ejection fraction was in the   range of 40% to 45%. - Aortic valve: Severely calcified annulus. There was very mild   stenosis. Valve area (VTI): 1.97 cm^2. Valve area (Vmax): 2.02   cm^2. Valve area  (Vmean): 2.08 cm^2. - Mitral valve: Moderately calcified annulus. There was moderate   regurgitation. - Left atrium: The atrium was moderately dilated. - Right atrium: The atrium was mildly dilated. - Tricuspid valve: There was moderate regurgitation. - Pulmonary arteries: Systolic pressure was moderately increased.   PA peak pressure: 49 mm Hg (S).  Recent Labs: 11/12/2015: B Natriuretic Peptide 3,358.8 02/10/2016: ALT 25; BUN 37; Creatinine, Ser 2.87; Hemoglobin 10.9; Magnesium 2.0; Platelets 122; Potassium 3.6; Sodium 139; TSH 1.765  No results found for requested labs within last 8760 hours.   CrCl  cannot be calculated (Patient's most recent lab result is older than the maximum 21 days allowed.).   Wt Readings from Last 3 Encounters:  06/16/16 146 lb (66.2 kg)  03/08/16 139 lb 12.8 oz (63.4 kg)  02/09/16 144 lb 13.5 oz (65.7 kg)     Other studies reviewed: Additional studies/records reviewed today include: summarized above  ASSESSMENT AND PLAN:  1. Tachy/brady syndrome w/PPM     normal device function, programmed V output to 2.5V    He prefers to come in for his pacer checks, will schedule 50months  2. PAFib     CHA2DS2Vasc is at least 5, no longer on a/c      advanced age, fall with headt trauma/bleed, dementia and a recent GIB  3. Chronic CHF (systolic)     euvolemic by exam, no symptoms    On BB, Bidil, no ACE/ARB given renal dysfunction  4. CAD     stable without symptoms     C/w Dr. Tamala Julian  5. HTN     High today but left the house without taking his morning meds  Disposition: Dr. Tamala Julian as scheduled, Dr. Lovena Le in 1 year, sooner if needed  Current medicines are reviewed at length with the patient today.  The patient did not have any concerns regarding medicines.  Haywood Lasso, PA-C 06/16/2016 12:38 PM     [place] [address] [phone] (office)  [phone] (fax)

## 2016-06-16 ENCOUNTER — Ambulatory Visit (INDEPENDENT_AMBULATORY_CARE_PROVIDER_SITE_OTHER): Payer: Medicare Other | Admitting: Physician Assistant

## 2016-06-16 ENCOUNTER — Encounter (INDEPENDENT_AMBULATORY_CARE_PROVIDER_SITE_OTHER): Payer: Self-pay

## 2016-06-16 VITALS — BP 164/82 | HR 88 | Ht 70.5 in | Wt 146.0 lb

## 2016-06-16 DIAGNOSIS — I441 Atrioventricular block, second degree: Secondary | ICD-10-CM | POA: Diagnosis not present

## 2016-06-16 DIAGNOSIS — I5022 Chronic systolic (congestive) heart failure: Secondary | ICD-10-CM

## 2016-06-16 DIAGNOSIS — I480 Paroxysmal atrial fibrillation: Secondary | ICD-10-CM | POA: Diagnosis not present

## 2016-06-16 DIAGNOSIS — I4891 Unspecified atrial fibrillation: Secondary | ICD-10-CM | POA: Diagnosis not present

## 2016-06-16 DIAGNOSIS — I10 Essential (primary) hypertension: Secondary | ICD-10-CM

## 2016-06-16 DIAGNOSIS — I2510 Atherosclerotic heart disease of native coronary artery without angina pectoris: Secondary | ICD-10-CM

## 2016-06-16 NOTE — Patient Instructions (Signed)
Medication Instructions:   Your physician recommends that you continue on your current medications as directed. Please refer to the Current Medication list given to you today.   If you need a refill on your cardiac medications before your next appointment, please call your pharmacy.  Labwork: NONE ORDERED  TODAY    Testing/Procedures: NONE ORDERED  TODAY    Follow-Up: Your physician wants you to follow-up in:  IN  [place] clinic  You will receive a reminder letter in the mail two months in advance. If you don't receive a letter, please call our office to schedule the follow-up appointment.   Your physician wants you to follow-up in: [place] will receive a reminder letter in the mail two months in advance. If you don't receive a letter, please call our office to schedule the follow-up appointment.     Any Other Special Instructions Will Be Listed Below (If Applicable).

## 2016-07-20 ENCOUNTER — Encounter: Payer: Self-pay | Admitting: Internal Medicine

## 2016-08-24 ENCOUNTER — Encounter: Payer: Self-pay | Admitting: Interventional Cardiology

## 2016-08-24 ENCOUNTER — Ambulatory Visit (INDEPENDENT_AMBULATORY_CARE_PROVIDER_SITE_OTHER): Payer: Medicare Other | Admitting: Interventional Cardiology

## 2016-08-24 VITALS — BP 142/74 | HR 73 | Ht 70.5 in | Wt 150.0 lb

## 2016-08-24 DIAGNOSIS — I2510 Atherosclerotic heart disease of native coronary artery without angina pectoris: Secondary | ICD-10-CM | POA: Diagnosis not present

## 2016-08-24 DIAGNOSIS — Z7901 Long term (current) use of anticoagulants: Secondary | ICD-10-CM

## 2016-08-24 DIAGNOSIS — F0391 Unspecified dementia with behavioral disturbance: Secondary | ICD-10-CM

## 2016-08-24 DIAGNOSIS — N184 Chronic kidney disease, stage 4 (severe): Secondary | ICD-10-CM

## 2016-08-24 DIAGNOSIS — I10 Essential (primary) hypertension: Secondary | ICD-10-CM

## 2016-08-24 DIAGNOSIS — I480 Paroxysmal atrial fibrillation: Secondary | ICD-10-CM

## 2016-08-24 DIAGNOSIS — Z950 Presence of cardiac pacemaker: Secondary | ICD-10-CM

## 2016-08-24 DIAGNOSIS — I5022 Chronic systolic (congestive) heart failure: Secondary | ICD-10-CM | POA: Diagnosis not present

## 2016-08-24 NOTE — Progress Notes (Signed)
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 Coronary Artery Disease  . 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 Chronic kidney disease stage IV. He has progressive and significant dementia and hearing loss.  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 He is accompanied by his wife [name]. They spent 4 weeks over the Christmas holidays with her daughter, [name], in the [place] area. They had a great time and no medical problems. He stays in bed most of the day. He will get up to eat and then go back to bed. He reads occasionally. He has not had falls or other mishaps. He denies chest discomfort and dyspnea. There is been no significant swelling.   Past Medical History:  Diagnosis Date  . CAD (coronary artery disease)    a. CABG 1991 and 2006 LIMA LAD, seq. SVG D1-Cfx, SVG d2,and seq. SVG AM-PDA   . Chronic systolic CHF (congestive heart failure) ([place])   . CKD (chronic kidney disease)   . Dementia   . Depression   . Diabetes mellitus   . HTN (hypertension)   . Left leg DVT ([place])   . Prostate cancer ([place])   . Tachycardia-bradycardia syndrome [facility])     Past Surgical History:  Procedure Laterality Date  . CORONARY ARTERY BYPASS GRAFT    . PROSTATECTOMY      Current Medications: Outpatient Medications Prior to Visit  Medication Sig Dispense Refill  . atenolol (TENORMIN) 50 MG tablet Take 50 mg by mouth 2 (two) times daily.     Marland Kitchen buPROPion (WELLBUTRIN) 100 MG tablet Take 1 tablet by mouth daily.  2  . CALCIUM PO Take 1 tablet  by mouth daily.    . Coenzyme Q10 (COQ10 PO) Take 1 capsule by mouth daily.    Mariane Baumgarten Sodium (DOCQLACE PO) Take 2 capsules by mouth 2 (two) times daily.    Marland Kitchen donepezil (ARICEPT) 5 MG tablet Take 5 mg by mouth at bedtime.    . feeding supplement, GLUCERNA SHAKE, (GLUCERNA SHAKE) LIQD Take 237 mLs by mouth 3 (three) times daily as needed (feeding supplement).    . ferrous sulfate 325 (65 FE) MG tablet Take 1 tablet (325 mg total) by mouth daily with breakfast. 30 tablet 1  . furosemide (LASIX) 20 MG tablet Take 1 tablet (20 mg total) by mouth daily. 30 tablet 2  . Glucose Blood (FREESTYLE LITE TEST VI) USE AS DIRECTED    . isosorbide-hydrALAZINE (BIDIL) 20-37.5 MG tablet Take 1 tablet by mouth 3 (three) times daily. 90 tablet 1  . linagliptin (TRADJENTA) 5 MG TABS tablet Take 7.5 mg by mouth daily.    . Multiple Vitamins-Minerals (ICAPS PO) Take 1 tablet by mouth 2 (two) times daily.    . ondansetron (ZOFRAN) 4 MG tablet Take 1 tablet (4 mg total) by mouth every 6 (six) hours as needed for nausea or vomiting. 20 tablet 0  . polyethylene glycol (MIRALAX / GLYCOLAX) packet Take 17 g by mouth daily as needed for moderate constipation.    . Probiotic Product (ALIGN PO)  Take 1 capsule by mouth daily.    . ramelteon (ROZEREM) 8 MG tablet Take 8 mg by mouth at bedtime as needed for sleep.    . SENNA PO Take 1 tablet by mouth daily.    . sertraline (ZOLOFT) 50 MG tablet Take 50 mg by mouth at bedtime.     . Insulin Detemir (LEVEMIR FLEXPEN) 100 UNIT/ML Pen Inject 10 Units into the skin at bedtime. (Patient not taking: Reported on 08/24/2016) 15 mL 11   No facility-administered medications prior to visit.      Allergies:   Iodinated diagnostic agents; Iohexol; Ace inhibitors; Simvastatin; and Aspirin   Social History   Social History  . Marital status: Married    Spouse name: N/A  . Number of children: N/A  . Years of education: N/A   Social History Main Topics  . Smoking status: Never  Smoker  . Smokeless tobacco: Never Used  . Alcohol use No  . Drug use: No  . Sexual activity: Not Asked   Other Topics Concern  . None   Social History Narrative  . None     Family History:  The patient's family history includes Diabetes in his mother; Heart disease in his sister, sister, sister, and sister.   ROS:   Please see the history of present illness.    Complains of decreased hearing, weakness, and poor appetite. He has gained weight. He sleeps relatively well.  All other systems reviewed and are negative.   PHYSICAL EXAM:   VS:  BP (!) 142/74 (BP Location: Right Arm)   Pulse 73   Ht 5' 10.5" (1.791 m)   Wt 150 lb (68 kg)   BMI 21.22 kg/m    GEN: Well nourished, well developed, in no acute distress. Overall appears better nourished than on prior visits.  HEENT: normal  Neck: no JVD, carotid bruits, or masses Cardiac: RRR;  rubs, or gallops,no edema date is a soft right upper sternal border systolic murmur.  Respiratory:  clear to auscultation bilaterally, normal work of breathing GI: soft, nontender, nondistended, + BS MS: no deformity or atrophy area Skin: warm and dry, no rash Neuro:  Alert and Oriented x 3, Strength and sensation are intact Psych: euthymic mood, full affect  Wt Readings from Last 3 Encounters:  08/24/16 150 lb (68 kg)  06/16/16 146 lb (66.2 kg)  03/08/16 139 lb 12.8 oz (63.4 kg)      Studies/Labs Reviewed:   EKG:  EKG  Not done. The last tracing was performed in the device clinic in November 2017.  Recent Labs: 11/12/2015: B Natriuretic Peptide 3,358.8 02/10/2016: ALT 25; BUN 37; Creatinine, Ser 2.87; Hemoglobin 10.9; Magnesium 2.0; Platelets 122; Potassium 3.6; Sodium 139; TSH 1.765   Lipid Panel No results found for: CHOL, TRIG, HDL, CHOLHDL, VLDL, LDLCALC, LDLDIRECT  Additional studies/ records that were reviewed today include:  Echocardiogram 11/14/15: Study Conclusions  - Left ventricle: The cavity size was normal. Wall  thickness was   increased in a pattern of mild LVH. Systolic function was mildly   to moderately reduced. The estimated ejection fraction was in the   range of 40% to 45%. - Aortic valve: Severely calcified annulus. There was very mild   stenosis. Valve area (VTI): 1.97 cm^2. Valve area (Vmax): 2.02   cm^2. Valve area (Vmean): 2.08 cm^2. - Mitral valve: Moderately calcified annulus. There was moderate   regurgitation. - Left atrium: The atrium was moderately dilated. - Right atrium: The atrium was mildly dilated. -  Tricuspid valve: There was moderate regurgitation. - Pulmonary arteries: Systolic pressure was moderately increased.   PA peak pressure: 49 mm Hg (S).    ASSESSMENT:    1. Coronary artery disease involving native coronary artery of native heart without angina pectoris   2. Chronic systolic CHF (congestive heart failure) ([place])   3. Essential hypertension   4. Paroxysmal atrial fibrillation (HCC)   5. Dementia with behavioral disturbance, unspecified dementia type   6. Stage 4 chronic kidney disease ([place])   7. Pacemaker   8. Chronic anticoagulation      PLAN:  In order of problems listed above:  1. No apparent symptoms of angina. At his age and under the current circumstances, it is inappropriate to perform ischemia testing. Continue to follow clinically. Medical therapy adjustments as needed 2. Chronic combined systolic and diastolic heart failure with most recent LVEF estimated in the 40-45% range. Evidence of volume overload on exam today. Neck veins are flat and lungs are perfectly clear. Edema has completely resolved. Continue current low dose of loop diuretic therapy. 3. Excellent blood pressure control. Target less than 145/90 mmHg. 4. No recent clinical instances of significant atrial fibrillation. Not chronically anticoagulated because of risk of bleeding related to frailty, falls, history of intracranial bleed, and history of GI bleeding in the  past. 5. Dementia is profound and renders him completely different than his previous baseline greater than 5 years ago. 6. Not addressed 7. Recently evaluated and functioning normally. 8. Chronic anticoagulation is not being used because of prior bleeding episodes and risks of trauma due to frailty.    Medication Adjustments/Labs and Tests Ordered: Current medicines are reviewed at length with the patient today.  Concerns regarding medicines are outlined above.  Medication changes, Labs and Tests ordered today are listed in the Patient Instructions below. Patient Instructions  Medication Instructions:  Your physician recommends that you continue on your current medications as directed. Please refer to the Current Medication list given to you today.   Labwork: None ordered  Testing/Procedures: None ordered  Follow-Up: Your physician wants you to follow-up in 1 year with Dr. Tamala Julian. You will receive a reminder letter in the mail two months in advance. If you don't receive a letter, please call our office to schedule the follow-up appointment.   Any Other Special Instructions Will Be Listed Below (If Applicable).      PLEASE WEIGH DAILY.   If you need a refill on your cardiac medications before your next appointment, please call your pharmacy.      Signed, Sinclair Grooms, MD  08/24/2016 1:17 PM    [facility] [place], [place], [place]  [address] Phone: [phone]; Fax: [phone]

## 2016-08-24 NOTE — Patient Instructions (Signed)
Medication Instructions:  Your physician recommends that you continue on your current medications as directed. Please refer to the Current Medication list given to you today.   Labwork: None ordered  Testing/Procedures: None ordered  Follow-Up: Your physician wants you to follow-up in 1 year with Dr. Tamala Julian. You will receive a reminder letter in the mail two months in advance. If you don't receive a letter, please call our office to schedule the follow-up appointment.   Any Other Special Instructions Will Be Listed Below (If Applicable).      PLEASE WEIGH DAILY.   If you need a refill on your cardiac medications before your next appointment, please call your pharmacy.

## 2016-09-06 ENCOUNTER — Ambulatory Visit: Payer: Self-pay | Admitting: Nurse Practitioner

## 2016-09-15 ENCOUNTER — Emergency Department (HOSPITAL_COMMUNITY): Payer: Medicare Other

## 2016-09-15 ENCOUNTER — Encounter (HOSPITAL_COMMUNITY): Payer: Self-pay

## 2016-09-15 ENCOUNTER — Inpatient Hospital Stay (HOSPITAL_COMMUNITY)
Admission: EM | Admit: 2016-09-15 | Discharge: 2016-09-20 | DRG: 193 | Disposition: A | Discharge status: Home Health | Payer: Medicare Other | Attending: Nephrology | Admitting: Nephrology

## 2016-09-15 DIAGNOSIS — I248 Other forms of acute ischemic heart disease: Secondary | ICD-10-CM | POA: Diagnosis present

## 2016-09-15 DIAGNOSIS — I272 Pulmonary hypertension, unspecified: Secondary | ICD-10-CM | POA: Diagnosis present

## 2016-09-15 DIAGNOSIS — Z86718 Personal history of other venous thrombosis and embolism: Secondary | ICD-10-CM

## 2016-09-15 DIAGNOSIS — R7989 Other specified abnormal findings of blood chemistry: Secondary | ICD-10-CM | POA: Diagnosis present

## 2016-09-15 DIAGNOSIS — R0602 Shortness of breath: Secondary | ICD-10-CM

## 2016-09-15 DIAGNOSIS — I10 Essential (primary) hypertension: Secondary | ICD-10-CM | POA: Diagnosis not present

## 2016-09-15 DIAGNOSIS — Z515 Encounter for palliative care: Secondary | ICD-10-CM | POA: Diagnosis not present

## 2016-09-15 DIAGNOSIS — H9190 Unspecified hearing loss, unspecified ear: Secondary | ICD-10-CM | POA: Diagnosis present

## 2016-09-15 DIAGNOSIS — F32A Depression, unspecified: Secondary | ICD-10-CM | POA: Diagnosis present

## 2016-09-15 DIAGNOSIS — Z8546 Personal history of malignant neoplasm of prostate: Secondary | ICD-10-CM | POA: Diagnosis not present

## 2016-09-15 DIAGNOSIS — Z950 Presence of cardiac pacemaker: Secondary | ICD-10-CM | POA: Diagnosis not present

## 2016-09-15 DIAGNOSIS — J1008 Influenza due to other identified influenza virus with other specified pneumonia: Principal | ICD-10-CM | POA: Diagnosis present

## 2016-09-15 DIAGNOSIS — I480 Paroxysmal atrial fibrillation: Secondary | ICD-10-CM | POA: Diagnosis present

## 2016-09-15 DIAGNOSIS — N184 Chronic kidney disease, stage 4 (severe): Secondary | ICD-10-CM | POA: Diagnosis not present

## 2016-09-15 DIAGNOSIS — J188 Other pneumonia, unspecified organism: Secondary | ICD-10-CM | POA: Diagnosis present

## 2016-09-15 DIAGNOSIS — I5043 Acute on chronic combined systolic (congestive) and diastolic (congestive) heart failure: Secondary | ICD-10-CM | POA: Diagnosis present

## 2016-09-15 DIAGNOSIS — I130 Hypertensive heart and chronic kidney disease with heart failure and stage 1 through stage 4 chronic kidney disease, or unspecified chronic kidney disease: Secondary | ICD-10-CM | POA: Diagnosis present

## 2016-09-15 DIAGNOSIS — Z794 Long term (current) use of insulin: Secondary | ICD-10-CM | POA: Diagnosis not present

## 2016-09-15 DIAGNOSIS — R112 Nausea with vomiting, unspecified: Secondary | ICD-10-CM | POA: Diagnosis present

## 2016-09-15 DIAGNOSIS — F0391 Unspecified dementia with behavioral disturbance: Secondary | ICD-10-CM | POA: Diagnosis not present

## 2016-09-15 DIAGNOSIS — E1121 Type 2 diabetes mellitus with diabetic nephropathy: Secondary | ICD-10-CM | POA: Diagnosis present

## 2016-09-15 DIAGNOSIS — F329 Major depressive disorder, single episode, unspecified: Secondary | ICD-10-CM | POA: Diagnosis present

## 2016-09-15 DIAGNOSIS — I509 Heart failure, unspecified: Secondary | ICD-10-CM | POA: Diagnosis not present

## 2016-09-15 DIAGNOSIS — R778 Other specified abnormalities of plasma proteins: Secondary | ICD-10-CM | POA: Diagnosis present

## 2016-09-15 DIAGNOSIS — Z8249 Family history of ischemic heart disease and other diseases of the circulatory system: Secondary | ICD-10-CM

## 2016-09-15 DIAGNOSIS — J181 Lobar pneumonia, unspecified organism: Secondary | ICD-10-CM | POA: Diagnosis not present

## 2016-09-15 DIAGNOSIS — E876 Hypokalemia: Secondary | ICD-10-CM | POA: Diagnosis present

## 2016-09-15 DIAGNOSIS — R791 Abnormal coagulation profile: Secondary | ICD-10-CM | POA: Diagnosis present

## 2016-09-15 DIAGNOSIS — R748 Abnormal levels of other serum enzymes: Secondary | ICD-10-CM | POA: Diagnosis not present

## 2016-09-15 DIAGNOSIS — E1122 Type 2 diabetes mellitus with diabetic chronic kidney disease: Secondary | ICD-10-CM | POA: Diagnosis present

## 2016-09-15 DIAGNOSIS — Z833 Family history of diabetes mellitus: Secondary | ICD-10-CM

## 2016-09-15 DIAGNOSIS — Z79899 Other long term (current) drug therapy: Secondary | ICD-10-CM

## 2016-09-15 DIAGNOSIS — F3289 Other specified depressive episodes: Secondary | ICD-10-CM

## 2016-09-15 DIAGNOSIS — F0390 Unspecified dementia without behavioral disturbance: Secondary | ICD-10-CM | POA: Diagnosis present

## 2016-09-15 DIAGNOSIS — Z951 Presence of aortocoronary bypass graft: Secondary | ICD-10-CM

## 2016-09-15 DIAGNOSIS — E785 Hyperlipidemia, unspecified: Secondary | ICD-10-CM | POA: Diagnosis present

## 2016-09-15 DIAGNOSIS — I2510 Atherosclerotic heart disease of native coronary artery without angina pectoris: Secondary | ICD-10-CM | POA: Diagnosis present

## 2016-09-15 DIAGNOSIS — I495 Sick sinus syndrome: Secondary | ICD-10-CM | POA: Diagnosis present

## 2016-09-15 DIAGNOSIS — F418 Other specified anxiety disorders: Secondary | ICD-10-CM | POA: Diagnosis present

## 2016-09-15 DIAGNOSIS — J189 Pneumonia, unspecified organism: Secondary | ICD-10-CM | POA: Diagnosis present

## 2016-09-15 DIAGNOSIS — N2889 Other specified disorders of kidney and ureter: Secondary | ICD-10-CM | POA: Diagnosis present

## 2016-09-15 DIAGNOSIS — J1100 Influenza due to unidentified influenza virus with unspecified type of pneumonia: Secondary | ICD-10-CM | POA: Diagnosis not present

## 2016-09-15 LAB — RESPIRATORY PANEL BY PCR
Adenovirus: NOT DETECTED
BORDETELLA PERTUSSIS-RVPCR: NOT DETECTED
CHLAMYDOPHILA PNEUMONIAE-RVPPCR: NOT DETECTED
Coronavirus 229E: NOT DETECTED
Coronavirus HKU1: NOT DETECTED
Coronavirus NL63: NOT DETECTED
Coronavirus OC43: NOT DETECTED
INFLUENZA A-RVPPCR: NOT DETECTED
Influenza B: NOT DETECTED
METAPNEUMOVIRUS-RVPPCR: NOT DETECTED
Mycoplasma pneumoniae: NOT DETECTED
PARAINFLUENZA VIRUS 2-RVPPCR: NOT DETECTED
PARAINFLUENZA VIRUS 3-RVPPCR: NOT DETECTED
PARAINFLUENZA VIRUS 4-RVPPCR: NOT DETECTED
Parainfluenza Virus 1: NOT DETECTED
RHINOVIRUS / ENTEROVIRUS - RVPPCR: NOT DETECTED
Respiratory Syncytial Virus: NOT DETECTED

## 2016-09-15 LAB — URINALYSIS, ROUTINE W REFLEX MICROSCOPIC
BACTERIA UA: NONE SEEN
BILIRUBIN URINE: NEGATIVE
Glucose, UA: 50 mg/dL — AB
Hgb urine dipstick: NEGATIVE
Ketones, ur: NEGATIVE mg/dL
Leukocytes, UA: NEGATIVE
Nitrite: NEGATIVE
PH: 6 (ref 5.0–8.0)
Protein, ur: 30 mg/dL — AB
SPECIFIC GRAVITY, URINE: 1.011 (ref 1.005–1.030)

## 2016-09-15 LAB — I-STAT CG4 LACTIC ACID, ED: LACTIC ACID, VENOUS: 1.63 mmol/L (ref 0.5–1.9)

## 2016-09-15 LAB — MAGNESIUM: MAGNESIUM: 2.1 mg/dL (ref 1.7–2.4)

## 2016-09-15 LAB — HEPATIC FUNCTION PANEL
ALBUMIN: 3.3 g/dL — AB (ref 3.5–5.0)
ALT: 32 U/L (ref 17–63)
AST: 34 U/L (ref 15–41)
Alkaline Phosphatase: 98 U/L (ref 38–126)
Bilirubin, Direct: <0.1 mg/dL — ABNORMAL LOW (ref 0.1–0.5)
TOTAL PROTEIN: 6.2 g/dL — AB (ref 6.5–8.1)
Total Bilirubin: 0.4 mg/dL (ref 0.3–1.2)

## 2016-09-15 LAB — BASIC METABOLIC PANEL
ANION GAP: 12 (ref 5–15)
BUN: 47 mg/dL — AB (ref 6–20)
CHLORIDE: 105 mmol/L (ref 101–111)
CO2: 26 mmol/L (ref 22–32)
Calcium: 9.9 mg/dL (ref 8.9–10.3)
Creatinine, Ser: 2.89 mg/dL — ABNORMAL HIGH (ref 0.61–1.24)
GFR calc Af Amer: 20 mL/min — ABNORMAL LOW (ref >60)
GFR, EST NON AFRICAN AMERICAN: 18 mL/min — AB (ref >60)
GLUCOSE: 211 mg/dL — AB (ref 65–99)
POTASSIUM: 4 mmol/L (ref 3.5–5.1)
Sodium: 143 mmol/L (ref 135–145)

## 2016-09-15 LAB — TROPONIN I
TROPONIN I: 0.21 ng/mL — AB (ref <0.03)
Troponin I: 0.23 ng/mL (ref <0.03)

## 2016-09-15 LAB — CBC
HEMATOCRIT: 37.2 % — AB (ref 39.0–52.0)
HEMOGLOBIN: 12.6 g/dL — AB (ref 13.0–17.0)
MCH: 30.2 pg (ref 26.0–34.0)
MCHC: 33.9 g/dL (ref 30.0–36.0)
MCV: 89.2 fL (ref 78.0–100.0)
Platelets: 212 10*3/uL (ref 150–400)
RBC: 4.17 MIL/uL — ABNORMAL LOW (ref 4.22–5.81)
RDW: 13.3 % (ref 11.5–15.5)
WBC: 4.9 10*3/uL (ref 4.0–10.5)

## 2016-09-15 LAB — BRAIN NATRIURETIC PEPTIDE: B Natriuretic Peptide: 2779.7 pg/mL — ABNORMAL HIGH (ref 0.0–100.0)

## 2016-09-15 LAB — INFLUENZA PANEL BY PCR (TYPE A & B)
INFLAPCR: POSITIVE — AB
INFLBPCR: NEGATIVE

## 2016-09-15 MED ORDER — ENOXAPARIN SODIUM 30 MG/0.3ML ~~LOC~~ SOLN
30.0000 mg | SUBCUTANEOUS | Status: DC
  Filled 2016-09-15: qty 0.3

## 2016-09-15 MED ORDER — DEXTROSE 5 % IV SOLN
1.0000 g | INTRAVENOUS | Status: DC
  Administered 2016-09-17 – 2016-09-19 (×3): 1 g via INTRAVENOUS
  Filled 2016-09-15 (×5): qty 10

## 2016-09-15 MED ORDER — SODIUM CHLORIDE 0.9 % IV SOLN
INTRAVENOUS | Status: DC
  Administered 2016-09-18: 10 mL/h via INTRAVENOUS

## 2016-09-15 MED ORDER — DEXTROSE 5 % IV SOLN: 1.0000 g | INTRAVENOUS | Status: DC

## 2016-09-15 MED ORDER — ONDANSETRON HCL 4 MG PO TABS
4.0000 mg | ORAL_TABLET | Freq: Four times a day (QID) | ORAL | Status: DC | PRN
Start: 2016-09-15 — End: 2016-09-20
  Administered 2016-09-17: 4 mg via ORAL
  Filled 2016-09-15: qty 1

## 2016-09-15 MED ORDER — ISOSORB DINITRATE-HYDRALAZINE 20-37.5 MG PO TABS: 1.0000 | ORAL_TABLET | Freq: Three times a day (TID) | ORAL | Status: DC

## 2016-09-15 MED ORDER — ATENOLOL 50 MG PO TABS
50.0000 mg | ORAL_TABLET | Freq: Two times a day (BID) | ORAL | Status: DC
  Filled 2016-09-15: qty 1

## 2016-09-15 MED ORDER — ONDANSETRON HCL 4 MG/2ML IJ SOLN: 4.0000 mg | Freq: Four times a day (QID) | INTRAMUSCULAR | Status: DC | PRN

## 2016-09-15 MED ORDER — ENOXAPARIN SODIUM 40 MG/0.4ML ~~LOC~~ SOLN
40.0000 mg | SUBCUTANEOUS | Status: DC
  Administered 2016-09-15: 40 mg via SUBCUTANEOUS
  Filled 2016-09-15: qty 0.4

## 2016-09-15 MED ORDER — HYDRALAZINE HCL 20 MG/ML IJ SOLN: 5.0000 mg | Freq: Four times a day (QID) | INTRAMUSCULAR | Status: DC | PRN

## 2016-09-15 MED ORDER — OSELTAMIVIR PHOSPHATE 30 MG PO CAPS
30.0000 mg | ORAL_CAPSULE | Freq: Every day | ORAL | Status: AC
Start: 2016-09-16 — End: 2016-09-19
  Administered 2016-09-16 – 2016-09-19 (×4): 30 mg via ORAL
  Filled 2016-09-15 (×4): qty 1

## 2016-09-15 MED ORDER — DEXTROSE 5 % IV SOLN
1.0000 g | Freq: Once | INTRAVENOUS | Status: AC
  Administered 2016-09-15: 1 g via INTRAVENOUS
  Filled 2016-09-15: qty 10

## 2016-09-15 MED ORDER — SODIUM CHLORIDE 0.9% FLUSH
3.0000 mL | Freq: Two times a day (BID) | INTRAVENOUS | Status: DC
  Administered 2016-09-16 – 2016-09-20 (×8): 3 mL via INTRAVENOUS

## 2016-09-15 MED ORDER — OSELTAMIVIR PHOSPHATE 75 MG PO CAPS
75.0000 mg | ORAL_CAPSULE | Freq: Two times a day (BID) | ORAL | Status: DC
  Administered 2016-09-15: 75 mg via ORAL
  Filled 2016-09-15: qty 1

## 2016-09-15 MED ORDER — ISOSORB DINITRATE-HYDRALAZINE 20-37.5 MG PO TABS
1.0000 | ORAL_TABLET | Freq: Three times a day (TID) | ORAL | Status: DC
  Administered 2016-09-15 – 2016-09-20 (×14): 1 via ORAL
  Filled 2016-09-15 (×14): qty 1

## 2016-09-15 MED ORDER — DEXTROSE 5 % IV SOLN
500.0000 mg | Freq: Once | INTRAVENOUS | Status: AC
  Administered 2016-09-15: 500 mg via INTRAVENOUS
  Filled 2016-09-15: qty 500

## 2016-09-15 MED ORDER — FERROUS SULFATE 325 (65 FE) MG PO TABS
325.0000 mg | ORAL_TABLET | Freq: Every day | ORAL | Status: DC
  Administered 2016-09-16 – 2016-09-20 (×5): 325 mg via ORAL
  Filled 2016-09-15 (×5): qty 1

## 2016-09-15 MED ORDER — ATENOLOL 50 MG PO TABS
50.0000 mg | ORAL_TABLET | Freq: Two times a day (BID) | ORAL | Status: DC
  Administered 2016-09-15 – 2016-09-20 (×10): 50 mg via ORAL
  Filled 2016-09-15 (×11): qty 1

## 2016-09-15 MED ORDER — RAMELTEON 8 MG PO TABS
8.0000 mg | ORAL_TABLET | Freq: Every evening | ORAL | Status: DC | PRN
  Administered 2016-09-17: 8 mg via ORAL
  Filled 2016-09-15 (×4): qty 1

## 2016-09-15 MED ORDER — LINAGLIPTIN 5 MG PO TABS
7.5000 mg | ORAL_TABLET | Freq: Every day | ORAL | Status: DC
  Administered 2016-09-16 – 2016-09-20 (×5): 7.5 mg via ORAL
  Filled 2016-09-15 (×5): qty 2

## 2016-09-15 MED ORDER — SODIUM CHLORIDE 0.9 % IV BOLUS (SEPSIS)
500.0000 mL | Freq: Once | INTRAVENOUS | Status: AC
  Administered 2016-09-15: 500 mL via INTRAVENOUS

## 2016-09-15 MED ORDER — SERTRALINE HCL 100 MG PO TABS
100.0000 mg | ORAL_TABLET | Freq: Every day | ORAL | Status: DC
  Administered 2016-09-15 – 2016-09-19 (×5): 100 mg via ORAL
  Filled 2016-09-15 (×5): qty 1

## 2016-09-15 MED ORDER — BUPROPION HCL 100 MG PO TABS
100.0000 mg | ORAL_TABLET | Freq: Two times a day (BID) | ORAL | Status: DC
  Administered 2016-09-15 – 2016-09-20 (×9): 100 mg via ORAL
  Filled 2016-09-15 (×12): qty 1

## 2016-09-15 MED ORDER — DONEPEZIL HCL 5 MG PO TABS
5.0000 mg | ORAL_TABLET | Freq: Every day | ORAL | Status: DC
  Administered 2016-09-15: 5 mg via ORAL
  Filled 2016-09-15 (×3): qty 1

## 2016-09-15 MED ORDER — DEXTROSE 5 % IV SOLN
500.0000 mg | INTRAVENOUS | Status: DC
  Filled 2016-09-15: qty 500

## 2016-09-15 NOTE — Progress Notes (Signed)
Influenza A positive on PCR Started tamiflu  Leisa Lenz [facility]

## 2016-09-15 NOTE — ED Provider Notes (Signed)
[facility] DEPT Provider Note   CSN: [id number] Arrival date & time: 09/15/16  1305     History   Chief Complaint Chief Complaint  Patient presents with  . Nausea  . 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  HPI  Cough for 3 months For the last 5 days has had episodes of disorientation, no other focal neurologic deficits Nausea and one episode of vojmiting Urinary frequency, 8 times per night, no hematuria, no dysuria Episodes of shortness of breath on and off over last 5 days, No chest pain, no leg swelling, no dyspnea laying down flat  Past Medical History:  Diagnosis Date  . CAD (coronary artery disease)    a. CABG 1991 and 2006 LIMA LAD, seq. SVG D1-Cfx, SVG d2,and seq. SVG AM-PDA   . Chronic systolic CHF (congestive heart failure) ([place])   . CKD (chronic kidney disease)   . Dementia   . Depression   . Diabetes mellitus   . HTN (hypertension)   . Left leg DVT ([place])   . Prostate cancer ([place])   . Tachycardia-bradycardia syndrome [facility])     Patient Active Problem List   Diagnosis Date Noted  . Left lower lobe pneumonia ([place]) 09/15/2016  . Nausea and vomiting 09/15/2016  . Essential hypertension 09/15/2016  . Troponin level elevated 09/15/2016  . Depression 09/15/2016  . Controlled type 2 diabetes mellitus with diabetic nephropathy, without long-term current use of insulin ([place]) 09/15/2016  . CKD stage 4 due to type 2 diabetes mellitus ([place]) 09/15/2016  . Influenza with pneumonia 09/15/2016  . Acute on chronic combined systolic and diastolic heart failure ([place]) 09/15/2016  . ICH (intracerebral hemorrhage) ([place]) 02/09/2016  . Dementia 06/06/2014  . Hyperlipidemia 04/18/2012  . Prostate cancer ([place]) 04/18/2012  . Pacemaker     Past Surgical History:  Procedure Laterality Date  . CORONARY ARTERY BYPASS GRAFT    . PROSTATECTOMY         Home Medications    Prior to Admission medications   Medication Sig Start Date End Date  Taking? Authorizing Provider  atenolol (TENORMIN) 50 MG tablet Take 50 mg by mouth 2 (two) times daily.    Yes Historical Provider, MD  buPROPion (WELLBUTRIN) 100 MG tablet Take 1 tablet by mouth 2 (two) times daily.  12/09/14  Yes Historical Provider, MD  CALCIUM PO Take 1 tablet by mouth 2 (two) times daily.    Yes Historical Provider, MD  Coenzyme Q10 (COQ10 PO) Take 1 capsule by mouth daily.   Yes Historical Provider, MD  Docusate Sodium (DOCQLACE PO) Take 2 capsules by mouth 2 (two) times daily.   Yes Historical Provider, MD  donepezil (ARICEPT) 5 MG tablet Take 5 mg by mouth at bedtime.   Yes Historical Provider, MD  feeding supplement, GLUCERNA SHAKE, (GLUCERNA SHAKE) LIQD Take 237 mLs by mouth 3 (three) times daily as needed (feeding supplement).   Yes Historical Provider, MD  ferrous sulfate 325 (65 FE) MG tablet Take 1 tablet (325 mg total) by mouth daily with breakfast. 11/15/15  Yes Carly J Rivet, MD  furosemide (LASIX) 20 MG tablet Take 1 tablet (20 mg total) by mouth daily. 11/15/15  Yes Juliet Rude, MD  Glucose Blood (FREESTYLE LITE TEST VI) USE AS DIRECTED 06/02/13  Yes Historical Provider, MD  Insulin Detemir (LEVEMIR) 100 UNIT/ML Pen Inject 8 Units into the skin daily as needed. For blood glucose per sliding scale. 11/15/15  Yes Historical Provider,  MD  isosorbide-hydrALAZINE (BIDIL) 20-37.5 MG tablet Take 1 tablet by mouth 3 (three) times daily. 11/15/15  Yes Carly Montey Hora, MD  linagliptin (TRADJENTA) 5 MG TABS tablet Take 7.5 mg by mouth daily.   Yes Historical Provider, MD  Multiple Vitamins-Minerals (ICAPS PO) Take 1 tablet by mouth 2 (two) times daily.   Yes Historical Provider, MD  polyethylene glycol (MIRALAX / GLYCOLAX) packet Take 17 g by mouth daily as needed for moderate constipation.   Yes Historical Provider, MD  Probiotic Product (ALIGN PO) Take 1 capsule by mouth daily.   Yes Historical Provider, MD  ramelteon (ROZEREM) 8 MG tablet Take 8 mg by mouth at bedtime as needed  for sleep.   Yes Historical Provider, MD  SENNA PO Take 1 tablet by mouth daily.   Yes Historical Provider, MD  sertraline (ZOLOFT) 50 MG tablet Take 100 mg by mouth at bedtime.    Yes Historical Provider, MD  ondansetron (ZOFRAN) 4 MG tablet Take 1 tablet (4 mg total) by mouth every 6 (six) hours as needed for nausea or vomiting. Patient not taking: Reported on 09/15/2016 07/06/15   Iline Oven, MD    Family History Family History  Problem Relation Age of Onset  . Diabetes Mother   . Heart disease Sister   . Heart disease Sister   . Heart disease Sister   . Heart disease Sister     Social History Social History  Substance Use Topics  . Smoking status: Never Smoker  . Smokeless tobacco: Never Used  . Alcohol use No     Allergies   Iodinated diagnostic agents; Iohexol; Ace inhibitors; Simvastatin; and Aspirin   Review of Systems Review of Systems  Constitutional: Positive for activity change, appetite change and fatigue. Negative for fever.  HENT: Negative for sore throat.   Eyes: Negative for visual disturbance.  Respiratory: Positive for cough and shortness of breath.   Cardiovascular: Negative for chest pain and leg swelling.  Gastrointestinal: Positive for nausea and vomiting. Negative for abdominal pain, constipation and diarrhea.  Genitourinary: Positive for frequency. Negative for difficulty urinating and dysuria.  Musculoskeletal: Negative for back pain and neck stiffness.  Skin: Negative for rash.  Neurological: Negative for syncope, weakness and numbness.     Physical Exam Updated Vital Signs BP 137/63 (BP Location: Right Arm)   Pulse 60   Temp 98.1 F (36.7 C) (Oral)   Resp 18   Ht 5\' 11"  (1.803 m)   Wt 148 lb 12.8 oz (67.5 kg)   SpO2 99%   BMI 20.75 kg/m   Physical Exam  Constitutional: He appears well-developed and well-nourished. No distress.  HENT:  Head: Normocephalic and atraumatic.  Dry mucous membranes  Eyes: Conjunctivae and EOM  are normal.  Neck: Normal range of motion. No JVD present.  Cardiovascular: Normal rate, regular rhythm and intact distal pulses.  Exam reveals no gallop and no friction rub.   Murmur heard. Pulmonary/Chest: Effort normal and breath sounds normal. No respiratory distress. He has no wheezes. He has no rales.  Abdominal: Soft. He exhibits no distension. There is no tenderness. There is no guarding.  Musculoskeletal: He exhibits no edema.  Neurological: He is alert.  Skin: Skin is warm and dry. He is not diaphoretic.  Nursing note and vitals reviewed.    ED Treatments / Results  Labs (all labs ordered are listed, but only abnormal results are displayed) Labs Reviewed  BASIC METABOLIC PANEL - Abnormal; Notable for the following:  Result Value   Glucose, Bld 211 (*)    BUN 47 (*)    Creatinine, Ser 2.89 (*)    GFR calc non Af Amer 18 (*)    GFR calc Af Amer 20 (*)    All other components within normal limits  CBC - Abnormal; Notable for the following:    RBC 4.17 (*)    Hemoglobin 12.6 (*)    HCT 37.2 (*)    All other components within normal limits  URINALYSIS, ROUTINE W REFLEX MICROSCOPIC - Abnormal; Notable for the following:    APPearance HAZY (*)    Glucose, UA 50 (*)    Protein, ur 30 (*)    Squamous Epithelial / LPF 0-5 (*)    All other components within normal limits  HEPATIC FUNCTION PANEL - Abnormal; Notable for the following:    Total Protein 6.2 (*)    Albumin 3.3 (*)    Bilirubin, Direct <0.1 (*)    All other components within normal limits  TROPONIN I - Abnormal; Notable for the following:    Troponin I 0.21 (*)    All other components within normal limits  BRAIN NATRIURETIC PEPTIDE - Abnormal; Notable for the following:    B Natriuretic Peptide 2,779.7 (*)    All other components within normal limits  INFLUENZA PANEL BY PCR (TYPE A & B) - Abnormal; Notable for the following:    Influenza A By PCR POSITIVE (*)    All other components within normal limits   TROPONIN I - Abnormal; Notable for the following:    Troponin I 0.23 (*)    All other components within normal limits  RESPIRATORY PANEL BY PCR  CULTURE, BLOOD (ROUTINE X 2)  CULTURE, BLOOD (ROUTINE X 2)  URINE CULTURE  GRAM STAIN  URINE CULTURE  CULTURE, EXPECTORATED SPUTUM-ASSESSMENT  MAGNESIUM  TROPONIN I  TROPONIN I  STREP PNEUMONIAE URINARY ANTIGEN  HEMOGLOBIN 123XX123  BASIC METABOLIC PANEL  CBG MONITORING, ED  I-STAT CG4 LACTIC ACID, ED  I-STAT CG4 LACTIC ACID, ED    EKG  EKG Interpretation  Date/Time:  Wednesday September 15 2016 13:39:02 EST Ventricular Rate:  64 PR Interval:    QRS Duration: 186 QT Interval:  490 QTC Calculation: 505 R Axis:   -73 Text Interpretation:  AV dual-paced rhythm Abnormal ECG QRS axis changed in V2 since prior, otherwise no change Confirmed by [facility] MD, Jaxden Blyden ([id number]) on 09/15/2016 4:05:45 PM       Radiology Dg Chest 2 View  Result Date: 09/15/2016 CLINICAL DATA:  Shortness of Breath EXAM: CHEST  2 VIEW COMPARISON:  November 12, 2015 FINDINGS: There is airspace consolidation in the left base. There are small pleural effusions bilaterally. Lungs elsewhere are clear. Heart is borderline enlarged with pulmonary vascularity within normal limits. Patient is status post coronary artery bypass grafting. Pacemaker leads are attached the right atrium and right ventricle. No adenopathy. There is degenerative change in the thoracic spine. There is aortic atherosclerosis. IMPRESSION: Airspace consolidation left base, likely pneumonia. Small pleural effusions bilaterally. Heart is borderline enlarged, stable. Pulmonary vascular within normal limits. There is aortic atherosclerosis. Pacemaker leads attached to right atrium and right ventricle. Electronically Signed   By: Lowella Grip III M.D.   On: 09/15/2016 14:07    Procedures Angiocath insertion Date/Time: 09/16/2016 1:21 AM Performed by: Gareth Morgan Authorized by: Gareth Morgan    Consent: Verbal consent obtained. Consent given by: spouse and patient Required items: required blood products, implants, devices, and special equipment  available Local anesthesia used: no  Anesthesia: Local anesthesia used: no  Sedation: Patient sedated: no Patient tolerance: Patient tolerated the procedure well with no immediate complications Comments: US guided IV placed in left AC    (including critical care time)  Medications Ordered in ED Medications  donepezil (ARICEPT) tablet 5 mg (5 mg Oral Given 09/15/16 2129)  linagliptin (TRADJENTA) tablet 7.5 mg (not administered)  sertraline (ZOLOFT) tablet 100 mg (100 mg Oral Given 09/15/16 2129)  buPROPion (WELLBUTRIN) tablet 100 mg (100 mg Oral Given 09/15/16 2300)  ramelteon (ROZEREM) tablet 8 mg (not administered)  ferrous sulfate tablet 325 mg (not administered)  sodium chloride flush (NS) 0.9 % injection 3 mL (not administered)  0.9 %  sodium chloride infusion (not administered)  ondansetron (ZOFRAN) tablet 4 mg (not administered)    Or  ondansetron (ZOFRAN) injection 4 mg (not administered)  azithromycin (ZITHROMAX) 500 mg in dextrose 5 % 250 mL IVPB (not administered)  cefTRIAXone (ROCEPHIN) 1 g in dextrose 5 % 50 mL IVPB (not administered)  isosorbide-hydrALAZINE (BIDIL) 20-37.5 MG per tablet 1 tablet (1 tablet Oral Given 09/15/16 2030)  atenolol (TENORMIN) tablet 50 mg (50 mg Oral Given 09/15/16 2100)  hydrALAZINE (APRESOLINE) injection 5 mg (not administered)  enoxaparin (LOVENOX) injection 30 mg (not administered)  oseltamivir (TAMIFLU) capsule 30 mg (not administered)  sodium chloride 0.9 % bolus 500 mL (500 mLs Intravenous New Bag/Given 09/15/16 1854)  cefTRIAXone (ROCEPHIN) 1 g in dextrose 5 % 50 mL IVPB (0 g Intravenous Stopped 09/15/16 1925)  azithromycin (ZITHROMAX) 500 mg in dextrose 5 % 250 mL IVPB (500 mg Intravenous New Bag/Given 09/15/16 1939)     Initial Impression / Assessment and Plan / ED Course  I have  reviewed the triage vital signs and the nursing notes.  Pertinent labs & imaging results that were available during my care of the patient were reviewed by me and considered in my medical decision making (see chart for details).    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  Patient without focal neurologic symptoms. Chest x-ray shows consolidation of the left base. Concern for community-acquired pneumonia. Patient is not septic, with normal lactic acid.  Given Rocephin, azithromycin for kidney acquired pneumonia. Initially ordered 500 mL of normal saline as patient appeared to be dry on exam, with decreased appetite and no signs of congestive heart failure.  However, lab work returned showing an elevated BNP to this 2000, and elevated troponin which is likely secondary to congestive heart failure. Stopped the 500 mL bolus of normal saline and notified hospitalist.  Admitted for further care.  Final Clinical Impressions(s) / ED Diagnoses   Final diagnoses:  Community acquired pneumonia of left lower lobe of lung ([place])  Congestive heart failure, unspecified congestive heart failure chronicity, unspecified congestive heart failure type [facility])    New Prescriptions Current Discharge Medication List       Gareth Morgan, MD 09/16/16 [phone]

## 2016-09-15 NOTE — H&P (Signed)
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 Gareth Morgan   PCP: [name], MD   Outpatient Specialists: Cardiology, Dr. 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 He presented to [facility] with nausea, vomiting and weakness over past day or so prior to this admission. No associated abdominal pain. Per family at the bedside, someone in family had a flu and they were worried he was exposed to them. No fever, no chills. No diarrhea or constipation. No chest pain, palpitations, shortness of breath.    ED Course: BP was 141/77, HR 60-101, RR 16-18, T max 97.54F and oxygen saturation 96% on room air. Blood work was notable for trop level 0.21, BNP in 2700 range. Cr was 2.89 (baseline value compared with Cr about 7 months ago). CXR showed left base opacity. He was started on empiric azithro and rocephin wile awaiting resp virus panel, blood and sputum cx results.   Review of Systems:  Constitutional: Negative for fever, chills, diaphoresis, activity change, appetite change and fatigue.  HENT: Negative for ear pain, nosebleeds, congestion, facial swelling, rhinorrhea, neck pain, neck stiffness and ear discharge.   Eyes: Negative for pain, discharge, redness, itching and visual disturbance.  Respiratory: Negative for cough, choking, chest tightness, shortness of breath, wheezing and stridor.   Cardiovascular: Negative for chest pain, palpitations and leg swelling.  Gastrointestinal: Negative for abdominal distention.  Genitourinary: Negative for dysuria, urgency,  frequency, hematuria, flank pain, decreased urine volume, difficulty urinating and dyspareunia.  Musculoskeletal: Negative for back pain, joint swelling, arthralgias and gait problem.  Neurological: Negative for dizziness, tremors, seizures, syncope, facial asymmetry, speech difficulty, light-headedness, numbness and headaches.  Hematological: Negative for adenopathy. Does not bruise/bleed easily.  Psychiatric/Behavioral: Negative for hallucinations, behavioral problems, confusion, dysphoric mood, decreased concentration and agitation.   Past Medical History:  Diagnosis Date  . CAD (coronary artery disease)    a. CABG 1991 and 2006 [place], seq. SVG D1-Cfx, SVG d2,and seq. SVG AM-PDA   . Chronic systolic CHF (congestive heart failure) ([place])   . CKD (chronic kidney disease)   . Dementia   . Depression   . Diabetes mellitus   . HTN (hypertension)   . Left leg DVT ([place])   . Prostate cancer ([place])   . Tachycardia-bradycardia syndrome [facility])     Past Surgical History:  Procedure Laterality Date  . CORONARY ARTERY BYPASS GRAFT    . PROSTATECTOMY      Social history:  reports that he has never smoked. He has never used smokeless tobacco. He reports that he does not drink alcohol or use drugs.  Ambulation: ambulates without assistance at baseline   Allergies  Allergen Reactions  . Iodinated Diagnostic Agents Anaphylaxis  . Iohexol Anaphylaxis     Desc: RN states pt stated he had anyphalactic shock reaction to contrast approx 10 years ago.   . Ace Inhibitors Other (See Comments)    cough  . Simvastatin Other (See Comments)    Muscle aches  . Aspirin     Other reaction(s): Abdominal Pain    Family History  Problem  Relation Age of Onset  . Diabetes Mother   . Heart disease Sister   . Heart disease Sister   . Heart disease Sister   . Heart disease Sister     Prior to Admission medications   Medication Sig Start Date End Date Taking? Authorizing Provider  atenolol (TENORMIN)  50 MG tablet Take 50 mg by mouth 2 (two) times daily.    Yes Historical Provider, MD  buPROPion (WELLBUTRIN) 100 MG tablet Take 1 tablet by mouth 2 (two) times daily.  12/09/14  Yes Historical Provider, MD  CALCIUM PO Take 1 tablet by mouth 2 (two) times daily.    Yes Historical Provider, MD  Coenzyme Q10 (COQ10 PO) Take 1 capsule by mouth daily.   Yes Historical Provider, MD  Docusate Sodium (DOCQLACE PO) Take 2 capsules by mouth 2 (two) times daily.   Yes Historical Provider, MD  donepezil (ARICEPT) 5 MG tablet Take 5 mg by mouth at bedtime.   Yes Historical Provider, MD  feeding supplement, GLUCERNA SHAKE, (GLUCERNA SHAKE) LIQD Take 237 mLs by mouth 3 (three) times daily as needed (feeding supplement).   Yes Historical Provider, MD  ferrous sulfate 325 (65 FE) MG tablet Take 1 tablet (325 mg total) by mouth daily with breakfast. 11/15/15  Yes Carly J Rivet, MD  furosemide (LASIX) 20 MG tablet Take 1 tablet (20 mg total) by mouth daily. 11/15/15  Yes Juliet Rude, MD  Glucose Blood (FREESTYLE LITE TEST VI) USE AS DIRECTED 06/02/13  Yes Historical Provider, MD  Insulin Detemir (LEVEMIR) 100 UNIT/ML Pen Inject 8 Units into the skin daily as needed. For blood glucose per sliding scale. 11/15/15  Yes Historical Provider, MD  isosorbide-hydrALAZINE (BIDIL) 20-37.5 MG tablet Take 1 tablet by mouth 3 (three) times daily. 11/15/15  Yes Carly Montey Hora, MD  linagliptin (TRADJENTA) 5 MG TABS tablet Take 7.5 mg by mouth daily.   Yes Historical Provider, MD  Multiple Vitamins-Minerals (ICAPS PO) Take 1 tablet by mouth 2 (two) times daily.   Yes Historical Provider, MD  polyethylene glycol (MIRALAX / GLYCOLAX) packet Take 17 g by mouth daily as needed for moderate constipation.   Yes Historical Provider, MD  Probiotic Product (ALIGN PO) Take 1 capsule by mouth daily.   Yes Historical Provider, MD  ramelteon (ROZEREM) 8 MG tablet Take 8 mg by mouth at bedtime as needed for sleep.   Yes Historical Provider, MD  SENNA PO  Take 1 tablet by mouth daily.   Yes Historical Provider, MD  sertraline (ZOLOFT) 50 MG tablet Take 100 mg by mouth at bedtime.    Yes Historical Provider, MD  ondansetron (ZOFRAN) 4 MG tablet Take 1 tablet (4 mg total) by mouth every 6 (six) hours as needed for nausea or vomiting. Patient not taking: Reported on 09/15/2016 07/06/15   Iline Oven, MD    Physical Exam: Vitals:   09/15/16 1533 09/15/16 1630 09/15/16 1700 09/15/16 1900  BP:  143/85 161/81 160/78  Pulse: 84 72 68 60  Resp:   16   Temp:      TempSrc:      SpO2: 100% 96% 100% 100%    Constitutional: NAD, calm, comfortable Vitals:   09/15/16 1533 09/15/16 1630 09/15/16 1700 09/15/16 1900  BP:  143/85 161/81 160/78  Pulse: 84 72 68 60  Resp:   16   Temp:      TempSrc:      SpO2: 100% 96% 100% 100%   Eyes: PERRL, lids and  conjunctivae normal ENMT: Mucous membranes are moist. Posterior pharynx clear of any exudate or lesions.Normal dentition.  Neck: normal, supple, no masses, no thyromegaly Respiratory: clear to auscultation bilaterally, no wheezing, no crackles. Normal respiratory effort. No accessory muscle use.  Cardiovascular: Regular rate and rhythm, no murmurs / rubs / gallops. No extremity edema. 2+ pedal pulses. Abdomen: no tenderness, no masses palpated. No hepatosplenomegaly. Bowel sounds positive.  Musculoskeletal: no clubbing / cyanosis. No joint deformity upper and lower extremities. Good ROM, no contractures. Normal muscle tone.  Skin: no rashes, lesions, ulcers. No induration Neurologic: CN 2-12 grossly intact. Sensation intact, DTR normal. Strength 5/5 in all 4.  Psychiatric: Normal judgment and insight. Alert and oriented x 3. Normal mood.   Labs on Admission: I have personally reviewed following labs and imaging studies  CBC:  Recent Labs Lab 09/15/16 1320  WBC 4.9  HGB 12.6*  HCT 37.2*  MCV 89.2  PLT 99991111   Basic Metabolic Panel:  Recent Labs Lab 09/15/16 1320 09/15/16 1645  NA 143   --   K 4.0  --   CL 105  --   CO2 26  --   GLUCOSE 211*  --   BUN 47*  --   CREATININE 2.89*  --   CALCIUM 9.9  --   MG  --  2.1   GFR: CrCl cannot be calculated (Unknown ideal weight.). Liver Function Tests:  Recent Labs Lab 09/15/16 1645  AST 34  ALT 32  ALKPHOS 98  BILITOT 0.4  PROT 6.2*  ALBUMIN 3.3*   No results for input(s): LIPASE, AMYLASE in the last 168 hours. No results for input(s): AMMONIA in the last 168 hours. Coagulation Profile: No results for input(s): INR, PROTIME in the last 168 hours. Cardiac Enzymes:  Recent Labs Lab 09/15/16 1645  TROPONINI 0.21*   BNP (last 3 results) No results for input(s): PROBNP in the last 8760 hours. HbA1C: No results for input(s): HGBA1C in the last 72 hours. CBG: No results for input(s): GLUCAP in the last 168 hours. Lipid Profile: No results for input(s): CHOL, HDL, LDLCALC, TRIG, CHOLHDL, LDLDIRECT in the last 72 hours. Thyroid Function Tests: No results for input(s): TSH, T4TOTAL, FREET4, T3FREE, THYROIDAB in the last 72 hours. Anemia Panel: No results for input(s): VITAMINB12, FOLATE, FERRITIN, TIBC, IRON, RETICCTPCT in the last 72 hours. Urine analysis:    Component Value Date/Time   COLORURINE YELLOW 09/15/2016 1930   APPEARANCEUR HAZY (A) 09/15/2016 1930   LABSPEC 1.011 09/15/2016 1930   PHURINE 6.0 09/15/2016 1930   GLUCOSEU 50 (A) 09/15/2016 1930   HGBUR NEGATIVE 09/15/2016 1930   BILIRUBINUR NEGATIVE 09/15/2016 1930   KETONESUR NEGATIVE 09/15/2016 1930   PROTEINUR 30 (A) 09/15/2016 1930   UROBILINOGEN 1.0 09/23/2009 0124   NITRITE NEGATIVE 09/15/2016 1930   LEUKOCYTESUR NEGATIVE 09/15/2016 1930   Sepsis Labs: @LABRCNTIP (procalcitonin:4,lacticidven:4) )No results found for this or any previous visit (from the past 240 hour(s)).   Radiological Exams on Admission: Dg Chest 2 View Result Date: 09/15/2016 Airspace consolidation left base, likely pneumonia. Small pleural effusions bilaterally.  Heart is borderline enlarged, stable. Pulmonary vascular within normal limits. There is aortic atherosclerosis. Pacemaker leads attached to right atrium and right ventricle.   EKG: Independently reviewed. AV dual pace rhythm   Assessment/Plan  Principal Problem:   Left lower lobe pneumonia ([place]) - CXR on admission with left base opacity - Started azithromycin and Rocephin - Follow up resp virus panel - Follow up strep pneumonia, legionella and sputum  cx  Active Problems:   Nausea and vomiting - Unclear etiology - Has had flu exposure in family - Resp virus panel pending    Acute on chronic systolic and diastolic CHF - ECHO in [id number] showed EF 40-45%  - BNP on this admission 2779 - Will hold off on lasix due to CKD - Cardio will be consulted in am; Informed through [facility] message Dr. Tamala Julian that pt is hospitalized  - Daily weight and strict intake and output     Pacemaker - Stable    Dementia - Resume donepezil    Essential hypertension - Continue Bidil and atenolol     Troponin level elevated - Rule out ACS - Likely demand ischemia from acute infection and CKD - Cycle cardiac enzymes - Obtain 2 D ECHO - Please consult cardio in am; I sent message in EPIC to pt cardiologist Dr. Daneen Schick to let him know pt is hospitalized     Depression - Resume sertraline    Controlled type 2 diabetes mellitus with diabetic nephropathy, without long-term current use of insulin (HCC) - Resume linagliptin     CKD stage 4 due to type 2 diabetes mellitus (HCC) - Baseline Cr about 7 months ago was 2.87  - Cr on this admission is within baseline range    DVT prophylaxis: Lovenox subQ Code Status: full code  Family Communication: family at the bedside  Disposition Plan: admission to telemetry unit  Consults called: none  Admission status: inpatient,    Leisa Lenz MD [facility] Pager [phone]  If 7PM-7AM, please contact night-coverage [email or web address] Password  [facility]  09/15/2016, 7:54 PM

## 2016-09-15 NOTE — ED Triage Notes (Signed)
Pt here with family with complaint of nausea, vomiting, generalized weakness and urinary frequency. Family reports some disorientation. Family reports patient has also had flu exposure.

## 2016-09-15 NOTE — ED Notes (Signed)
Pt using urinal at this time.

## 2016-09-16 ENCOUNTER — Inpatient Hospital Stay (HOSPITAL_COMMUNITY): Payer: Medicare Other

## 2016-09-16 DIAGNOSIS — N184 Chronic kidney disease, stage 4 (severe): Secondary | ICD-10-CM

## 2016-09-16 DIAGNOSIS — I10 Essential (primary) hypertension: Secondary | ICD-10-CM

## 2016-09-16 DIAGNOSIS — I509 Heart failure, unspecified: Secondary | ICD-10-CM

## 2016-09-16 DIAGNOSIS — E1122 Type 2 diabetes mellitus with diabetic chronic kidney disease: Secondary | ICD-10-CM

## 2016-09-16 DIAGNOSIS — F0391 Unspecified dementia with behavioral disturbance: Secondary | ICD-10-CM

## 2016-09-16 DIAGNOSIS — J181 Lobar pneumonia, unspecified organism: Secondary | ICD-10-CM

## 2016-09-16 DIAGNOSIS — R748 Abnormal levels of other serum enzymes: Secondary | ICD-10-CM

## 2016-09-16 DIAGNOSIS — I5043 Acute on chronic combined systolic (congestive) and diastolic (congestive) heart failure: Secondary | ICD-10-CM

## 2016-09-16 DIAGNOSIS — E1121 Type 2 diabetes mellitus with diabetic nephropathy: Secondary | ICD-10-CM

## 2016-09-16 LAB — BASIC METABOLIC PANEL
ANION GAP: 9 (ref 5–15)
BUN: 44 mg/dL — AB (ref 6–20)
CHLORIDE: 106 mmol/L (ref 101–111)
CO2: 25 mmol/L (ref 22–32)
Calcium: 8.9 mg/dL (ref 8.9–10.3)
Creatinine, Ser: 2.68 mg/dL — ABNORMAL HIGH (ref 0.61–1.24)
GFR calc Af Amer: 22 mL/min — ABNORMAL LOW (ref >60)
GFR, EST NON AFRICAN AMERICAN: 19 mL/min — AB (ref >60)
Glucose, Bld: 141 mg/dL — ABNORMAL HIGH (ref 65–99)
Potassium: 3.4 mmol/L — ABNORMAL LOW (ref 3.5–5.1)
SODIUM: 140 mmol/L (ref 135–145)

## 2016-09-16 LAB — ECHOCARDIOGRAM COMPLETE
AV Area VTI index: 0.71 cm2/m2
AV Area mean vel: 1.38 cm2
AV Mean grad: 4 mmHg
AV Peak grad: 10 mmHg
AV area mean vel ind: 0.74 cm2/m2
AV vel: 1.32
AVA: 1.32 cm2
AVAREAVTI: 1.39 cm2
AVCELMEANRAT: 0.36
AVPKVEL: 161 cm/s
Ao pk vel: 0.37 m/s
CHL CUP AV PEAK INDEX: 0.75
CHL CUP AV VALUE AREA INDEX: 0.71
DOP CAL AO MEAN VELOCITY: 92.4 cm/s
EERAT: 16.33
EWDT: 187 ms
FS: 12 % — AB (ref 28–44)
Height: 71 in
IV/PV OW: 0.96
LA ID, A-P, ES: 43 mm
LA diam end sys: 43 mm
LA diam index: 2.32 cm/m2
LA vol A4C: 71.3 ml
LA vol index: 44.5 mL/m2
LA vol: 82.4 mL
LDCA: 3.8 cm2
LV E/e' medial: 16.33
LV PW d: 11.3 mm — AB (ref 0.6–1.1)
LV dias vol index: 49 mL/m2
LV sys vol index: 35 mL/m2
LV sys vol: 64 mL — AB (ref 21–61)
LVDIAVOL: 90 mL (ref 62–150)
LVEEAVG: 16.33
LVOT SV: 50 mL
LVOT VTI: 13.1 cm
LVOT peak VTI: 0.35 cm
LVOTD: 22 mm
LVOTPV: 59.1 cm/s
MV Dec: 187
MV pk E vel: 68.9 m/s
MVPKAVEL: 37.3 m/s
Reg peak vel: 331 cm/s
Simpson's disk: 29
Stroke v: 26 ml
TAPSE: 13.7 mm
TDI e' medial: 4.22
TRMAXVEL: 331 cm/s
VTI: 37.6 cm
WEIGHTICAEL: 2350.4 [oz_av]

## 2016-09-16 LAB — STREP PNEUMONIAE URINARY ANTIGEN: STREP PNEUMO URINARY ANTIGEN: NEGATIVE

## 2016-09-16 LAB — TROPONIN I
Troponin I: 0.24 ng/mL (ref <0.03)
Troponin I: 0.34 ng/mL (ref <0.03)

## 2016-09-16 LAB — D-DIMER, QUANTITATIVE (NOT AT ARMC): D DIMER QUANT: 1.56 ug{FEU}/mL — AB (ref 0.00–0.50)

## 2016-09-16 MED ORDER — IPRATROPIUM-ALBUTEROL 0.5-2.5 (3) MG/3ML IN SOLN: 3.0000 mL | Freq: Four times a day (QID) | RESPIRATORY_TRACT | Status: DC | PRN

## 2016-09-16 MED ORDER — TECHNETIUM TC 99M DIETHYLENETRIAME-PENTAACETIC ACID: 30.0000 | Freq: Once | INTRAVENOUS | Status: DC | PRN

## 2016-09-16 MED ORDER — FUROSEMIDE 20 MG PO TABS
20.0000 mg | ORAL_TABLET | Freq: Every day | ORAL | Status: DC
  Administered 2016-09-17 – 2016-09-20 (×4): 20 mg via ORAL
  Filled 2016-09-16 (×4): qty 1

## 2016-09-16 MED ORDER — FUROSEMIDE 10 MG/ML IJ SOLN
40.0000 mg | Freq: Once | INTRAMUSCULAR | Status: AC
  Administered 2016-09-16: 40 mg via INTRAVENOUS
  Filled 2016-09-16: qty 4

## 2016-09-16 MED ORDER — TECHNETIUM TO 99M ALBUMIN AGGREGATED
4.0000 | Freq: Once | INTRAVENOUS | Status: AC | PRN
  Administered 2016-09-16: 4 via INTRAVENOUS

## 2016-09-16 NOTE — Progress Notes (Signed)
PROGRESS NOTE    Paul Rounds, MD  LF:[id number] DOB: [birth year] DOA: 09/15/2016 PCP: [name], MD   Brief Narrative: 83 y.o. male with medical history significant for CAD status post CABG in 1191 and redo in 2006, chronic combined CHF, tachy-brady syndrome and pacemaker placement, history of paroxysmal a fib but not on anticoagulation due to prior bleeding episodes, dementia. He presented to [facility] with nausea, vomiting and weakness. In the ER patient was found to have trop level 0.21, BNP in 2700 range. CXR showed left base opacity. Assessment & Plan:  # Influenza A positive: -Continue droplet precaution -On Tamiflu 30 mg daily since 2/15 -Continue supportive care  #Left lower lobe pneumonia: Chest x-ray consistent with left base opacity. Continue azithromycin and ceftriaxone. Patient is not hypoxic. -I will add DuoNeb nebulizer as needed -Follow up culture results. -Patient has elevated d-dimer with history of Afib. I will check a VQ scan to rule out PE .  # Nausea vomiting likely in the setting of influenza. Continue supportive care. Tolerating diet.  #Possible acute on chronic combined systolic and diastolic congestive heart failure: -Patient with elevated troponin level and BNP. Likely demand ischemia as per cardiologist. Echocardiogram reviewed which was consistent with reduced EF of 30-35% with wall motion abnormalities. Patient also with a grade 2 diastolic dysfunction. Follow-up cardiologist for further recommendation. Patient received a dose of IV Lasix and he started on oral Lasix by cardiologist. -Continue supportive care. -Patient does not have chest pain  #History of paroxysmal atrial fibrillation: Continue atenolol for rate control. Not on anticoagulation because of history of prior bleeding episode as per prior chart.  #Chronic kidney disease stage IV: Serum creatinine level around baseline. Continue to monitor BMP.  #Type 2 diabetes: Currently on  Linagliptin.  #Essential hypertension: Continue BiDil, atenolol, Lasix. Monitor blood pressure closely.  #Anxiety depression: On Zoloft.  #Possible dementia without behavioral disturbance: Currently on Aricept. Continue supportive care.  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 Order PT, OT eval.  Consultants:   Cardiologist  Procedures: Echocardiogram Antimicrobials: Azithromycin, ceftriaxone and Tamiflu since 2/15  Subjective: Patient was seen and examined at bedside. Patient reported feeling better. Denied headache, dizziness, chest pain, shortness of breath, nausea or vomiting. Review of systems Limited because of his underlying dementia. Patient's daughter and caretaker at bedside.  Objective: Vitals:   09/16/16 0053 09/16/16 0500 09/16/16 0828 09/16/16 1508  BP: 137/63 (!) 155/74 (!) 142/69 (!) 153/78  Pulse: 60 63 70 73  Resp: 18 18 18 18   Temp: 98.1 F (36.7 C) 98 F (36.7 C) 98 F (36.7 C) 97.8 F (36.6 C)  TempSrc: Oral Oral Oral Oral  SpO2: 99% 100% 97% 100%  Weight:  66.6 kg (146 lb 14.4 oz)    Height:        Intake/Output Summary (Last 24 hours) at 09/16/16 1537 Last data filed at  09/16/16 1509  Gross per 24 hour  Intake              410 ml  Output              750 ml  Net             -340 ml   Filed Weights   09/15/16 2026  09/16/16 0500  Weight: 67.5 kg (148 lb 12.8 oz) 66.6 kg (146 lb 14.4 oz)    Examination:  General exam: Elderly male lying on bed comfortable, Appears calm and comfortable  Respiratory system: Clear to auscultation. Respiratory effort normal.  Cardiovascular system: S1 & S2 heard, RRR.  No pedal edema. Gastrointestinal system: Abdomen is nondistended, soft and nontender. Normal bowel sounds heard. Central nervous system: Alert awake and following commands  Extremities: Symmetric 5 x 5 power. Skin: No rashes, lesions or ulcers     Data Reviewed: I have personally reviewed following labs and imaging studies  CBC:  Recent Labs Lab 09/15/16 1320  WBC 4.9  HGB 12.6*  HCT 37.2*  MCV 89.2  PLT 99991111   Basic Metabolic Panel:  Recent Labs Lab 09/15/16 1320 09/15/16 1645 09/16/16 0702  NA 143  --  140  K 4.0  --  3.4*  CL 105  --  106  CO2 26  --  25  GLUCOSE 211*  --  141*  BUN 47*  --  44*  CREATININE 2.89*  --  2.68*  CALCIUM 9.9  --  8.9  MG  --  2.1  --    GFR: Estimated Creatinine Clearance: 16.9 mL/min (by C-G formula based on SCr of 2.68 mg/dL (H)). Liver Function Tests:  Recent Labs Lab 09/15/16 1645  AST 34  ALT 32  ALKPHOS 98  BILITOT 0.4  PROT 6.2*  ALBUMIN 3.3*   No results for input(s): LIPASE, AMYLASE in the last 168 hours. No results for input(s): AMMONIA in the last 168 hours. Coagulation Profile: No results for input(s): INR, PROTIME in the last 168 hours. Cardiac Enzymes:  Recent Labs Lab 09/15/16 1645 09/15/16 2043 09/16/16 0046 09/16/16 0702  TROPONINI 0.21* 0.23* 0.24* 0.34*   BNP (last 3 results) No results for input(s): PROBNP in the last 8760 hours. HbA1C: No results for input(s): HGBA1C in the last 72 hours. CBG: No results for input(s): GLUCAP in the last 168 hours. Lipid Profile: No results for input(s): CHOL, HDL, LDLCALC, TRIG, CHOLHDL, LDLDIRECT in the last 72 hours. Thyroid Function Tests: No results for input(s):  TSH, T4TOTAL, FREET4, T3FREE, THYROIDAB in the last 72 hours. Anemia Panel: No results for input(s): VITAMINB12, FOLATE, FERRITIN, TIBC, IRON, RETICCTPCT in the last 72 hours. Sepsis Labs:  Recent Labs Lab 09/15/16 1730  LATICACIDVEN 1.63    Recent Results (from the past 240 hour(s))  Respiratory Panel by PCR     Status: None   Collection Time: 09/15/16  6:22 PM  Result Value Ref Range Status   Adenovirus NOT DETECTED NOT DETECTED Final   Coronavirus 229E NOT DETECTED NOT DETECTED Final   Coronavirus HKU1 NOT DETECTED NOT DETECTED Final   Coronavirus NL63 NOT DETECTED NOT DETECTED Final   Coronavirus OC43 NOT DETECTED NOT DETECTED Final   Metapneumovirus NOT DETECTED NOT DETECTED Final   Rhinovirus / Enterovirus NOT DETECTED NOT DETECTED Final   Influenza A NOT DETECTED NOT DETECTED Final   Influenza B NOT DETECTED NOT DETECTED Final   Parainfluenza Virus 1 NOT DETECTED NOT DETECTED Final  Parainfluenza Virus 2 NOT DETECTED NOT DETECTED Final   Parainfluenza Virus 3 NOT DETECTED NOT DETECTED Final   Parainfluenza Virus 4 NOT DETECTED NOT DETECTED Final   Respiratory Syncytial Virus NOT DETECTED NOT DETECTED Final   Bordetella pertussis NOT DETECTED NOT DETECTED Final   Chlamydophila pneumoniae NOT DETECTED NOT DETECTED Final   Mycoplasma pneumoniae NOT DETECTED NOT DETECTED Final         Radiology Studies: Dg Chest 2 View  Result Date: 09/15/2016 CLINICAL DATA:  Shortness of Breath EXAM: CHEST  2 VIEW COMPARISON:  November 12, 2015 FINDINGS: There is airspace consolidation in the left base. There are small pleural effusions bilaterally. Lungs elsewhere are clear. Heart is borderline enlarged with pulmonary vascularity within normal limits. Patient is status post coronary artery bypass grafting. Pacemaker leads are attached the right atrium and right ventricle. No adenopathy. There is degenerative change in the thoracic spine. There is aortic atherosclerosis. IMPRESSION:  Airspace consolidation left base, likely pneumonia. Small pleural effusions bilaterally. Heart is borderline enlarged, stable. Pulmonary vascular within normal limits. There is aortic atherosclerosis. Pacemaker leads attached to right atrium and right ventricle. Electronically Signed   By: Lowella Grip III M.D.   On: 09/15/2016 14:07        Scheduled Meds: . atenolol  50 mg Oral BID  . azithromycin  500 mg Intravenous Q24H  . buPROPion  100 mg Oral BID  . cefTRIAXone (ROCEPHIN)  IV  1 g Intravenous Q24H  . donepezil  5 mg Oral QHS  . enoxaparin (LOVENOX) injection  30 mg Subcutaneous Q24H  . ferrous sulfate  325 mg Oral Q breakfast  . [START ON 09/17/2016] furosemide  20 mg Oral Daily  . isosorbide-hydrALAZINE  1 tablet Oral TID  . linagliptin  7.5 mg Oral Daily  . oseltamivir  30 mg Oral Daily  . sertraline  100 mg Oral QHS  . sodium chloride flush  3 mL Intravenous Q12H   Continuous Infusions: . sodium chloride       LOS: 1 day    Zohar Maroney Tanna Furry, MD [facility] Pager [phone]  If 7PM-7AM, please contact night-coverage [email or web address] Password [facility] 09/16/2016, 3:37 PM

## 2016-09-16 NOTE — Progress Notes (Signed)
Dr. Carolin Sicks paged about elevated d-dimer.  Will continue to monitor.

## 2016-09-16 NOTE — Progress Notes (Signed)
  Echocardiogram 2D Echocardiogram has been performed.  Jennette Dubin 09/16/2016, 10:30 AM

## 2016-09-16 NOTE — Consult Note (Signed)
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 Elevated troponin - with flat trend suggesting demand ischemia in the setting of known underlying coronary artery disease. Recommend no ischemic evaluation. Given his comorbidities, approach should be conservative medical management. 2. Acute on chronic combined systolic and diastolic heart failure - there is no lower extremity edema but he does have significant CV wave form on jugular vein evaluation and markedly elevated BNP. 3. Tachycardia/bradycardia syndrome with permanent pacemaker present 4. Stage IV chronic kidney disease   PLAN: 1. May be a little volume overloaded based on chest x-ray and BNP. I would continue the patient's diuretic regimen. I would give one dose of IV Lasix. We need to follow kidney function closely. 2. Elevated troponins required no ischemic workup.. 3. Discuss CODE STATUS with the family at an appropriate time this admission.   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 In 2006, chronic combined systolic and diastolic heart failure, tachybradycardia syndrome with pacemaker, paroxysmal atrial fibrillation, not on anticoagulation due to history of bleeding, hypertension, end-stage for chronic kidney disease.  Admitted on this occasion with increasing confusion, nausea, vomiting, and weakness. His wife is recently getting over influenza A. She is his main caregiver. There was concern by the family that he may have acute flu.  PMH:   Past Medical History:  Diagnosis Date  . CAD (coronary artery disease)    a. CABG 1991 and 2006 LIMA LAD, seq. SVG D1-Cfx, SVG d2,and seq. SVG AM-PDA   . Chronic systolic CHF (congestive heart failure) ([place])   . CKD  (chronic kidney disease)   . Dementia   . Depression   . Diabetes mellitus   . HTN (hypertension)   . Left leg DVT ([place])   . Prostate cancer ([place])   . Tachycardia-bradycardia syndrome ([place])     PSH:   Past Surgical History:  Procedure Laterality Date  . CORONARY ARTERY BYPASS GRAFT    . PROSTATECTOMY     Allergies:  Iodinated diagnostic agents; Iohexol; Ace inhibitors; Simvastatin; and Aspirin Prior to Admit Meds:   Prescriptions Prior to Admission  Medication Sig Dispense Refill Last Dose  . atenolol (TENORMIN) 50 MG tablet Take 50 mg by mouth 2 (two) times daily.    09/15/2016 at 0800  . buPROPion (WELLBUTRIN) 100 MG tablet Take 1 tablet by mouth 2 (two) times daily.   2 09/15/2016 at Unknown time  . CALCIUM PO Take 1 tablet by mouth 2 (two) times daily.    09/14/2016 at Unknown time  . Coenzyme Q10 (COQ10 PO) Take 1 capsule by mouth daily.   09/14/2016 at Unknown time  . Docusate Sodium (DOCQLACE PO) Take 2 capsules by mouth 2 (two) times daily.   09/15/2016 at Unknown time  . donepezil (ARICEPT) 5 MG tablet Take 5 mg by mouth at bedtime.   09/14/2016 at Unknown time  . feeding supplement, GLUCERNA SHAKE, (GLUCERNA SHAKE) LIQD Take 237 mLs by mouth 3 (three) times daily as needed (feeding supplement).   09/15/2016 at Unknown time  . ferrous sulfate 325 (65 FE) MG tablet Take 1 tablet (325 mg total) by mouth daily with breakfast. 30 tablet 1 09/15/2016 at Unknown time  . furosemide (LASIX) 20 MG tablet Take 1 tablet (20 mg total) by  mouth daily. 30 tablet 2 09/15/2016 at Unknown time  . Glucose Blood (FREESTYLE LITE TEST VI) USE AS DIRECTED   unknown at unknown  . Insulin Detemir (LEVEMIR) 100 UNIT/ML Pen Inject 8 Units into the skin daily as needed. For blood glucose per sliding scale.   09/15/2016 at Unknown time  . isosorbide-hydrALAZINE (BIDIL) 20-37.5 MG tablet Take 1 tablet by mouth 3 (three) times daily. 90 tablet 1 09/15/2016 at Unknown time  . linagliptin (TRADJENTA) 5 MG TABS tablet  Take 7.5 mg by mouth daily.   09/15/2016 at Unknown time  . Multiple Vitamins-Minerals (ICAPS PO) Take 1 tablet by mouth 2 (two) times daily.   09/14/2016 at Unknown time  . polyethylene glycol (MIRALAX / GLYCOLAX) packet Take 17 g by mouth daily as needed for moderate constipation.   Past Week at Unknown time  . Probiotic Product (ALIGN PO) Take 1 capsule by mouth daily.   09/14/2016 at Unknown time  . ramelteon (ROZEREM) 8 MG tablet Take 8 mg by mouth at bedtime as needed for sleep.   Past Week at Unknown time  . SENNA PO Take 1 tablet by mouth daily.   09/14/2016 at Unknown time  . sertraline (ZOLOFT) 50 MG tablet Take 100 mg by mouth at bedtime.    09/14/2016 at Unknown time  . ondansetron (ZOFRAN) 4 MG tablet Take 1 tablet (4 mg total) by mouth every 6 (six) hours as needed for nausea or vomiting. (Patient not taking: Reported on 09/15/2016) 20 tablet 0 Not Taking at Unknown time   Fam HX:    Family History  Problem Relation Age of Onset  . Diabetes Mother   . Heart disease Sister   . Heart disease Sister   . Heart disease Sister   . Heart disease Sister    Social HX:    Social History   Social History  . Marital status: Married    Spouse name: N/A  . Number of children: N/A  . Years of education: N/A   Occupational History  . Not on file.   Social History Main Topics  . Smoking status: Never Smoker  . Smokeless tobacco: Never Used  . Alcohol use No  . Drug use: No  . Sexual activity: Not on file   Other Topics Concern  . Not on file   Social History Narrative  . No narrative on file     Review of Systems: According to family his appetite has been somewhat decreased. He has been weak and having difficulty ambulating. He has not fallen. There is a prior history of bleeding. Prior history of ?subdural hematoma. No report of blood in the urine or stool. Prior history of GI bleeding. No cough or phlegm production.. All other systems are negative.  Physical Exam: Blood  pressure (!) 142/69, pulse 70, temperature 98 F (36.7 C), temperature source Oral, resp. rate 18, height 5\' 11"  (1.803 m), weight 146 lb 14.4 oz (66.6 kg), SpO2 97 %. Weight change:    Appearance Lying relatively flat in bed without respiratory distress. Skin is warm and dry. Neck exam reveals marked CV wave to the angle of the jaw with the patient lying at 30. Chest is clear anteriorly. Diminished breath sounds are noted in both bases. Cardiac exam reveals a left lower sternal and apical holosystolic murmur compatible with tricuspid regurgitation and probably some mitral regurgitation Abdomen is soft. No tenderness is noted. Extremities reveal no edema. Neuro/psychiatric:  Arousable. He seems to recognize me on entry into  the room. Communication is difficult due to severe hearing loss. Obvious marked decrease in memory. Not agitated. Daughter is at bedside.   Labs: Lab Results  Component Value Date   WBC 4.9 09/15/2016   HGB 12.6 (L) 09/15/2016   HCT 37.2 (L) 09/15/2016   MCV 89.2 09/15/2016   PLT 212 09/15/2016    Recent Labs Lab 09/15/16 1645 09/16/16 0702  NA  --  140  K  --  3.4*  CL  --  106  CO2  --  25  BUN  --  44*  CREATININE  --  2.68*  CALCIUM  --  8.9  PROT 6.2*  --   BILITOT 0.4  --   ALKPHOS 98  --   ALT 32  --   AST 34  --   GLUCOSE  --  141*   No results found for: PTT Lab Results  Component Value Date   INR 1.22 02/10/2016   INR 1.25 02/10/2016   INR 1.36 02/09/2016   Lab Results  Component Value Date   CKTOTAL 52 09/24/2009   CKMB 1.4 09/24/2009   TROPONINI 0.34 (HH) 09/16/2016   BNP    Component Value Date/Time   BNP 2,779.7 (H) 09/15/2016 1645    ProBNP    Component Value Date/Time   PROBNP 172.0 (H) 09/28/2009 0655      Radiology:  Dg Chest 2 View  Result Date: 09/15/2016 CLINICAL DATA:  Shortness of Breath EXAM: CHEST  2 VIEW COMPARISON:  November 12, 2015 FINDINGS: There is airspace consolidation in the left base. There are  small pleural effusions bilaterally. Lungs elsewhere are clear. Heart is borderline enlarged with pulmonary vascularity within normal limits. Patient is status post coronary artery bypass grafting. Pacemaker leads are attached the right atrium and right ventricle. No adenopathy. There is degenerative change in the thoracic spine. There is aortic atherosclerosis. IMPRESSION: Airspace consolidation left base, likely pneumonia. Small pleural effusions bilaterally. Heart is borderline enlarged, stable. Pulmonary vascular within normal limits. There is aortic atherosclerosis. Pacemaker leads attached to right atrium and right ventricle. Electronically Signed   By: Lowella Grip III M.D.   On: 09/15/2016 14:07   Echocardiogram April 2017: Study Conclusions  - Left ventricle: The cavity size was normal. Wall thickness was   increased in a pattern of mild LVH. Systolic function was mildly   to moderately reduced. The estimated ejection fraction was in the   range of 40% to 45%. - Aortic valve: Severely calcified annulus. There was very mild   stenosis. Valve area (VTI): 1.97 cm^2. Valve area (Vmax): 2.02   cm^2. Valve area (Vmean): 2.08 cm^2. - Mitral valve: Moderately calcified annulus. There was moderate   regurgitation. - Left atrium: The atrium was moderately dilated. - Right atrium: The atrium was mildly dilated. - Tricuspid valve: There was moderate regurgitation. - Pulmonary arteries: Systolic pressure was moderately increased.   PA peak pressure: 49 mm Hg (S).    EKG:  AV sequential pacing. Similar appearance when compared to July 2017.    Belva Crome III 09/16/2016 8:57 AM

## 2016-09-16 NOTE — Progress Notes (Signed)
Pt agitated, confused, and restless. Pt has refused meds, telemetry, and IV so far after having pulled out his IV while RN not in room. Pt has a personal caregiver in room with him. 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 RN will reattempt med admin after pt's daughter comes to hospital.

## 2016-09-16 NOTE — Progress Notes (Signed)
Notified [facility] regarding trop levels slightly trending up from .23 to .24. Pt asymptomatic. No orders received.

## 2016-09-16 NOTE — Progress Notes (Signed)
Pt's daughter in room with patient asleep now. Daughter tried to help with getting the patient to take his meds tonight, however we are still unsuccessful. Daughter concerned about pt "desatting", but pt alert (with dementia) and has been 97%-100% on room air. Pt will not allow staff to check his O2 sat. Will still attempt later.   Family would like the order for pharmaceutical VTE prophylaxis D/c'ed due to hx of intracranial hemorrhage. They would also like IV abx switched to PO due to noncompliance/ current refusal. Family also requests that his PRN sleeping med to be given between 6pm-7pm so that he will be more relaxed when nighttime meds are to be given. Will page MD and continue to monitor.

## 2016-09-17 DIAGNOSIS — Z950 Presence of cardiac pacemaker: Secondary | ICD-10-CM

## 2016-09-17 DIAGNOSIS — J181 Lobar pneumonia, unspecified organism: Secondary | ICD-10-CM

## 2016-09-17 DIAGNOSIS — J189 Pneumonia, unspecified organism: Secondary | ICD-10-CM

## 2016-09-17 LAB — URINE CULTURE

## 2016-09-17 LAB — BASIC METABOLIC PANEL
Anion gap: 9 (ref 5–15)
BUN: 42 mg/dL — AB (ref 6–20)
CHLORIDE: 107 mmol/L (ref 101–111)
CO2: 24 mmol/L (ref 22–32)
Calcium: 8.6 mg/dL — ABNORMAL LOW (ref 8.9–10.3)
Creatinine, Ser: 2.91 mg/dL — ABNORMAL HIGH (ref 0.61–1.24)
GFR calc Af Amer: 20 mL/min — ABNORMAL LOW (ref >60)
GFR calc non Af Amer: 17 mL/min — ABNORMAL LOW (ref >60)
GLUCOSE: 211 mg/dL — AB (ref 65–99)
POTASSIUM: 3.8 mmol/L (ref 3.5–5.1)
Sodium: 140 mmol/L (ref 135–145)

## 2016-09-17 LAB — CBC
HEMATOCRIT: 31.8 % — AB (ref 39.0–52.0)
Hemoglobin: 10.7 g/dL — ABNORMAL LOW (ref 13.0–17.0)
MCH: 29.7 pg (ref 26.0–34.0)
MCHC: 33.6 g/dL (ref 30.0–36.0)
MCV: 88.3 fL (ref 78.0–100.0)
Platelets: 191 10*3/uL (ref 150–400)
RBC: 3.6 MIL/uL — ABNORMAL LOW (ref 4.22–5.81)
RDW: 12.8 % (ref 11.5–15.5)
WBC: 4.1 10*3/uL (ref 4.0–10.5)

## 2016-09-17 LAB — HEMOGLOBIN A1C
Hgb A1c MFr Bld: 7.4 % — ABNORMAL HIGH (ref 4.8–5.6)
MEAN PLASMA GLUCOSE: 166 mg/dL

## 2016-09-17 MED ORDER — AZITHROMYCIN 500 MG PO TABS
500.0000 mg | ORAL_TABLET | Freq: Every day | ORAL | Status: DC
  Administered 2016-09-17 – 2016-09-20 (×4): 500 mg via ORAL
  Filled 2016-09-17 (×4): qty 1

## 2016-09-17 MED ORDER — DOXYCYCLINE HYCLATE 100 MG PO TABS
100.0000 mg | ORAL_TABLET | Freq: Once | ORAL | Status: AC
  Administered 2016-09-17: 100 mg via ORAL
  Filled 2016-09-17: qty 1

## 2016-09-17 NOTE — Progress Notes (Signed)
Received a call from [facility] that around 1030-1045H this am, patient had some changes on her rhythm, wherein there was a 2.92 secs where pacer was not capturing. Looking back, I remembered going to the patient's room to give a medication and seeing somebody in the room around 1050H who said she just checked the pacemaker. Called and notified Mancel Bale - NP about this.

## 2016-09-17 NOTE — Progress Notes (Signed)
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 Pt's caregiver reports "he just fell asleep. He is 83yo with the flu, can we please let him sleep and rest." PT to return as able.   Lonnell Chaput M Elmor Kost 09/17/2016, 1:21 PM   Kittie Plater, PT, DPT Pager #: [phone] Office #: [phone]

## 2016-09-17 NOTE — Progress Notes (Signed)
PROGRESS NOTE    [name], MD  [id number] DOB: Jul 06, 1923 DOA: 09/15/2016 PCP: [name], MD   Brief Narrative: 83 y.o. male with medical history significant for CAD status post CABG in 1191 and redo in 2006, chronic combined CHF, tachy-brady syndrome and pacemaker placement, history of paroxysmal a fib but not on anticoagulation due to prior bleeding episodes, dementia. He presented to [facility] with nausea, vomiting and weakness. In the ER patient was found to have trop level 0.21, BNP in 2700 range. CXR showed left base opacity. Assessment & Plan:  # Influenza A positive: -Continue droplet precaution -On Tamiflu 30 mg daily since 2/15 -Continue supportive care  #Left lower lobe pneumonia: Chest x-ray consistent with left base opacity.  Patient is not hypoxic. -Received a dose of doxycycline on 2/15 evening because of poor IV access. Changed to azithromycin to oral. Able to place IV line today therefore will continue ceftriaxone. -Continue DuoNeb nebulizer as needed -Follow up culture results. -Patient has elevated d-dimer with history of Afib. Pulmonary VQ scan low probability for PE. .  # Nausea vomiting likely in the setting of influenza. Continue supportive care. Tolerating diet. Abdomen exam benign.  #Possible acute on chronic combined systolic and diastolic congestive heart failure: -Patient with elevated troponin level and BNP. Likely demand ischemia as per cardiologist. Echocardiogram reviewed which was consistent with reduced EF of 30-35% with wall motion abnormalities. Patient also with a grade 2 diastolic dysfunction.  -Recommended to continue conservative management and oral Lasix as per cardiologist. Monitor kidney function. Patient does not have fluid overload on physical exam. -Continue supportive care. -Patient does not have chest pain  #History of paroxysmal atrial fibrillation: Continue atenolol for rate control. Not on anticoagulation because of history  of prior bleeding episode as per prior chart.  #Chronic kidney disease stage IV: Mildly elevated serum creatinine level today likely hemodynamically mediated in the setting of IV Lasix. Continue to monitor BMP.  #Type 2 diabetes: Currently on Linagliptin. Monitor blood sugar level.  #Essential hypertension: Continue BiDil, atenolol, Lasix. Monitor blood pressure closely. Blood pressure acceptable  #Anxiety depression: On Zoloft.  #Possible dementia without behavioral disturbance: Currently on Aricept. Continue supportive care. -I discussed the goals of care with the patient's daughter at bedside. She agreed to have palliative care consult. Palliative care consult requested. Patient is currently full code. -PT, OT evaluation requested.  Principal Problem:   Left lower lobe pneumonia ([place]) Active Problems:   Pacemaker   Hyperlipidemia   Dementia   Nausea and vomiting   Essential hypertension   Troponin level elevated   Depression   Controlled type 2 diabetes mellitus with diabetic nephropathy, without long-term current use of insulin ([facility])   CKD stage 4 due to type 2 diabetes mellitus ([place])   Influenza with pneumonia   Acute on chronic combined systolic and diastolic heart failure ([facility])  DVT prophylaxis: SCD. Discontinue Lovenox as per patient's daughter request because patient has history of cerebral bleeding. Code Status: Full code Family Communication: I discussed with the patient's daughter who is a physician at bedside Disposition Plan: Likely discharge home in 1-2 days. Order PT, OT eval.  Consultants:   Cardiologist  Procedures: Echocardiogram Antimicrobials: Azithromycin, ceftriaxone and Tamiflu since 2/15  Subjective: Patient was seen and examined at bedside. Reported doing well. Denied chest pain, shortness of breath, nausea or vomiting. Overnight events noted  regarding difficulties with IV access. Patient's daughter at bedside. Objective: Vitals:   09/16/16 2343  09/17/16 0701 09/17/16 0900 09/17/16  1100  BP: (!) 146/63 (!) 143/52 (!) 147/69 (!) 131/56  Pulse: 64 63 67 65  Resp: 18 18  18   Temp: 98 F (36.7 C) 97.8 F (36.6 C)  98.1 F (36.7 C)  TempSrc: Oral Oral  Oral  SpO2: 97% 96%  97%  Weight:  62.8 kg (138 lb 6.4 oz)    Height:        Intake/Output Summary (Last 24 hours) at 09/17/16 1313 Last data filed at 09/17/16 1103  Gross per 24 hour  Intake              600 ml  Output              830 ml  Net             -230 ml   Filed Weights   09/15/16 2026 09/16/16 0500 09/17/16 0701  Weight: 67.5 kg (148 lb 12.8 oz) 66.6 kg (146 lb 14.4 oz) 62.8 kg (138 lb 6.4 oz)    Examination:  General exam: Elderly male lying in bed comfortable, not in distress Respiratory system: Clear bilaterally, respiratory effort normal.  Cardiovascular system: Regular rate rhythm, S1-S2 normal. No pedal edema  Gastrointestinal system: Abdomen is nondistended, soft and nontender. Normal bowel sounds heard. Central nervous system: Alert awake and following commands , unchanged Extremities: Symmetric 5 x 5 power. Skin: No rashes, lesions or ulcers     Data Reviewed: I have personally reviewed following labs and imaging studies  CBC:  Recent Labs Lab 09/15/16 1320 09/17/16 0941  WBC 4.9 4.1  HGB 12.6* 10.7*  HCT 37.2* 31.8*  MCV 89.2 88.3  PLT 212 [id number]   Basic Metabolic Panel:  Recent Labs Lab 09/15/16 1320 09/15/16 1645 09/16/16 0702 09/17/16 0941  NA 143  --  140 140  K 4.0  --  3.4* 3.8  CL 105  --  106 107  CO2 26  --  25 24  GLUCOSE 211*  --  141* 211*  BUN 47*  --  44* 42*  CREATININE 2.89*  --  2.68* 2.91*  CALCIUM 9.9  --  8.9 8.6*  MG  --  2.1  --   --    GFR: Estimated Creatinine Clearance: 14.7 mL/min (by C-G formula based on SCr of 2.91 mg/dL (H)). Liver Function Tests:  Recent Labs Lab 09/15/16 1645  AST 34  ALT 32  ALKPHOS 98  BILITOT 0.4  PROT 6.2*  ALBUMIN 3.3*   No results for input(s): LIPASE, AMYLASE  in the last 168 hours. No results for input(s): AMMONIA in the last 168 hours. Coagulation Profile: No results for input(s): INR, PROTIME in the last 168 hours. Cardiac Enzymes:  Recent Labs Lab 09/15/16 1645 09/15/16 2043 09/16/16 0046 09/16/16 0702  TROPONINI 0.21* 0.23* 0.24* 0.34*   BNP (last 3 results) No results for input(s): PROBNP in the last 8760 hours. HbA1C:  Recent Labs  09/16/16 0046  HGBA1C 7.4*   CBG: No results for input(s): GLUCAP in the last 168 hours. Lipid Profile: No results for input(s): CHOL, HDL, LDLCALC, TRIG, CHOLHDL, LDLDIRECT in the last 72 hours. Thyroid Function Tests: No results for input(s): TSH, T4TOTAL, FREET4, T3FREE, THYROIDAB in the last 72 hours. Anemia Panel: No results for input(s): VITAMINB12, FOLATE, FERRITIN, TIBC, IRON, RETICCTPCT in the last 72 hours. Sepsis Labs:  Recent Labs Lab 09/15/16 1730  LATICACIDVEN 1.63    Recent Results (from the past 240 hour(s))  Urine culture     Status: Abnormal  Collection Time: 09/15/16  4:35 PM  Result Value Ref Range Status   Specimen Description URINE, RANDOM  Final   Special Requests NONE  Final   Culture MULTIPLE SPECIES PRESENT, SUGGEST RECOLLECTION (A)  Final   Report Status 09/17/2016 FINAL  Final  Blood culture (routine x 2)     Status: None (Preliminary result)   Collection Time: 09/15/16  4:45 PM  Result Value Ref Range Status   Specimen Description BLOOD RIGHT ANTECUBITAL  Final   Special Requests BOTTLES DRAWN AEROBIC ONLY 5CC  Final   Culture NO GROWTH 2 DAYS  Final   Report Status PENDING  Incomplete  Blood culture (routine x 2)     Status: None (Preliminary result)   Collection Time: 09/15/16  5:45 PM  Result Value Ref Range Status   Specimen Description BLOOD LEFT ANTECUBITAL  Final   Special Requests IN PEDIATRIC BOTTLE 3CC  Final   Culture NO GROWTH 2 DAYS  Final   Report Status PENDING  Incomplete  Respiratory Panel by PCR     Status: None   Collection  Time: 09/15/16  6:22 PM  Result Value Ref Range Status   Adenovirus NOT DETECTED NOT DETECTED Final   Coronavirus 229E NOT DETECTED NOT DETECTED Final   Coronavirus HKU1 NOT DETECTED NOT DETECTED Final   Coronavirus NL63 NOT DETECTED NOT DETECTED Final   Coronavirus OC43 NOT DETECTED NOT DETECTED Final   Metapneumovirus NOT DETECTED NOT DETECTED Final   Rhinovirus / Enterovirus NOT DETECTED NOT DETECTED Final   Influenza A NOT DETECTED NOT DETECTED Final   Influenza B NOT DETECTED NOT DETECTED Final   Parainfluenza Virus 1 NOT DETECTED NOT DETECTED Final   Parainfluenza Virus 2 NOT DETECTED NOT DETECTED Final   Parainfluenza Virus 3 NOT DETECTED NOT DETECTED Final   Parainfluenza Virus 4 NOT DETECTED NOT DETECTED Final   Respiratory Syncytial Virus NOT DETECTED NOT DETECTED Final   Bordetella pertussis NOT DETECTED NOT DETECTED Final   Chlamydophila pneumoniae NOT DETECTED NOT DETECTED Final   Mycoplasma pneumoniae NOT DETECTED NOT DETECTED Final         Radiology Studies: Dg Chest 2 View  Result Date: 09/15/2016 CLINICAL DATA:  Shortness of Breath EXAM: CHEST  2 VIEW COMPARISON:  November 12, 2015 FINDINGS: There is airspace consolidation in the left base. There are small pleural effusions bilaterally. Lungs elsewhere are clear. Heart is borderline enlarged with pulmonary vascularity within normal limits. Patient is status post coronary artery bypass grafting. Pacemaker leads are attached the right atrium and right ventricle. No adenopathy. There is degenerative change in the thoracic spine. There is aortic atherosclerosis. IMPRESSION: Airspace consolidation left base, likely pneumonia. Small pleural effusions bilaterally. Heart is borderline enlarged, stable. Pulmonary vascular within normal limits. There is aortic atherosclerosis. Pacemaker leads attached to right atrium and right ventricle. Electronically Signed   By: Lowella Grip III M.D.   On: 09/15/2016 14:07   Nm Pulmonary  Perf And Vent  Result Date: 09/16/2016 CLINICAL DATA:  83 year old with shortness of breath. History of left leg DVT. EXAM: NUCLEAR MEDICINE VENTILATION - PERFUSION LUNG SCAN TECHNIQUE: Ventilation images were obtained in multiple projections using inhaled aerosol Tc-76m DTPA. Perfusion images were obtained in multiple projections after intravenous injection of Tc-30m MAA. RADIOPHARMACEUTICALS:  30 mCi Technetium-12m DTPA aerosol inhalation and 4 mCi Technetium-61m MAA IV COMPARISON:  Chest radiograph 09/15/2016 FINDINGS: Ventilation: Matched defect in the anterior right lung. Otherwise, no significant ventilation abnormalities. Perfusion: Matched defect in the left upper chest  related to the cardiac pacemaker. There is a band like defect in the anterior right lung which is matched on both the ventilation and perfusion images. No suspicious mismatch defects. IMPRESSION: Low probability for pulmonary embolism. Electronically Signed   By: Markus Daft M.D.   On: 09/16/2016 17:34        Scheduled Meds: . atenolol  50 mg Oral BID  . azithromycin  500 mg Oral Daily  . buPROPion  100 mg Oral BID  . cefTRIAXone (ROCEPHIN)  IV  1 g Intravenous Q24H  . donepezil  5 mg Oral QHS  . ferrous sulfate  325 mg Oral Q breakfast  . furosemide  20 mg Oral Daily  . isosorbide-hydrALAZINE  1 tablet Oral TID  . linagliptin  7.5 mg Oral Daily  . oseltamivir  30 mg Oral Daily  . sertraline  100 mg Oral QHS  . sodium chloride flush  3 mL Intravenous Q12H   Continuous Infusions: . sodium chloride       LOS: 2 days    Dron Tanna Furry, MD [facility] Pager [phone]  If 7PM-7AM, please contact night-coverage [email or web address] Password [email or web address] 09/17/2016, 1:13 PM

## 2016-09-17 NOTE — Care Management Note (Signed)
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

## 2016-09-17 NOTE — Progress Notes (Signed)
Pt's family does not want the patient to take Aricept during this admission. Will continue to monitor.

## 2016-09-17 NOTE — Progress Notes (Addendum)
Progress Note  Patient Name: Paul Rounds, MD Date of Encounter: 09/17/2016  Primary Cardiologist: Linard Millers, III  Subjective   Lying in bed. Somewhat confused. In no respiratory distress. Complaining of interscapular pain. Device representative present and interrogating his pacemaker.   Inpatient Medications    Scheduled Meds: . atenolol  50 mg Oral BID  . azithromycin  500 mg Oral Daily  . buPROPion  100 mg Oral BID  . cefTRIAXone (ROCEPHIN)  IV  1 g Intravenous Q24H  . donepezil  5 mg Oral QHS  . ferrous sulfate  325 mg Oral Q breakfast  . furosemide  20 mg Oral Daily  . isosorbide-hydrALAZINE  1 tablet Oral TID  . linagliptin  7.5 mg Oral Daily  . oseltamivir  30 mg Oral Daily  . sertraline  100 mg Oral QHS  . sodium chloride flush  3 mL Intravenous Q12H   Continuous Infusions: . sodium chloride     PRN Meds: hydrALAZINE, ipratropium-albuterol, ondansetron **OR** ondansetron (ZOFRAN) IV, ramelteon, technetium TC 51M diethylenetriame-pentaacetic acid   Vital Signs    Vitals:   09/16/16 1951 09/16/16 2343 09/17/16 0701 09/17/16 0900  BP: (!) 152/64 (!) 146/63 (!) 143/52 (!) 147/69  Pulse: 73 64 63 67  Resp: 18 18 18    Temp: 98.2 F (36.8 C) 98 F (36.7 C) 97.8 F (36.6 C)   TempSrc: Oral Oral Oral   SpO2: 97% 97% 96%   Weight:   138 lb 6.4 oz (62.8 kg)   Height:        Intake/Output Summary (Last 24 hours) at 09/17/16 0945 Last data filed at 09/17/16 0646  Gross per 24 hour  Intake              600 ml  Output              700 ml  Net             -100 ml   Filed Weights   09/15/16 2026 09/16/16 0500 09/17/16 0701  Weight: 148 lb 12.8 oz (67.5 kg) 146 lb 14.4 oz (66.6 kg) 138 lb 6.4 oz (62.8 kg)    Telemetry    Pacing as noted with heart rate up into the 90s at times. No obvious atrial fibrillation. - Personally Reviewed  ECG    No new tracing. - Personally Reviewed  Physical Exam  Frail appearing. No distress. GEN: No acute distress.    Neck: No JVD Cardiac: RRR, no murmurs, rubs, or gallops.  Respiratory: Clear to auscultation bilaterally. GI: Soft, nontender, non-distended  MS: No edema; No deformity. Neuro:  Nonfocal  Psych: Normal affect   Labs    Chemistry Recent Labs Lab 09/15/16 1320 09/15/16 1645 09/16/16 0702  NA 143  --  140  K 4.0  --  3.4*  CL 105  --  106  CO2 26  --  25  GLUCOSE 211*  --  141*  BUN 47*  --  44*  CREATININE 2.89*  --  2.68*  CALCIUM 9.9  --  8.9  PROT  --  6.2*  --   ALBUMIN  --  3.3*  --   AST  --  34  --   ALT  --  32  --   ALKPHOS  --  98  --   BILITOT  --  0.4  --   GFRNONAA 18*  --  19*  GFRAA 20*  --  22*  ANIONGAP 12  --  9     Hematology Recent Labs Lab 09/15/16 1320  WBC 4.9  RBC 4.17*  HGB 12.6*  HCT 37.2*  MCV 89.2  MCH 30.2  MCHC 33.9  RDW 13.3  PLT 212    Cardiac Enzymes Recent Labs Lab 09/15/16 1645 09/15/16 2043 09/16/16 0046 09/16/16 0702  TROPONINI 0.21* 0.23* 0.24* 0.34*   No results for input(s): TROPIPOC in the last 168 hours.   BNP Recent Labs Lab 09/15/16 1645  BNP 2,779.7*     DDimer  Recent Labs Lab 09/16/16 1110  DDIMER 1.56*     Radiology    Dg Chest 2 View  Result Date: 09/15/2016 CLINICAL DATA:  Shortness of Breath EXAM: CHEST  2 VIEW COMPARISON:  November 12, 2015 FINDINGS: There is airspace consolidation in the left base. There are small pleural effusions bilaterally. Lungs elsewhere are clear. Heart is borderline enlarged with pulmonary vascularity within normal limits. Patient is status post coronary artery bypass grafting. Pacemaker leads are attached the right atrium and right ventricle. No adenopathy. There is degenerative change in the thoracic spine. There is aortic atherosclerosis. IMPRESSION: Airspace consolidation left base, likely pneumonia. Small pleural effusions bilaterally. Heart is borderline enlarged, stable. Pulmonary vascular within normal limits. There is aortic atherosclerosis. Pacemaker leads  attached to right atrium and right ventricle. Electronically Signed   By: Lowella Grip III M.D.   On: 09/15/2016 14:07   Nm Pulmonary Perf And Vent  Result Date: 09/16/2016 CLINICAL DATA:  83 year old with shortness of breath. History of left leg DVT. EXAM: NUCLEAR MEDICINE VENTILATION - PERFUSION LUNG SCAN TECHNIQUE: Ventilation images were obtained in multiple projections using inhaled aerosol Tc-4m DTPA. Perfusion images were obtained in multiple projections after intravenous injection of Tc-72m MAA. RADIOPHARMACEUTICALS:  30 mCi Technetium-107m DTPA aerosol inhalation and 4 mCi Technetium-12m MAA IV COMPARISON:  Chest radiograph 09/15/2016 FINDINGS: Ventilation: Matched defect in the anterior right lung. Otherwise, no significant ventilation abnormalities. Perfusion: Matched defect in the left upper chest related to the cardiac pacemaker. There is a band like defect in the anterior right lung which is matched on both the ventilation and perfusion images. No suspicious mismatch defects. IMPRESSION: Low probability for pulmonary embolism. Electronically Signed   By: Markus Daft M.D.   On: 09/16/2016 17:34    Cardiac Studies   09/16/16 pulmonary ventilation perfusion study: IMPRESSION: Low probability for pulmonary embolism.  09/16/16 echocardiogram: Study Conclusions  - Left ventricle: The cavity size was normal. Wall thickness was   normal. Systolic function was moderately to severely reduced. The   estimated ejection fraction was in the range of 30% to 35%.   Moderate diffuse hypokinesis with distinct regional wall motion   abnormalities. Severe hypokinesis of the entireinferolateral   myocardium; consistent with infarction in the distribution of the   left circumflex coronary artery. Features are consistent with a   pseudonormal left ventricular filling pattern, with concomitant   abnormal relaxation and increased filling pressure (grade 2   diastolic dysfunction). - Ventricular  septum: Septal motion showed abnormal function,   dyssynergy, and paradox. These changes are consistent with right   ventricular pacing. - Aortic valve: There was mild regurgitation. - Mitral valve: There was mild to moderate regurgitation directed   centrally. - Left atrium: The atrium was mildly dilated. - Right ventricle: Systolic function was mildly to moderately   reduced. - Right atrium: The atrium was mildly dilated. - Tricuspid valve: There was moderate regurgitation. - Pulmonary arteries: Systolic pressure was moderately increased.  PA peak pressure: 59 mm Hg (S).  Impressions:  - There is marked pacing-induced apex-to-base and septal-to lateral   wall dyssynchrony, which makes regionall motion assessment   challenging. Nevertheless, there appears to be distinctly worse   inferolateral wall contractility, compared to generalized   moderate LV hypokinesis.    Patient Profile     83 y.o. male with multiple comorbidities including CK D stage IV, coronary artery disease with bypass grafting on 2 prior occasions most recently 2006, hypertension, chronic combined systolic and diastolic heart failure, tachycardia bradycardia syndrome with permanent pacemaker. LVEF 30-35% on updated echo 2018.  Assessment & Plan    1. Acute on chronic combined systolic and diastolic heart failure, with significant reduction in LV function since prior LV assessment (EF drop from 40-45% to 30-35%). I would recommend giving additional IV furosemide. Kidney function needs to be monitored closely. Drop in LVEF is of uncertain etiology. Could be related to CAD with silent occlusion versus right ventricular pacing. At his age, I do not believe he is a candidate for resynchronization therapy. We should probably ask EP to see if reprogramming would be of any assistance.  Despite the reduced LVEF, I do not feel that he has grossly volume overloaded. I would recommend continuing his outpatient diuretic  regimen if he has adequate intake to support intravascular volume.  2. ?Pacemaker induced LV dyssynergy with decreased EF. Pacer indication is [employer]  3. Hypokalemia needs to be repleted.  4. Elevated troponin, likely demand related  5. Dementia   Goals of care and CODE STATUS were discussed with his oldest daughter, Blakley Laverde M.D. He is still a full code, based upon previously expressed desire by the patient. I encouraged reconsideration as CPR and intubation at this point would likely produce suffering without any significant benefit.  Signed, Sinclair Grooms, MD  09/17/2016, 9:45 AM

## 2016-09-17 NOTE — Progress Notes (Signed)
Pt is a very hard stick and family requesting he not get another IV tonight and give po antibiotics instead. NP spoke with daughter, who is a GI MD in [place], about situation. She says pt has been stuck about 20 times today and had to have previous IV placed under [place]. She wishes not to pursue IV tonight and may try again tomorrow. Discussed that IV abx are the best choice this early in the hospitalization. She understands but wants to wait on IV until tomorrow. Pt's Qtc is over 500, so will use Doxycycline tonight x 1 dose. Hopefully, can get pt back on IV meds in am. Report left for attending.  KJKG, NP [facility]

## 2016-09-17 NOTE — Progress Notes (Signed)
No charge note  Palliative Medicine consult noted. Due to high referral volume, there may be a delay seeing this patient. Please call the Palliative Medicine Team office at [phone] if recommendations are needed in the interim.  Thank you for inviting us to see this patient.  Camilla Skeen [place], PA-C Palliative Medicine [phone]   

## 2016-09-17 NOTE — Progress Notes (Signed)
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 Pt sleeping heavily upon entering room, unable to arouse. Caregiver present and states that pt has been sleeping soundly. Will check back later today as able  Britt Bottom 09/17/2016, 1:03 PM

## 2016-09-17 NOTE — Progress Notes (Signed)
Need for IV abx explained to pt's family. Family still declines IV and any more IV reinsertion attempts. Daughter states that an MD in the ED with an ultrasound machine had to place the recently pulled out IV. RN explained that we have an excellent IV team. Daughter declines, stating he has bad veins, he's been stuck too many times, and "IV will just not work, so we need to come off of that idea". Daughter adamant about switching IV abx to PO abx. MD paged again. Will continue to monitor.

## 2016-09-18 DIAGNOSIS — B59 Pneumocystosis: Secondary | ICD-10-CM

## 2016-09-18 DIAGNOSIS — Z515 Encounter for palliative care: Secondary | ICD-10-CM

## 2016-09-18 LAB — CBC
HEMATOCRIT: 32 % — AB (ref 39.0–52.0)
HEMOGLOBIN: 10.8 g/dL — AB (ref 13.0–17.0)
MCH: 29.9 pg (ref 26.0–34.0)
MCHC: 33.8 g/dL (ref 30.0–36.0)
MCV: 88.6 fL (ref 78.0–100.0)
Platelets: 171 10*3/uL (ref 150–400)
RBC: 3.61 MIL/uL — AB (ref 4.22–5.81)
RDW: 13 % (ref 11.5–15.5)
WBC: 4.4 10*3/uL (ref 4.0–10.5)

## 2016-09-18 LAB — BASIC METABOLIC PANEL
ANION GAP: 10 (ref 5–15)
BUN: 41 mg/dL — ABNORMAL HIGH (ref 6–20)
CHLORIDE: 108 mmol/L (ref 101–111)
CO2: 24 mmol/L (ref 22–32)
Calcium: 8.7 mg/dL — ABNORMAL LOW (ref 8.9–10.3)
Creatinine, Ser: 2.92 mg/dL — ABNORMAL HIGH (ref 0.61–1.24)
GFR calc non Af Amer: 17 mL/min — ABNORMAL LOW (ref >60)
GFR, EST AFRICAN AMERICAN: 20 mL/min — AB (ref >60)
Glucose, Bld: 194 mg/dL — ABNORMAL HIGH (ref 65–99)
Potassium: 3.8 mmol/L (ref 3.5–5.1)
Sodium: 142 mmol/L (ref 135–145)

## 2016-09-18 MED ORDER — RAMELTEON 8 MG PO TABS
8.0000 mg | ORAL_TABLET | Freq: Every evening | ORAL | Status: DC | PRN
  Administered 2016-09-18 – 2016-09-19 (×2): 8 mg via ORAL
  Filled 2016-09-18 (×3): qty 1

## 2016-09-18 NOTE — Progress Notes (Signed)
Patient hard of hearing, didn't eat much food today. Has a sitter that helps him. Patient had no complaints, sitter said that staff was rude.

## 2016-09-18 NOTE — Consult Note (Signed)
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   He has been treated with antibiotics and tamiflu as well as supportive care.  His troponin was elevated on admission and cardiology has followed him thru this hospitalization.  There was a component of acute on chronic systolic heart failure and an updated echo showed a decrease in EF to 30 - 35% as well as valvular regurg and some element of pulmonary hypertension.  The patient has stabilized and will likely be ready for D/C soon.  PMT was consulted to meet with the family and address GOC.  Clinical Assessment and Goals of Care: I met with the patient at bedside.  He is hard of hearing, demented and fatigued so our conversation was short.  He has a privately hired care taker at his bedside.  The patient tells me he is in no discomfort and wants to go home.  He goes on to tell me about his two daughters, one is a GI MD and the other an ER MD.    After speaking with my attending and reviewing the cardiology and [place] notes, and examining the patient -  it appears advisable to recommend DNR status with no escalation of care to the family.  After leaving 2 voice mail messages for [place], I left one for [place] as well.  If I do not hear back from the family today, I will attempt to reach them tomorrow if Mr. Barnaby is still inpatient.   Primary Decision Maker:  NEXT OF KIN daughters Collette and Tanise     SUMMARY OF RECOMMENDATIONS    Code Status/Advance Care Planning:  Full code    Symptom Management:   Per primary team.  Patient appears comfortable.  Prognosis:   < 6 months given heart failure, pulmonary hypertension and advanced kidney disease.  Discharge Planning: to home with 24 hour care,  and potentially Hospice or Palliative care follow up if dtr is agreeable.      Primary Diagnoses: Present on Admission: . Left lower lobe pneumonia ([place]) . Nausea and vomiting . Pacemaker . Essential hypertension . Hyperlipidemia . Troponin level elevated . Depression . Controlled type 2 diabetes mellitus with diabetic nephropathy, without long-term current use of insulin ([place]) . Dementia . CKD stage 4 due to type 2 diabetes mellitus ([place]) . Influenza with pneumonia . Acute on chronic combined systolic and diastolic heart failure ([place])   I have reviewed the medical record, interviewed the patient and family, and examined the patient. The following aspects are pertinent.  Past Medical History:  Diagnosis Date  . CAD (coronary artery disease)  a. CABG 1991 and 2006 LIMA LAD, seq. SVG D1-Cfx, SVG d2,and seq. SVG AM-PDA   . Chronic systolic CHF (congestive heart failure) ([place])   . CKD (chronic kidney disease)   . Dementia   . Depression   . Diabetes mellitus   . HTN (hypertension)   . Left leg DVT ([place])   . Prostate cancer ([place])   . Tachycardia-bradycardia syndrome [facility])    Social History   Social History  . Marital status: Married    Spouse name: N/A  . Number of children: N/A  . Years of education: N/A   Social History Main Topics  . Smoking status: Never Smoker  . Smokeless tobacco: Never Used  . Alcohol use No  . Drug use: No  . Sexual activity: Not Asked   Other Topics Concern  . None   Social History Narrative  . None   Family History  Problem Relation Age of Onset  . Diabetes Mother   . Heart disease Sister   . Heart disease Sister   .  Heart disease Sister   . Heart disease Sister    Scheduled Meds: . atenolol  50 mg Oral BID  . azithromycin  500 mg Oral Daily  . buPROPion  100 mg Oral BID  . cefTRIAXone (ROCEPHIN)  IV  1 g Intravenous Q24H  . donepezil  5 mg Oral QHS  . ferrous sulfate  325 mg Oral Q breakfast  . furosemide  20 mg Oral Daily  . isosorbide-hydrALAZINE  1 tablet Oral TID  . linagliptin  7.5 mg Oral Daily  . oseltamivir  30 mg Oral Daily  . sertraline  100 mg Oral QHS  . sodium chloride flush  3 mL Intravenous Q12H   Continuous Infusions: . sodium chloride     PRN Meds:.hydrALAZINE, ipratropium-albuterol, ondansetron **OR** ondansetron (ZOFRAN) IV, ramelteon, technetium TC 46M diethylenetriame-pentaacetic acid Allergies  Allergen Reactions  . Iodinated Diagnostic Agents Anaphylaxis  . Iohexol Anaphylaxis     Desc: RN states pt stated he had anyphalactic shock reaction to contrast approx 10 years ago.   . Ace Inhibitors Other (See Comments)    cough  . Simvastatin Other (See Comments)    Muscle aches  . Aspirin     Other reaction(s): Abdominal Pain   Review of Systems patient demented.  Physical Exam  Wd, thin, elderly male, pleasant, HOH, appears fatigued CV rrr Resp no distress Abdomen no distention Extremities trace edema  Vital Signs: BP (!) 146/71 (BP Location: Right Arm)   Pulse 64   Temp 98 F (36.7 C) (Oral)   Resp 18   Ht '5\' 11"'$  (1.803 m)   Wt 61.9 kg (136 lb 6.4 oz)   SpO2 97%   BMI 19.02 kg/m  Pain Assessment: No/denies pain   Pain Score: 0-No pain   SpO2: SpO2: 97 % O2 Device:SpO2: 97 % O2 Flow Rate: .   IO: Intake/output summary:  Intake/Output Summary (Last 24 hours) at 09/18/16 1010 Last data filed at 09/18/16 0600  Gross per 24 hour  Intake              390 ml  Output              750 ml  Net             -360 ml    LBM: Last BM Date: 09/16/16 Baseline Weight: Weight: 67.5 kg (148 lb 12.8 oz) Most recent weight: Weight: 61.9 kg (136 lb 6.4 oz)  Palliative Assessment/Data:   Flowsheet Rows   Flowsheet Row Most Recent Value  Intake Tab  Referral Department  Hospitalist  Unit at Time of Referral  Med/Surg Unit  Palliative Care Primary Diagnosis  Cardiac  Date Notified  09/17/16  Palliative Care Type  New Palliative care  Reason for referral  Clarify Goals of Care  Date of Admission  09/15/16  Date first seen by Palliative Care  09/18/16  # of days Palliative referral response time  1 Day(s)  # of days IP prior to Palliative referral  2  Clinical Assessment  Palliative Performance Scale Score  30%  Psychosocial & Spiritual Assessment  Palliative Care Outcomes  Patient/Family meeting held?  Yes  Who was at the meeting?  patient.  Attempted to reach dtr on the phone  Palliative Care Outcomes  Clarified goals of care      Time Total: 50 min. Greater than 50%  of this time was spent counseling and coordinating care related to the above assessment and plan.  Signed by: Imogene Burn, PA-C Palliative Medicine Pager: [phone]  Please contact Palliative Medicine Team phone at [phone] for questions and concerns.  For individual provider: See Shea Evans

## 2016-09-18 NOTE — Progress Notes (Addendum)
PROGRESS NOTE    Paul Rounds, MD  QI:[id number] DOB: Dec 17, 1922 DOA: 09/15/2016 PCP: [name], MD   Brief Narrative: 83 y.o. male with medical history significant for CAD status post CABG in 1191 and redo in 2006, chronic combined CHF, tachy-brady syndrome and pacemaker placement, history of paroxysmal a fib but not on anticoagulation due to prior bleeding episodes, dementia. He presented to [facility] with nausea, vomiting and weakness. In the ER patient was found to have trop level 0.21, BNP in 2700 range. CXR showed left base opacity. Assessment & Plan:  # Influenza A positive: -Continue droplet precaution -On Tamiflu 30 mg daily since 2/15 -Continue supportive care  #Left lower lobe pneumonia: Chest x-ray consistent with left base opacity.  Patient is not hypoxic. -Continue ceftriaxone and azithromycin. Patient is clinically stable. -Continue DuoNeb nebulizer as needed -Follow up culture results. No growth so far. -Patient has elevated d-dimer with history of Afib. Pulmonary VQ scan low probability for PE. .  # Nausea vomiting likely in the setting of influenza. Continue supportive care. Tolerating diet. Abdomen exam benign. -Stable today.  #Possible acute on chronic combined systolic and diastolic congestive heart failure: -Patient with elevated troponin level and BNP. Likely demand ischemia as per cardiologist. Echocardiogram reviewed which was consistent with reduced EF of 30-35% with wall motion abnormalities. Patient also with a grade 2 diastolic dysfunction.  -Recommended to continue conservative management and oral Lasix as per cardiologist. Monitor kidney function. Patient does not have fluid overload on physical exam. -Continue supportive care. -Patient does not have chest pain. -Discussed with the palliative care team today. Evaluation ongoing. Follow-up with cardiologist further plan of care.  Patient's daughter paged me and we had all long discussion regarding  today's update. As per patient's daughter, she spoke with Dr. Tamala Julian from cardiologist yesterday who stated that he would call his EP colleague to evaluate the patient. The daughter was wondering if EP has seen the patient yet. I was not able to locate the note or any information about that. I discussed with the patient's daughter that I can talk to the EP on call or cardiology on call today regarding the consult. She declined to call EP on call today because Dr. Tamala Julian might have discussed with his colleague regarding the plan of care. She wants me to wait if EP will be see the patient today, who was consulted by Dr. Tamala Julian.   #History of paroxysmal atrial fibrillation: Continue atenolol for rate control. Not on anticoagulation because of history of prior bleeding episode as per prior chart. Heart rate controlled  #Chronic kidney disease stage IV: Serum creatinine level is stable. Monitor BMP. Avoid nephrotoxins. Patient is nonoliguric.  #Type 2 diabetes: Currently on Linagliptin. Monitor blood sugar level.  #Essential hypertension: Continue BiDil, atenolol, Lasix. Monitor blood pressure closely. Blood pressure acceptable  #Anxiety depression: On Zoloft.  #Possible dementia without behavioral disturbance: Currently on Aricept. Continue supportive care. -Discussed with the palliative care team. Trying to discussed with the patient's family member regarding goals of care discussion. PT OT recommended home care and supervision. Patient will likely discharge home tomorrow.   Principal Problem:   Left lower lobe pneumonia (HCC) Active Problems:   Pacemaker   Hyperlipidemia   Dementia   Nausea and vomiting   Essential hypertension   Troponin level elevated   Depression   Controlled type 2 diabetes mellitus with diabetic nephropathy, without long-term current use of insulin ([place])   CKD stage 4 due to type 2 diabetes mellitus (  [place])   Influenza with pneumonia   Acute on chronic combined systolic  and diastolic heart failure ([place])   Community acquired pneumonia of left lower lobe of lung ([place])  DVT prophylaxis: SCD. Discontinue Lovenox as per patient's daughter request because patient has history of cerebral bleeding. Code Status: Full code Family Communication: Discussed with the patient's daughter over the phone in detail.. Patient's caretaker was present. Disposition Plan: Likely discharge home in 1-2 days. Order PT, OT eval.  Consultants:   Cardiologist  Procedures: Echocardiogram Antimicrobials: Azithromycin, ceftriaxone and Tamiflu since 2/15  Subjective: Patient was seen and examined at bedside. No new event. Denied chest pain, shortness of breath, nausea or vomiting. Eating well. Objective: Vitals:   09/17/16 1100 09/17/16 1941 09/18/16 0527 09/18/16 1058  BP: (!) 131/56 (!) 140/45 (!) 146/71 (!) 155/81  Pulse: 65 62 64 65  Resp: 18 18 18 18   Temp: 98.1 F (36.7 C) 98 F (36.7 C) 98 F (36.7 C) 97.8 F (36.6 C)  TempSrc: Oral Oral Oral Oral  SpO2: 97% 98% 97% 99%  Weight:   61.9 kg (136 lb 6.4 oz)   Height:        Intake/Output Summary (Last 24 hours) at 09/18/16 1147 Last data filed at 09/18/16 1052  Gross per 24 hour  Intake              390 ml  Output              700 ml  Net             -310 ml   Filed Weights   09/16/16 0500 09/17/16 0701 09/18/16 0527  Weight: 66.6 kg (146 lb 14.4 oz) 62.8 kg (138 lb 6.4 oz) 61.9 kg (136 lb 6.4 oz)    Examination:  General exam: Elderly male lying in bed comfortable, not in distress Respiratory system: Clear bilaterally, respiratory effort normal Cardiovascular system: Regular rate rhythm, S1-S2 normal. No pedal edema.  Gastrointestinal system: Abdomen is soft, nontender and nondistended. Bowel sound positive.. Central nervous system: Alert awake and following commands.  Extremities: Symmetric 5 x 5 power. Skin: No rashes, lesions or ulcers     Data Reviewed: I have personally reviewed following labs and  imaging studies  CBC:  Recent Labs Lab 09/15/16 1320 09/17/16 0941 09/18/16 0516  WBC 4.9 4.1 4.4  HGB 12.6* 10.7* 10.8*  HCT 37.2* 31.8* 32.0*  MCV 89.2 88.3 88.6  PLT 212 191 [id number]   Basic Metabolic Panel:  Recent Labs Lab 09/15/16 1320 09/15/16 1645 09/16/16 0702 09/17/16 0941 09/18/16 0516  NA 143  --  140 140 142  K 4.0  --  3.4* 3.8 3.8  CL 105  --  106 107 108  CO2 26  --  25 24 24   GLUCOSE 211*  --  141* 211* 194*  BUN 47*  --  44* 42* 41*  CREATININE 2.89*  --  2.68* 2.91* 2.92*  CALCIUM 9.9  --  8.9 8.6* 8.7*  MG  --  2.1  --   --   --    GFR: Estimated Creatinine Clearance: 14.4 mL/min (by C-G formula based on SCr of 2.92 mg/dL (H)). Liver Function Tests:  Recent Labs Lab 09/15/16 1645  AST 34  ALT 32  ALKPHOS 98  BILITOT 0.4  PROT 6.2*  ALBUMIN 3.3*   No results for input(s): LIPASE, AMYLASE in the last 168 hours. No results for input(s): AMMONIA in the last 168 hours. Coagulation Profile: No  results for input(s): INR, PROTIME in the last 168 hours. Cardiac Enzymes:  Recent Labs Lab 09/15/16 1645 09/15/16 2043 09/16/16 0046 09/16/16 0702  TROPONINI 0.21* 0.23* 0.24* 0.34*   BNP (last 3 results) No results for input(s): PROBNP in the last 8760 hours. HbA1C:  Recent Labs  09/16/16 0046  HGBA1C 7.4*   CBG: No results for input(s): GLUCAP in the last 168 hours. Lipid Profile: No results for input(s): CHOL, HDL, LDLCALC, TRIG, CHOLHDL, LDLDIRECT in the last 72 hours. Thyroid Function Tests: No results for input(s): TSH, T4TOTAL, FREET4, T3FREE, THYROIDAB in the last 72 hours. Anemia Panel: No results for input(s): VITAMINB12, FOLATE, FERRITIN, TIBC, IRON, RETICCTPCT in the last 72 hours. Sepsis Labs:  Recent Labs Lab 09/15/16 1730  LATICACIDVEN 1.63    Recent Results (from the past 240 hour(s))  Urine culture     Status: Abnormal   Collection Time: 09/15/16  4:35 PM  Result Value Ref Range Status   Specimen Description  URINE, RANDOM  Final   Special Requests NONE  Final   Culture MULTIPLE SPECIES PRESENT, SUGGEST RECOLLECTION (A)  Final   Report Status 09/17/2016 FINAL  Final  Blood culture (routine x 2)     Status: None (Preliminary result)   Collection Time: 09/15/16  4:45 PM  Result Value Ref Range Status   Specimen Description BLOOD RIGHT ANTECUBITAL  Final   Special Requests BOTTLES DRAWN AEROBIC ONLY 5CC  Final   Culture NO GROWTH 3 DAYS  Final   Report Status PENDING  Incomplete  Blood culture (routine x 2)     Status: None (Preliminary result)   Collection Time: 09/15/16  5:45 PM  Result Value Ref Range Status   Specimen Description BLOOD LEFT ANTECUBITAL  Final   Special Requests IN PEDIATRIC BOTTLE 3CC  Final   Culture NO GROWTH 3 DAYS  Final   Report Status PENDING  Incomplete  Respiratory Panel by PCR     Status: None   Collection Time: 09/15/16  6:22 PM  Result Value Ref Range Status   Adenovirus NOT DETECTED NOT DETECTED Final   Coronavirus 229E NOT DETECTED NOT DETECTED Final   Coronavirus HKU1 NOT DETECTED NOT DETECTED Final   Coronavirus NL63 NOT DETECTED NOT DETECTED Final   Coronavirus OC43 NOT DETECTED NOT DETECTED Final   Metapneumovirus NOT DETECTED NOT DETECTED Final   Rhinovirus / Enterovirus NOT DETECTED NOT DETECTED Final   Influenza A NOT DETECTED NOT DETECTED Final   Influenza B NOT DETECTED NOT DETECTED Final   Parainfluenza Virus 1 NOT DETECTED NOT DETECTED Final   Parainfluenza Virus 2 NOT DETECTED NOT DETECTED Final   Parainfluenza Virus 3 NOT DETECTED NOT DETECTED Final   Parainfluenza Virus 4 NOT DETECTED NOT DETECTED Final   Respiratory Syncytial Virus NOT DETECTED NOT DETECTED Final   Bordetella pertussis NOT DETECTED NOT DETECTED Final   Chlamydophila pneumoniae NOT DETECTED NOT DETECTED Final   Mycoplasma pneumoniae NOT DETECTED NOT DETECTED Final         Radiology Studies: Nm Pulmonary Perf And Vent  Result Date: 09/16/2016 CLINICAL DATA:   83 year old with shortness of breath. History of left leg DVT. EXAM: NUCLEAR MEDICINE VENTILATION - PERFUSION LUNG SCAN TECHNIQUE: Ventilation images were obtained in multiple projections using inhaled aerosol Tc-16m DTPA. Perfusion images were obtained in multiple projections after intravenous injection of Tc-40m MAA. RADIOPHARMACEUTICALS:  30 mCi Technetium-19m DTPA aerosol inhalation and 4 mCi Technetium-54m MAA IV COMPARISON:  Chest radiograph 09/15/2016 FINDINGS: Ventilation: Matched defect in the  anterior right lung. Otherwise, no significant ventilation abnormalities. Perfusion: Matched defect in the left upper chest related to the cardiac pacemaker. There is a band like defect in the anterior right lung which is matched on both the ventilation and perfusion images. No suspicious mismatch defects. IMPRESSION: Low probability for pulmonary embolism. Electronically Signed   By: Markus Daft M.D.   On: 09/16/2016 17:34        Scheduled Meds: . atenolol  50 mg Oral BID  . azithromycin  500 mg Oral Daily  . buPROPion  100 mg Oral BID  . cefTRIAXone (ROCEPHIN)  IV  1 g Intravenous Q24H  . donepezil  5 mg Oral QHS  . ferrous sulfate  325 mg Oral Q breakfast  . furosemide  20 mg Oral Daily  . isosorbide-hydrALAZINE  1 tablet Oral TID  . linagliptin  7.5 mg Oral Daily  . oseltamivir  30 mg Oral Daily  . sertraline  100 mg Oral QHS  . sodium chloride flush  3 mL Intravenous Q12H   Continuous Infusions: . sodium chloride       LOS: 3 days    Latausha Flamm Tanna Furry, MD [facility] Pager [phone]  If 7PM-7AM, please contact night-coverage [email or web address] Password [facility] 09/18/2016, 11:47 AM

## 2016-09-18 NOTE — Evaluation (Signed)
Physical Therapy Evaluation Patient Details Name: VEDH PTACEK, MD MRN: [id number] DOB: [birth year] Today's Date: 09/18/2016   History of Present Illness  Pt is a 83 y/o male admitted secondary to N/V with weakness, +flu. PMH including but not limited to dementia, CHF, CAD s/p CABG, CKD and HTN.  Clinical Impression  Pt presented supine in bed with HOB elevated, awake and willing to participate in therapy session. Pt has a history of cognitive deficits at baseline (dementia) and was not a reliable source of information regarding PLOF and home environment. A caregiver was present in the room; however, he reported that this was a new pt for him and that he had just met him earlier this morning and was unable to provide any information. Pt currently at mod I for bed mobility, min A for transfers and min guard for ambulation with use of RW. Pt would continue to benefit from skilled physical therapy services at this time while admitted and after d/c to address his below listed limitations in order to improve his overall safety and independence with functional mobility.      Follow Up Recommendations Home health PT;Supervision/Assistance - 24 hour    Equipment Recommendations  Rolling walker with 5" wheels;Other (comment) (pt may have one at home, but caregiver was not certain)    Recommendations for Other Services       Precautions / Restrictions Precautions Precautions: Fall Restrictions Weight Bearing Restrictions: No      Mobility  Bed Mobility Overal bed mobility: Modified Independent                Transfers Overall transfer level: Needs assistance Equipment used: Rolling walker (2 wheeled) Transfers: Sit to/from Stand Sit to Stand: Min assist         General transfer comment: increased time, min A to stabilize RW as pt pulling up on it with bilateral UEs to rise into standing. Min A also for stability with transition.  Ambulation/Gait Ambulation/Gait  assistance: Min guard Ambulation Distance (Feet): 20 Feet Assistive device: Rolling walker (2 wheeled) Gait Pattern/deviations: Step-through pattern;Decreased step length - right;Decreased step length - left;Decreased stride length Gait velocity: decreased   General Gait Details: pt required occasional VC'ing to maintain a safe distance from RW, mild instability but no LOB or need for physical assistance, min guard for safety  Stairs            Wheelchair Mobility    Modified Rankin (Stroke Patients Only)       Balance Overall balance assessment: Needs assistance Sitting-balance support: Feet supported Sitting balance-Leahy Scale: Good     Standing balance support: During functional activity;No upper extremity supported Standing balance-Leahy Scale: Fair                               Pertinent Vitals/Pain Pain Assessment: No/denies pain    Home Living Family/patient expects to be discharged to:: Private residence Living Arrangements: Spouse/significant other               Additional Comments: pt is very HOH and unable to give reliable history information secondary to dementia at baseline. Caregiver was present in room; however, stated that he is newly working with this pt and just met him this morning. No family members present.    Prior Function Level of Independence: Needs assistance   Gait / Transfers Assistance Needed: Min guard assist with RW     Comments: Pt is a  retired [employer] information [employer] manager [place]   Dominant Hand: Right    Extremity/Trunk Assessment   Upper Extremity Assessment Upper Extremity Assessment: Overall WFL for tasks assessed    Lower Extremity Assessment Lower Extremity Assessment: Overall WFL for tasks assessed       Communication   Communication: HOH  Cognition Arousal/Alertness: Awake/alert Behavior During Therapy: WFL for tasks assessed/performed Overall Cognitive Status: History of cognitive  impairments - at baseline Area of Impairment: Memory;Following commands;Safety/judgement;Awareness;Problem solving     Memory: Decreased short-term memory Following Commands: Follows one step commands consistently Safety/Judgement: Decreased awareness of safety;Decreased awareness of deficits Awareness: Intellectual Problem Solving: Decreased initiation;Requires verbal cues General Comments: pt with dementia at baseline    General Comments      Exercises     Assessment/Plan    PT Assessment Patient needs continued PT services  PT Problem List Decreased strength;Decreased activity tolerance;Decreased balance;Decreased mobility;Decreased coordination;Decreased cognition;Decreased safety awareness;Decreased knowledge of use of DME          PT Treatment Interventions DME instruction;Gait training;Stair training;Functional mobility training;Therapeutic activities;Therapeutic exercise;Balance training;Neuromuscular re-education;Cognitive remediation;Patient/family education    PT Goals (Current goals can be found in the Care Plan section)  Acute Rehab PT Goals Patient Stated Goal: "I want to sleep" PT Goal Formulation: With patient Time For Goal Achievement: 10/02/16 Potential to Achieve Goals: Good    Frequency Min 3X/week   Barriers to discharge        Co-evaluation               End of Session Equipment Utilized During Treatment: Gait belt Activity Tolerance: Patient tolerated treatment well Patient left: in bed;with call bell/phone within reach;with family/visitor present Nurse Communication: Mobility status         Time: 5697-9480 PT Time Calculation (min) (ACUTE ONLY): 20 min   Charges:   PT Evaluation $PT Eval Moderate Complexity: 1 Procedure     PT G CodesClearnce Sorrel Rosalin Buster 09/18/2016, 10:34 AM Sherie Don, PT, DPT [phone]

## 2016-09-19 NOTE — Progress Notes (Signed)
PROGRESS NOTE    [name], MD  [id number] DOB: 28-Nov-1922 DOA: 09/15/2016 PCP: [name], MD   Brief Narrative: 82 y.o. male with medical history significant for CAD status post CABG in 1191 and redo in 2006, chronic combined CHF, tachy-brady syndrome and pacemaker placement, history of paroxysmal a fib but not on anticoagulation due to prior bleeding episodes, dementia. He presented to [facility] with nausea, vomiting and weakness. In the ER patient was found to have trop level 0.21, BNP in 2700 range. CXR showed left base opacity. Assessment & Plan:  # Influenza A positive: -Continue droplet precaution -On Tamiflu 30 mg daily since 2/15 -Stable, continue supportive care  #Left lower lobe pneumonia: Chest x-ray consistent with left base opacity.  Patient is not hypoxic. -Continue ceftriaxone and azithromycin. Patient is clinically stable. -Continue DuoNeb nebulizer as needed -Follow up culture results. No growth so far. -Patient has elevated d-dimer with history of Afib. Pulmonary VQ scan low probability for PE. .  # Nausea vomiting likely in the setting of influenza. Continue supportive care.   Abdomen exam benign. -Patient's daughter was concerned about him holding food in the mouth and requesting swallow evaluation. Speech and swallow evaluation consult requested. Continue to provide supportive care. Patient needs assistance during feeding.  #Possible acute on chronic combined systolic and diastolic congestive heart failure: -Patient with elevated troponin level and BNP. Likely demand ischemia as per cardiologist. Echocardiogram reviewed which was consistent with reduced EF of 30-35% with wall motion abnormalities. Patient also with a grade 2 diastolic dysfunction.  -Recommended to continue conservative management and oral Lasix as per cardiologist. Monitor kidney function. Patient does not have fluid overload on physical exam. -Continue supportive care. -Patient does not  have chest pain. -Discussed with the palliative care team today. Evaluation ongoing. Follow-up with cardiologist further plan of care.  Today, I called patient's daughter Beryl Meager, M.D.) and discussed the plan of care. She clearly wants to have electrophysiologist who was referred by Dr. Tamala Julian to see the patient before discharge. I asked her if I can call the cardiologist covering this weekend and EP on call. She declined me to call them since they don't know patient well. She said, she will try to contact Dr. Tamala Julian personally. At this time, I will continue to monitor clinically. I also discussed with the patient's other daughter Franko Athas who is also a physician in [place].  #History of paroxysmal atrial fibrillation: Continue atenolol for rate control. Not on anticoagulation because of history of prior bleeding episode as per prior chart. Heart rate controlled  #Chronic kidney disease stage IV: Serum creatinine level is stable. Monitor BMP. Avoid nephrotoxins. Patient is nonoliguric.  #Type 2 diabetes: Currently on Linagliptin. Monitor blood sugar level.  #Essential hypertension: Blood pressure elevated this morning. Continue BiDil, atenolol, Lasix. Monitor blood pressure closely. Avoid drop in blood pressure.  #Anxiety depression: On Zoloft.  #Possible dementia without behavioral disturbance: Discontinue Aricept as per patient's daughter's request.  -Palliative care is following to discuss goals of care. PT, OT evaluation and treatment.   Principal Problem:   Left lower lobe pneumonia (HCC) Active Problems:   Pacemaker   Hyperlipidemia   Dementia   Nausea and vomiting   Essential hypertension   Troponin level elevated   Depression   Controlled type 2 diabetes mellitus with diabetic nephropathy, without long-term current use of insulin ([place])   CKD stage 4 due to type 2 diabetes mellitus ([place])   Influenza with pneumonia   Acute on  chronic combined systolic and diastolic  heart failure ([place])   Community acquired pneumonia of left lower lobe of lung ([place])   Palliative care encounter  DVT prophylaxis: SCD. Discontinue Lovenox as per patient's daughter request because patient has history of cerebral bleeding. Code Status: Full code Family Communication: Discussed with the patient's daughters over the phone. Patient caretaker at bedside. Disposition Plan: Likely discharge home in 1-2 days, pending cardiologist evaluation.  Consultants:   Cardiologist  Procedures: Echocardiogram Antimicrobials: Azithromycin, ceftriaxone and Tamiflu since 2/15  Subjective: Patient was seen and examined at bedside. No new event. Continues to feel good. Denied headache, dizziness, nausea, vomiting, chest pain or shortness of breath.    Objective: Vitals:   09/18/16 0527 09/18/16 1058 09/18/16 1946 09/19/16 0437  BP: (!) 146/71 (!) 155/81 (!) 146/72 (!) 160/73  Pulse: 64 65 81 72  Resp: 18 18 16 18   Temp: 98 F (36.7 C) 97.8 F (36.6 C) 98.4 F (36.9 C) 97.8 F (36.6 C)  TempSrc: Oral Oral Oral Oral  SpO2: 97% 99% 95% 97%  Weight: 61.9 kg (136 lb 6.4 oz)   62 kg (136 lb 9.6 oz)  Height:        Intake/Output Summary (Last 24 hours) at 09/19/16 0932 Last data filed at 09/19/16 0300  Gross per 24 hour  Intake           377.67 ml  Output              750 ml  Net          -372.33 ml   Filed Weights   09/17/16 0701 09/18/16 0527 09/19/16 0437  Weight: 62.8 kg (138 lb 6.4 oz) 61.9 kg (136 lb 6.4 oz) 62 kg (136 lb 9.6 oz)    Examination:  General exam: Elderly male lying on bed comfortable, not in distress Respiratory system: Clear bilateral, respiratory effort normal Cardiovascular system: Regular rate rhythm, S1-S2 normal. No pedal edema Gastrointestinal system: Abdomen soft, nontender, nondistended. Bowel sound positive Central nervous system: Alert awake and following commands.  Extremities: Symmetric 5 x 5 power. Skin: No rashes, lesions or  ulcers     Data Reviewed: I have personally reviewed following labs and imaging studies  CBC:  Recent Labs Lab 09/15/16 1320 09/17/16 0941 09/18/16 0516  WBC 4.9 4.1 4.4  HGB 12.6* 10.7* 10.8*  HCT 37.2* 31.8* 32.0*  MCV 89.2 88.3 88.6  PLT 212 191 [id number]   Basic Metabolic Panel:  Recent Labs Lab 09/15/16 1320 09/15/16 1645 09/16/16 0702 09/17/16 0941 09/18/16 0516  NA 143  --  140 140 142  K 4.0  --  3.4* 3.8 3.8  CL 105  --  106 107 108  CO2 26  --  25 24 24   GLUCOSE 211*  --  141* 211* 194*  BUN 47*  --  44* 42* 41*  CREATININE 2.89*  --  2.68* 2.91* 2.92*  CALCIUM 9.9  --  8.9 8.6* 8.7*  MG  --  2.1  --   --   --    GFR: Estimated Creatinine Clearance: 14.5 mL/min (by C-G formula based on SCr of 2.92 mg/dL (H)). Liver Function Tests:  Recent Labs Lab 09/15/16 1645  AST 34  ALT 32  ALKPHOS 98  BILITOT 0.4  PROT 6.2*  ALBUMIN 3.3*   No results for input(s): LIPASE, AMYLASE in the last 168 hours. No results for input(s): AMMONIA in the last 168 hours. Coagulation Profile: No results for input(s): INR, PROTIME  in the last 168 hours. Cardiac Enzymes:  Recent Labs Lab 09/15/16 1645 09/15/16 2043 09/16/16 0046 09/16/16 0702  TROPONINI 0.21* 0.23* 0.24* 0.34*   BNP (last 3 results) No results for input(s): PROBNP in the last 8760 hours. HbA1C: No results for input(s): HGBA1C in the last 72 hours. CBG: No results for input(s): GLUCAP in the last 168 hours. Lipid Profile: No results for input(s): CHOL, HDL, LDLCALC, TRIG, CHOLHDL, LDLDIRECT in the last 72 hours. Thyroid Function Tests: No results for input(s): TSH, T4TOTAL, FREET4, T3FREE, THYROIDAB in the last 72 hours. Anemia Panel: No results for input(s): VITAMINB12, FOLATE, FERRITIN, TIBC, IRON, RETICCTPCT in the last 72 hours. Sepsis Labs:  Recent Labs Lab 09/15/16 1730  LATICACIDVEN 1.63    Recent Results (from the past 240 hour(s))  Urine culture     Status: Abnormal   Collection  Time: 09/15/16  4:35 PM  Result Value Ref Range Status   Specimen Description URINE, RANDOM  Final   Special Requests NONE  Final   Culture MULTIPLE SPECIES PRESENT, SUGGEST RECOLLECTION (A)  Final   Report Status 09/17/2016 FINAL  Final  Blood culture (routine x 2)     Status: None (Preliminary result)   Collection Time: 09/15/16  4:45 PM  Result Value Ref Range Status   Specimen Description BLOOD RIGHT ANTECUBITAL  Final   Special Requests BOTTLES DRAWN AEROBIC ONLY 5CC  Final   Culture NO GROWTH 3 DAYS  Final   Report Status PENDING  Incomplete  Blood culture (routine x 2)     Status: None (Preliminary result)   Collection Time: 09/15/16  5:45 PM  Result Value Ref Range Status   Specimen Description BLOOD LEFT ANTECUBITAL  Final   Special Requests IN PEDIATRIC BOTTLE 3CC  Final   Culture NO GROWTH 3 DAYS  Final   Report Status PENDING  Incomplete  Respiratory Panel by PCR     Status: None   Collection Time: 09/15/16  6:22 PM  Result Value Ref Range Status   Adenovirus NOT DETECTED NOT DETECTED Final   Coronavirus 229E NOT DETECTED NOT DETECTED Final   Coronavirus HKU1 NOT DETECTED NOT DETECTED Final   Coronavirus NL63 NOT DETECTED NOT DETECTED Final   Coronavirus OC43 NOT DETECTED NOT DETECTED Final   Metapneumovirus NOT DETECTED NOT DETECTED Final   Rhinovirus / Enterovirus NOT DETECTED NOT DETECTED Final   Influenza A NOT DETECTED NOT DETECTED Final   Influenza B NOT DETECTED NOT DETECTED Final   Parainfluenza Virus 1 NOT DETECTED NOT DETECTED Final   Parainfluenza Virus 2 NOT DETECTED NOT DETECTED Final   Parainfluenza Virus 3 NOT DETECTED NOT DETECTED Final   Parainfluenza Virus 4 NOT DETECTED NOT DETECTED Final   Respiratory Syncytial Virus NOT DETECTED NOT DETECTED Final   Bordetella pertussis NOT DETECTED NOT DETECTED Final   Chlamydophila pneumoniae NOT DETECTED NOT DETECTED Final   Mycoplasma pneumoniae NOT DETECTED NOT DETECTED Final         Radiology  Studies: No results found.      Scheduled Meds: . atenolol  50 mg Oral BID  . azithromycin  500 mg Oral Daily  . buPROPion  100 mg Oral BID  . cefTRIAXone (ROCEPHIN)  IV  1 g Intravenous Q24H  . ferrous sulfate  325 mg Oral Q breakfast  . furosemide  20 mg Oral Daily  . isosorbide-hydrALAZINE  1 tablet Oral TID  . linagliptin  7.5 mg Oral Daily  . oseltamivir  30 mg Oral Daily  .  sertraline  100 mg Oral QHS  . sodium chloride flush  3 mL Intravenous Q12H   Continuous Infusions: . sodium chloride 10 mL/hr (09/18/16 1714)     LOS: 4 days    Katura Eatherly Tanna Furry, MD [facility] Pager [phone]  If 7PM-7AM, please contact night-coverage [email or web address] Password [facility] 09/19/2016, 9:32 AM

## 2016-09-19 NOTE — Progress Notes (Signed)
Pt stable, Vitals stable, no any sign of SOB and distress noted, no any complain of pain, will continue to monitor the patient.

## 2016-09-19 NOTE — Evaluation (Signed)
Clinical/Bedside Swallow Evaluation Patient Details  Name: Paul NARA, MD MRN: [id number] Date of Birth: [birth year]  Today's Date: 09/19/2016 Time: SLP Start Time (ACUTE ONLY): 48 SLP Stop Time (ACUTE ONLY): 1414 SLP Time Calculation (min) (ACUTE ONLY): 12 min  Past Medical History:  Past Medical History:  Diagnosis Date  . CAD (coronary artery disease)    a. CABG 1991 and 2006 [place] LAD, seq. SVG D1-Cfx, SVG d2,and seq. SVG AM-PDA   . Chronic systolic CHF (congestive heart failure) ([place])   . CKD (chronic kidney disease)   . Dementia   . Depression   . Diabetes mellitus   . HTN (hypertension)   . Left leg DVT ([place])   . Prostate cancer ([place])   . Tachycardia-bradycardia syndrome [facility])    Past Surgical History:  Past Surgical History:  Procedure Laterality Date  . CORONARY ARTERY BYPASS GRAFT    . PROSTATECTOMY     HPI:  83 y.o.malewith medical history significant for CAD status post CABG in 1191 and redo in 2006, chronic combined CHF, tachy-brady syndrome and pacemaker placement, history of paroxysmal a fib but not on anticoagulation due to prior bleeding episodes, dementia. He presented to [facility] with nausea, vomiting and weakness. CXR showed left base opacity. Pt admitted with influenza A, left lower lobe pneumonia, possible acute on chronic combined systolic and diastolic CHF. Pt's daughter expressed concern for patient holding food in the mouth and swallow evaluation was ordered. No prior swallowing evaluations per chart.   Assessment / Plan / Recommendation Clinical Impression  Patient presents with oropharyngeal swallow which appears within functional limits with adequate airway protection at bedside. Patient is alert, cooperative and follows basic commands. No overt signs of aspiration observed. Per caregiver and MD notes, patient's daughter with concerns for  bolus holding. Oral manipulation appears adequate, without prolonged holding. Patient intermittently holds and  manuevers thin liquids in the oral cavity following presentation of solids in what appears to be a functional effort to clear mild residual. Recommend regular diet with thin liquids, medications whole with liquid. Provided education to caregiver re: dementia and common sensory deficits which can contribute to oral holding. While this behavior was not observed during the evaluation, and swallowing appears functional, patient may benefit from [place] follow-up should patient's daughter continue to express concerns. SLP to s/o.     Aspiration Risk  Mild aspiration risk    Diet Recommendation Regular;Thin liquid   Medication Administration: Whole meds with liquid Supervision: Patient able to self feed Compensations: Slow rate;Small sips/bites;Minimize environmental distractions    Other  Recommendations Oral Care Recommendations: Oral care BID   Follow up Recommendations Home health SLP      Frequency and Duration            Prognosis Prognosis for Safe Diet Advancement: Good Barriers to Reach Goals: Cognitive deficits      Swallow Study   General Date of Onset: 09/15/16 HPI: 83 y.o.malewith medical history significant for CAD status post CABG in 1191 and redo in 2006, chronic combined CHF, tachy-brady syndrome and pacemaker placement, history of paroxysmal a fib but not on anticoagulation due to prior bleeding episodes, dementia. He presented to [facility] with nausea, vomiting and weakness. CXR showed left base opacity. Pt admitted with influenza A, left lower lobe pneumonia, possible acute on chronic combined systolic and diastolic CHF. Pt's daughter expressed concern for patient holding food in the mouth and swallow evaluation was ordered. No prior swallowing evaluations per chart. Type of Study: Bedside Swallow  Evaluation Previous Swallow Assessment: no prior swallowing evaluations per chart Diet Prior to this Study: Regular;Thin liquids Temperature Spikes Noted: No Respiratory Status: Room  air History of Recent Intubation: No Behavior/Cognition: Alert;Cooperative;Pleasant mood Oral Cavity Assessment: Within Functional Limits Oral Care Completed by SLP: No Oral Cavity - Dentition: Adequate natural dentition Vision: Functional for self-feeding Self-Feeding Abilities: Able to feed self Patient Positioning: Upright in bed Baseline Vocal Quality: Normal Volitional Cough: Strong Volitional Swallow: Able to elicit    Oral/Motor/Sensory Function Overall Oral Motor/Sensory Function: Within functional limits   Ice Chips Ice chips: Within functional limits   Thin Liquid Thin Liquid: Within functional limits Presentation: Cup;Straw    Nectar Thick Nectar Thick Liquid: Not tested   Honey Thick Honey Thick Liquid: Not tested   Puree Puree: Within functional limits Presentation: Self Fed;Spoon   Solid   GO   Solid: Within functional limits Presentation: Altha, [place] CF-SLP Speech-Language Pathologist [phone] Aliene Altes 09/19/2016,2:34 PM

## 2016-09-20 LAB — CULTURE, BLOOD (ROUTINE X 2)
CULTURE: NO GROWTH
Culture: NO GROWTH

## 2016-09-20 MED ORDER — AMLODIPINE BESYLATE 5 MG PO TABS
5.0000 mg | ORAL_TABLET | Freq: Every day | ORAL | Status: DC
  Filled 2016-09-20: qty 1

## 2016-09-20 MED ORDER — AMLODIPINE BESYLATE 5 MG PO TABS: 5.0000 mg | ORAL_TABLET | Freq: Every day | ORAL | 0 refills | Status: DC

## 2016-09-20 NOTE — Progress Notes (Signed)
Daughter of pt refuses to allow pt to take newly ordered norvasc, as she states it makes him "cough."  Physician sticky note completed.

## 2016-09-20 NOTE — Progress Notes (Signed)
Orders received for pt discharge.  Discharge summary printed and reviewed with daughter.  Explained medication regimen, and daughter had no further questions at this time.  IV removed and site remains clean, dry, intact.  Telemetry removed.  Pt in stable condition and awaiting transport.

## 2016-09-20 NOTE — Discharge Summary (Signed)
Physician Discharge Summary  [name], MD QI:[id number] DOB: 1923-02-13 DOA: 09/15/2016  PCP: [name], MD  Admit date: 09/15/2016 Discharge date: 09/20/2016  Admitted From:Home Disposition:home with home care  Recommendations for Outpatient Follow-up:  1. Follow up with PCP in 1-2 weeks 2. Please obtain BMP/CBC in one week 3. Please monitor blood pressure and blood sugar level at home and follow up with PCP.   Home Health: Yes Equipment/Devices: No Discharge Condition: Stable CODE STATUS: Full code Diet recommendation: Heart healthy  Brief/Interim Summary:83 y.o.malewith medical history significant for CAD status post CABG in 1191 and redo in 2006, chronic combined CHF, tachy-brady syndrome and pacemaker placement, history of paroxysmal a fib but not on anticoagulation due to prior bleeding episodes, dementia. He presented to [facility] with nausea, vomiting and weakness. In the ER patient was found to have trop level 0.21, BNP in 2700 range. CXR showed left base opacity.  # Influenza A positive: -The patient completed a course of Tamiflu. He is clinically improved. Continue supportive care.  #Left lower lobe pneumonia: Chest x-ray consistent with left base opacity.  -the patient was treated with azithromycin and ceftriaxone. He completed 5 days course. He is clinically improved. He has no fever, no hypoxia, no leukocytosis and cultures negative. I reviewed this with the patient's daughter who is a physician at bedside in detail. Recommended to follow up with PCP. -Patient had elevated d-dimer with history of Afib. Pulmonary VQ scan low probability for PE. .  # Nausea vomiting likely in the setting of influenza. Clinically improved. Able to tolerate diet. Abdomen exam benign.  #Possible acute on chronic combined systolic and diastolic congestive heart failure: -Patient with elevated troponin level and BNP. Likely demand ischemia as per cardiologist. Echocardiogram reviewed  which was consistent with reduced EF of 30-35% with wall motion abnormalities. Patient also with a grade 2 diastolic dysfunction.  -Recommended to continue conservative management and oral Lasix as per cardiologist. Monitor kidney function. Patient does not have fluid overload on physical exam. -Evaluated by electrophysiologist this morning. I discussed with Dr.Camnitz today. He recommended no further workup and outpatient follow-up. Patient is clinically stable. I discussed this with the patient's daughter who reported that she already discussed with Dr. Tamala Julian from cardiologist.   #History of paroxysmal atrial fibrillation: Continue atenolol for rate control. Not on anticoagulation because of history of prior bleeding episode as per prior chart.   #Chronic kidney disease stage IV: Serum creatinine level is stable. Recommended to follow-up with PCP and repeat lab as an outpatient.  #Type 2 diabetes: No change in home medications for now. I recommended to monitor blood sugar level at home and it's just dose of insulin. Also recommended to hold insulin if blood sugar less than 100. Advised to follow-up with PCP.  #Essential hypertension: Patient with suboptimal control of blood pressure. I added Norvasc. This morning the patient and family declined to take the new medicine however, after I discussed with the patient and her daughter they agreed to take amlodipine. As per patient's daughter, patient has a cough with ACE inhibitor. I explained that amlodipine is not ACE inhibitor. Apparently patient used to take amlodipine in the past and had no problem as per patient's daughter, which is a physician (MD). Continue BiDil, atenolol, Lasix. Recommended to monitor blood pressure at home.  #Anxiety depression: On Zoloft.  #Possible dementia without behavioral disturbance: Continue home medication and supportive care. Patient was evaluated by palliative care, PT and OT. I ordered home care services on  discharge. I discussed with the patient's daughter regarding home care, physical therapy and close outpatient follow-up.  The patient is clinically stable. He is very happy to go home today. Recommended outpatient follow-up.  Discharge Diagnoses:  Principal Problem:   Left lower lobe pneumonia ([place]) Active Problems:   Pacemaker   Hyperlipidemia   Dementia   Nausea and vomiting   Essential hypertension   Troponin level elevated   Depression   Controlled type 2 diabetes mellitus with diabetic nephropathy, without long-term current use of insulin (HCC)   CKD stage 4 due to type 2 diabetes mellitus (HCC)   Influenza with pneumonia   Acute on chronic combined systolic and diastolic heart failure ([place])   Community acquired pneumonia of left lower lobe of lung ([place])   Palliative care encounter    Discharge Instructions  Discharge Instructions    (HEART FAILURE PATIENTS) Call MD:  Anytime you have any of the following symptoms: 1) 3 pound weight gain in 24 hours or 5 pounds in 1 week 2) shortness of breath, with or without a dry hacking cough 3) swelling in the hands, feet or stomach 4) if you have to sleep on extra pillows at night in order to breathe.    Complete by:  As directed    Call MD for:  difficulty breathing, headache or visual disturbances    Complete by:  As directed    Call MD for:  extreme fatigue    Complete by:  As directed    Call MD for:  hives    Complete by:  As directed    Call MD for:  persistant dizziness or light-headedness    Complete by:  As directed    Call MD for:  persistant nausea and vomiting    Complete by:  As directed    Call MD for:  redness, tenderness, or signs of infection (pain, swelling, redness, odor or green/yellow discharge around incision site)    Complete by:  As directed    Call MD for:  severe uncontrolled pain    Complete by:  As directed    Call MD for:  temperature >100.4    Complete by:  As directed    Diet - low sodium heart  healthy    Complete by:  As directed    Discharge instructions    Complete by:  As directed    Please monitor blood sugar at home and follow up with your PCP. Please don't take insulin if your blood sugar is lower than 100 and discuss with PCP Please check BP at home.   Increase activity slowly    Complete by:  As directed      Allergies as of 09/20/2016      Reactions   Iodinated Diagnostic Agents Anaphylaxis   Iohexol Anaphylaxis    Desc: RN states pt stated he had anyphalactic shock reaction to contrast approx 10 years ago.   Ace Inhibitors Other (See Comments)   cough   Simvastatin Other (See Comments)   Muscle aches   Aspirin    Other reaction(s): Abdominal Pain      Medication List    STOP taking these medications   ondansetron 4 MG tablet Commonly known as:  ZOFRAN     TAKE these medications   ALIGN PO Take 1 capsule by mouth daily.   amLODipine 5 MG tablet Commonly known as:  NORVASC Take 1 tablet (5 mg total) by mouth daily. Start taking on:  09/21/2016   atenolol  50 MG tablet Commonly known as:  TENORMIN Take 50 mg by mouth 2 (two) times daily.   buPROPion 100 MG tablet Commonly known as:  WELLBUTRIN Take 1 tablet by mouth 2 (two) times daily.   CALCIUM PO Take 1 tablet by mouth 2 (two) times daily.   COQ10 PO Take 1 capsule by mouth daily.   DOCQLACE PO Take 2 capsules by mouth 2 (two) times daily.   donepezil 5 MG tablet Commonly known as:  ARICEPT Take 5 mg by mouth at bedtime.   feeding supplement (GLUCERNA SHAKE) Liqd Take 237 mLs by mouth 3 (three) times daily as needed (feeding supplement).   ferrous sulfate 325 (65 FE) MG tablet Take 1 tablet (325 mg total) by mouth daily with breakfast.   FREESTYLE LITE TEST VI USE AS DIRECTED   furosemide 20 MG tablet Commonly known as:  LASIX Take 1 tablet (20 mg total) by mouth daily.   ICAPS PO Take 1 tablet by mouth 2 (two) times daily.   Insulin Detemir 100 UNIT/ML Pen Commonly known  as:  LEVEMIR Inject 8 Units into the skin daily as needed. For blood glucose per sliding scale.   isosorbide-hydrALAZINE 20-37.5 MG tablet Commonly known as:  BIDIL Take 1 tablet by mouth 3 (three) times daily.   linagliptin 5 MG Tabs tablet Commonly known as:  TRADJENTA Take 7.5 mg by mouth daily.   polyethylene glycol packet Commonly known as:  MIRALAX / GLYCOLAX Take 17 g by mouth daily as needed for moderate constipation.   ramelteon 8 MG tablet Commonly known as:  ROZEREM Take 8 mg by mouth at bedtime as needed for sleep.   SENNA PO Take 1 tablet by mouth daily.   sertraline 50 MG tablet Commonly known as:  ZOLOFT Take 100 mg by mouth at bedtime.      Follow-up Information    [name], MD. Schedule an appointment as soon as possible for a visit in 1 week(s).   Specialty:  Family Medicine Contact information: [address] [place] [address] [phone]        Henry W Smith III, MD. Schedule an appointment as soon as possible for a visit in 2 week(s).   Specialty:  Cardiology Contact information: [id number] [place] [address] [phone]          Allergies  Allergen Reactions  . Iodinated Diagnostic Agents Anaphylaxis  . Iohexol Anaphylaxis     Desc: RN states pt stated he had anyphalactic shock reaction to contrast approx 10 years ago.   . Ace Inhibitors Other (See Comments)    cough  . Simvastatin Other (See Comments)    Muscle aches  . Aspirin     Other reaction(s): Abdominal Pain    Consultations: Cardiologist  Procedures/Studies: None  Subjective: Patient was seen and examined today. Patient reported feeling good and very eager to go home today. Denied fever, chills, headache, dizziness, nausea, vomiting, chest pain or shortness of breath. He feels good.  Discharge Exam: Vitals:   09/20/16 0546 09/20/16 0645  BP: (!) 171/80 (!) 161/91  Pulse: 63 70  Resp: 18   Temp: 98.2 F (36.8  C)    Vitals:   09/19/16 1240 09/19/16 1925 09/20/16 0546 09/20/16 0645  BP: (!) 159/75 (!) 148/86 (!) 171/80 (!) 161/91  Pulse: 65 88 63 70  Resp: 18 18 18    Temp: 97.9 F (36.6 C) 98.2 F (36.8 C) 98.2 F (36.8 C)  TempSrc: Oral Oral Oral   SpO2: 96% 100% 100%   Weight:   62.5 kg (137 lb 12.8 oz)   Height:        General: Pleasant elderly male lying on bed comfortable, not in distress  Cardiovascular: RRR, S1/S2 +, no rubs, no gallops Respiratory: CTA bilaterally, no wheezing, no rhonchi Abdominal: Soft, NT, ND, bowel sounds + Extremities: no edema, no cyanosis Neurologic: Alert, awake, following commands. No focal neurological deficit.   The results of significant diagnostics from this hospitalization (including imaging, microbiology, ancillary and laboratory) are listed below for reference.     Microbiology: Recent Results (from the past 240 hour(s))  Urine culture     Status: Abnormal   Collection Time: 09/15/16  4:35 PM  Result Value Ref Range Status   Specimen Description URINE, RANDOM  Final   Special Requests NONE  Final   Culture MULTIPLE SPECIES PRESENT, SUGGEST RECOLLECTION (A)  Final   Report Status 09/17/2016 FINAL  Final  Blood culture (routine x 2)     Status: None (Preliminary result)   Collection Time: 09/15/16  4:45 PM  Result Value Ref Range Status   Specimen Description BLOOD RIGHT ANTECUBITAL  Final   Special Requests BOTTLES DRAWN AEROBIC ONLY 5CC  Final   Culture NO GROWTH 4 DAYS  Final   Report Status PENDING  Incomplete  Blood culture (routine x 2)     Status: None (Preliminary result)   Collection Time: 09/15/16  5:45 PM  Result Value Ref Range Status   Specimen Description BLOOD LEFT ANTECUBITAL  Final   Special Requests IN PEDIATRIC BOTTLE 3CC  Final   Culture NO GROWTH 4 DAYS  Final   Report Status PENDING  Incomplete  Respiratory Panel by PCR     Status: None   Collection Time: 09/15/16  6:22 PM  Result Value Ref Range Status    Adenovirus NOT DETECTED NOT DETECTED Final   Coronavirus 229E NOT DETECTED NOT DETECTED Final   Coronavirus HKU1 NOT DETECTED NOT DETECTED Final   Coronavirus NL63 NOT DETECTED NOT DETECTED Final   Coronavirus OC43 NOT DETECTED NOT DETECTED Final   Metapneumovirus NOT DETECTED NOT DETECTED Final   Rhinovirus / Enterovirus NOT DETECTED NOT DETECTED Final   Influenza A NOT DETECTED NOT DETECTED Final   Influenza B NOT DETECTED NOT DETECTED Final   Parainfluenza Virus 1 NOT DETECTED NOT DETECTED Final   Parainfluenza Virus 2 NOT DETECTED NOT DETECTED Final   Parainfluenza Virus 3 NOT DETECTED NOT DETECTED Final   Parainfluenza Virus 4 NOT DETECTED NOT DETECTED Final   Respiratory Syncytial Virus NOT DETECTED NOT DETECTED Final   Bordetella pertussis NOT DETECTED NOT DETECTED Final   Chlamydophila pneumoniae NOT DETECTED NOT DETECTED Final   Mycoplasma pneumoniae NOT DETECTED NOT DETECTED Final     Labs: BNP (last 3 results)  Recent Labs  11/12/15 1103 09/15/16 1645  BNP 3,358.8* [id number]*   Basic Metabolic Panel:  Recent Labs Lab 09/15/16 1320 09/15/16 1645 09/16/16 0702 09/17/16 0941 09/18/16 0516  NA 143  --  140 140 142  K 4.0  --  3.4* 3.8 3.8  CL 105  --  106 107 108  CO2 26  --  25 24 24   GLUCOSE 211*  --  141* 211* 194*  BUN 47*  --  44* 42* 41*  CREATININE 2.89*  --  2.68* 2.91* 2.92*  CALCIUM 9.9  --  8.9 8.6* 8.7*  MG  --  2.1  --   --   --  Liver Function Tests:  Recent Labs Lab 09/15/16 1645  AST 34  ALT 32  ALKPHOS 98  BILITOT 0.4  PROT 6.2*  ALBUMIN 3.3*   No results for input(s): LIPASE, AMYLASE in the last 168 hours. No results for input(s): AMMONIA in the last 168 hours. CBC:  Recent Labs Lab 09/15/16 1320 09/17/16 0941 09/18/16 0516  WBC 4.9 4.1 4.4  HGB 12.6* 10.7* 10.8*  HCT 37.2* 31.8* 32.0*  MCV 89.2 88.3 88.6  PLT 212 191 171   Cardiac Enzymes:  Recent Labs Lab 09/15/16 1645 09/15/16 2043 09/16/16 0046 09/16/16 0702   TROPONINI 0.21* 0.23* 0.24* 0.34*   BNP: Invalid input(s): POCBNP CBG: No results for input(s): GLUCAP in the last 168 hours. D-Dimer No results for input(s): DDIMER in the last 72 hours. Hgb A1c No results for input(s): HGBA1C in the last 72 hours. Lipid Profile No results for input(s): CHOL, HDL, LDLCALC, TRIG, CHOLHDL, LDLDIRECT in the last 72 hours. Thyroid function studies No results for input(s): TSH, T4TOTAL, T3FREE, THYROIDAB in the last 72 hours.  Invalid input(s): FREET3 Anemia work up No results for input(s): VITAMINB12, FOLATE, FERRITIN, TIBC, IRON, RETICCTPCT in the last 72 hours. Urinalysis    Component Value Date/Time   COLORURINE YELLOW 09/15/2016 1930   APPEARANCEUR HAZY (A) 09/15/2016 1930   LABSPEC 1.011 09/15/2016 1930   PHURINE 6.0 09/15/2016 1930   GLUCOSEU 50 (A) 09/15/2016 1930   HGBUR NEGATIVE 09/15/2016 1930   BILIRUBINUR NEGATIVE 09/15/2016 1930   KETONESUR NEGATIVE 09/15/2016 1930   PROTEINUR 30 (A) 09/15/2016 1930   UROBILINOGEN 1.0 09/23/2009 0124   NITRITE NEGATIVE 09/15/2016 1930   LEUKOCYTESUR NEGATIVE 09/15/2016 1930   Sepsis Labs Invalid input(s): PROCALCITONIN,  WBC,  LACTICIDVEN Microbiology Recent Results (from the past 240 hour(s))  Urine culture     Status: Abnormal   Collection Time: 09/15/16  4:35 PM  Result Value Ref Range Status   Specimen Description URINE, RANDOM  Final   Special Requests NONE  Final   Culture MULTIPLE SPECIES PRESENT, SUGGEST RECOLLECTION (A)  Final   Report Status 09/17/2016 FINAL  Final  Blood culture (routine x 2)     Status: None (Preliminary result)   Collection Time: 09/15/16  4:45 PM  Result Value Ref Range Status   Specimen Description BLOOD RIGHT ANTECUBITAL  Final   Special Requests BOTTLES DRAWN AEROBIC ONLY 5CC  Final   Culture NO GROWTH 4 DAYS  Final   Report Status PENDING  Incomplete  Blood culture (routine x 2)     Status: None (Preliminary result)   Collection Time: 09/15/16  5:45  PM  Result Value Ref Range Status   Specimen Description BLOOD LEFT ANTECUBITAL  Final   Special Requests IN PEDIATRIC BOTTLE 3CC  Final   Culture NO GROWTH 4 DAYS  Final   Report Status PENDING  Incomplete  Respiratory Panel by PCR     Status: None   Collection Time: 09/15/16  6:22 PM  Result Value Ref Range Status   Adenovirus NOT DETECTED NOT DETECTED Final   Coronavirus 229E NOT DETECTED NOT DETECTED Final   Coronavirus HKU1 NOT DETECTED NOT DETECTED Final   Coronavirus NL63 NOT DETECTED NOT DETECTED Final   Coronavirus OC43 NOT DETECTED NOT DETECTED Final   Metapneumovirus NOT DETECTED NOT DETECTED Final   Rhinovirus / Enterovirus NOT DETECTED NOT DETECTED Final   Influenza A NOT DETECTED NOT DETECTED Final   Influenza B NOT DETECTED NOT DETECTED Final   Parainfluenza Virus 1  NOT DETECTED NOT DETECTED Final   Parainfluenza Virus 2 NOT DETECTED NOT DETECTED Final   Parainfluenza Virus 3 NOT DETECTED NOT DETECTED Final   Parainfluenza Virus 4 NOT DETECTED NOT DETECTED Final   Respiratory Syncytial Virus NOT DETECTED NOT DETECTED Final   Bordetella pertussis NOT DETECTED NOT DETECTED Final   Chlamydophila pneumoniae NOT DETECTED NOT DETECTED Final   Mycoplasma pneumoniae NOT DETECTED NOT DETECTED Final     Time coordinating discharge: 35 minutes  SIGNED:   Rosita Fire, MD  [facility] 09/20/2016, 12:14 PM  If 7PM-7AM, please contact night-coverage [email or web address] Password [email or web address]

## 2016-09-20 NOTE — Progress Notes (Signed)
Physical Therapy Treatment Patient Details Name: SAMY MCGRANN, MD MRN: [id number] DOB: 03/03/1923 Today's Date: 09/20/2016    History of Present Illness Pt is a 83 y/o male admitted secondary to N/V with weakness, +flu. PMH including but not limited to dementia, CHF, CAD s/p CABG, CKD and HTN.    PT Comments    Excellent progress with mobility. Pt able to ambulate household distance of 150 feet with RW and min guard assist.   Follow Up Recommendations  Home health PT;Supervision/Assistance - 24 hour     Equipment Recommendations  None recommended by PT    Recommendations for Other Services       Precautions / Restrictions Precautions Precautions: Fall    Mobility  Bed Mobility Overal bed mobility: Needs Assistance Bed Mobility: Supine to Sit     Supine to sit: Min guard     General bed mobility comments: Pt received in recliner.  Transfers Overall transfer level: Needs assistance Equipment used: Rolling walker (2 wheeled) Transfers: Sit to/from Stand Sit to Stand: Min assist         General transfer comment: verbal cues for hand placement. Pt prefers to pull up on RW.  Ambulation/Gait Ambulation/Gait assistance: Min guard Ambulation Distance (Feet): 150 Feet Assistive device: Rolling walker (2 wheeled) Gait Pattern/deviations: Step-through pattern;Shuffle;Decreased stride length;Trunk flexed Gait velocity: decreased   General Gait Details: Verbal cues to stay close to RW. He tends to push RW too far in front as he fatigues. SpO2 98% on RA during mobility.   Stairs            Wheelchair Mobility    Modified Rankin (Stroke Patients Only)       Balance   Sitting-balance support: Feet supported;No upper extremity supported Sitting balance-Leahy Scale: Good     Standing balance support: During functional activity;Bilateral upper extremity supported Standing balance-Leahy Scale: Poor                      Cognition  Arousal/Alertness: Awake/alert Behavior During Therapy: WFL for tasks assessed/performed Overall Cognitive Status: History of cognitive impairments - at baseline                      Exercises      General Comments        Pertinent Vitals/Pain Pain Assessment: Faces Pain Score: 0-No pain Faces Pain Scale: No hurt    Home Living Family/patient expects to be discharged to:: Private residence Living Arrangements: Spouse/significant other Available Help at Discharge: Family;Available 24 hours/day;Personal care attendant Type of Home: House Home Access: Stairs to enter Entrance Stairs-Rails: Right Home Layout: Two level;Able to live on main level with bedroom/bathroom Home Equipment: Kasandra Knudsen - single point;Shower seat;Walker - 2 wheels;Grab bars - tub/shower;Toilet riser (transport chair) Additional Comments: pt is very HOH. Caregiver present and relayed information that pt has caregivers dialy from outside company.    Prior Function Level of Independence: Needs assistance  Gait / Transfers Assistance Needed: mod I per family ADL's / Homemaking Assistance Needed: family assists with ADL tasks     PT Goals (current goals can now be found in the care plan section) Acute Rehab PT Goals Patient Stated Goal: none stated PT Goal Formulation: With patient Time For Goal Achievement: 10/02/16 Potential to Achieve Goals: Good Progress towards PT goals: Progressing toward goals    Frequency    Min 3X/week      PT Plan Current plan remains appropriate    Co-evaluation  End of Session Equipment Utilized During Treatment: Gait belt Activity Tolerance: Patient tolerated treatment well Patient left: in chair;with call bell/phone within reach;with family/visitor present Nurse Communication: Mobility status PT Visit Diagnosis: Difficulty in walking, not elsewhere classified (R26.2)     Time: 1010-1034 PT Time Calculation (min) (ACUTE ONLY): 24  min  Charges:  $Gait Training: 23-37 mins                    G Codes:       Lorriane Shire 09/20/2016, 10:59 AM

## 2016-09-20 NOTE — Progress Notes (Signed)
Inpatient Diabetes Program Recommendations  AACE/ADA: New Consensus Statement on Inpatient Glycemic Control (2015)  Target Ranges:  Prepandial:   less than 140 mg/dL      Peak postprandial:   less than 180 mg/dL (1-2 hours)      Critically ill patients:  140 - 180 mg/dL   Lab Results  Component Value Date   GLUCAP 166 (H) 02/10/2016   HGBA1C 7.4 (H) 09/16/2016   Review of Glycemic Control  Diabetes history: DM 2 Outpatient Diabetes mdications: Tradjenta 7.5 Daily, Levemir 8 units Daily Current orders for Inpatient glycemic control: Tradjenta 5 mg Daily  A1c 7.4% on 09/17/15  Inpatient Diabetes Program Recommendations:   Please order CBGs and Novolog Sensitive Correction TID while inpatient. Patient may also need a portion of home Levemir.  Thanks,  Tama Headings RN, MSN, [facility] Inpatient Diabetes Coordinator Team Pager [phone] (8a-5p)

## 2016-09-20 NOTE — Progress Notes (Addendum)
Occupational Therapy Evaluation Patient Details Name: Paul KICHLINE, MD MRN: [id number] DOB: Apr 22, 1923 Today's Date: 09/20/2016    History of Present Illness Pt is a 83 y/o male admitted secondary to N/V with weakness, +flu. PMH including but not limited to dementia, CHF, CAD s/p CABG, CKD and HTN.   Clinical Impression   PTA, pt had private caregivers who assisted with ADL as needed. Pt ambulated with RW @ mod I level. Pt currently requires min A for mobility and is a fall risk. Completed education with caregivers regarding recommendations to reduce risk of falls and reduce burden of caregiver assistance during ADL. Caregivers verbalized understanding. Pt safe to DC home with 24/7 assistance with mobility and ADL. OT signing off.     Follow Up Recommendations  No OT follow up;Supervision/Assistance - 24 hour    Equipment Recommendations  None recommended by OT    Recommendations for Other Services       Precautions / Restrictions Precautions Precautions: Fall      Mobility Bed Mobility Overal bed mobility: Needs Assistance Bed Mobility: Supine to Sit     Supine to sit: Min guard     General bed mobility comments: posterior bias  Transfers Overall transfer level: Needs assistance Equipment used: Rolling walker (2 wheeled) Transfers: Sit to/from Stand Sit to Stand: Min assist         General transfer comment: pt pulls up on RW. Most likely baseline    Balance   Sitting-balance support: Feet supported Sitting balance-Leahy Scale: Fair (posterior bais)     Standing balance support: Bilateral upper extremity supported;During functional activity Standing balance-Leahy Scale: Poor                              ADL Overall ADL's : Needs assistance/impaired Eating/Feeding: Set up                                   Functional mobility during ADLs: Minimal assistance;Rolling walker;Cueing for safety General ADL Comments: caregivers  assist wtih ADL tasks. Educated caregiver on recommendation to complete sponge baths until  pt returns to his baseline strengh. Also recommend caregivers use shwoerseat adn hand held shower. Caregiver verbalized understanding.      Vision         Perception     Praxis      Pertinent Vitals/Pain Pain Assessment: Faces Pain Score: 0-No pain     Hand Dominance Right   Extremity/Trunk Assessment Upper Extremity Assessment Upper Extremity Assessment: Generalized weakness   Lower Extremity Assessment Lower Extremity Assessment: Generalized weakness   Cervical / Trunk Assessment Cervical / Trunk Assessment: Normal   Communication Communication Communication: HOH   Cognition Arousal/Alertness: Awake/alert Behavior During Therapy: WFL for tasks assessed/performed Overall Cognitive Status: History of cognitive impairments - at baseline                     General Comments       Exercises       Shoulder Instructions      Home Living Family/patient expects to be discharged to:: Private residence Living Arrangements: Spouse/significant other Available Help at Discharge: Family;Available 24 hours/day;Personal care attendant Type of Home: House Home Access: Stairs to enter [employer] of Steps: 4 Entrance Stairs-Rails: Right Home Layout: Two level;Able to live on main level with bedroom/bathroom     Bathroom Shower/Tub: Walk-in  shower   Bathroom Toilet: Standard     Home Equipment: Cane - single point;Shower seat;Walker - 2 wheels;Grab bars - tub/shower;Toilet riser (transport chair)   Additional Comments: pt is very HOH. Caregiver present and relayed information that pt has caregivers dialy from outside company.      Prior Functioning/Environment Level of Independence: Needs assistance  Gait / Transfers Assistance Needed: mod I per family ADL's / Homemaking Assistance Needed: family assists with ADL tasks Communication / Swallowing Assistance  Needed: holds bolus at times. passed swallow eval.          OT Problem List: Decreased activity tolerance;Decreased strength;Decreased cognition;Decreased safety awareness;Decreased knowledge of use of DME or AE      OT Treatment/Interventions:      OT Goals(Current goals can be found in the care plan section) Acute Rehab OT Goals Patient Stated Goal: none stated OT Goal Formulation: All assessment and education complete, DC therapy  OT Frequency:     Barriers to D/C:            Co-evaluation              End of Session Equipment Utilized During Treatment: Rolling walker Nurse Communication: Mobility status  Activity Tolerance: Patient tolerated treatment well Patient left: in chair;with call bell/phone within reach;with family/visitor present (private sitter in room)  OT Visit Diagnosis: Unsteadiness on feet (R26.81)                ADL either performed or assessed with clinical judgement  Time: 0939-1000 OT Time Calculation (min): 21 min Charges:  OT General Charges $OT Visit: 1 Procedure OT Evaluation $OT Eval Low Complexity: 1 Procedure G-CodesMyrene Buddy Elexa Kivi, OT/L  [id number] 09/20/2016 09/20/2016, 10:24 AM

## 2016-09-30 ENCOUNTER — Ambulatory Visit: Payer: Self-pay | Admitting: Physician Assistant

## 2016-12-15 ENCOUNTER — Ambulatory Visit (INDEPENDENT_AMBULATORY_CARE_PROVIDER_SITE_OTHER): Payer: Medicare Other | Admitting: *Deleted

## 2016-12-15 DIAGNOSIS — I480 Paroxysmal atrial fibrillation: Secondary | ICD-10-CM | POA: Diagnosis not present

## 2016-12-15 DIAGNOSIS — Z950 Presence of cardiac pacemaker: Secondary | ICD-10-CM

## 2016-12-15 DIAGNOSIS — I441 Atrioventricular block, second degree: Secondary | ICD-10-CM

## 2016-12-15 LAB — CUP PACEART INCLINIC DEVICE CHECK
Battery Impedance: 1179 Ohm
Brady Statistic AS VS Percent: 0 %
Date Time Interrogation Session: 20180516140344
Implantable Lead Implant Date: 20050819
Implantable Lead Location: 753859
Implantable Lead Model: 5076
Lead Channel Impedance Value: 422 Ohm
Lead Channel Impedance Value: 463 Ohm
Lead Channel Pacing Threshold Amplitude: 1 V
Lead Channel Setting Pacing Amplitude: 2.5 V
MDC IDC LEAD IMPLANT DT: 20050819
MDC IDC LEAD LOCATION: 753860
MDC IDC MSMT BATTERY REMAINING LONGEVITY: 43 mo
MDC IDC MSMT BATTERY VOLTAGE: 2.77 V
MDC IDC MSMT LEADCHNL RA PACING THRESHOLD AMPLITUDE: 1.25 V
MDC IDC MSMT LEADCHNL RA PACING THRESHOLD PULSEWIDTH: 0.64 ms
MDC IDC MSMT LEADCHNL RA SENSING INTR AMPL: 0.5 mV
MDC IDC MSMT LEADCHNL RV PACING THRESHOLD PULSEWIDTH: 0.46 ms
MDC IDC PG IMPLANT DT: 20110302
MDC IDC SET LEADCHNL RV PACING AMPLITUDE: 2.5 V
MDC IDC SET LEADCHNL RV PACING PULSEWIDTH: 0.46 ms
MDC IDC SET LEADCHNL RV SENSING SENSITIVITY: 2.8 mV
MDC IDC STAT BRADY AP VP PERCENT: 98 %
MDC IDC STAT BRADY AP VS PERCENT: 0 %
MDC IDC STAT BRADY AS VP PERCENT: 1 %

## 2016-12-15 NOTE — Progress Notes (Signed)
Pacemaker check in clinic. Normal device function. RV thresholds, sensing, impedances consistent with previous measurements. RA sensing and impedance stable, threshold elevated at 1.5V @ 0.58ms, reprogrammed PW to 0.49ms. Device programmed to maximize longevity. 45 mode switches (0.3%)--AF/AFl per EGMs, longest 1hr 79min, no OAC due to high bleeding risk per Dr. Thompson Caul note from 08/24/16. No high ventricular rates noted. Device programmed at appropriate safety margins. Histogram distribution appropriate for patient activity level. Device programmed to optimize intrinsic conduction. Estimated longevity 3.5 years. Patient declines remote monitoring. Patient education completed. ROV with GT in 06/2017.

## 2016-12-15 NOTE — Patient Instructions (Signed)
Your physician wants you to follow-up in: November 2018 with Dr. Lovena Le. You will receive a reminder letter in the mail two months in advance. If you don't receive a letter, please call our office to schedule the follow-up appointment.

## 2017-01-01 IMAGING — CT CT ABD-PELV W/O CM
2 of 4 series · 15 of 46 positions shown, 17 images · non-contrast
Comparison: 06/26/2008 CT abdomen/ pelvis.  08/11/2007 PET-CT.

CLINICAL DATA: Vomiting. Decreased appetite. Weight loss.
Inpatient. Prostate cancer status post prostatectomy.

EXAM:
CT ABDOMEN AND PELVIS WITHOUT CONTRAST
TECHNIQUE: Multidetector CT imaging of the abdomen and pelvis was performed
following the standard protocol without IV contrast.

[Series 2: a/p w/o 5mm · axial · non-contrast · 0.69mm/px · z∈[-586,-166]mm · 12 of 92 slices shown, 14 images]
[im 4/92  soft-tissue]
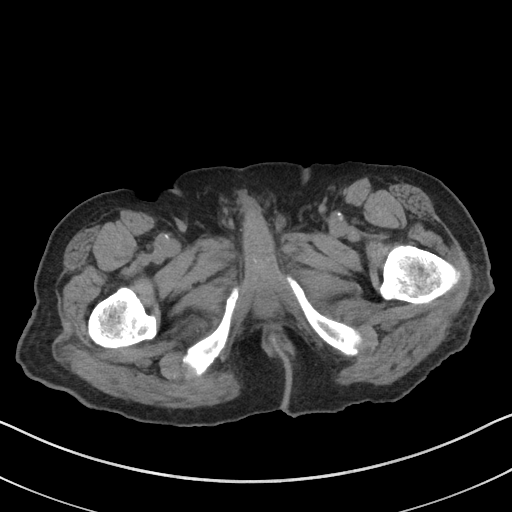
[im 4/92  bone]
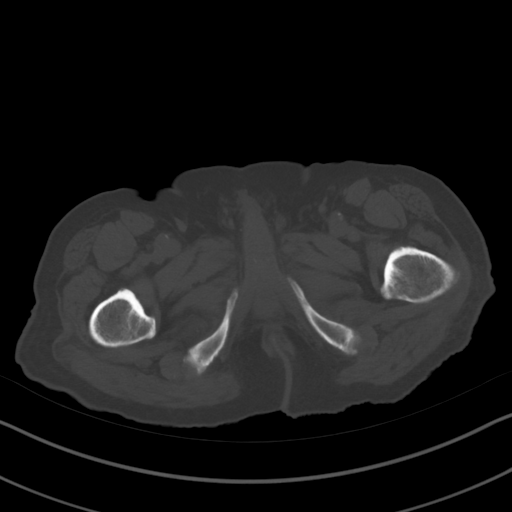
[im 12/92  soft-tissue]
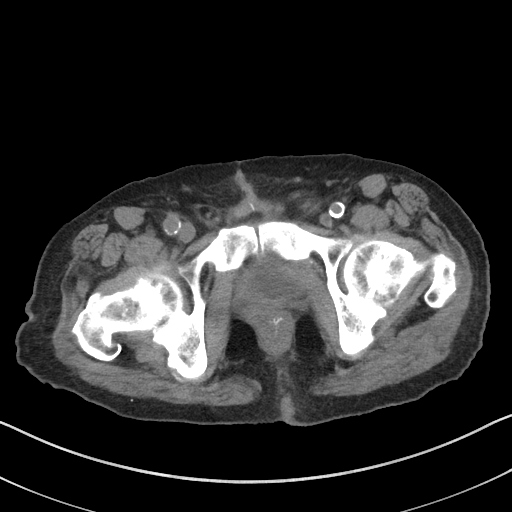
[im 19/92  soft-tissue]
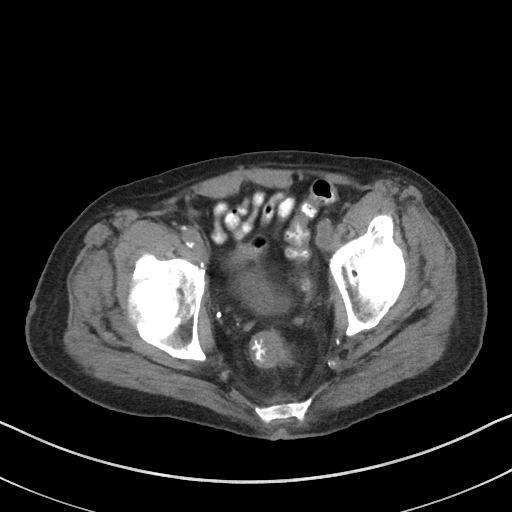
[im 27/92  soft-tissue]
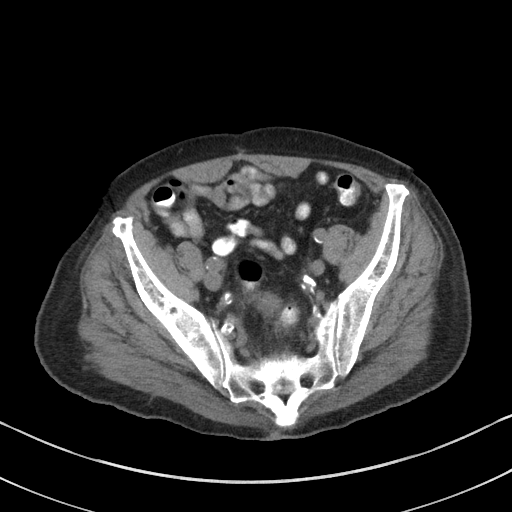
[im 35/92  soft-tissue]
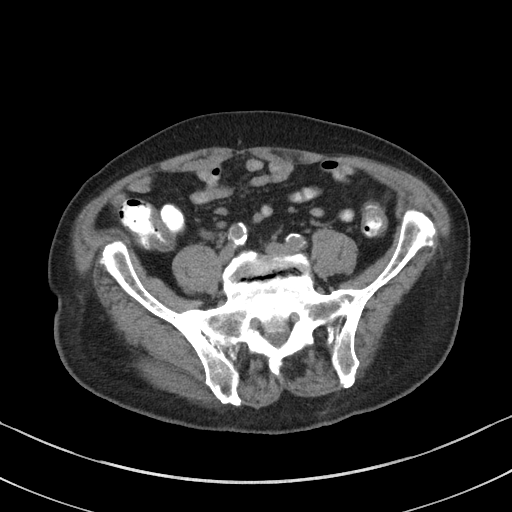
[im 42/92  soft-tissue]
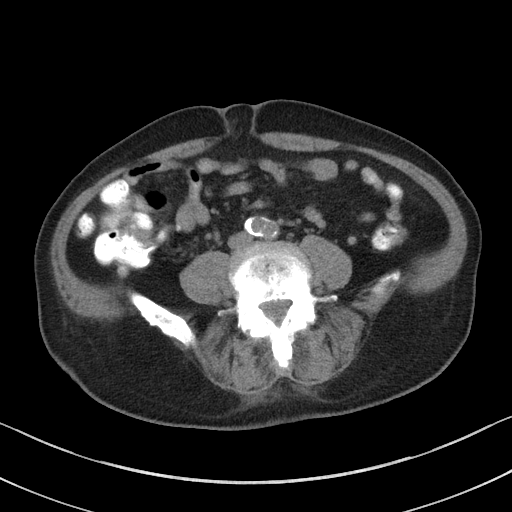
[im 50/92  soft-tissue]
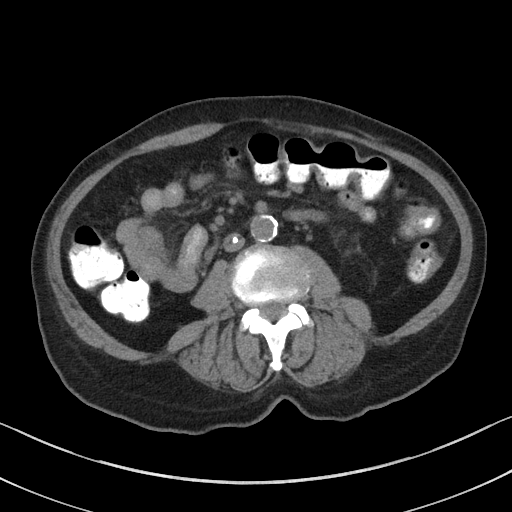
[im 57/92  soft-tissue]
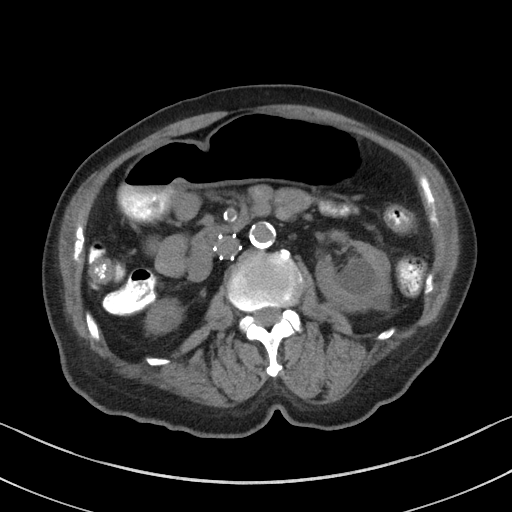
[im 65/92  soft-tissue]
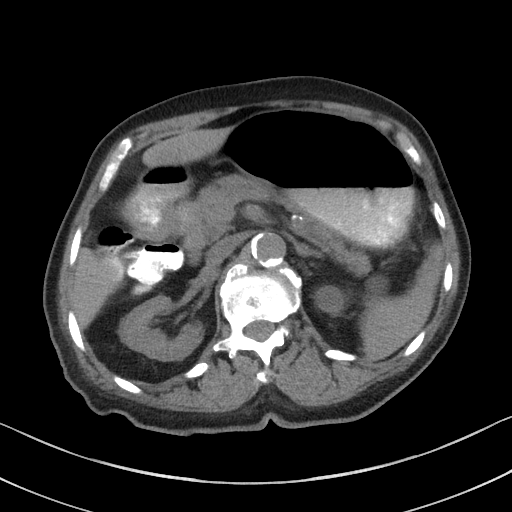
[im 65/92  bone]
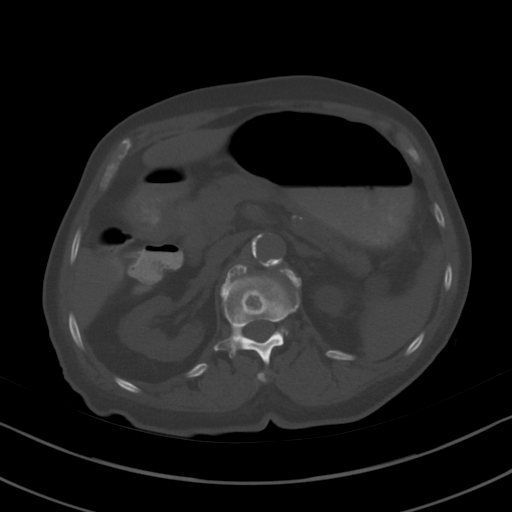
[im 73/92  soft-tissue]
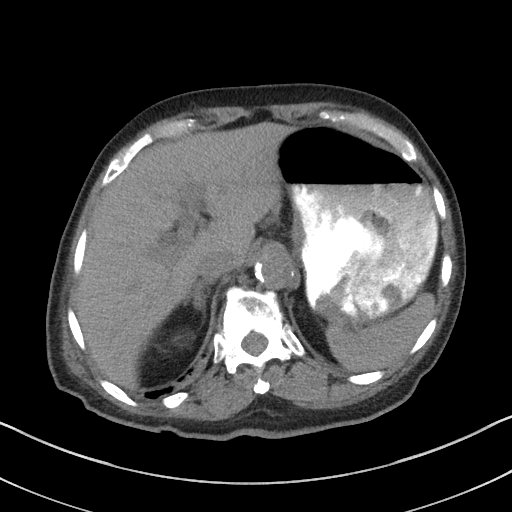
[im 80/92  soft-tissue]
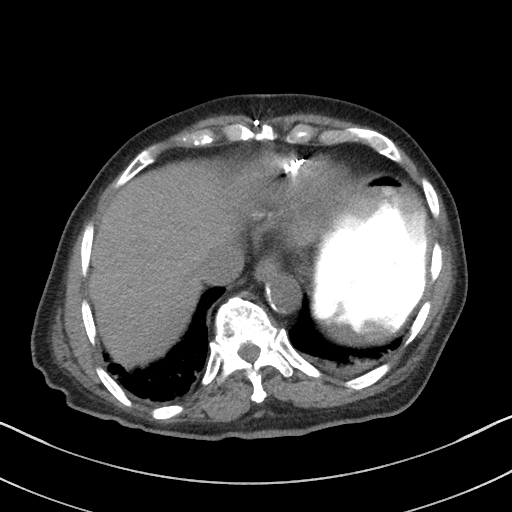
[im 88/92  soft-tissue]
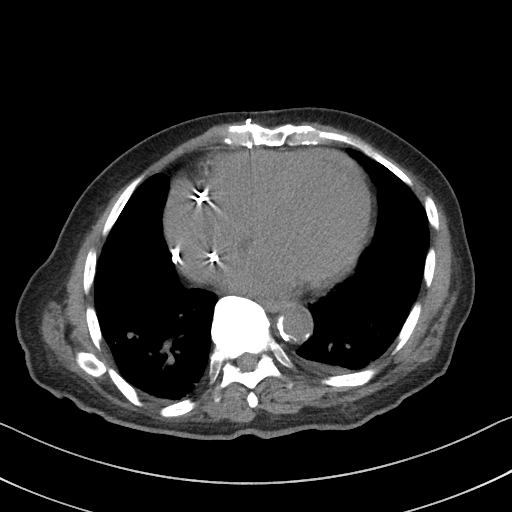

[Series 5: a/p w/o cor · coronal · non-contrast · 0.75mm/px · 3 of 109 slices shown]
[im 37/109  soft-tissue]
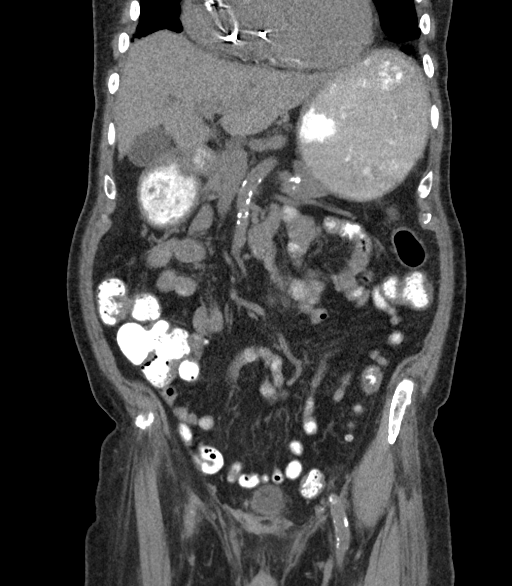
[im 49/109  soft-tissue]
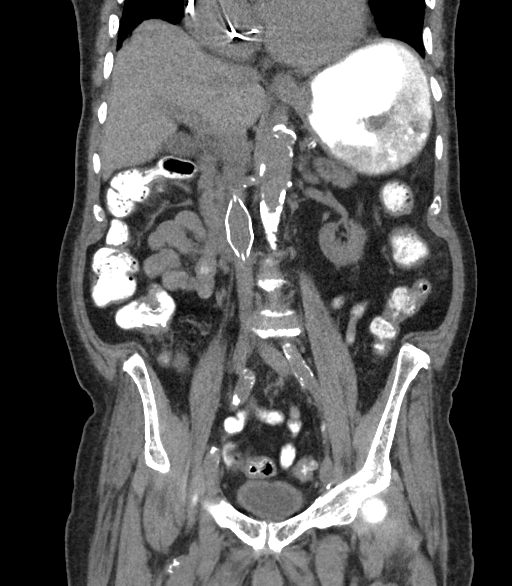
[im 61/109  soft-tissue]
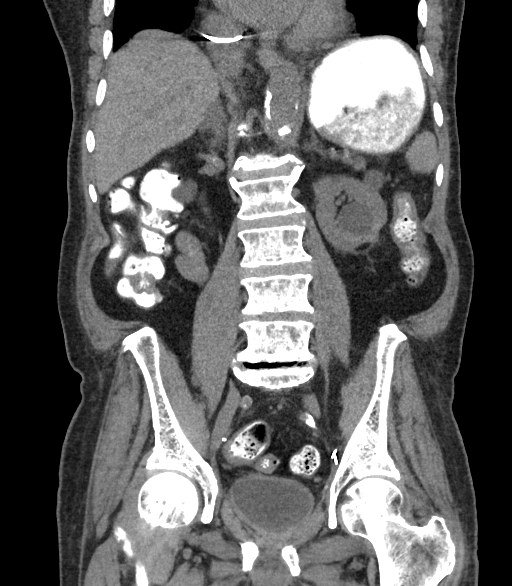

[15 of 46 positions shown; findings below may reference images not displayed]

FINDINGS: Lower chest: Trace bilateral pleural effusions, left greater than
right, with associated mild bibasilar atelectasis. Cardiomegaly.
Visualized lower median sternotomy wires are intact. The tips of
pacer leads are noted in the right atrium and right ventricular
apex.

Hepatobiliary: Simple 1.5 cm posterior segment 7 right liver lobe
cyst. Subcentimeter hypodense medial segment left liver lobe lesion,
too small to characterize, stable since 08/11/2007, suggesting a
benign lesion. No additional liver lesions. Normal gallbladder with
no radiopaque cholelithiasis. No biliary ductal dilatation.

Pancreas: Normal, with no mass or duct dilation.

Spleen: Normal size. No mass.

Adrenals/Urinary Tract: Normal adrenals. No hydronephrosis. No
nephrolithiasis. There are bilateral simple renal cysts measuring up
to 2.7 cm in the posterior right lower kidney and 2.3 cm in the
interpolar left kidney. There is a minimally complex 2.1 cm renal
cyst in the anterior lower right kidney with thin septal
calcification, unchanged since 9997, in keeping with a benign
Bosniak category 2 renal cyst. There is a minimally complex 2.8 cm
renal cyst in the lateral interpolar left kidney with an septal
calcification, unchanged since 9997, in keeping with a benign
Bosniak category 2 renal cyst. Stable chronic mild diffuse bladder
wall thickening, likely due to chronic bladder outlet obstruction.

Stomach/Bowel: Grossly normal stomach. Normal caliber small bowel
with no small bowel wall thickening. Normal appendix. Mild
diverticulosis of the descending colon, with no large bowel wall
thickening or pericolonic fat stranding.

Vascular/Lymphatic: Atherosclerotic nonaneurysmal abdominal aorta.
IVC filter appears well positioned below the level of the renal
veins. No pathologically enlarged lymph nodes in the abdomen or
pelvis.

Reproductive: Status post prostatectomy. Stable appearance of the
prostatectomy bed, with no appreciable recurrent mass in the
prostatectomy bed.

Other: No pneumoperitoneum, ascites or focal fluid collection.

Musculoskeletal: Tiny sclerotic focus in the left sacrum (series 2/
image 64) was not present on the 06/26/2008 CT. Tiny sclerotic focus
in the medial left iliac bone is stable since 08/11/2007 and
probably a benign bone island. There is a diffuse patchy appearance
of the visualized thoracolumbar spine.
IMPRESSION: 1. No acute abnormality. No evidence of bowel obstruction or acute
bowel inflammation. Normal appendix. Mild distal colonic
diverticulosis, with no evidence of acute diverticulitis.
2. Diffuse patchy appearance of the visualized thoracolumbar spine,
which is nonspecific and could represent osteopenia, although an
infiltrative process is not excluded. Tiny sclerotic focus in the
left sacrum, not present on 9997 CT, cannot exclude a tiny osseous
bone metastasis. Consider further evaluation with bone scintigraphy
study.
3. Otherwise no potential evidence of metastatic disease in the
abdomen or pelvis. No abdominopelvic lymphadenopathy.
4. Stable appearance status post prostatectomy, with no noncontrast
CT findings to suggest local tumor recurrence.
5. Trace bilateral pleural effusions with mild bibasilar
atelectasis. Cardiomegaly.

## 2017-01-03 DIAGNOSIS — R0989 Other specified symptoms and signs involving the circulatory and respiratory systems: Secondary | ICD-10-CM | POA: Insufficient documentation

## 2017-01-03 DIAGNOSIS — R198 Other specified symptoms and signs involving the digestive system and abdomen: Secondary | ICD-10-CM | POA: Insufficient documentation

## 2017-01-03 DIAGNOSIS — R09A2 Foreign body sensation, throat: Secondary | ICD-10-CM | POA: Insufficient documentation

## 2017-01-03 DIAGNOSIS — K219 Gastro-esophageal reflux disease without esophagitis: Secondary | ICD-10-CM | POA: Insufficient documentation

## 2017-03-22 ENCOUNTER — Telehealth: Payer: Self-pay

## 2017-03-22 ENCOUNTER — Encounter: Payer: Self-pay | Admitting: Family Medicine

## 2017-03-22 ENCOUNTER — Ambulatory Visit (INDEPENDENT_AMBULATORY_CARE_PROVIDER_SITE_OTHER): Payer: Medicare Other | Admitting: Family Medicine

## 2017-03-22 VITALS — BP 126/67 | HR 86 | Temp 98.0°F | Resp 16 | Ht 71.0 in | Wt 143.6 lb

## 2017-03-22 DIAGNOSIS — R4189 Other symptoms and signs involving cognitive functions and awareness: Secondary | ICD-10-CM

## 2017-03-22 DIAGNOSIS — F0150 Vascular dementia without behavioral disturbance: Secondary | ICD-10-CM

## 2017-03-22 DIAGNOSIS — N184 Chronic kidney disease, stage 4 (severe): Secondary | ICD-10-CM

## 2017-03-22 DIAGNOSIS — R638 Other symptoms and signs concerning food and fluid intake: Secondary | ICD-10-CM

## 2017-03-22 DIAGNOSIS — E1122 Type 2 diabetes mellitus with diabetic chronic kidney disease: Secondary | ICD-10-CM

## 2017-03-22 DIAGNOSIS — I2510 Atherosclerotic heart disease of native coronary artery without angina pectoris: Secondary | ICD-10-CM

## 2017-03-22 LAB — COMPREHENSIVE METABOLIC PANEL
ALBUMIN: 3.8 g/dL (ref 3.2–4.6)
ALK PHOS: 151 IU/L — AB (ref 39–117)
ALT: 10 IU/L (ref 0–44)
AST: 25 IU/L (ref 0–40)
Albumin/Globulin Ratio: 1.3 (ref 1.2–2.2)
BUN / CREAT RATIO: 18 (ref 10–24)
BUN: 54 mg/dL — AB (ref 10–36)
CHLORIDE: 103 mmol/L (ref 96–106)
CO2: 25 mmol/L (ref 20–29)
CREATININE: 2.96 mg/dL — AB (ref 0.76–1.27)
Calcium: 9.9 mg/dL (ref 8.6–10.2)
GFR calc non Af Amer: 18 mL/min/{1.73_m2} — ABNORMAL LOW (ref >59)
GFR, EST AFRICAN AMERICAN: 20 mL/min/{1.73_m2} — AB (ref >59)
GLOBULIN, TOTAL: 2.9 g/dL (ref 1.5–4.5)
Glucose: 222 mg/dL — ABNORMAL HIGH (ref 65–99)
Potassium: 4.9 mmol/L (ref 3.5–5.2)
SODIUM: 143 mmol/L (ref 134–144)
Total Protein: 6.7 g/dL (ref 6.0–8.5)

## 2017-03-22 LAB — POCT URINALYSIS DIP (MANUAL ENTRY)
Bilirubin, UA: NEGATIVE
Glucose, UA: NEGATIVE mg/dL
Ketones, POC UA: NEGATIVE mg/dL
LEUKOCYTES UA: NEGATIVE
NITRITE UA: NEGATIVE
PH UA: 6 (ref 5.0–8.0)
RBC UA: NEGATIVE
Spec Grav, UA: 1.01 (ref 1.010–1.025)
UROBILINOGEN UA: 0.2 U/dL

## 2017-03-22 LAB — POCT CBC
Granulocyte percent: 67.1 %G (ref 37–80)
HEMATOCRIT: 31.5 % — AB (ref 43.5–53.7)
HEMOGLOBIN: 10.4 g/dL — AB (ref 14.1–18.1)
LYMPH, POC: 1.4 (ref 0.6–3.4)
MCH, POC: 30.6 pg (ref 27–31.2)
MCHC: 33 g/dL (ref 31.8–35.4)
MCV: 92.7 fL (ref 80–97)
MID (cbc): 0.4 (ref 0–0.9)
MPV: 7.7 fL (ref 0–99.8)
PLATELET COUNT, POC: 157 10*3/uL (ref 142–424)
POC GRANULOCYTE: 3.7 (ref 2–6.9)
POC LYMPH %: 25.7 % (ref 10–50)
POC MID %: 7.2 %M (ref 0–12)
RBC: 3.4 M/uL — AB (ref 4.69–6.13)
RDW, POC: 13.9 %
WBC: 5.5 10*3/uL (ref 4.6–10.2)

## 2017-03-22 LAB — POC MICROSCOPIC URINALYSIS (UMFC): Mucus: ABSENT

## 2017-03-22 LAB — GLUCOSE, POCT (MANUAL RESULT ENTRY): POC GLUCOSE: 237 mg/dL — AB (ref 70–99)

## 2017-03-22 NOTE — Telephone Encounter (Signed)
Spoke with Mrs. Encalade an advised per Dr Nolon Rod pt creatinine is at baseline (normal), potassium normal  At 4.9 and pt is not dehydrated.  Advised to call office with any questions or concern.  Ms. Muniz verbalized understand and appreicative of call back.. dg

## 2017-03-22 NOTE — Progress Notes (Signed)
Chief Complaint  Patient presents with  . New Patient (Initial Visit)    fatigued x 1 month, had labs done 2 weeks ago and BUN was 50 and creatinine was 3.2.  Pt has loss of appetite x 1 month-today and yesterday first time he ate , ? dehydration, doesn't want to drink either.  Per wife she is giving glucerna.    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 His former PCP was Dr. Delfina Redwood in [place] but it was difficulty to move his and get him to his appointments so [facility] comes out monthly.  The patient is at home and his wife and 2 care givers (one in the morning and one in the evening) care for him.    His Cardiologist is Dr. Tamala Julian He has a known CAD and CHF with last LVEF of 30-35% February 2018.  He also has vascular dementia and Globus pharyngeus His wife reports that she brought him in at the urging of his 2 physician daughters who are concerned about the patients poor PO intake.  He has a chronic dementia but is conversant, does not displace behavioral disturbances or aggressive behaviors.  He typically enjoys a good breakfast but does not like to drink liquids.  Over the past month he has had worsening PO intake and ended up with very little food by mouth for about two weeks.  She was able to get him to take Glucerna but in the past 2 days he did not eat or drink much.  This morning he ate a healthy breakfast at 8:30am.  His glucose this morning was 220. Lab Results  Component Value Date   HGBA1C 7.4 (H) 09/16/2016   She reports that when he does not eat for days at a time he ends up dehydrated and his kidney function worsens and he has to get IVF Lab Results  Component Value Date   CREATININE 2.96 (H) 03/22/2017   Lab Results  Component Value Date   BUN 54 (H) 03/22/2017   Lab Results  Component Value Date   K 4.9 03/22/2017   She states that he takes his medications when given He has stage  4 kidney disease but not dialysis  He takes glucerna as needed when he does not eat  His last PCP was Dr. Delfina Redwood in [facility] Readings from Last 3 Encounters:  03/22/17 143 lb 9.6 oz (65.1 kg)  09/20/16 137 lb 12.8 oz (62.5 kg)  08/24/16 150 lb (68 kg)    Past Medical History:  Diagnosis Date  . CAD (coronary artery disease)    a. CABG 1991 and 2006 LIMA LAD, seq. SVG D1-Cfx, SVG d2,and seq. SVG AM-PDA   . Chronic systolic CHF (congestive heart failure) ([place])   . CKD (chronic kidney disease)   . Dementia   . Depression   . Diabetes mellitus   . HTN (hypertension)   . Left leg DVT ([place])   . Prostate cancer ([place])   . Tachycardia-bradycardia syndrome [facility])     Current Outpatient Prescriptions  Medication Sig Dispense Refill  . amLODipine (NORVASC) 5 MG tablet Take 1 tablet (5 mg total) by mouth daily. 30 tablet 0  . atenolol (TENORMIN) 50 MG tablet Take 50 mg by mouth 2 (two) times daily.     Marland Kitchen buPROPion (WELLBUTRIN) 100 MG tablet Take 1 tablet by mouth 2 (two) times daily.   2  .  CALCIUM PO Take 1 tablet by mouth 2 (two) times daily.     . Coenzyme Q10 (COQ10 PO) Take 1 capsule by mouth daily.    Mariane Baumgarten Sodium (DOCQLACE PO) Take 2 capsules by mouth 2 (two) times daily.    Marland Kitchen donepezil (ARICEPT) 5 MG tablet Take 5 mg by mouth at bedtime.    . feeding supplement, GLUCERNA SHAKE, (GLUCERNA SHAKE) LIQD Take 237 mLs by mouth 3 (three) times daily as needed (feeding supplement).    . furosemide (LASIX) 20 MG tablet Take 1 tablet (20 mg total) by mouth daily. 30 tablet 2  . Glucose Blood (FREESTYLE LITE TEST VI) USE AS DIRECTED    . Insulin Detemir (LEVEMIR) 100 UNIT/ML Pen Inject 8 Units into the skin daily as needed. For blood glucose per sliding scale.    . isosorbide-hydrALAZINE (BIDIL) 20-37.5 MG tablet Take 1 tablet by mouth 3 (three) times daily. 90 tablet 1  . linagliptin (TRADJENTA) 5 MG TABS tablet Take 7.5 mg by mouth daily.    . Multiple Vitamins-Minerals (ICAPS  PO) Take 1 tablet by mouth 2 (two) times daily.    . polyethylene glycol (MIRALAX / GLYCOLAX) packet Take 17 g by mouth daily as needed for moderate constipation.    . Probiotic Product (ALIGN PO) Take 1 capsule by mouth daily.    . SENNA PO Take 1 tablet by mouth daily.    . sertraline (ZOLOFT) 50 MG tablet Take 100 mg by mouth at bedtime.     . ramelteon (ROZEREM) 8 MG tablet Take 8 mg by mouth at bedtime as needed for sleep.     No current facility-administered medications for this visit.     Allergies:  Allergies  Allergen Reactions  . Iodinated Diagnostic Agents Anaphylaxis  . Iohexol Anaphylaxis     Desc: RN states pt stated he had anyphalactic shock reaction to contrast approx 10 years ago.   . Other Shortness Of Breath  . Ace Inhibitors Other (See Comments)    cough  . Simvastatin Other (See Comments)    Muscle aches  . Aspirin     Other reaction(s): Abdominal Pain  . Iodides Other (See Comments)    Past Surgical History:  Procedure Laterality Date  . CORONARY ARTERY BYPASS GRAFT    . PROSTATECTOMY      Social History   Social History  . Marital status: Married    Spouse name: N/A  . Number of children: N/A  . Years of education: N/A   Social History Main Topics  . Smoking status: Never Smoker  . Smokeless tobacco: Never Used  . Alcohol use No  . Drug use: No  . Sexual activity: Not Asked   Other Topics Concern  . None   Social History Narrative  . None    ROS  See hpi  Objective: Vitals:   03/22/17 1042  BP: 126/67  Pulse: 86  Resp: 16  Temp: 98 F (36.7 C)  TempSrc: Oral  SpO2: 96%  Weight: 143 lb 9.6 oz (65.1 kg)  Height: 5\' 11"  (1.803 m)    Physical Exam  Constitutional: He appears well-developed and well-nourished.  HENT:  Head: Normocephalic and atraumatic.  Eyes: Conjunctivae and EOM are normal.  Cardiovascular: Normal rate, regular rhythm and normal heart sounds.   Pulmonary/Chest: Effort normal and breath sounds normal. No  respiratory distress. He has no wheezes.  Neurological: He is alert.   Mouth- oral mucosa moist, no ulcers Skin: tugor normal, warm, no cyanosis  Cap refill 2-3s  Echocardiogram EF 30-35%  Lab Results  Component Value Date   CREATININE 2.96 (H) 03/22/2017   Lab Results  Component Value Date   K 4.9 03/22/2017   Component     Latest Ref Rng & Units 03/22/2017 03/22/2017        12:43 PM 12:43 PM  Color, UA     yellow yellow   Clarity, UA     clear clear   Glucose     negative mg/dL negative   Bilirubin, UA     negative negative negative  Specific Gravity, UA     1.010 - 1.025 1.010   RBC, UA     negative negative   pH, UA     5.0 - 8.0 6.0   Protein, UA     negative mg/dL =30 (A)   Urobilinogen, UA     0.2 or 1.0 E.U./dL 0.2   Nitrite, UA     Negative Negative   Leukocytes, UA     Negative Negative    Assessment and Plan [name] was seen today for new patient (initial visit).  Diagnoses and all orders for this visit:  CKD stage 4 due to type 2 diabetes mellitus ([place])- creatinine stable at baseline -     POCT CBC -     POCT glucose (manual entry) -     POCT urinalysis dipstick -     POCT Microscopic Urinalysis ([facility]) -     Comprehensive metabolic panel  Vascular dementia without behavioral disturbance- stable Cognitive deficits  Poor fluid intake- discussed that even though he has a self reported  Poor po intake his albumin is good, his specific gravity did not show dehydration. He has a known chf with ef~30% would not recommend excessive fluid intake at home nor would I recommend ivf here today Pt to go home and drink water as tolerated -     Comprehensive metabolic panel  Other orders -     Cancel: Tdap vaccine greater than or equal to 7yo IM -     Cancel: Microalbumin, urine  Resume care with [employer]  A total of 42 minutes were spent face-to-face with the patient during this encounter and over half of that time was spent on  counseling and coordination of care.  The lengthy visit was spent largely explaining why the patient should not get IV fluids and performing lab testing to prove that is not necessary.  Spent time explaining globus sensation and why the patient has a hard time swallowing. Also reviewed that chronic kidney disease is multifactorial and affects multiple organ systems    Lessa Huge A Nolon Rod

## 2017-03-22 NOTE — Patient Instructions (Addendum)
   IF you received an x-ray today, you will receive an invoice from [facility]. Please contact [facility] at [phone] with questions or concerns regarding your invoice.   IF you received labwork today, you will receive an invoice from [facility]. Please contact [facility] at [phone] with questions or concerns regarding your invoice.   Our billing staff will not be able to assist you with questions regarding bills from these companies.  You will be contacted with the lab results as soon as they are available. The fastest way to get your results is to activate your My Chart account. Instructions are located on the last page of this paperwork. If you have not heard from us regarding the results in 2 weeks, please contact this office.     Dehydration Dehydration is a condition in which there is not enough fluid or water in the body. This happens when you lose more fluids than you take in. Important organs, such as the kidneys, brain, and heart, cannot function without a proper amount of fluids. Any loss of fluids from the body can lead to dehydration. People age 81 or older have a higher risk of dehydration than younger adults because in older age, the body:  Is less able to conserve water.  Does not respond to temperature changes as well.  Does not get thirsty as easily or quickly.  Dehydration can range from mild to severe. This condition should be treated right away to prevent it from becoming severe. What are the causes? Dehydration may be caused by:  Vomiting.  Diarrhea.  Excessive sweating, such as from heat exposure or exercise.  Not drinking enough fluid, especially: ? When ill. ? While doing activity that requires a lot of energy.  Excessive urination.  Fever.  Infection.  Certain medicines, such as medicines that cause the body to lose excess fluid (diuretics).  Inability to access safe drinking water.  Poorly controlled blood  sugars.  Reduced physical ability to get adequate water and food.  What increases the risk? This condition is more likely to develop in people who:  Have a long-term (chronic) illness, such as: ? An illness that may increase urination, such as diabetes. ? Kidney, heart, or lung disease. ? Neurological or psychological disorders, such as dementia.  Are age 81 or older.  Are disabled.  Live in a place with high altitude.  What are the signs or symptoms? Symptoms of mild dehydration may include:  Thirst.  Dry lips.  Slightly dry mouth.  Dry, warm skin.  Dizziness. Symptoms of moderate dehydration may include:  Very dry mouth.  Muscle cramps.  Dark urine. Urine may be the color of tea.  Decreased urine production.  Decreased tear production.  Heartbeat that is irregular or faster than normal (palpitations).  Headache.  Light-headedness, especially when you stand up from a sitting position.  Fainting (syncope). Symptoms of severe dehydration may include:  Changes in skin, such as: ? Cold and clammy skin. ? Blotchy (mottled) or pale skin. ? Skin that does not quickly return to normal after being lightly pinched and released (poor skin turgor).  Changes in body fluids, such as: ? Extreme thirst. ? No tear production. ? Inability to sweat when body temperature is high, such as in hot weather. ? Very little urine production.  Changes in vital signs, such as: ? Weak pulse. ? Pulse that is more than 100 beats a minute when sitting still. ? Rapid breathing. ? Low blood pressure.  Other changes,   such as: ? Sunken eyes. ? Cold hands and feet. ? Confusion. ? Lack of energy (lethargy). ? Difficulty waking up from sleep. ? Short-term weight loss. ? Unconsciousness. How is this diagnosed? This condition is diagnosed based on your symptoms and a physical exam. Blood and urine tests may be done to help confirm the diagnosis. How is this treated? Treatment  for this condition depends on the severity. Mild or moderate dehydration can often be treated at home. Treatment should be started right away. Do not wait until dehydration becomes severe. Severe dehydration is an emergency and it needs to be treated in a hospital. Treatment for mild dehydration may include:  Drinking more fluids.  Replacing salts and minerals in your blood (electrolytes). Treatment for moderate dehydration may include:  Drinking an oral rehydration solution (ORS). This is a drink that helps you replace fluids and electrolytes (rehydrate). It is found at pharmacies and retail stores. Treatment for severe dehydration may include:  Receiving fluids through an IV tube.  Receiving an electrolyte solution through a tube passed through your nose and into your stomach (nasogastric tube or NG tube).  Correcting abnormalities in electrolytes.  Treating the underlying cause of dehydration. Follow these instructions at home:  If directed by your health care provider, drink an ORS: ? Make an ORS by following instructions on the package. ? Start by drinking small amounts, about  cup (120 mL) every 5-10 minutes. ? Slowly increase how much you drink until you have taken the amount recommended by your health care provider.  Drink enough clear fluid to keep your urine clear or pale yellow. If you were told to drink an ORS, finish the ORS first, then start slowly drinking other clear fluids. Drink fluids such as: ? Water. Do not drink only water. Doing that can lead to having too little salt (sodium) in the body (hyponatremia). ? Ice chips. ? Fruit juice that you have added water to (diluted fruit juice). ? Low-calorie sports drinks.  Avoid: ? Alcohol. ? Drinks that contain a lot of sugar. These include high-calorie sports drinks, fruit juice that is not diluted, and soda. ? Caffeine. ? Foods that are greasy or contain a lot of fat or sugar.  Take over-the-counter and  prescription medicines only as told by your health care provider.  Do not take sodium tablets. This can lead to having too much sodium in the body (hypernatremia).  Eat foods that contain a healthy balance of electrolytes, such as bananas, oranges, potatoes, tomatoes, and spinach.  Keep all follow-up visits as told by your health care provider. This is important. Contact a health care provider if:  You have abdominal pain that: ? Gets worse. ? Stays in one area (localizes).  You have a rash.  You have a stiff neck.  You are sleepier, more irritable, or more difficult to wake up than usual.  You feel weak, dizzy, or very thirsty. Get help right away if:  You have: ? Symptoms of severe dehydration. ? A fever. ? A severe headache. ? Vomiting or diarrhea that gets worse or does not go away. ? Diarrhea for more than 24 hours. ? Blood or green matter (bile) in your vomit. ? Blood in your stool. This may cause stool to look black and tarry. ? Trouble breathing.  You cannot drink fluids without vomiting.  Your symptoms get worse with treatment.  You have not urinated in 6-8 hours.  You have urinated only a small amount of very dark urine over   6-8 hours.  You faint.  Your heart rate while sitting still is over 100 beats a minute. This information is not intended to replace advice given to you by your health care provider. Make sure you discuss any questions you have with your health care provider. Document Released: 10/09/2003 Document Revised: 02/06/2016 Document Reviewed: 09/12/2015 Elsevier Interactive Patient Education  2017 Elsevier Inc.  

## 2017-05-25 NOTE — Progress Notes (Signed)
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  He presents today for routine electrophysiology followup.  Since last being seen in our clinic, the patient reports doing relatively well.  He is seen with his daughter today. His wife was in a car accident last week.  Per his daughter, his appetite is decreased.  He otherwise denies symptoms.   He denies chest pain, palpitations, dyspnea, PND, orthopnea, nausea, vomiting, dizziness, syncope, edema, weight gain, or early satiety.  Device History: MDT dual chamber PPM implanted 2005 for Mobitz II, gen change 2011   Past Medical History:  Diagnosis Date  . CAD (coronary artery disease)    a. CABG 1991 and 2006 LIMA LAD, seq. SVG D1-Cfx, SVG d2,and seq. SVG AM-PDA   . Chronic systolic CHF (congestive heart failure) ([place])   . CKD (chronic kidney disease)   . Dementia   . Depression   . Diabetes mellitus   . HTN (hypertension)   . Left leg DVT ([place])   . Prostate cancer ([place])   . Tachycardia-bradycardia syndrome [facility])    Past Surgical History:  Procedure Laterality Date  . CORONARY ARTERY BYPASS GRAFT    . PROSTATECTOMY      Current Outpatient Prescriptions  Medication Sig Dispense Refill  . amLODipine (NORVASC) 5 MG tablet Take 1 tablet (5 mg total) by mouth daily. 30 tablet 0  . atenolol (TENORMIN) 50 MG tablet Take 50 mg by mouth 2 (two) times daily.     Marland Kitchen buPROPion (WELLBUTRIN) 100 MG tablet Take 1 tablet by mouth 2 (two) times daily.   2  . CALCIUM PO Take 1 tablet by mouth 2 (two) times daily.     . Coenzyme Q10 (COQ10 PO) Take 1 capsule by mouth daily.    . feeding supplement, GLUCERNA SHAKE, (GLUCERNA SHAKE) LIQD Take 237 mLs by mouth 3 (three) times daily as needed (feeding supplement).      . furosemide (LASIX) 20 MG tablet Take 1 tablet (20 mg total) by mouth daily. 30 tablet 2  . Glucose Blood (FREESTYLE LITE TEST VI) USE AS DIRECTED    . Insulin Detemir (LEVEMIR) 100 UNIT/ML Pen Inject 8 Units into the skin daily as needed. For blood glucose per sliding scale.    . IRON PO Take 1 tablet by mouth daily.    . isosorbide-hydrALAZINE (BIDIL) 20-37.5 MG tablet Take 1 tablet by mouth 3 (three) times daily. 90 tablet 1  . linagliptin (TRADJENTA) 5 MG TABS tablet Take 7.5 mg by mouth daily.    . Multiple Vitamins-Minerals (ICAPS PO) Take 1 tablet by mouth 2 (two) times daily.    . Multiple Vitamins-Minerals (MULTIVITAMIN ADULT PO) Take 1 capsule by mouth daily.    . polyethylene glycol (MIRALAX / GLYCOLAX) packet Take 17 g by mouth daily as needed for moderate constipation.    . Probiotic Product (ALIGN PO) Take 1 capsule by mouth daily.    . SENNA PO Take 1 tablet by mouth daily.    . sertraline (ZOLOFT) 50 MG tablet Take 100 mg by mouth at bedtime.      No current facility-administered medications for this visit.     Allergies:   Iodinated diagnostic agents; Iohexol; Other; Ace inhibitors; Simvastatin; Aspirin; and Iodides   Social History: Social History  Social History  . Marital status: Married    Spouse name: N/A  . Number of children: N/A  . Years of education: N/A   Occupational History  . Not on file.   Social History Main Topics  . Smoking status: Never Smoker  . Smokeless tobacco: Never Used  . Alcohol use No  . Drug use: No  . Sexual activity: Not on file   Other Topics Concern  . Not on file   Social History Narrative  . No narrative on file    Family History: Family History  Problem Relation Age of Onset  . Diabetes Mother   . Heart disease Sister   . Heart disease Sister   . Heart disease Sister   . Heart disease Sister      Review of Systems: All other systems reviewed and are otherwise negative except as noted above.   Physical  Exam: VS:  BP 140/70   Pulse 76   Ht 5\' 11"  (1.803 m)   Wt 151 lb (68.5 kg)   SpO2 95%   BMI 21.06 kg/m  , BMI Body mass index is 21.06 kg/m.  GEN- The patient is well appearing, alert and oriented x 3 today.   HEENT: normocephalic, atraumatic; sclera clear, conjunctiva pink; hearing intact; oropharynx clear; neck supple  Lungs- Clear to ausculation bilaterally, normal work of breathing.  No wheezes, rales, rhonchi Heart- Regular rate and rhythm  GI- soft, non-tender, non-distended, bowel sounds present  Extremities- no clubbing, cyanosis, or edema  MS- no significant deformity or atrophy Skin- warm and dry, no rash or lesion; PPM pocket well healed Psych- euthymic mood, full affect Neuro- strength and sensation are intact  PPM Interrogation- reviewed in detail today,  See PACEART report  EKG:  EKG is not ordered today.  Recent Labs: 09/15/2016: B Natriuretic Peptide 2,779.7; Magnesium 2.1 09/18/2016: Platelets 171 03/22/2017: ALT 10; BUN 54; Creatinine, Ser 2.96; Hemoglobin 10.4; Potassium 4.9; Sodium 143   Wt Readings from Last 3 Encounters:  05/27/17 151 lb (68.5 kg)  03/22/17 143 lb 9.6 oz (65.1 kg)  09/20/16 137 lb 12.8 oz (62.5 kg)      Assessment and Plan:  1.  Mobitz II Normal PPM function See Pace Art report No changes today Pt declines remote monitoring  2.  Paroxysmal atrial fibrillation Burden by device interrogation low CHADS2VASC is at least 5. He is not felt to be a candidate for [place] 2/2 falls with head trauma and GIB  3.  HTN Stable No change required today  4.  CAD No recent ischemic symptoms Continue current therapy    Current medicines are reviewed at length with the patient today.   The patient does not have concerns regarding his medicines.  The following changes were made today:  none  Labs/ tests ordered today include: none Orders Placed This Encounter  Procedures  . Flu Vaccine QUAD 36+ mos IM     Disposition:   Follow up with  device clinic 6 months, Dr Lovena Le 1 year      Signed, Chanetta Marshall, NP 05/27/2017 4:01 PM  [facility] [address] [place]  [place] [address] [phone] (office) [phone] (fax)

## 2017-05-27 ENCOUNTER — Ambulatory Visit (INDEPENDENT_AMBULATORY_CARE_PROVIDER_SITE_OTHER): Payer: Medicare Other | Admitting: Nurse Practitioner

## 2017-05-27 ENCOUNTER — Encounter (INDEPENDENT_AMBULATORY_CARE_PROVIDER_SITE_OTHER): Payer: Self-pay

## 2017-05-27 ENCOUNTER — Encounter: Payer: Self-pay | Admitting: Nurse Practitioner

## 2017-05-27 VITALS — BP 140/70 | HR 76 | Ht 71.0 in | Wt 151.0 lb

## 2017-05-27 DIAGNOSIS — Z23 Encounter for immunization: Secondary | ICD-10-CM | POA: Diagnosis not present

## 2017-05-27 DIAGNOSIS — I480 Paroxysmal atrial fibrillation: Secondary | ICD-10-CM | POA: Diagnosis not present

## 2017-05-27 DIAGNOSIS — I441 Atrioventricular block, second degree: Secondary | ICD-10-CM | POA: Diagnosis not present

## 2017-05-27 DIAGNOSIS — I10 Essential (primary) hypertension: Secondary | ICD-10-CM

## 2017-05-27 DIAGNOSIS — I2510 Atherosclerotic heart disease of native coronary artery without angina pectoris: Secondary | ICD-10-CM

## 2017-05-27 NOTE — Patient Instructions (Addendum)
Medication Instructions:   Your physician recommends that you continue on your current medications as directed. Please refer to the Current Medication list given to you today.   If you need a refill on your cardiac medications before your next appointment, please call your pharmacy.  Labwork: NONE ORDERED  TODAY    Testing/Procedures: NONE ORDERED  TODAY    Follow-Up:    IN January WITH DR [place] ON SAME DAY   Your physician wants you to follow-up in:  IN 6 MONTHS WITH DR Knox Saliva will receive a reminder letter in the mail two months in advance. If you don't receive a letter, please call our office to schedule the follow-up appointment.      Any Other Special Instructions Will Be Listed Below (If Applicable).

## 2017-08-11 ENCOUNTER — Telehealth: Payer: Self-pay | Admitting: Interventional Cardiology

## 2017-08-11 NOTE — Telephone Encounter (Signed)
Spoke with daughter, Youlanda Mighty, [place] on file.  She states pt went into hospital in [place] on 12/27 for increased agitation.  While there pt did not receive Atenolol.  BP was elevated while in hospital. Daughter concerned about medication changes that were made upon discharge and wanted to get Dr. Thompson Caul opinion on whether or not these would be ok.  They are as follows:  1) Pt was placed back on Atenolol 50mg  BID at d/c 2) Sertaline was decreased to 50mg  QD 3) Wellbutrin was d/c'ed 4) Pt was started on Seroquel 50mg  at HS and can take 12.5mg  q 8hrs PRN for increased agitation  BP last night was 155/80's, HR 60 and this morning BP was 136/67, HR 60.  Biggest concern was the change with the Seroquel in relation to cardiac history and cardiac meds.  Advised I would send message to Dr. Tamala Julian for review and advisement.  I have sent message to medical records to see if we can obtain hospital records from [place] as well.

## 2017-08-11 NOTE — Telephone Encounter (Signed)
New message     Patient daughter calling to report patient had been in the hospital in [place], ([place]). Daughter wants to discuss the medications he was sent home with. Please call

## 2017-08-12 NOTE — Telephone Encounter (Signed)
Spoke with daughter, Youlanda Mighty and made her aware of Dr. Thompson Caul recommendations.  She states while in hospital there were QT changes and that's why they decreased the dose of Seroquel.  She said they can certainly speak to PCP about that.  She wanted to verify that Dr. Tamala Julian was ok with all of pt's BP meds as prescribed. Spoke with Dr. Tamala Julian and he said fine to continue Bidil, Atenolol and Amlodipine.  Advised daughter to continue monitoring BP and let us know if consistently higher than 145/90 as outlined in last OV note.  Advised to also keep appt next week with Dr. Tamala Julian.

## 2017-08-12 NOTE — Telephone Encounter (Signed)
Seroquel can cause QT prolongation.  I would prefer some other agent be used to help with managing agitation.  They will need to speak with primary care about alternative medication choices.

## 2017-08-18 ENCOUNTER — Ambulatory Visit: Payer: Self-pay | Admitting: Interventional Cardiology

## 2017-09-28 ENCOUNTER — Ambulatory Visit: Payer: Self-pay | Admitting: Interventional Cardiology

## 2017-10-19 ENCOUNTER — Inpatient Hospital Stay (HOSPITAL_COMMUNITY)
Admission: EM | Admit: 2017-10-19 | Discharge: 2017-10-22 | DRG: 392 | Disposition: A | Discharge status: Home / Self Care | Payer: Medicare Other | Attending: Internal Medicine | Admitting: Internal Medicine

## 2017-10-19 ENCOUNTER — Emergency Department (HOSPITAL_COMMUNITY): Payer: Medicare Other

## 2017-10-19 ENCOUNTER — Encounter (HOSPITAL_COMMUNITY): Payer: Self-pay

## 2017-10-19 ENCOUNTER — Other Ambulatory Visit: Payer: Self-pay

## 2017-10-19 DIAGNOSIS — Z794 Long term (current) use of insulin: Secondary | ICD-10-CM

## 2017-10-19 DIAGNOSIS — Z955 Presence of coronary angioplasty implant and graft: Secondary | ICD-10-CM

## 2017-10-19 DIAGNOSIS — E785 Hyperlipidemia, unspecified: Secondary | ICD-10-CM | POA: Diagnosis present

## 2017-10-19 DIAGNOSIS — Z8249 Family history of ischemic heart disease and other diseases of the circulatory system: Secondary | ICD-10-CM

## 2017-10-19 DIAGNOSIS — Z86718 Personal history of other venous thrombosis and embolism: Secondary | ICD-10-CM | POA: Diagnosis not present

## 2017-10-19 DIAGNOSIS — H9190 Unspecified hearing loss, unspecified ear: Secondary | ICD-10-CM | POA: Diagnosis present

## 2017-10-19 DIAGNOSIS — I447 Left bundle-branch block, unspecified: Secondary | ICD-10-CM | POA: Diagnosis present

## 2017-10-19 DIAGNOSIS — I130 Hypertensive heart and chronic kidney disease with heart failure and stage 1 through stage 4 chronic kidney disease, or unspecified chronic kidney disease: Secondary | ICD-10-CM | POA: Diagnosis present

## 2017-10-19 DIAGNOSIS — Z833 Family history of diabetes mellitus: Secondary | ICD-10-CM

## 2017-10-19 DIAGNOSIS — Z8546 Personal history of malignant neoplasm of prostate: Secondary | ICD-10-CM | POA: Diagnosis not present

## 2017-10-19 DIAGNOSIS — K5900 Constipation, unspecified: Secondary | ICD-10-CM | POA: Diagnosis present

## 2017-10-19 DIAGNOSIS — Z886 Allergy status to analgesic agent status: Secondary | ICD-10-CM | POA: Diagnosis not present

## 2017-10-19 DIAGNOSIS — I441 Atrioventricular block, second degree: Secondary | ICD-10-CM | POA: Diagnosis present

## 2017-10-19 DIAGNOSIS — I5043 Acute on chronic combined systolic (congestive) and diastolic (congestive) heart failure: Secondary | ICD-10-CM | POA: Diagnosis not present

## 2017-10-19 DIAGNOSIS — K5641 Fecal impaction: Secondary | ICD-10-CM | POA: Diagnosis not present

## 2017-10-19 DIAGNOSIS — Z950 Presence of cardiac pacemaker: Secondary | ICD-10-CM

## 2017-10-19 DIAGNOSIS — Z888 Allergy status to other drugs, medicaments and biological substances status: Secondary | ICD-10-CM | POA: Diagnosis not present

## 2017-10-19 DIAGNOSIS — R4182 Altered mental status, unspecified: Secondary | ICD-10-CM

## 2017-10-19 DIAGNOSIS — R471 Dysarthria and anarthria: Secondary | ICD-10-CM | POA: Diagnosis present

## 2017-10-19 DIAGNOSIS — I2510 Atherosclerotic heart disease of native coronary artery without angina pectoris: Secondary | ICD-10-CM | POA: Diagnosis present

## 2017-10-19 DIAGNOSIS — G9340 Encephalopathy, unspecified: Secondary | ICD-10-CM | POA: Diagnosis present

## 2017-10-19 DIAGNOSIS — Z91041 Radiographic dye allergy status: Secondary | ICD-10-CM | POA: Diagnosis not present

## 2017-10-19 DIAGNOSIS — Z79899 Other long term (current) drug therapy: Secondary | ICD-10-CM

## 2017-10-19 DIAGNOSIS — J9811 Atelectasis: Secondary | ICD-10-CM | POA: Diagnosis present

## 2017-10-19 DIAGNOSIS — I495 Sick sinus syndrome: Secondary | ICD-10-CM | POA: Diagnosis not present

## 2017-10-19 DIAGNOSIS — R29703 NIHSS score 3: Secondary | ICD-10-CM | POA: Diagnosis present

## 2017-10-19 DIAGNOSIS — Z951 Presence of aortocoronary bypass graft: Secondary | ICD-10-CM

## 2017-10-19 DIAGNOSIS — K529 Noninfective gastroenteritis and colitis, unspecified: Secondary | ICD-10-CM | POA: Diagnosis not present

## 2017-10-19 DIAGNOSIS — N184 Chronic kidney disease, stage 4 (severe): Secondary | ICD-10-CM | POA: Diagnosis present

## 2017-10-19 DIAGNOSIS — G9341 Metabolic encephalopathy: Secondary | ICD-10-CM | POA: Diagnosis not present

## 2017-10-19 DIAGNOSIS — N183 Chronic kidney disease, stage 3 (moderate): Secondary | ICD-10-CM | POA: Diagnosis not present

## 2017-10-19 DIAGNOSIS — F0150 Vascular dementia without behavioral disturbance: Secondary | ICD-10-CM | POA: Diagnosis present

## 2017-10-19 DIAGNOSIS — D649 Anemia, unspecified: Secondary | ICD-10-CM | POA: Diagnosis present

## 2017-10-19 DIAGNOSIS — K219 Gastro-esophageal reflux disease without esophagitis: Secondary | ICD-10-CM | POA: Diagnosis present

## 2017-10-19 DIAGNOSIS — E1122 Type 2 diabetes mellitus with diabetic chronic kidney disease: Secondary | ICD-10-CM | POA: Diagnosis present

## 2017-10-19 DIAGNOSIS — F329 Major depressive disorder, single episode, unspecified: Secondary | ICD-10-CM | POA: Diagnosis present

## 2017-10-19 DIAGNOSIS — Z9181 History of falling: Secondary | ICD-10-CM

## 2017-10-19 DIAGNOSIS — K5730 Diverticulosis of large intestine without perforation or abscess without bleeding: Secondary | ICD-10-CM | POA: Diagnosis present

## 2017-10-19 DIAGNOSIS — R479 Unspecified speech disturbances: Secondary | ICD-10-CM | POA: Diagnosis not present

## 2017-10-19 DIAGNOSIS — Z9079 Acquired absence of other genital organ(s): Secondary | ICD-10-CM | POA: Diagnosis not present

## 2017-10-19 DIAGNOSIS — F419 Anxiety disorder, unspecified: Secondary | ICD-10-CM | POA: Diagnosis present

## 2017-10-19 DIAGNOSIS — E1121 Type 2 diabetes mellitus with diabetic nephropathy: Secondary | ICD-10-CM

## 2017-10-19 DIAGNOSIS — I5022 Chronic systolic (congestive) heart failure: Secondary | ICD-10-CM | POA: Diagnosis not present

## 2017-10-19 DIAGNOSIS — I5042 Chronic combined systolic (congestive) and diastolic (congestive) heart failure: Secondary | ICD-10-CM | POA: Diagnosis present

## 2017-10-19 DIAGNOSIS — G92 Toxic encephalopathy: Secondary | ICD-10-CM | POA: Diagnosis not present

## 2017-10-19 DIAGNOSIS — Z86010 Personal history of colonic polyps: Secondary | ICD-10-CM

## 2017-10-19 DIAGNOSIS — I480 Paroxysmal atrial fibrillation: Secondary | ICD-10-CM | POA: Diagnosis present

## 2017-10-19 DIAGNOSIS — E119 Type 2 diabetes mellitus without complications: Secondary | ICD-10-CM | POA: Diagnosis not present

## 2017-10-19 DIAGNOSIS — I700 Atherosclerosis of aorta: Secondary | ICD-10-CM | POA: Diagnosis present

## 2017-10-19 LAB — LIPID PANEL
CHOLESTEROL: 178 mg/dL (ref 0–200)
HDL: 38 mg/dL — AB (ref >40)
LDL CALC: 105 mg/dL — AB (ref 0–99)
TRIGLYCERIDES: 175 mg/dL — AB (ref <150)
Total CHOL/HDL Ratio: 4.7 RATIO
VLDL: 35 mg/dL (ref 0–40)

## 2017-10-19 LAB — URINALYSIS, ROUTINE W REFLEX MICROSCOPIC
BILIRUBIN URINE: NEGATIVE
Glucose, UA: 50 mg/dL — AB
Hgb urine dipstick: NEGATIVE
KETONES UR: NEGATIVE mg/dL
LEUKOCYTES UA: NEGATIVE
NITRITE: NEGATIVE
Protein, ur: NEGATIVE mg/dL
Specific Gravity, Urine: 1.009 (ref 1.005–1.030)
pH: 7 (ref 5.0–8.0)

## 2017-10-19 LAB — COMPREHENSIVE METABOLIC PANEL
ALT: 33 U/L (ref 17–63)
AST: 40 U/L (ref 15–41)
Albumin: 3.6 g/dL (ref 3.5–5.0)
Alkaline Phosphatase: 112 U/L (ref 38–126)
Anion gap: 10 (ref 5–15)
BUN: 48 mg/dL — AB (ref 6–20)
CO2: 26 mmol/L (ref 22–32)
CREATININE: 3.02 mg/dL — AB (ref 0.61–1.24)
Calcium: 9.6 mg/dL (ref 8.9–10.3)
Chloride: 105 mmol/L (ref 101–111)
GFR calc Af Amer: 19 mL/min — ABNORMAL LOW (ref >60)
GFR, EST NON AFRICAN AMERICAN: 17 mL/min — AB (ref >60)
GLUCOSE: 228 mg/dL — AB (ref 65–99)
POTASSIUM: 3.5 mmol/L (ref 3.5–5.1)
Sodium: 141 mmol/L (ref 135–145)
TOTAL PROTEIN: 6.8 g/dL (ref 6.5–8.1)
Total Bilirubin: 0.3 mg/dL (ref 0.3–1.2)

## 2017-10-19 LAB — LIPASE, BLOOD: Lipase: 92 U/L — ABNORMAL HIGH (ref 11–51)

## 2017-10-19 LAB — CBC
HCT: 31.9 % — ABNORMAL LOW (ref 39.0–52.0)
Hemoglobin: 10.9 g/dL — ABNORMAL LOW (ref 13.0–17.0)
MCH: 30.4 pg (ref 26.0–34.0)
MCHC: 34.2 g/dL (ref 30.0–36.0)
MCV: 89.1 fL (ref 78.0–100.0)
Platelets: 144 10*3/uL — ABNORMAL LOW (ref 150–400)
RBC: 3.58 MIL/uL — ABNORMAL LOW (ref 4.22–5.81)
RDW: 13.2 % (ref 11.5–15.5)
WBC: 6.6 10*3/uL (ref 4.0–10.5)

## 2017-10-19 LAB — GLUCOSE, CAPILLARY
GLUCOSE-CAPILLARY: 192 mg/dL — AB (ref 65–99)
Glucose-Capillary: 210 mg/dL — ABNORMAL HIGH (ref 65–99)

## 2017-10-19 LAB — HEMOGLOBIN A1C
HEMOGLOBIN A1C: 7.4 % — AB (ref 4.8–5.6)
MEAN PLASMA GLUCOSE: 165.68 mg/dL

## 2017-10-19 LAB — I-STAT TROPONIN, ED: Troponin i, poc: 0.03 ng/mL (ref 0.00–0.08)

## 2017-10-19 MED ORDER — QUETIAPINE FUMARATE 50 MG PO TABS
50.0000 mg | ORAL_TABLET | ORAL | Status: AC
  Administered 2017-10-19: 50 mg via ORAL
  Filled 2017-10-19: qty 1

## 2017-10-19 MED ORDER — CEFTRIAXONE SODIUM 1 G IJ SOLR
1.0000 g | Freq: Once | INTRAMUSCULAR | Status: AC
  Administered 2017-10-19: 1 g via INTRAVENOUS
  Filled 2017-10-19: qty 10

## 2017-10-19 MED ORDER — STROKE: EARLY STAGES OF RECOVERY BOOK
Freq: Once | Status: AC
  Administered 2017-10-20: 05:00:00
  Filled 2017-10-19: qty 1

## 2017-10-19 MED ORDER — PIPERACILLIN-TAZOBACTAM IN DEX 2-0.25 GM/50ML IV SOLN
2.2500 g | Freq: Four times a day (QID) | INTRAVENOUS | Status: DC
  Administered 2017-10-19 – 2017-10-21 (×8): 2.25 g via INTRAVENOUS
  Filled 2017-10-19 (×10): qty 50

## 2017-10-19 MED ORDER — SODIUM CHLORIDE 0.9 % IV SOLN
INTRAVENOUS | Status: DC
Start: 2017-10-19 — End: 2017-10-20
  Administered 2017-10-19 (×2): via INTRAVENOUS

## 2017-10-19 MED ORDER — SODIUM CHLORIDE 0.9 % IV SOLN
INTRAVENOUS | Status: DC
  Administered 2017-10-19: 11:00:00 via INTRAVENOUS

## 2017-10-19 MED ORDER — ORAL CARE MOUTH RINSE
15.0000 mL | Freq: Two times a day (BID) | OROMUCOSAL | Status: DC
  Administered 2017-10-20 – 2017-10-22 (×3): 15 mL via OROMUCOSAL

## 2017-10-19 MED ORDER — GLYCERIN (LAXATIVE) 2.1 G RE SUPP
1.0000 | Freq: Every day | RECTAL | Status: DC | PRN
  Filled 2017-10-19: qty 1

## 2017-10-19 MED ORDER — POLYETHYLENE GLYCOL 3350 17 G PO PACK
17.0000 g | PACK | Freq: Every day | ORAL | Status: AC
  Administered 2017-10-19 – 2017-10-20 (×2): 17 g via ORAL
  Filled 2017-10-19 (×2): qty 1

## 2017-10-19 MED ORDER — INSULIN ASPART 100 UNIT/ML ~~LOC~~ SOLN
0.0000 [IU] | SUBCUTANEOUS | Status: DC
Start: 2017-10-19 — End: 2017-10-22
  Administered 2017-10-20 – 2017-10-21 (×7): 2 [IU] via SUBCUTANEOUS
  Administered 2017-10-21: 5 [IU] via SUBCUTANEOUS
  Administered 2017-10-22: 3 [IU] via SUBCUTANEOUS

## 2017-10-19 MED ORDER — FLEET ENEMA 7-19 GM/118ML RE ENEM: 1.0000 | ENEMA | Freq: Once | RECTAL | Status: DC

## 2017-10-19 MED ORDER — MILK AND MOLASSES ENEMA
1.0000 | Freq: Once | RECTAL | Status: DC
  Filled 2017-10-19: qty 250

## 2017-10-19 MED ORDER — DOCUSATE SODIUM 100 MG PO CAPS
100.0000 mg | ORAL_CAPSULE | Freq: Two times a day (BID) | ORAL | Status: DC | PRN
  Administered 2017-10-19: 100 mg via ORAL
  Filled 2017-10-19: qty 1

## 2017-10-19 MED ORDER — QUETIAPINE FUMARATE 50 MG PO TABS
50.0000 mg | ORAL_TABLET | ORAL | Status: DC
  Administered 2017-10-20 – 2017-10-21 (×2): 50 mg via ORAL
  Filled 2017-10-19 (×2): qty 1

## 2017-10-19 MED ORDER — ACETAMINOPHEN 650 MG RE SUPP: 650.0000 mg | Freq: Four times a day (QID) | RECTAL | Status: DC | PRN

## 2017-10-19 MED ORDER — INSULIN DETEMIR 100 UNIT/ML ~~LOC~~ SOLN
4.0000 [IU] | Freq: Every day | SUBCUTANEOUS | Status: DC
  Filled 2017-10-19 (×2): qty 0.04

## 2017-10-19 MED ORDER — ONDANSETRON HCL 4 MG/2ML IJ SOLN: 4.0000 mg | Freq: Four times a day (QID) | INTRAMUSCULAR | Status: DC | PRN

## 2017-10-19 MED ORDER — METRONIDAZOLE IN NACL 5-0.79 MG/ML-% IV SOLN
500.0000 mg | Freq: Once | INTRAVENOUS | Status: AC
  Administered 2017-10-19: 500 mg via INTRAVENOUS
  Filled 2017-10-19: qty 100

## 2017-10-19 MED ORDER — SENNA 8.6 MG PO TABS: 1.0000 | ORAL_TABLET | Freq: Every evening | ORAL | Status: DC | PRN

## 2017-10-19 MED ORDER — ASPIRIN 300 MG RE SUPP
300.0000 mg | Freq: Once | RECTAL | Status: DC
  Filled 2017-10-19: qty 1

## 2017-10-19 MED ORDER — ACETAMINOPHEN 325 MG PO TABS: 650.0000 mg | ORAL_TABLET | Freq: Four times a day (QID) | ORAL | Status: DC | PRN

## 2017-10-19 MED ORDER — BISACODYL 10 MG RE SUPP
10.0000 mg | Freq: Every day | RECTAL | Status: DC | PRN
  Administered 2017-10-19: 10 mg via RECTAL
  Filled 2017-10-19: qty 1

## 2017-10-19 MED ORDER — INSULIN ASPART 100 UNIT/ML ~~LOC~~ SOLN
0.0000 [IU] | Freq: Three times a day (TID) | SUBCUTANEOUS | Status: DC
  Administered 2017-10-19: 2 [IU] via SUBCUTANEOUS
  Administered 2017-10-19: 3 [IU] via SUBCUTANEOUS

## 2017-10-19 MED ORDER — ONDANSETRON HCL 4 MG/2ML IJ SOLN
4.0000 mg | Freq: Once | INTRAMUSCULAR | Status: AC | PRN
  Administered 2017-10-19: 4 mg via INTRAVENOUS
  Filled 2017-10-19: qty 2

## 2017-10-19 MED ORDER — ONDANSETRON HCL 4 MG PO TABS: 4.0000 mg | ORAL_TABLET | Freq: Four times a day (QID) | ORAL | Status: DC | PRN

## 2017-10-19 MED ORDER — QUETIAPINE FUMARATE 25 MG PO TABS
25.0000 mg | ORAL_TABLET | Freq: Every day | ORAL | Status: DC | PRN
Start: 2017-10-19 — End: 2017-10-22
  Administered 2017-10-21: 25 mg via ORAL
  Filled 2017-10-19: qty 1

## 2017-10-19 NOTE — Progress Notes (Signed)
Pharmacy Antibiotic Note  [name] is a 84 y.o. male admitted on 10/19/2017 with intra-abdominal.  Pharmacy has been consulted for piperacillin/tazobactam dosing. CT reveals possible colitis  Today, 10/19/2017  CKD  Afebrile  WBC WNL  Plan:  Zosyn 2.25gm IV q6h based on current estimated CrCl  Monitor renal function  Temp (24hrs), Avg:98.2 F (36.8 C), Min:98.2 F (36.8 C), Max:98.2 F (36.8 C)  Recent Labs  Lab 10/19/17 0212  WBC 6.6  CREATININE 3.02*    CrCl cannot be calculated (Unknown ideal weight.).    Allergies  Allergen Reactions  . Iodinated Diagnostic Agents Anaphylaxis  . Iohexol Anaphylaxis     Desc: RN states pt stated he had anyphalactic shock reaction to contrast approx 10 years ago.   . Other Shortness Of Breath  . Ace Inhibitors Other (See Comments)    cough  . Simvastatin Other (See Comments)    Muscle aches  . Aspirin     Other reaction(s): Abdominal Pain  . Iodides Other (See Comments)    Antimicrobials this admission: 3/20 ceftriaxone/flagyl x1 in ED 3/20 zosyn >>  Dose adjustments this admission:  Microbiology results: None  Thank you for allowing pharmacy to be a part of this patient's care.  Doreene Eland, PharmD, BCPS.   Pager: [phone] 10/19/2017 7:59 AM

## 2017-10-19 NOTE — Progress Notes (Signed)
Patient has meds from home that need to be sent home or to the pharmacy

## 2017-10-19 NOTE — Progress Notes (Signed)
The patient's family notified the RN that the patient has home medications that need to be resumed. The patient was NPO earlier today. The PCP was notified. Awaiting orders from PCP.

## 2017-10-19 NOTE — ED Notes (Signed)
Patient's wife shared her concerns of her husband stuttering and seeming more confused than usual. This nurse and tech were in room multiple times to change patient due to incontinence and did not notice a change in the patient. Dr Randal Buba assessed patient and ordered CT. Awaiting CT before beginning enema.

## 2017-10-19 NOTE — Consult Note (Signed)
[facility] TeleNeurology Consult Services  Patient was informed the Neurology Consult would happen via telehealth (remote video) and consented to receiving care in this manner.  Impression: Encephalopathy - patient with dementia  presents with worsening confusion in the setting of colitis. Neurologic exam shows disorientation however he is still abel to follow commands. Exam is otherwise non focal. Recommend to get CT head w/o to evaluate for any acute changes. Patient has pacemaker and may not be able to get MRI unless compatible.   Comments:  [facility] contacted: 7:01  [facility] at bedside: 7:04   Recommendations: Admit for further work-up Noncontrast [facility] Further inpatient evaluation as per Neurology/ Internal Medicine Discussed with ED MD  History of Present Illness  Patient is a 84 years old man who presents to the ED c/o vomiting and altered mental status.  He was found with colitis in the ED. As per wife, patient has been vomiting since last night. She also noticed that he is more confused and is also stuttering. He has dementia at baseline. On a normal day, he requires assistance with dressing, bathing, and walking but he can feed himself. As per wife, he also has heart problems, s/p pacemaker, prostate cancer, and HTN. No history of stroke.  Exam  NIHSS score:  3 1A: Level of Consciousness - Alert; keenly responsive 1B: Ask Month and Age - 0 Questions Right 1C: 'Blink Eyes' & 'Squeeze Hands' - Performs 1 Task 2: Test Horizontal Extraocular Movements - Normal 3: Test Visual Fields - No Visual Loss 4: Test Facial Palsy - Normal symmetry 5A: Test Left Arm Motor Drift - No Drift for 10 Seconds 5B: Test Right Arm Motor Drift - No Drift for 10 Seconds 6A: Test Left Leg Motor Drift - No Drift for 5 Seconds 6B: Test Right Leg Motor Drift - No Drift for 5 Seconds 7: Test Limb Ataxia - No Ataxia 8: Test Sensation - Normal; No sensory loss 9: Test Language/Aphasia  - Normal; No aphasia 10: Test Dysarthria - Normal 11: Test Extinction/Inattention - No abnormality  Medical Decision Making:  - Extensive number of diagnosis or management options are considered above.  - Extensive amount of complex data reviewed.  - High risk of complication and/or morbidity or mortality are associated with differential diagnostic considerations above.  - There may be Uncertain outcome and increased probability of prolonged functional impairment or high probability of severe prolonged functional impairment associated with some of these differential diagnosis.  Medical Data Reviewed:  1.Data reviewed include clinical labs, radiology, Medical Tests;  2.Tests results discussed w/performing or interpreting physician;  3.Obtaining/reviewing old medical records;  4.Obtaining case history from another source;  5.Independent review of image, tracing or specimen.

## 2017-10-19 NOTE — Progress Notes (Signed)
Patient's family asked staff not to replace the telemetry at this time because this will "aggrevate" the patient.

## 2017-10-19 NOTE — ED Notes (Signed)
Bed: WA17 Expected date:  Expected time:  Means of arrival:  Comments: Res A 

## 2017-10-19 NOTE — Progress Notes (Addendum)
Family are concerned that the patient could not get Cream of Chicken soup. Clears r/t pt.having colitis. PCP on call was notified. The diet change if any will be addressed by the MD tomorrow. The rest of the breakfast order was placed @ [phone].

## 2017-10-19 NOTE — Progress Notes (Addendum)
Pt admitted earlier with ? stroke after presenting with dysarthria. CT neg and MRI could not be performed due to presence of a PPM. This was decided to be treated conservatively for now. Also, pt here for colitis. Due to mental status, pt was NPO until a SLP eval. Diet is now clear liquids. RN paged because family wanted pt's home meds restarted. NP reviewed chart. Meds held on admission due to NPO status and...1. Given mental status, sedative (psych) meds held, and 2. HTN meds held due to wanting pt to have permissive HTN in presence of ? stroke. Family would not accept RNs explanation. NP spoke to one daughter who is an ED MD. NP explained that given pt's sleepiness (RN told NP that pt awakens briefly but goes right back to sleep) and mental status with dysarthria on admit, NP did not really want to restart sedative meds tonight. Explained reason for holding HTN meds. Daughter wanted NP to come to room and do complete history and physical which has already been done. NP explained that NP is floor/on call person and that NP would be unable to come to bedside for a complete exam due to other obligations. Daughter asked who attending was and NP told her. Daughter inquired if there was an "MD" on call and NP informed daughter that the MD is in the ED admitting and the NP covers the floors. MD is here for NP back up.  Daughter accepted reasoning for no HTN meds, but demanded that Seroquel be restarted tonight. Med needs review on PTA list. Asked pharmacy to get dosage and times straight and let me know.  KJKG, NP [facility] Addendum: Since above, family has been dictating care and not allowing pt to have insulin or other meds or tele because it would "aggravate" the pt. Family wanted pt to get cream of chicken soup, but he is on clear liquids for colitis. Family informed that diet changes will be addressed by the attending.  KJKG, NP [facility]

## 2017-10-19 NOTE — ED Notes (Signed)
Pt has been incontinent 2-3x and has been changed. Has tried to use urinal but fails. Wife states that the pt is continent at home.

## 2017-10-19 NOTE — H&P (Signed)
History and Physical    [name] CZY:[id number] DOB: 04-08-1923 DOA: 10/19/2017  PCP: [name], MD Patient coming from: Home  Chief Complaint: Difficulty speaking and vomiting  HPI: [name] is a 84 y.o. male with medical history significant of prostate cancer status post prostatectomy greater than 20 years ago, coronary artery disease, diabetes mellitus type 2-insulin-dependent, essential hypertension, hyperlipidemia was brought to the hospital for evaluation of difficulty speaking and nonbloody vomiting.  At baseline patient does have dementia and difficulty carrying on his routine tasks but otherwise able to participate in basic conversations.  Per family when he was speaking with 1 of the daughters yesterday evening around 9 PM they noted he was having some dysarthric speech but did not think of had much and patient went to sleep.  Around 1 AM patient woke up nauseous and had one episode of nonbloody vomiting and was still having some speech difficulty therefore wife brought him to the hospital for evaluation.  Per family patient does not have any speech deficit at baseline.  Denies any abdominal pain, fevers, chills, focal neuro deficits or any other complaints.  At times he requires help with ADL S.  Typically he does suffer from constipation and used to take MiraLAX daily but then stopped taking it and only uses senna now as smoothies he has been drinking helps him.  Typically he has bowel movements every other day which are hard but over the course of last week he has been having soft 1 bowel movement every day. Per family his last colonoscopy was "many many years ago" and may have had polyp but no other major issues.  He has never had stroke, any other neurologic or GI symptoms in the past. In the ER his initial labs were overall unremarkable.  Creatinine was slightly elevated at 3.02 but this is not too far from his baseline of 2.8.  Appears slightly dysarthric and had  abdominal discomfort.  In the ER he had CT renal study done which showed high stool burden and signs suggestive of colitis.  He was started on Rocephin and IV Flagyl.  CT of the head was also ordered and he was evaluated by telemetry neurologist.   Review of Systems: As per HPI otherwise 10 point review of systems negative.  Review of Systems Otherwise negative except as per HPI, including: General: Denies fever, chills, night sweats or unintended weight loss. Resp: Denies cough, wheezing, shortness of breath. Cardiac: Denies chest pain, palpitations, orthopnea, paroxysmal nocturnal dyspnea. GI: Denies abdominal pain, nausea, vomiting, diarrhea or constipation GU: Denies dysuria, frequency, hesitancy or incontinence MS: Denies muscle aches, joint pain or swelling Neuro: Denies headache, neurologic deficits (focal weakness, numbness, tingling), abnormal gait Psych: Denies anxiety, depression, SI/HI/AVH Skin: Denies new rashes or lesions ID: Denies sick contacts, exotic exposures, travel  Past Medical History:  Diagnosis Date  . CAD (coronary artery disease)    a. CABG 1991 and 2006 LIMA LAD, seq. SVG D1-Cfx, SVG d2,and seq. SVG AM-PDA   . Chronic systolic CHF (congestive heart failure) ([place])   . CKD (chronic kidney disease)   . Dementia   . Depression   . Diabetes mellitus   . HTN (hypertension)   . Left leg DVT ([place])   . Prostate cancer ([place])   . Tachycardia-bradycardia syndrome [facility])     Past Surgical History:  Procedure Laterality Date  . CORONARY ARTERY BYPASS GRAFT    . PROSTATECTOMY       reports that  has  never smoked. he has never used smokeless tobacco. He reports that he does not drink alcohol or use drugs.  Allergies  Allergen Reactions  . Iodinated Diagnostic Agents Anaphylaxis  . Iohexol Anaphylaxis     Desc: RN states pt stated he had anyphalactic shock reaction to contrast approx 10 years ago.   . Other Shortness Of Breath  . Ace Inhibitors Other (See  Comments)    cough  . Simvastatin Other (See Comments)    Muscle aches  . Aspirin     Other reaction(s): Abdominal Pain  . Iodides Other (See Comments)    Family History  Problem Relation Age of Onset  . Diabetes Mother   . Heart disease Sister   . Heart disease Sister   . Heart disease Sister   . Heart disease Sister      Prior to Admission medications   Medication Sig Start Date End Date Taking? Authorizing Provider  amLODipine (NORVASC) 5 MG tablet Take 1 tablet (5 mg total) by mouth daily. 09/21/16  Yes Rosita Fire, MD  atenolol (TENORMIN) 50 MG tablet Take 50 mg by mouth 2 (two) times daily.    Yes [provider]  buPROPion (WELLBUTRIN) 100 MG tablet Take 1 tablet by mouth 2 (two) times daily.  12/09/14  Yes [provider]  CALCIUM PO Take 1 tablet by mouth 2 (two) times daily.    Yes [provider]  Coenzyme Q10 (COQ10 PO) Take 1 capsule by mouth daily.   Yes [provider]  feeding supplement, GLUCERNA SHAKE, (GLUCERNA SHAKE) LIQD Take 237 mLs by mouth 3 (three) times daily as needed (feeding supplement).   Yes [provider]  Insulin Detemir (LEVEMIR) 100 UNIT/ML Pen Inject 8 Units into the skin daily as needed. For blood glucose per sliding scale. 11/15/15  Yes [provider]  IRON PO Take 1 tablet by mouth daily.   Yes [provider]  isosorbide-hydrALAZINE (BIDIL) 20-37.5 MG tablet Take 1 tablet by mouth 3 (three) times daily. 11/15/15  Yes Rivet, Sindy Guadeloupe, MD  linagliptin (TRADJENTA) 5 MG TABS tablet Take 7.5 mg by mouth daily.   Yes [provider]  Multiple Vitamins-Minerals (ICAPS PO) Take 1 tablet by mouth 2 (two) times daily.   Yes [provider]  Multiple Vitamins-Minerals (MULTIVITAMIN ADULT PO) Take 1 capsule by mouth daily.   Yes [provider]  polyethylene glycol (MIRALAX / GLYCOLAX) packet Take 17 g by mouth daily as needed for moderate constipation.   Yes  [provider]  Probiotic Product (ALIGN PO) Take 1 capsule by mouth daily.   Yes [provider]  SENNA PO Take 1 tablet by mouth daily.   Yes [provider]  sertraline (ZOLOFT) 50 MG tablet Take 100 mg by mouth at bedtime.    Yes [provider]  furosemide (LASIX) 20 MG tablet Take 1 tablet (20 mg total) by mouth daily. Patient not taking: Reported on 10/19/2017 11/15/15   Rivet, Sindy Guadeloupe, MD  Glucose Blood (FREESTYLE LITE TEST VI) USE AS DIRECTED 06/02/13   [provider]    Physical Exam: Vitals:   10/19/17 0545 10/19/17 0548 10/19/17 0600 10/19/17 0630  BP: (!) 176/79   (!) 171/75  Pulse: 60 60 (!) 59 60  Resp:   13 15  Temp:      TempSrc:      SpO2: 96% 97% 98% 96%      Constitutional: NAD, calm, comfortable; AAOx 2 (Name and  place, able to recognize family members), elderly frail appearing.  Vitals:   10/19/17 0545 10/19/17 0548 10/19/17 0600 10/19/17 0630  BP: (!) 176/79   (!) 171/75  Pulse: 60 60 (!) 59 60  Resp:   13 15  Temp:      TempSrc:      SpO2: 96% 97% 98% 96%   Eyes: PERRL, lids and conjunctivae normal ENMT: Mucous membranes are DRY. Posterior pharynx clear of any exudate or lesions.Normal dentition.  Neck: normal, supple, no masses, no thyromegaly Respiratory: clear to auscultation bilaterally, no wheezing, no crackles. Normal respiratory effort. No accessory muscle use.  Cardiovascular: Regular rate and rhythm, no murmurs / rubs / gallops. No extremity edema. 2+ pedal pulses. No carotid bruits. Pacemaker chest wall Abdomen: no tenderness, no masses palpated. No hepatosplenomegaly. Bowel sounds diminished  Musculoskeletal: no clubbing / cyanosis. No joint deformity upper and lower extremities. Good ROM, no contractures. Normal muscle tone.  Skin: no rashes, lesions, ulcers. No induration Neurologic: CN 2-12 grossly intact. No focal neuro deficit besides dysarthria appreciated, Sensation intact, DTR normal.  Strength 5/5 in all 4.  Psychiatric: Somewhat poor judgment and insight.  Normal mood.     Labs on Admission: I have personally reviewed following labs and imaging studies  CBC: Recent Labs  Lab 10/19/17 0212  WBC 6.6  HGB 10.9*  HCT 31.9*  MCV 89.1  PLT 983*   Basic Metabolic Panel: Recent Labs  Lab 10/19/17 0212  NA 141  K 3.5  CL 105  CO2 26  GLUCOSE 228*  BUN 48*  CREATININE 3.02*  CALCIUM 9.6   GFR: CrCl cannot be calculated (Unknown ideal weight.). Liver Function Tests: Recent Labs  Lab 10/19/17 0212  AST 40  ALT 33  ALKPHOS 112  BILITOT 0.3  PROT 6.8  ALBUMIN 3.6   Recent Labs  Lab 10/19/17 0212  LIPASE 92*   No results for input(s): AMMONIA in the last 168 hours. Coagulation Profile: No results for input(s): INR, PROTIME in the last 168 hours. Cardiac Enzymes: No results for input(s): CKTOTAL, CKMB, CKMBINDEX, TROPONINI in the last 168 hours. BNP (last 3 results) No results for input(s): PROBNP in the last 8760 hours. HbA1C: No results for input(s): HGBA1C in the last 72 hours. CBG: No results for input(s): GLUCAP in the last 168 hours. Lipid Profile: No results for input(s): CHOL, HDL, LDLCALC, TRIG, CHOLHDL, LDLDIRECT in the last 72 hours. Thyroid Function Tests: No results for input(s): TSH, T4TOTAL, FREET4, T3FREE, THYROIDAB in the last 72 hours. Anemia Panel: No results for input(s): VITAMINB12, FOLATE, FERRITIN, TIBC, IRON, RETICCTPCT in the last 72 hours. Urine analysis:    Component Value Date/Time   COLORURINE YELLOW 10/19/2017 0243   APPEARANCEUR CLEAR 10/19/2017 0243   LABSPEC 1.009 10/19/2017 0243   PHURINE 7.0 10/19/2017 0243   GLUCOSEU 50 (A) 10/19/2017 0243   HGBUR NEGATIVE 10/19/2017 0243   BILIRUBINUR NEGATIVE 10/19/2017 0243   BILIRUBINUR negative 03/22/2017 1243   KETONESUR NEGATIVE 10/19/2017 0243   PROTEINUR NEGATIVE 10/19/2017 0243   UROBILINOGEN 0.2 03/22/2017 1243   UROBILINOGEN 1.0 09/23/2009 0124    NITRITE NEGATIVE 10/19/2017 0243   LEUKOCYTESUR NEGATIVE 10/19/2017 0243   Sepsis Labs: !!!!!!!!!!!!!!!!!!!!!!!!!!!!!!!!!!!!!!!!!!!! @LABRCNTIP (procalcitonin:4,lacticidven:4) )No results found for this or any previous visit (from the past 240 hour(s)).   Radiological Exams on Admission: Ct Renal Stone Study  Result Date: 10/19/2017 CLINICAL DATA:  Awoke with nausea and vomiting. EXAM: CT ABDOMEN AND PELVIS WITHOUT CONTRAST TECHNIQUE: Multidetector CT imaging of the abdomen  and pelvis was performed following the standard protocol without IV contrast. COMPARISON:  CT 07/06/2015 FINDINGS: Lower chest: Small left pleural effusion, increased from prior exam. Slight progression in cardiomegaly. Dependent airspace opacities in both lungs likely atelectasis. Hepatobiliary: Cyst in the subcapsular right lobe of the liver is not as well-defined on the current exam. No new hepatic lesion. Gallbladder physiologically distended, no calcified stone. No biliary dilatation. Pancreas: No ductal dilatation or inflammation. Spleen: Normal in size without focal abnormality. Adrenals/Urinary Tract: Mild bilateral adrenal thickening without dominant nodule. No hydronephrosis. Mild nonspecific bilateral perinephric edema appears similar to prior exam. Multiple bilateral renal cysts, few of which have thin calcified septa, unchanged from prior exam. No suspicious renal lesion. Urinary bladder mildly distended with wall thickening about the right bladder base. Stomach/Bowel: Colonic wall thickening of the ascending and splenic flexure of the colon. Equivocal wall thickening of the transverse colon which is completely nondistended. Mild adjacent pericolonic fat stranding. Multifocal colonic diverticulosis without diverticulitis. Large distal colonic stool burden with stool distending the rectum, rectal distention of 5.3 cm. Mild rectal wall thickening. No small bowel inflammation or obstruction. Stomach is nondistended.  Vascular/Lymphatic: Dense aortic atherosclerosis without aneurysm. Infrarenal IVC filter in place. No adenopathy. Reproductive: Post prostatectomy. Other: Trace free fluid in the left pelvis. No free air or intra-abdominal abscess. Fat containing umbilical hernia. Musculoskeletal: Decreased bone mineral density with multilevel degenerative change throughout the spine. There are no acute or suspicious osseous abnormalities. IMPRESSION: 1. Colonic wall thickening with mild pericolonic edema of the ascending and splenic flexure of the colon, suggesting colitis. Equivocal involvement of the transverse colon which is nondistended. 2. Large distal colonic stool burden with stool distending the rectum and mild rectal wall thickening. Question fecal impaction. 3. Minimal right bladder wall thickening, recommend correlation with urinalysis to exclude urinary tract infection. 4. Colonic diverticulosis without diverticulitis. 5. Small left pleural effusion. Dependent opacities in the lung bases likely atelectasis. 6.  Aortic Atherosclerosis (ICD10-I70.0). Electronically Signed   By: Jeb Levering M.D.   On: 10/19/2017 04:29    EKG: Independently reviewed. AV paced rhythm  Assessment/Plan Active Problems:   Colitis   Slurred Speech/Dysarthria  -admit forTelemetry monitoring -Stroke protocol; CT head shows-no acute intracranial abnormality, stable cerebral atrophy and chronic microvascular ischemic changes -Allow for permissive hypertension for the first 24-48h - only treat PRN if SBP >220 mmHg. Blood pressures can be gradually normalized to SBP<140 upon discharge. -Unable to get MRI due to his Pacemaker  -Maintain Euthermia.  -ASA ordered - rectally. Discussed with the daughter that we can restart ASA with PPI prior to his discharge (long time ago had non life threatening GI bleed).  -Lipid Panel, A1C -Frequent neuro checks -Atorvastatin PO within 24 hrs if patient agrees and passes speech and swallow (for  some reason he had stopped taking it several years ago) -Risk factor modification -Consult Neurology -PT/OT eval, Speech consult  Nausea and Non bloody Vomiting  Colitis and large colonic Stool Burden  Hx of Constipation - Per daughter her colonoscopy was greater than 15 years ago and thinks was normal, may have had polyp or diverticulosis but does not know the details -Rocephin and Flagyl given in the ER, will start patient on Zosyn -Bowel regimen ordered, will also order glycerin suppository -Provide supportive care  Essential hypertension - Will hold antihypertensives due to permissive hypertension  CKD stage IV -Creatinine is 3.02 which is more or less close to his baseline.  Will closely monitor him, gently hydrate him  Coronary artery disease status post stent and CABG 1991 2006 Hx of Mobitz Type II s/p Dual chamber Pacemaker 2005 with gen change 2011 -Not on any aspirin?.  He also has pacemaker in place - Atenolol is on hold  Diabetes mellitus type 2, insulin-dependent -We will reduce the dose of insulin Lantus to 4 units.  Insulin sliding scale -Currently he is n.p.o. until cleared by speech and swallow then he can get carb modified/cardiac diet  History of depression/anxiety -On sertraline and Wellbutrin  History of dementia -Apparently he requires quite a bit of assistance with his ADL S.   DVT prophylaxis: Early ambulation  Code Status: Full  Family Communication: Wife at bedside and spoke with the daughter over the phone as well.  Disposition Plan:  TBD Consults called: TeleNeuro Admission status: Inpatient admission    Zema Lizardo Arsenio Loader MD [facility] Pager [phone]  If 7PM-7AM, please contact night-coverage [email or web address] Password [facility]  10/19/2017, 8:18 AM

## 2017-10-19 NOTE — Evaluation (Signed)
Clinical/Bedside Swallow Evaluation Patient Details  Name: [name] MRN: [id number] Date of Birth: Sep 28, 1922  Today's Date: 10/19/2017 Time: SLP Start Time (ACUTE ONLY): 1244 SLP Stop Time (ACUTE ONLY): 1307 SLP Time Calculation (min) (ACUTE ONLY): 23 min  Past Medical History:  Past Medical History:  Diagnosis Date  . CAD (coronary artery disease)    a. CABG 1991 and 2006 LIMA LAD, seq. SVG D1-Cfx, SVG d2,and seq. SVG AM-PDA   . Chronic systolic CHF (congestive heart failure) ([place])   . CKD (chronic kidney disease)   . Dementia   . Depression   . Diabetes mellitus   . HTN (hypertension)   . Left leg DVT ([place])   . Prostate cancer ([place])   . Tachycardia-bradycardia syndrome [facility])    Past Surgical History:  Past Surgical History:  Procedure Laterality Date  . CORONARY ARTERY BYPASS GRAFT    . PROSTATECTOMY     HPI:  [name] is a 84 y.o. male with medical history significant of prostate cancer status post prostatectomy greater than 20 years ago, coronary artery disease, diabetes mellitus type 2-insulin-dependent, essential hypertension, hyperlipidemia was brought to the hospital for evaluation of difficulty speaking and nonbloody vomiting.  At baseline patient does have dementia and difficulty carrying on his routine tasks but otherwise able to participate in basic conversations per md note.  Per family when he was speaking with 1 of the daughters yesterday evening around 9 PM they noted he was having some dysarthric speech but did not think of had much and patient went to sleep.  Around 1 AM patient woke up nauseous and had one episode of nonbloody vomiting and was still having some speech difficulty therefore wife brought him to the hospital for evaluation per md note. At times he requires help with ADL.  Pt found to have colitis.  Imaging of head negative.  Pt for speech swallow evaluation.     Assessment / Plan / Recommendation Clinical Impression  Pt presents  with cognitive based dysphagia and also facial nerve involvement evidenced by left facial asymmetry.    Delayed swallow noted - suspect due to oral holding/cognitive deficits - which caregiver Letta Median reports is normal.  No indication of airway compromise nor severe dysphagia with pt intake.  Recommend diet as tolerated - and medicine with puree.  Given pt with colitis - ? clears.  SLP Visit Diagnosis: Dysphagia, oral phase (R13.11)    Aspiration Risk  Mild aspiration risk    Diet Recommendation Thin liquid(? clears secondary to colitis)   Liquid Administration via: Cup;Straw Medication Administration: Whole meds with puree Supervision: Patient able to self feed;Full supervision/cueing for compensatory strategies Compensations: Small sips/bites;Slow rate;Other (Comment)(check for oral pocketing)    Other  Recommendations Oral Care Recommendations: Oral care BID Other Recommendations: Have oral suction available   Follow up Recommendations        Frequency and Duration min 1 x/week  1 week       Prognosis Prognosis for Safe Diet Advancement: Fair Barriers to Reach Goals: Severity of deficits;Cognitive deficits;Behavior      Swallow Study   General Date of Onset: 10/19/17 HPI: [name] is a 84 y.o. male with medical history significant of prostate cancer status post prostatectomy greater than 20 years ago, coronary artery disease, diabetes mellitus type 2-insulin-dependent, essential hypertension, hyperlipidemia was brought to the hospital for evaluation of difficulty speaking and nonbloody vomiting.  At baseline patient does have dementia and difficulty carrying on his routine tasks but  otherwise able to participate in basic conversations per md note.  Per family when he was speaking with 1 of the daughters yesterday evening around 9 PM they noted he was having some dysarthric speech but did not think of had much and patient went to sleep.  Around 1 AM patient woke up nauseous and  had one episode of nonbloody vomiting and was still having some speech difficulty therefore wife brought him to the hospital for evaluation per md note. At times he requires help with ADL.  Pt found to have colitis.  Imaging of head negative.  Pt for speech swallow evaluation.   Type of Study: Bedside Swallow Evaluation Diet Prior to this Study: NPO Temperature Spikes Noted: No Respiratory Status: Room air History of Recent Intubation: No Behavior/Cognition: Alert;Other (Comment)(HOH< only follows commands intermittently) Oral Cavity Assessment: Dry Oral Care Completed by SLP: No Oral Cavity - Dentition: Adequate natural dentition Vision: Functional for self-feeding Self-Feeding Abilities: Needs set up Patient Positioning: Upright in bed Baseline Vocal Quality: Normal Volitional Cough: Weak Volitional Swallow: Unable to elicit    Oral/Motor/Sensory Function Overall Oral Motor/Sensory Function: Mild impairment Facial ROM: Reduced left Facial Symmetry: Abnormal symmetry left Facial Strength: Reduced left Facial Sensation: Reduced left Lingual Symmetry: Within Functional Limits Lingual Strength: Within Functional Limits Lingual Sensation: (dnt) Velum: Within Functional Limits Mandible: Other (Comment)(dnt)   Ice Chips Ice chips: Within functional limits Presentation: Spoon   Thin Liquid Thin Liquid: Impaired Presentation: Cup;Straw;Self Fed Oral Phase Impairments: Poor awareness of bolus Oral Phase Functional Implications: Oral holding Pharyngeal  Phase Impairments: Suspected delayed Swallow    Nectar Thick Nectar Thick Liquid: Not tested   Honey Thick Honey Thick Liquid: Not tested   Puree Puree: Impaired Presentation: Self Fed;Spoon Oral Phase Impairments: Poor awareness of bolus Oral Phase Functional Implications: Prolonged oral transit Pharyngeal Phase Impairments: Suspected delayed Swallow   Solid   GO   Solid: Impaired Presentation: Self Fed Oral Phase Impairments:  Impaired mastication Oral Phase Functional Implications: Impaired mastication;Prolonged oral transit        Macario Golds 10/19/2017,1:25 PM   Luanna Salk, [facility] SLP [phone]

## 2017-10-19 NOTE — Progress Notes (Addendum)
Patient became agitated. Family stated it was because patient did not have Seroquel at 1730.  The family are irate that the Seroquel had not been given this afternoon to the patient.  The PCP on call was notified r/t administration of Seroquel. The PCP on call spoke with the family via the phone and then an order was given for Seroquel.  Patient was given the Seroquel.

## 2017-10-19 NOTE — Evaluation (Signed)
SLP Cancellation Note  Patient Details Name: [name] MRN: [id number] DOB: 1923-02-05   Cancelled treatment:       Reason Eval/Treat Not Completed: Other (comment)(order for speech/language evaluation received, per review of hospitalist note - pt also for swallow evaluation, if this is needed and pt may have po - please order swallow evaluation.  Thanks.)  Otherwise will follow up next date for speech/language evaluation.     Macario Golds 10/19/2017, 11:09 AM  Luanna Salk, [facility] SLP [phone]

## 2017-10-19 NOTE — ED Notes (Signed)
Pt is going for CT of head at present time

## 2017-10-19 NOTE — ED Notes (Signed)
Bed: RESA Expected date:  Expected time:  Means of arrival:  Comments: EMS vomiting

## 2017-10-19 NOTE — ED Triage Notes (Addendum)
Patient arrives by [facility] with complaints of vomiting that started at 0120. Patient lives with his wife-states woke up and started vomiting-EMS administered IM Zofran. Patient actively vomiting.

## 2017-10-19 NOTE — ED Provider Notes (Addendum)
[place] DEPT Provider Note   CSN: [id number] Arrival date & time: 10/19/17  0145     History   Chief Complaint Chief Complaint  Patient presents with  . Emesis    HPI [name] is a 84 y.o. male.  The history is provided by the patient, the spouse and the EMS personnel. History limited by: level 5 due to dementia and HOH.  Emesis   This is a new problem. The current episode started less than 1 hour ago (20 minutes). The problem occurs 2 to 4 times per day. The problem has not changed since onset.The emesis has an appearance of stomach contents. There has been no fever. Pertinent negatives include no abdominal pain, no arthralgias, no chills, no cough, no diarrhea, no fever, no headaches, no myalgias, no sweats and no URI. Risk factors: unknown.    Past Medical History:  Diagnosis Date  . CAD (coronary artery disease)    a. CABG 1991 and 2006 LIMA LAD, seq. SVG D1-Cfx, SVG d2,and seq. SVG AM-PDA   . Chronic systolic CHF (congestive heart failure) ([place])   . CKD (chronic kidney disease)   . Dementia   . Depression   . Diabetes mellitus   . HTN (hypertension)   . Left leg DVT ([place])   . Prostate cancer ([place])   . Tachycardia-bradycardia syndrome [facility])     Patient Active Problem List   Diagnosis Date Noted  . Globus pharyngeus 01/03/2017  . Gastroesophageal reflux disease without esophagitis 01/03/2017  . Palliative care encounter   . Community acquired pneumonia of left lower lobe of lung ([place])   . Left lower lobe pneumonia ([place]) 09/15/2016  . Nausea and vomiting 09/15/2016  . Essential hypertension 09/15/2016  . Troponin level elevated 09/15/2016  . Depression 09/15/2016  . Controlled type 2 diabetes mellitus with diabetic nephropathy, without long-term current use of insulin ([place]) 09/15/2016  . CKD stage 4 due to type 2 diabetes mellitus ([place]) 09/15/2016  . Influenza with pneumonia 09/15/2016  . Acute on chronic combined  systolic and diastolic heart failure ([place]) 09/15/2016  . Bilateral hearing loss 04/27/2016  . ICH (intracerebral hemorrhage) ([place]) 02/09/2016  . Dementia 06/06/2014  . Hyperlipidemia 04/18/2012  . Prostate cancer ([place]) 04/18/2012  . Cognitive deficits 10/07/2011  . Pacemaker     Past Surgical History:  Procedure Laterality Date  . CORONARY ARTERY BYPASS GRAFT    . PROSTATECTOMY         Home Medications    Prior to Admission medications   Medication Sig Start Date End Date Taking? Authorizing Provider  amLODipine (NORVASC) 5 MG tablet Take 1 tablet (5 mg total) by mouth daily. 09/21/16   Rosita Fire, MD  atenolol (TENORMIN) 50 MG tablet Take 50 mg by mouth 2 (two) times daily.     [provider]  buPROPion (WELLBUTRIN) 100 MG tablet Take 1 tablet by mouth 2 (two) times daily.  12/09/14   [provider]  CALCIUM PO Take 1 tablet by mouth 2 (two) times daily.     [provider]  Coenzyme Q10 (COQ10 PO) Take 1 capsule by mouth daily.    [provider]  feeding supplement, GLUCERNA SHAKE, (GLUCERNA SHAKE) LIQD Take 237 mLs by mouth 3 (three) times daily as needed (feeding supplement).    [provider]  furosemide (LASIX) 20 MG tablet Take 1 tablet (20 mg total) by mouth daily. 11/15/15   Rivet, Sindy Guadeloupe, MD  Glucose Blood (FREESTYLE  LITE TEST VI) USE AS DIRECTED 06/02/13   [provider]  Insulin Detemir (LEVEMIR) 100 UNIT/ML Pen Inject 8 Units into the skin daily as needed. For blood glucose per sliding scale. 11/15/15   [provider]  IRON PO Take 1 tablet by mouth daily.    [provider]  isosorbide-hydrALAZINE (BIDIL) 20-37.5 MG tablet Take 1 tablet by mouth 3 (three) times daily. 11/15/15   Rivet, Sindy Guadeloupe, MD  linagliptin (TRADJENTA) 5 MG TABS tablet Take 7.5 mg by mouth daily.    [provider]  Multiple Vitamins-Minerals (ICAPS PO) Take 1 tablet by mouth 2 (two) times daily.     [provider]  Multiple Vitamins-Minerals (MULTIVITAMIN ADULT PO) Take 1 capsule by mouth daily.    [provider]  polyethylene glycol (MIRALAX / GLYCOLAX) packet Take 17 g by mouth daily as needed for moderate constipation.    [provider]  Probiotic Product (ALIGN PO) Take 1 capsule by mouth daily.    [provider]  SENNA PO Take 1 tablet by mouth daily.    [provider]  sertraline (ZOLOFT) 50 MG tablet Take 100 mg by mouth at bedtime.     [provider]    Family History Family History  Problem Relation Age of Onset  . Diabetes Mother   . Heart disease Sister   . Heart disease Sister   . Heart disease Sister   . Heart disease Sister     Social History Social History   Tobacco Use  . Smoking status: Never Smoker  . Smokeless tobacco: Never Used  Substance Use Topics  . Alcohol use: No    Alcohol/week: 0.0 oz  . Drug use: No     Allergies   Iodinated diagnostic agents; Iohexol; Other; Ace inhibitors; Simvastatin; Aspirin; and Iodides   Review of Systems Review of Systems  Constitutional: Negative for chills and fever.  Respiratory: Negative for cough.   Cardiovascular: Negative for chest pain, palpitations and leg swelling.  Gastrointestinal: Positive for vomiting. Negative for abdominal pain and diarrhea.  Musculoskeletal: Negative for arthralgias and myalgias.  Neurological: Negative for headaches.  All other systems reviewed and are negative.    Physical Exam Updated Vital Signs BP (!) 169/78 (BP Location: Left Arm)   Pulse 65   Temp 98.2 F (36.8 C) (Oral)   Resp 12   SpO2 97%   Physical Exam  Constitutional: He is oriented to person, place, and time. He appears well-developed and well-nourished. No distress.  HENT:  Head: Normocephalic and atraumatic.  Mouth/Throat: No oropharyngeal exudate.  Eyes: Conjunctivae are normal. Pupils are equal, round, and reactive to light.  Neck: Normal  range of motion. Neck supple.  Cardiovascular: Normal rate, regular rhythm, normal heart sounds and intact distal pulses.  Pulmonary/Chest: Effort normal and breath sounds normal. No stridor. He has no wheezes. He has no rales.  Abdominal: Soft. Bowel sounds are normal. He exhibits no mass. There is no tenderness. There is no rebound and no guarding.  Musculoskeletal: Normal range of motion.  Neurological: He is alert and oriented to person, place, and time. He displays normal reflexes. He exhibits normal muscle tone. Coordination normal.  Skin: Skin is warm and dry. Capillary refill takes less than 2 seconds.  Psychiatric: He has a normal mood and affect.     ED Treatments / Results  Labs (all labs ordered are listed, but only abnormal results are displayed)  Results for orders placed or performed during  the hospital encounter of 10/19/17  Lipase, blood  Result Value Ref Range   Lipase 92 (H) 11 - 51 U/L  Comprehensive metabolic panel  Result Value Ref Range   Sodium 141 135 - 145 mmol/L   Potassium 3.5 3.5 - 5.1 mmol/L   Chloride 105 101 - 111 mmol/L   CO2 26 22 - 32 mmol/L   Glucose, Bld 228 (H) 65 - 99 mg/dL   BUN 48 (H) 6 - 20 mg/dL   Creatinine, Ser 3.02 (H) 0.61 - 1.24 mg/dL   Calcium 9.6 8.9 - 10.3 mg/dL   Total Protein 6.8 6.5 - 8.1 g/dL   Albumin 3.6 3.5 - 5.0 g/dL   AST 40 15 - 41 U/L   ALT 33 17 - 63 U/L   Alkaline Phosphatase 112 38 - 126 U/L   Total Bilirubin 0.3 0.3 - 1.2 mg/dL   GFR calc non Af Amer 17 (L) >60 mL/min   GFR calc Af Amer 19 (L) >60 mL/min   Anion gap 10 5 - 15  CBC  Result Value Ref Range   WBC 6.6 4.0 - 10.5 K/uL   RBC 3.58 (L) 4.22 - 5.81 MIL/uL   Hemoglobin 10.9 (L) 13.0 - 17.0 g/dL   HCT 31.9 (L) 39.0 - 52.0 %   MCV 89.1 78.0 - 100.0 fL   MCH 30.4 26.0 - 34.0 pg   MCHC 34.2 30.0 - 36.0 g/dL   RDW 13.2 11.5 - 15.5 %   Platelets 144 (L) 150 - 400 K/uL  Urinalysis, Routine w reflex microscopic  Result Value Ref Range   Color, Urine  YELLOW YELLOW   APPearance CLEAR CLEAR   Specific Gravity, Urine 1.009 1.005 - 1.030   pH 7.0 5.0 - 8.0   Glucose, UA 50 (A) NEGATIVE mg/dL   Hgb urine dipstick NEGATIVE NEGATIVE   Bilirubin Urine NEGATIVE NEGATIVE   Ketones, ur NEGATIVE NEGATIVE mg/dL   Protein, ur NEGATIVE NEGATIVE mg/dL   Nitrite NEGATIVE NEGATIVE   Leukocytes, UA NEGATIVE NEGATIVE  I-stat troponin, ED  Result Value Ref Range   Troponin i, poc 0.03 0.00 - 0.08 ng/mL   Comment 3           Ct Renal Stone Study  Result Date: 10/19/2017 CLINICAL DATA:  Awoke with nausea and vomiting. EXAM: CT ABDOMEN AND PELVIS WITHOUT CONTRAST TECHNIQUE: Multidetector CT imaging of the abdomen and pelvis was performed following the standard protocol without IV contrast. COMPARISON:  CT 07/06/2015 FINDINGS: Lower chest: Small left pleural effusion, increased from prior exam. Slight progression in cardiomegaly. Dependent airspace opacities in both lungs likely atelectasis. Hepatobiliary: Cyst in the subcapsular right lobe of the liver is not as well-defined on the current exam. No new hepatic lesion. Gallbladder physiologically distended, no calcified stone. No biliary dilatation. Pancreas: No ductal dilatation or inflammation. Spleen: Normal in size without focal abnormality. Adrenals/Urinary Tract: Mild bilateral adrenal thickening without dominant nodule. No hydronephrosis. Mild nonspecific bilateral perinephric edema appears similar to prior exam. Multiple bilateral renal cysts, few of which have thin calcified septa, unchanged from prior exam. No suspicious renal lesion. Urinary bladder mildly distended with wall thickening about the right bladder base. Stomach/Bowel: Colonic wall thickening of the ascending and splenic flexure of the colon. Equivocal wall thickening of the transverse colon which is completely nondistended. Mild adjacent pericolonic fat stranding. Multifocal colonic diverticulosis without diverticulitis. Large distal colonic  stool burden with stool distending the rectum, rectal distention of 5.3 cm. Mild rectal wall thickening. No  small bowel inflammation or obstruction. Stomach is nondistended. Vascular/Lymphatic: Dense aortic atherosclerosis without aneurysm. Infrarenal IVC filter in place. No adenopathy. Reproductive: Post prostatectomy. Other: Trace free fluid in the left pelvis. No free air or intra-abdominal abscess. Fat containing umbilical hernia. Musculoskeletal: Decreased bone mineral density with multilevel degenerative change throughout the spine. There are no acute or suspicious osseous abnormalities. IMPRESSION: 1. Colonic wall thickening with mild pericolonic edema of the ascending and splenic flexure of the colon, suggesting colitis. Equivocal involvement of the transverse colon which is nondistended. 2. Large distal colonic stool burden with stool distending the rectum and mild rectal wall thickening. Question fecal impaction. 3. Minimal right bladder wall thickening, recommend correlation with urinalysis to exclude urinary tract infection. 4. Colonic diverticulosis without diverticulitis. 5. Small left pleural effusion. Dependent opacities in the lung bases likely atelectasis. 6.  Aortic Atherosclerosis (ICD10-I70.0). Electronically Signed   By: Jeb Levering M.D.   On: 10/19/2017 04:29    EKG  EKG Interpretation  Date/Time:  Wednesday October 19 2017 02:01:30 EDT Ventricular Rate:  76 PR Interval:    QRS Duration: 174 QT Interval:  460 QTC Calculation: 518 R Axis:   -77 Text Interpretation:  Atrial-ventricular dual-paced rhythm No further analysis attempted due to paced rhythm Confirmed by Randal Buba, Jyair Kiraly ([id number]) on 10/19/2017 2:30:43 AM       Procedures Procedures (including critical care time)  Medications Ordered in ED  Medications  milk and molasses enema (not administered)  0.9 %  sodium chloride infusion (not administered)  ondansetron (ZOFRAN) injection 4 mg (4 mg Intravenous Given  10/19/17 0214)  cefTRIAXone (ROCEPHIN) 1 g in sodium chloride 0.9 % 100 mL IVPB (1 g Intravenous New Bag/Given 10/19/17 0531)  metroNIDAZOLE (FLAGYL) IVPB 500 mg (500 mg Intravenous New Bag/Given 10/19/17 5573)    Informed by nurse that patient's wife states he is stuttering now and was several hours PTA.    EDP went to immediately reassess the patient.  Patient is very hard of hearing but is awake, alert and is not stuttering.  Cranial nerves 2-12 were tested and are intact patient is 5/5 x 4 extremities.  Patient is able to tell me his name and birthdate.    The patient is speaking in clear sentences.  Wife is now stating patient is more confused.  EDP ordered CT immediately and consulted tele-neurology.    I suspect the patient's colitis to be the source of the increased confusion his wife is speaking of.  I have communicated this to [facility] who will admit the patient    Final Clinical Impressions(s) / ED Diagnoses   Colitis and impaction: will admit to medicine.  Teleneuro is pending.    Case d/w Dr. Alcario Drought who agrees and believe symptoms are due to colonic infection.        Parke Jandreau, MD 10/19/17 [place], Khyler Eschmann, MD 10/19/17 2202

## 2017-10-19 NOTE — ED Notes (Signed)
ED TO INPATIENT HANDOFF REPORT  Name/Age/Gender [name] 84 y.o. male  Code Status    Code Status Orders  (From admission, onward)        Start     Ordered   10/19/17 0816  Full code  Continuous     10/19/17 0817    Code Status History    Date Active Date Inactive Code Status Order ID Comments User Context   09/15/2016 19:51 09/20/2016 16:05 Full Code [id number]  Robbie Lis, MD ED   02/09/2016 22:03 02/10/2016 15:04 Full Code [id number]  Toy Baker, MD Inpatient   11/12/2015 21:49 11/15/2015 16:58 Full Code [id number]  Norval Gable, MD Inpatient   11/12/2015 16:50 11/12/2015 21:49 Partial Code [id number]  Juliet Rude, MD Inpatient   07/05/2015 16:59 07/07/2015 17:34 Full Code [id number]  Vickii Chafe, MD Inpatient   04/18/2012 19:01 04/20/2012 19:15 Full Code [id number]  Erline Hau, MD Inpatient      Home/SNF/Other Home  Chief Complaint Emesis  Level of Care/Admitting Diagnosis ED Disposition    ED Disposition Condition [place] [facility] Area: [facility] [[id number]]  Level of Care: Telemetry [5]  Admit to tele based on following criteria: Monitor for Ischemic changes  Diagnosis: Colitis [[id number]]  Admitting Physician: Gerlean Ren [facility] [[id number]]  Attending Physician: Gerlean Ren [facility] [[id number]]  Estimated length of stay: past midnight tomorrow  Certification:: I certify this patient will need inpatient services for at least 2 midnights  PT Class (Do Not Modify): Inpatient [101]  PT Acc Code (Do Not Modify): Private [1]       Medical History Past Medical History:  Diagnosis Date  . CAD (coronary artery disease)    a. CABG 1991 and 2006 LIMA LAD, seq. SVG D1-Cfx, SVG d2,and seq. SVG AM-PDA   . Chronic systolic CHF (congestive heart failure) ([place])   . CKD (chronic kidney disease)   . Dementia   . Depression   . Diabetes mellitus   . HTN (hypertension)   . Left leg DVT ([place])   . Prostate cancer  ([place])   . Tachycardia-bradycardia syndrome ([facility])     Allergies Allergies  Allergen Reactions  . Iodinated Diagnostic Agents Anaphylaxis  . Iohexol Anaphylaxis     Desc: RN states pt stated he had anyphalactic shock reaction to contrast approx 10 years ago.   . Other Shortness Of Breath  . Ace Inhibitors Other (See Comments)    cough  . Simvastatin Other (See Comments)    Muscle aches  . Aspirin     Other reaction(s): Abdominal Pain  . Iodides Other (See Comments)    IV Location/Drains/Wounds Patient Lines/Drains/Airways Status   Active Line/Drains/Airways    Name:   Placement date:   Placement time:   Site:   Days:   Peripheral IV 10/19/17 Right;Upper Arm   10/19/17    0213    Arm   less than 1          Labs/Imaging Results for orders placed or performed during the hospital encounter of 10/19/17 (from the past 48 hour(s))  Lipase, blood     Status: Abnormal   Collection Time: 10/19/17  2:12 AM  Result Value Ref Range   Lipase 92 (H) 11 - 51 U/L    Comment: Performed at [facility], [place] [address] [place], [place] [address]  Comprehensive metabolic panel     Status: Abnormal   Collection Time: 10/19/17  2:12 AM  Result Value Ref Range   Sodium 141 135 - 145 mmol/L   Potassium 3.5 3.5 - 5.1 mmol/L   Chloride 105 101 - 111 mmol/L   CO2 26 22 - 32 mmol/L   Glucose, Bld 228 (H) 65 - 99 mg/dL   BUN 48 (H) 6 - 20 mg/dL   Creatinine, Ser 3.02 (H) 0.61 - 1.24 mg/dL   Calcium 9.6 8.9 - 10.3 mg/dL   Total Protein 6.8 6.5 - 8.1 g/dL   Albumin 3.6 3.5 - 5.0 g/dL   AST 40 15 - 41 U/L   ALT 33 17 - 63 U/L   Alkaline Phosphatase 112 38 - 126 U/L   Total Bilirubin 0.3 0.3 - 1.2 mg/dL   GFR calc non Af Amer 17 (L) >60 mL/min   GFR calc Af Amer 19 (L) >60 mL/min    Comment: (NOTE) The eGFR has been calculated using the CKD EPI equation. This calculation has not been validated in all clinical situations. eGFR's persistently <60 mL/min signify possible  Chronic Kidney Disease.    Anion gap 10 5 - 15    Comment: Performed at [facility], [place] [address] [place], [place] [address]  CBC     Status: Abnormal   Collection Time: 10/19/17  2:12 AM  Result Value Ref Range   WBC 6.6 4.0 - 10.5 K/uL   RBC 3.58 (L) 4.22 - 5.81 MIL/uL   Hemoglobin 10.9 (L) 13.0 - 17.0 g/dL   HCT 31.9 (L) 39.0 - 52.0 %   MCV 89.1 78.0 - 100.0 fL   MCH 30.4 26.0 - 34.0 pg   MCHC 34.2 30.0 - 36.0 g/dL   RDW 13.2 11.5 - 15.5 %   Platelets 144 (L) 150 - 400 K/uL    Comment: Performed at [facility], [place] [address], [place], [place] [address]  I-stat troponin, ED     Status: None   Collection Time: 10/19/17  2:34 AM  Result Value Ref Range   Troponin i, poc 0.03 0.00 - 0.08 ng/mL   Comment 3            Comment: Due to the release kinetics of cTnI, a negative result within the first hours of the onset of symptoms does not rule out myocardial infarction with certainty. If myocardial infarction is still suspected, repeat the test at appropriate intervals.   Urinalysis, Routine w reflex microscopic     Status: Abnormal   Collection Time: 10/19/17  2:43 AM  Result Value Ref Range   Color, Urine YELLOW YELLOW   APPearance CLEAR CLEAR   Specific Gravity, Urine 1.009 1.005 - 1.030   pH 7.0 5.0 - 8.0   Glucose, UA 50 (A) NEGATIVE mg/dL   Hgb urine dipstick NEGATIVE NEGATIVE   Bilirubin Urine NEGATIVE NEGATIVE   Ketones, ur NEGATIVE NEGATIVE mg/dL   Protein, ur NEGATIVE NEGATIVE mg/dL   Nitrite NEGATIVE NEGATIVE   Leukocytes, UA NEGATIVE NEGATIVE    Comment: Performed at [place] [address] [place], [place] [address]   Ct Head Wo Contrast  Result Date: 10/19/2017 CLINICAL DATA:  Altered mental status and vomiting. EXAM: CT HEAD WITHOUT CONTRAST TECHNIQUE: Contiguous axial images were obtained from the base of the skull through the vertex without intravenous contrast. COMPARISON:  CT head dated  February 20, 2016. FINDINGS: Brain: No evidence of acute infarction, hemorrhage, hydrocephalus, extra-axial collection or mass lesion/mass effect. Stable atrophy and chronic microvascular ischemic changes. Unchanged lacunar infarct in  the left thalamus. Vascular: Calcified atherosclerosis at the skullbase. No hyperdense vessel. Skull: Normal. Negative for fracture or focal lesion. Sinuses/Orbits: No acute finding. Other: None. IMPRESSION: 1.  No acute intracranial abnormality. 2. Stable cerebral atrophy and chronic microvascular ischemic changes. Electronically Signed   By: Titus Dubin M.D.   On: 10/19/2017 08:24   Ct Renal Stone Study  Result Date: 10/19/2017 CLINICAL DATA:  Awoke with nausea and vomiting. EXAM: CT ABDOMEN AND PELVIS WITHOUT CONTRAST TECHNIQUE: Multidetector CT imaging of the abdomen and pelvis was performed following the standard protocol without IV contrast. COMPARISON:  CT 07/06/2015 FINDINGS: Lower chest: Small left pleural effusion, increased from prior exam. Slight progression in cardiomegaly. Dependent airspace opacities in both lungs likely atelectasis. Hepatobiliary: Cyst in the subcapsular right lobe of the liver is not as well-defined on the current exam. No new hepatic lesion. Gallbladder physiologically distended, no calcified stone. No biliary dilatation. Pancreas: No ductal dilatation or inflammation. Spleen: Normal in size without focal abnormality. Adrenals/Urinary Tract: Mild bilateral adrenal thickening without dominant nodule. No hydronephrosis. Mild nonspecific bilateral perinephric edema appears similar to prior exam. Multiple bilateral renal cysts, few of which have thin calcified septa, unchanged from prior exam. No suspicious renal lesion. Urinary bladder mildly distended with wall thickening about the right bladder base. Stomach/Bowel: Colonic wall thickening of the ascending and splenic flexure of the colon. Equivocal wall thickening of the transverse colon which is  completely nondistended. Mild adjacent pericolonic fat stranding. Multifocal colonic diverticulosis without diverticulitis. Large distal colonic stool burden with stool distending the rectum, rectal distention of 5.3 cm. Mild rectal wall thickening. No small bowel inflammation or obstruction. Stomach is nondistended. Vascular/Lymphatic: Dense aortic atherosclerosis without aneurysm. Infrarenal IVC filter in place. No adenopathy. Reproductive: Post prostatectomy. Other: Trace free fluid in the left pelvis. No free air or intra-abdominal abscess. Fat containing umbilical hernia. Musculoskeletal: Decreased bone mineral density with multilevel degenerative change throughout the spine. There are no acute or suspicious osseous abnormalities. IMPRESSION: 1. Colonic wall thickening with mild pericolonic edema of the ascending and splenic flexure of the colon, suggesting colitis. Equivocal involvement of the transverse colon which is nondistended. 2. Large distal colonic stool burden with stool distending the rectum and mild rectal wall thickening. Question fecal impaction. 3. Minimal right bladder wall thickening, recommend correlation with urinalysis to exclude urinary tract infection. 4. Colonic diverticulosis without diverticulitis. 5. Small left pleural effusion. Dependent opacities in the lung bases likely atelectasis. 6.  Aortic Atherosclerosis (ICD10-I70.0). Electronically Signed   By: Jeb Levering M.D.   On: 10/19/2017 04:29    Pending Labs Unresulted Labs (From admission, onward)   Start     Ordered   10/20/17 0500  Comprehensive metabolic panel  Tomorrow morning,   R     10/19/17 0817   10/20/17 0500  CBC  Tomorrow morning,   R     10/19/17 0817   10/20/17 0500  Protime-INR  Tomorrow morning,   R     10/19/17 0817   10/20/17 0500  APTT  Tomorrow morning,   R     10/19/17 0817   10/19/17 0739  Hemoglobin A1c  Once-Timed,   R     10/19/17 0739   10/19/17 0739  Lipid panel  Once-Timed,   R     Comments:  Fasting    10/19/17 0739      Vitals/Pain Today's Vitals   10/19/17 0545 10/19/17 0548 10/19/17 0600 10/19/17 0630  BP: (!) 176/79   (!) 171/75  Pulse: 60 60 (!) 59 60  Resp:   13 15  Temp:      TempSrc:      SpO2: 96% 97% 98% 96%    Isolation Precautions No active isolations  Medications Medications  0.9 %  sodium chloride infusion (not administered)  bisacodyl (DULCOLAX) suppository 10 mg (not administered)  polyethylene glycol (MIRALAX / GLYCOLAX) packet 17 g (not administered)  docusate sodium (COLACE) capsule 100 mg (not administered)  senna (SENOKOT) tablet 8.6 mg (not administered)   stroke: mapping our early stages of recovery book (not administered)  piperacillin-tazobactam (ZOSYN) IVPB 2.25 g (not administered)  Glycerin (Adult) 2.1 g suppository 1 suppository (not administered)  aspirin suppository 300 mg (not administered)  Insulin Detemir (LEVEMIR) FlexPen 4 Units (not administered)  0.9 %  sodium chloride infusion (not administered)  acetaminophen (TYLENOL) tablet 650 mg (not administered)    Or  acetaminophen (TYLENOL) suppository 650 mg (not administered)  ondansetron (ZOFRAN) tablet 4 mg (not administered)    Or  ondansetron (ZOFRAN) injection 4 mg (not administered)  insulin aspart (novoLOG) injection 0-9 Units (not administered)  ondansetron (ZOFRAN) injection 4 mg (4 mg Intravenous Given 10/19/17 0214)  cefTRIAXone (ROCEPHIN) 1 g in sodium chloride 0.9 % 100 mL IVPB (0 g Intravenous Stopped 10/19/17 0740)  metroNIDAZOLE (FLAGYL) IVPB 500 mg (0 mg Intravenous Stopped 10/19/17 0740)    Mobility walks with person assist

## 2017-10-19 NOTE — Progress Notes (Addendum)
The patient's family spoke with the on call PCP. The family would also like speak with the [facility]. The [facility] has been notified.  The [facility] came to the room and spoke with the family.

## 2017-10-19 NOTE — Progress Notes (Signed)
The patient's family stated to hold off on the administration of Levemir and other night meds.because by giving the meds, this may further agitate the patient.

## 2017-10-20 ENCOUNTER — Telehealth: Payer: Self-pay | Admitting: Physician Assistant

## 2017-10-20 ENCOUNTER — Inpatient Hospital Stay (HOSPITAL_COMMUNITY): Payer: Medicare Other

## 2017-10-20 DIAGNOSIS — K5641 Fecal impaction: Secondary | ICD-10-CM

## 2017-10-20 DIAGNOSIS — F0150 Vascular dementia without behavioral disturbance: Secondary | ICD-10-CM

## 2017-10-20 DIAGNOSIS — I480 Paroxysmal atrial fibrillation: Secondary | ICD-10-CM

## 2017-10-20 DIAGNOSIS — R479 Unspecified speech disturbances: Secondary | ICD-10-CM

## 2017-10-20 DIAGNOSIS — N184 Chronic kidney disease, stage 4 (severe): Secondary | ICD-10-CM

## 2017-10-20 DIAGNOSIS — I5022 Chronic systolic (congestive) heart failure: Secondary | ICD-10-CM

## 2017-10-20 DIAGNOSIS — G92 Toxic encephalopathy: Secondary | ICD-10-CM

## 2017-10-20 LAB — COMPREHENSIVE METABOLIC PANEL
ALK PHOS: 53 U/L (ref 38–126)
ALT: 25 U/L (ref 17–63)
AST: 28 U/L (ref 15–41)
Albumin: 3 g/dL — ABNORMAL LOW (ref 3.5–5.0)
Anion gap: 9 (ref 5–15)
BILIRUBIN TOTAL: 0.7 mg/dL (ref 0.3–1.2)
BUN: 39 mg/dL — ABNORMAL HIGH (ref 6–20)
CALCIUM: 8.8 mg/dL — AB (ref 8.9–10.3)
CO2: 26 mmol/L (ref 22–32)
Chloride: 107 mmol/L (ref 101–111)
Creatinine, Ser: 2.99 mg/dL — ABNORMAL HIGH (ref 0.61–1.24)
GFR, EST AFRICAN AMERICAN: 19 mL/min — AB (ref >60)
GFR, EST NON AFRICAN AMERICAN: 17 mL/min — AB (ref >60)
Glucose, Bld: 183 mg/dL — ABNORMAL HIGH (ref 65–99)
Potassium: 4 mmol/L (ref 3.5–5.1)
Sodium: 142 mmol/L (ref 135–145)
TOTAL PROTEIN: 5.8 g/dL — AB (ref 6.5–8.1)

## 2017-10-20 LAB — GLUCOSE, CAPILLARY
GLUCOSE-CAPILLARY: 163 mg/dL — AB (ref 65–99)
GLUCOSE-CAPILLARY: 174 mg/dL — AB (ref 65–99)
Glucose-Capillary: 180 mg/dL — ABNORMAL HIGH (ref 65–99)
Glucose-Capillary: 193 mg/dL — ABNORMAL HIGH (ref 65–99)
Glucose-Capillary: 84 mg/dL (ref 65–99)
Glucose-Capillary: 186 mg/dL — ABNORMAL HIGH (ref 65–99)

## 2017-10-20 LAB — CBC
HCT: 28.1 % — ABNORMAL LOW (ref 39.0–52.0)
Hemoglobin: 9.7 g/dL — ABNORMAL LOW (ref 13.0–17.0)
MCH: 31 pg (ref 26.0–34.0)
MCHC: 34.5 g/dL (ref 30.0–36.0)
MCV: 89.8 fL (ref 78.0–100.0)
Platelets: 130 10*3/uL — ABNORMAL LOW (ref 150–400)
RBC: 3.13 MIL/uL — AB (ref 4.22–5.81)
RDW: 13.5 % (ref 11.5–15.5)
WBC: 3.8 10*3/uL — AB (ref 4.0–10.5)

## 2017-10-20 LAB — OCCULT BLOOD X 1 CARD TO LAB, STOOL: Fecal Occult Bld: NEGATIVE

## 2017-10-20 LAB — PROTIME-INR
INR: 1.14
PROTHROMBIN TIME: 14.5 s (ref 11.4–15.2)

## 2017-10-20 LAB — APTT: APTT: 30 s (ref 24–36)

## 2017-10-20 MED ORDER — LINAGLIPTIN 5 MG PO TABS: 5.0000 mg | ORAL_TABLET | Freq: Every day | ORAL | Status: DC

## 2017-10-20 MED ORDER — SODIUM CHLORIDE 0.9 % IV SOLN
INTRAVENOUS | Status: AC
  Administered 2017-10-20: 17:00:00 via INTRAVENOUS

## 2017-10-20 MED ORDER — CALCIUM CARBONATE 1250 (500 CA) MG PO TABS
1.0000 | ORAL_TABLET | Freq: Every day | ORAL | Status: DC
  Administered 2017-10-21 – 2017-10-22 (×2): 500 mg via ORAL
  Filled 2017-10-20 (×2): qty 1

## 2017-10-20 MED ORDER — SENNOSIDES-DOCUSATE SODIUM 8.6-50 MG PO TABS
1.0000 | ORAL_TABLET | Freq: Two times a day (BID) | ORAL | Status: DC
  Administered 2017-10-20 – 2017-10-21 (×3): 1 via ORAL
  Filled 2017-10-20 (×4): qty 1

## 2017-10-20 MED ORDER — PANTOPRAZOLE SODIUM 20 MG PO TBEC
20.0000 mg | DELAYED_RELEASE_TABLET | Freq: Every day | ORAL | Status: DC
  Administered 2017-10-20 – 2017-10-22 (×3): 20 mg via ORAL
  Filled 2017-10-20 (×3): qty 1

## 2017-10-20 MED ORDER — FERROUS SULFATE 325 (65 FE) MG PO TABS
325.0000 mg | ORAL_TABLET | Freq: Every day | ORAL | Status: DC
  Administered 2017-10-21 – 2017-10-22 (×2): 325 mg via ORAL
  Filled 2017-10-20 (×2): qty 1

## 2017-10-20 MED ORDER — PROSIGHT PO TABS
1.0000 | ORAL_TABLET | Freq: Every day | ORAL | Status: DC
  Administered 2017-10-21 – 2017-10-22 (×2): 1 via ORAL
  Filled 2017-10-20 (×2): qty 1

## 2017-10-20 MED ORDER — RISAQUAD PO CAPS
1.0000 | ORAL_CAPSULE | Freq: Every day | ORAL | Status: DC
  Administered 2017-10-21 – 2017-10-22 (×2): 1 via ORAL
  Filled 2017-10-20 (×4): qty 1

## 2017-10-20 MED ORDER — CALCIUM 500 MG PO TABS: 600.0000 mg | ORAL_TABLET | Freq: Two times a day (BID) | ORAL | Status: DC

## 2017-10-20 MED ORDER — CARBAMIDE PEROXIDE 6.5 % OT SOLN
5.0000 [drp] | Freq: Two times a day (BID) | OTIC | Status: DC
  Administered 2017-10-20 – 2017-10-22 (×5): 5 [drp] via OTIC
  Filled 2017-10-20: qty 15

## 2017-10-20 MED ORDER — HALOPERIDOL LACTATE 5 MG/ML IJ SOLN
5.0000 mg | Freq: Once | INTRAMUSCULAR | Status: AC
  Administered 2017-10-20: 5 mg via INTRAVENOUS
  Filled 2017-10-20: qty 1

## 2017-10-20 MED ORDER — MIRTAZAPINE 15 MG PO TABS
15.0000 mg | ORAL_TABLET | Freq: Every day | ORAL | Status: DC
  Administered 2017-10-20 – 2017-10-21 (×2): 15 mg via ORAL
  Filled 2017-10-20 (×3): qty 1

## 2017-10-20 MED ORDER — ISOSORB DINITRATE-HYDRALAZINE 20-37.5 MG PO TABS
1.0000 | ORAL_TABLET | Freq: Three times a day (TID) | ORAL | Status: DC
  Administered 2017-10-20 – 2017-10-22 (×5): 1 via ORAL
  Filled 2017-10-20 (×7): qty 1

## 2017-10-20 MED ORDER — LINAGLIPTIN 5 MG PO TABS
5.0000 mg | ORAL_TABLET | Freq: Every day | ORAL | Status: DC
  Administered 2017-10-21 – 2017-10-22 (×2): 5 mg via ORAL
  Filled 2017-10-20 (×2): qty 1

## 2017-10-20 MED ORDER — BISACODYL 10 MG RE SUPP: 10.0000 mg | Freq: Every day | RECTAL | Status: DC | PRN

## 2017-10-20 MED ORDER — SENNA 8.6 MG PO TABS: 1.0000 | ORAL_TABLET | Freq: Every evening | ORAL | Status: DC | PRN

## 2017-10-20 MED ORDER — IRON 325 (65 FE) MG PO TABS: ORAL_TABLET | Freq: Every day | ORAL | Status: DC

## 2017-10-20 MED ORDER — ICAPS PO CAPS: ORAL_CAPSULE | Freq: Two times a day (BID) | ORAL | Status: DC

## 2017-10-20 MED ORDER — GLUCERNA SHAKE PO LIQD
237.0000 mL | ORAL | Status: DC
  Administered 2017-10-20 – 2017-10-22 (×5): 237 mL via ORAL
  Filled 2017-10-20 (×6): qty 237

## 2017-10-20 MED ORDER — MINERAL OIL RE ENEM
1.0000 | ENEMA | Freq: Once | RECTAL | Status: AC
  Administered 2017-10-20: 1 via RECTAL
  Filled 2017-10-20: qty 1

## 2017-10-20 MED ORDER — INSULIN DETEMIR 100 UNIT/ML ~~LOC~~ SOLN: 8.0000 [IU] | Freq: Every day | SUBCUTANEOUS | Status: DC

## 2017-10-20 MED ORDER — ADULT MULTIVITAMIN W/MINERALS CH
ORAL_TABLET | Freq: Every day | ORAL | Status: DC
  Administered 2017-10-21 – 2017-10-22 (×2): 1 via ORAL
  Filled 2017-10-20 (×2): qty 1

## 2017-10-20 MED ORDER — ZIPRASIDONE HCL 20 MG PO CAPS
20.0000 mg | ORAL_CAPSULE | Freq: Two times a day (BID) | ORAL | Status: DC | PRN
  Filled 2017-10-20: qty 1

## 2017-10-20 MED ORDER — SERTRALINE HCL 25 MG PO TABS
25.0000 mg | ORAL_TABLET | Freq: Every day | ORAL | Status: DC
  Administered 2017-10-20 – 2017-10-21 (×2): 25 mg via ORAL
  Filled 2017-10-20 (×3): qty 1

## 2017-10-20 MED ORDER — INSULIN DETEMIR 100 UNIT/ML ~~LOC~~ SOLN
6.0000 [IU] | Freq: Every day | SUBCUTANEOUS | Status: DC
  Administered 2017-10-21: 6 [IU] via SUBCUTANEOUS
  Filled 2017-10-20: qty 0.06

## 2017-10-20 MED ORDER — ATENOLOL 50 MG PO TABS
50.0000 mg | ORAL_TABLET | Freq: Two times a day (BID) | ORAL | Status: DC
  Administered 2017-10-20 – 2017-10-22 (×4): 50 mg via ORAL
  Filled 2017-10-20 (×6): qty 1

## 2017-10-20 MED ORDER — EZETIMIBE 10 MG PO TABS
10.0000 mg | ORAL_TABLET | Freq: Every day | ORAL | Status: DC
  Administered 2017-10-20 – 2017-10-22 (×3): 10 mg via ORAL
  Filled 2017-10-20 (×3): qty 1

## 2017-10-20 NOTE — Consult Note (Signed)
Cardiology Consultation:   Patient ID: [name]; [id number]; 24-Mar-1923   Admit date: 10/19/2017 Date of Consult: 10/20/2017  Primary Care Provider: Seward Carol, MD Primary Cardiologist: Sinclair Grooms, MD  Primary Electrophysiologist:  Dr. Lovena Le   Patient Profile:   [name] is a 84 y.o. male with a hx of CAD who is being seen today to manage medications at the request of Erlinda Hong.  History of Present Illness:   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  The patient does not give any history as he is hard of hearing, trying to sleep and agitated.  His wife gave the history and I reviewed the admission notes.  His symptoms started with N/V.  He became agitated, somehwat confused and had apparently slurred speech.  In the ED CT was negative for any acute changes.  There have been problems with agitation through the night in the hospital.  He has not wanted to wear telemetry.  This morning he is less confused and agitated after receiving Seroquel.  We are called to comment on whether or not he should be on ASA or warfarin.  He has not been on ASA because of previous GI bleed and GI intolerance per his wife.  He has been off of warfarin because of a fall with head trauma and GI bleed.  He lives at home with his wife.  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  He ambulates in the house with a walker.  He has a pacemaker with paced rhythm.  The patient denies any new symptoms such as chest discomfort, neck or arm discomfort. There has been no new shortness of breath, PND or orthopnea. There have been no reported palpitations, presyncope or syncope.   Past Medical History:  Diagnosis Date  . CAD (coronary artery disease)    a. CABG 1991 and 2006 LIMA LAD, seq. SVG D1-Cfx, SVG d2,and seq. SVG  AM-PDA   . Chronic systolic CHF (congestive heart failure) ([place])   . CKD (chronic kidney disease)   . Dementia   . Depression   . Diabetes mellitus   . HTN (hypertension)   . Left leg DVT ([place])   . Prostate cancer ([place])   . Tachycardia-bradycardia syndrome [facility])     Past Surgical History:  Procedure Laterality Date  . CORONARY ARTERY BYPASS GRAFT    . PROSTATECTOMY       Prior to Admission medications   Medication Sig Start Date End Date Taking? Authorizing Provider  acetaminophen (TYLENOL) 500 MG tablet Take 500 mg by mouth every 6 (six) hours as needed.   Yes [provider]  amLODipine (NORVASC) 5 MG tablet Take 1 tablet (5 mg total) by mouth daily. 09/21/16  Yes Rosita Fire, MD  atenolol (TENORMIN) 50 MG tablet Take 50 mg by mouth 2 (two) times daily.    Yes [provider]  CALCIUM PO Take 600 mg by mouth 2 (two) times daily.    Yes [provider]  Coenzyme Q10 (COQ10 PO) Take 1 capsule by mouth daily.   Yes [provider]  feeding supplement, GLUCERNA SHAKE, (GLUCERNA SHAKE) LIQD Take 237 mLs by mouth 3 (three) times daily as needed (feeding supplement).   Yes [provider]  Insulin Detemir (LEVEMIR) 100 UNIT/ML Pen Inject 8 Units  into the skin daily as needed. For blood glucose per sliding scale. 11/15/15  Yes [provider]  IRON PO Take 1 tablet by mouth daily.   Yes [provider]  isosorbide-hydrALAZINE (BIDIL) 20-37.5 MG tablet Take 1 tablet by mouth 3 (three) times daily. 11/15/15  Yes Rivet, Sindy Guadeloupe, MD  linagliptin (TRADJENTA) 5 MG TABS tablet Take 7.5 mg by mouth daily.   Yes [provider]  mirtazapine (REMERON) 15 MG tablet Take 15 mg by mouth at bedtime.   Yes [provider]  Multiple Vitamins-Minerals (ICAPS PO) Take 1 tablet by mouth 2 (two) times daily.   Yes [provider]  Multiple Vitamins-Minerals (MULTIVITAMIN ADULT PO) Take 1 capsule by mouth daily.    Yes [provider]  polyethylene glycol (MIRALAX / GLYCOLAX) packet Take 17 g by mouth daily as needed for moderate constipation.   Yes [provider]  Probiotic Product (ALIGN PO) Take 1 capsule by mouth daily.   Yes [provider]  QUEtiapine (SEROQUEL) 25 MG tablet Take 25 mg by mouth daily as needed.   Yes [provider]  QUEtiapine (SEROQUEL) 50 MG tablet Take 50 mg by mouth daily. At 530 pm   Yes [provider]  SENNA PO Take 1 tablet by mouth daily.   Yes [provider]  sertraline (ZOLOFT) 25 MG tablet Take 25 mg by mouth daily.    Yes [provider]  furosemide (LASIX) 20 MG tablet Take 1 tablet (20 mg total) by mouth daily. Patient not taking: Reported on 10/19/2017 11/15/15   Rivet, Sindy Guadeloupe, MD  Glucose Blood (FREESTYLE LITE TEST VI) USE AS DIRECTED 06/02/13   [provider]    Inpatient Medications: Scheduled Meds: . aspirin  300 mg Rectal Once  . carbamide peroxide  5 drop Both EARS BID  . insulin aspart  0-9 Units Subcutaneous Q4H  . insulin detemir  4 Units Subcutaneous Q2200  . mouth rinse  15 mL Mouth Rinse BID  . QUEtiapine  50 mg Oral Q24H   Continuous Infusions: . sodium chloride 125 mL/hr at 10/19/17 2131  . piperacillin-tazobactam (ZOSYN)  IV 2.25 g (10/20/17 1238)   PRN Meds: acetaminophen **OR** acetaminophen, bisacodyl, docusate sodium, Glycerin (Adult), ondansetron **OR** ondansetron (ZOFRAN) IV, QUEtiapine, senna  Allergies:    Allergies  Allergen Reactions  . Iodides Anaphylaxis and Other (See Comments)  . Iodinated Diagnostic Agents Anaphylaxis  . Iohexol Anaphylaxis     Desc: RN states pt stated he had anyphalactic shock reaction to contrast approx 10 years ago.   . Other Shortness Of Breath  . Ace Inhibitors Other (See Comments)    cough  . Simvastatin Other (See Comments)    Muscle aches  . Aspirin     Other reaction(s): Abdominal Pain    Social History:   Social  History   Socioeconomic History  . Marital status: Married    Spouse name: Not on file  . Number of children: Not on file  . Years of education: Not on file  . Highest education level: Not on file  Occupational History  . Not on file  Social Needs  . Financial resource strain: Not on file  . Food insecurity:    Worry: Not on file    Inability: Not on file  . Transportation needs:    Medical: Not on file    Non-medical: Not on file  Tobacco Use  . Smoking status: Never Smoker  . Smokeless tobacco: Never  Used  Substance and Sexual Activity  . Alcohol use: No    Alcohol/week: 0.0 oz  . Drug use: No  . Sexual activity: Not on file  Lifestyle  . Physical activity:    Days per week: Not on file    Minutes per session: Not on file  . Stress: Not on file  Relationships  . Social connections:    Talks on phone: Not on file    Gets together: Not on file    Attends religious service: Not on file    Active member of club or organization: Not on file    Attends meetings of clubs or organizations: Not on file    Relationship status: Not on file  . Intimate partner violence:    Fear of current or ex partner: Not on file    Emotionally abused: Not on file    Physically abused: Not on file    Forced sexual activity: Not on file  Other Topics Concern  . Not on file  Social History Narrative  . Not on file    Family History:    Family History  Problem Relation Age of Onset  . Diabetes Mother   . Heart disease Sister   . Heart disease Sister   . Heart disease Sister   . Heart disease Sister      ROS:  Please see the history of present illness.  ROS  Unable to obtain from the patient.    Physical Exam/Data:   Vitals:   10/19/17 2000 10/20/17 0359 10/20/17 0414 10/20/17 1317  BP: (!) 137/49  120/83 (!) 160/73  Pulse: 63  89 63  Resp: 20  18 19   Temp: 98.7 F (37.1 C)  98.3 F (36.8 C) 97.7 F (36.5 C)  TempSrc: Oral  Axillary Axillary  SpO2: 100%  100% 100%    Weight:  152 lb 1.9 oz (69 kg)    Height:  5\' 11"  (1.803 m)      Intake/Output Summary (Last 24 hours) at 10/20/2017 1319 Last data filed at 10/20/2017 0619 Gross per 24 hour  Intake 3087.08 ml  Output -  Net 3087.08 ml   Filed Weights   10/19/17 1008 10/20/17 0359  Weight: 150 lb 5.7 oz (68.2 kg) 152 lb 1.9 oz (69 kg)   Body mass index is 21.22 kg/m.  GENERAL:  Well appearing for his age 68:   Pupils equal round and reactive, fundi not visualized, oral mucosa unremarkable NECK:  No  jugular venous distention, waveform within normal limits, carotid upstroke brisk and symmetric, no bruits, no thyromegaly LYMPHATICS:  No cervical, inguinal adenopathy LUNGS:   Clear to auscultation bilaterally BACK:  No CVA tenderness CHEST:   Well healed pacemaker and CABG scar. HEART:  PMI not displaced or sustained,S1 and S2 within normal limits, no S3, no S4, no clicks, no rubs,  murmurs ABD:  Flat, positive bowel sounds normal in frequency in pitch, no bruits, no rebound, no guarding, no midline pulsatile mass, no hepatomegaly, no splenomegaly EXT:  2 plus pulses throughout, no  edema, no cyanosis no clubbing SKIN:  No rashes no nodules NEURO:   Cranial nerves II through XII grossly intact, motor grossly intact throughout PSYCH:    Cognitively intact, oriented to person place and time   EKG:  The EKG was personally reviewed and demonstrates:  AV paced rhythm Telemetry:  Telemetry was personally reviewed and demonstrates:  AV paced rhythm  Relevant CV Studies:  Echo:  09/16/17  Study Conclusions  -  Left ventricle: The cavity size was normal. Wall thickness was   normal. Systolic function was moderately to severely reduced. The   estimated ejection fraction was in the range of 30% to 35%.   Moderate diffuse hypokinesis with distinct regional wall motion   abnormalities. Severe hypokinesis of the entireinferolateral   myocardium; consistent with infarction in the distribution of the    left circumflex coronary artery. Features are consistent with a   pseudonormal left ventricular filling pattern, with concomitant   abnormal relaxation and increased filling pressure (grade 2   diastolic dysfunction). - Ventricular septum: Septal motion showed abnormal function,   dyssynergy, and paradox. These changes are consistent with right   ventricular pacing. - Aortic valve: There was mild regurgitation. - Mitral valve: There was mild to moderate regurgitation directed   centrally. - Left atrium: The atrium was mildly dilated. - Right ventricle: Systolic function was mildly to moderately   reduced. - Right atrium: The atrium was mildly dilated. - Tricuspid valve: There was moderate regurgitation. - Pulmonary arteries: Systolic pressure was moderately increased.   PA peak pressure: 59 mm Hg (S).   Laboratory Data:  Chemistry Recent Labs  Lab 10/19/17 0212 10/20/17 0458  NA 141 142  K 3.5 4.0  CL 105 107  CO2 26 26  GLUCOSE 228* 183*  BUN 48* 39*  CREATININE 3.02* 2.99*  CALCIUM 9.6 8.8*  GFRNONAA 17* 17*  GFRAA 19* 19*  ANIONGAP 10 9    Recent Labs  Lab 10/19/17 0212 10/20/17 0458  PROT 6.8 5.8*  ALBUMIN 3.6 3.0*  AST 40 28  ALT 33 25  ALKPHOS 112 53  BILITOT 0.3 0.7   Hematology Recent Labs  Lab 10/19/17 0212 10/20/17 0458  WBC 6.6 3.8*  RBC 3.58* 3.13*  HGB 10.9* 9.7*  HCT 31.9* 28.1*  MCV 89.1 89.8  MCH 30.4 31.0  MCHC 34.2 34.5  RDW 13.2 13.5  PLT 144* 130*   Cardiac EnzymesNo results for input(s): TROPONINI in the last 168 hours.  Recent Labs  Lab 10/19/17 0234  TROPIPOC 0.03    BNPNo results for input(s): BNP, PROBNP in the last 168 hours.  DDimer No results for input(s): DDIMER in the last 168 hours.  Radiology/Studies:  Ct Head Wo Contrast  Result Date: 10/19/2017 CLINICAL DATA:  Altered mental status and vomiting. EXAM: CT HEAD WITHOUT CONTRAST TECHNIQUE: Contiguous axial images were obtained from the base of the skull  through the vertex without intravenous contrast. COMPARISON:  CT head dated February 20, 2016. FINDINGS: Brain: No evidence of acute infarction, hemorrhage, hydrocephalus, extra-axial collection or mass lesion/mass effect. Stable atrophy and chronic microvascular ischemic changes. Unchanged lacunar infarct in the left thalamus. Vascular: Calcified atherosclerosis at the skullbase. No hyperdense vessel. Skull: Normal. Negative for fracture or focal lesion. Sinuses/Orbits: No acute finding. Other: None. IMPRESSION: 1.  No acute intracranial abnormality. 2. Stable cerebral atrophy and chronic microvascular ischemic changes. Electronically Signed   By: Titus Dubin M.D.   On: 10/19/2017 08:24   Ct Renal Stone Study  Result Date: 10/19/2017 CLINICAL DATA:  Awoke with nausea and vomiting. EXAM: CT ABDOMEN AND PELVIS WITHOUT CONTRAST TECHNIQUE: Multidetector CT imaging of the abdomen and pelvis was performed following the standard protocol without IV contrast. COMPARISON:  CT 07/06/2015 FINDINGS: Lower chest: Small left pleural effusion, increased from prior exam. Slight progression in cardiomegaly. Dependent airspace opacities in both lungs likely atelectasis. Hepatobiliary: Cyst in the subcapsular right lobe of the liver is not as well-defined on  the current exam. No new hepatic lesion. Gallbladder physiologically distended, no calcified stone. No biliary dilatation. Pancreas: No ductal dilatation or inflammation. Spleen: Normal in size without focal abnormality. Adrenals/Urinary Tract: Mild bilateral adrenal thickening without dominant nodule. No hydronephrosis. Mild nonspecific bilateral perinephric edema appears similar to prior exam. Multiple bilateral renal cysts, few of which have thin calcified septa, unchanged from prior exam. No suspicious renal lesion. Urinary bladder mildly distended with wall thickening about the right bladder base. Stomach/Bowel: Colonic wall thickening of the ascending and splenic  flexure of the colon. Equivocal wall thickening of the transverse colon which is completely nondistended. Mild adjacent pericolonic fat stranding. Multifocal colonic diverticulosis without diverticulitis. Large distal colonic stool burden with stool distending the rectum, rectal distention of 5.3 cm. Mild rectal wall thickening. No small bowel inflammation or obstruction. Stomach is nondistended. Vascular/Lymphatic: Dense aortic atherosclerosis without aneurysm. Infrarenal IVC filter in place. No adenopathy. Reproductive: Post prostatectomy. Other: Trace free fluid in the left pelvis. No free air or intra-abdominal abscess. Fat containing umbilical hernia. Musculoskeletal: Decreased bone mineral density with multilevel degenerative change throughout the spine. There are no acute or suspicious osseous abnormalities. IMPRESSION: 1. Colonic wall thickening with mild pericolonic edema of the ascending and splenic flexure of the colon, suggesting colitis. Equivocal involvement of the transverse colon which is nondistended. 2. Large distal colonic stool burden with stool distending the rectum and mild rectal wall thickening. Question fecal impaction. 3. Minimal right bladder wall thickening, recommend correlation with urinalysis to exclude urinary tract infection. 4. Colonic diverticulosis without diverticulitis. 5. Small left pleural effusion. Dependent opacities in the lung bases likely atelectasis. 6.  Aortic Atherosclerosis (ICD10-I70.0). Electronically Signed   By: Jeb Levering M.D.   On: 10/19/2017 04:29    Assessment and Plan:   PAF:  The patient has a history of PAF.  He has had a low burden of this on previous device check.  However, still Mr. [name] has a CHA2DS2 - VASc score of 5.  Despite this high risk, he has previously been deemed by Dr. Lovena Le and Dr. Tamala Julian not to be a candidate for DOAC with his history of GIB and head trauma/fall with previous intraparenchymal bleed. .   There are no  data to suggest that ASA will reduce the risk of embolic CVA in this situation particularly in the age greater than 29.   Given the careful consideration by his physicians in the past not to use warfarin, DOAC or ASA and the increased risk I would continue without anticoagulation and I will discontinue ASA.  The family requests that the device be interrogated and I will arrange this.    HTN:  BP is labile but he did not take his home meds.  Continue with previous therapy.    CAD:  Continue medical management.      CARDIOMYOPATHY:  EF 30 - 35% on echo in Feb 2018.  This was noted to be lower in Feb than previous but Dr. Tamala Julian indicated at that time that no further work up was suggested given his advanced age and comorbid conditions.  I agree with this.  He seems to be euvolemic.      For questions or updates, please contact [place] Please consult [email or web address] for contact info under Cardiology/STEMI.   Signed, Minus Breeding, MD  10/20/2017 1:19 PM \

## 2017-10-20 NOTE — Progress Notes (Signed)
Spoke with pt's wife at bedside concerning [place]. Pt have private sitters, will continue to follow for any other HH needs.
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
## 2017-10-20 NOTE — Telephone Encounter (Signed)
Spoke with Marcia/[employer] for ppm interrogation. Raeden Schippers PA-C

## 2017-10-20 NOTE — Care Management Note (Signed)
Case Management Note  Patient Details  Name: [name] MRN: [id number] Date of Birth: [birth year]  Subjective/Objective:                    Action/Plan:   Expected Discharge Date:  (UNKNOWN)               Expected Discharge Plan:  [place]  In-House Referral:     Discharge planning Services  CM Consult  Post Acute Care Choice:    Choice offered to:  Spouse  DME Arranged:    DME Agency:     HH Arranged:    HH Agency:     Status of Service:     If discussed at [employer] of Stay Meetings, dates discussed:    Additional CommentsPurcell Mouton, RN 10/20/2017, 2:36 PM

## 2017-10-20 NOTE — Progress Notes (Signed)
PROGRESS NOTE  [name] VZS:[id number] DOB: 04/05/1923 DOA: 10/19/2017 PCP: Seward Carol, MD  HPI/Recap of past 24 hours:  Patient is sleeping now, per family , he is close to baseline now No  Vomiting since admitted to the hospital No fever, he denies pain  He received suppository, family requested enema, family requested a GI consult, family requested neurology consult And cardiology consult  Family requests to have pacemaker  investigated  Family request to have PSA checked  Family request patient's  Ear wax need to be cleaned out    Assessment/Plan: Active Problems:   Colitis  Colitis, constipation -Presented with vomiting, he denies of pain, no fever, no leukocytosis -Started on Zosyn since admission, sitter changed to Augmentin if clinically improving, stool regimen. -Advance diet as tolerated -family requested GI consult, as discussed with [facility] Dr. Paulita Fujita who will see patient in consult.  CT ab "1. Colonic wall thickening with mild pericolonic edema of the ascending and splenic flexure of the colon, suggesting colitis. Equivocal involvement of the transverse colon which is nondistended. 2. Large distal colonic stool burden with stool distending the rectum and mild rectal wall thickening. Question fecal impaction. 3. Minimal right bladder wall thickening, recommend correlation with urinalysis to exclude urinary tract infection. 4. Colonic diverticulosis without diverticulitis. 5. Small left pleural effusion. Dependent opacities in the lung bases likely atelectasis. 6.  Aortic Atherosclerosis (ICD10-I70.0)."  Dysarthria -Symptomatic resolved, CT had no acute findings.  Not able to get MRI due to pacemaker -Family requested neurology consult for TIA stroke workup -neurology input appreciated  Hypertension -Blood pressure medication held initially due to concerning for TIA , allow for permissive hypertension -Gradually restart home BP  meds  History of A. Fib, second-degree heart block, history of chronic systolic heart failure Status post pacemaker placement Euvolemic dry on exam, may report patient has not been on Lasix for a year. Paced rhythm in the hospital Patient denies chest pain Family request cardiology consult and pacemaker investigation Cardiology consulted, input appreciated  Normocytic anemia -Hemoglobin baseline fluctuating from 9-12 since 2017 -Fecal occult study negative -Repeat CBC in a.m.  Insulin-dependent type 2 diabetes A1c 7.4 He is on Levemir at home only -Resume Levemir and reduced dose while he is in the hospital, he is on SSI -Continue home meds Tradjenta   CKDIV -No function appears baseline  Ear wax,  Debrox dropped ordered, daughter stats it is not going to be enough,   History of prostate cancer, family report he is getting  injections by urology for prostate cancer treatment Family request to check PSA  Dementia, history of delirium requiring hospitalization for 10 days in January 2019 Per family he was taken off Wellbutrin, he was started on Seroquel.  Family was not happy that patient did not receive Seroquel yesterday.  Per family patient gets Seroquel daily at 5:30 PM , this is ordered Per family he did request Geodon in the past while he had delirium I have discussed with family, hopefully early discharge to avoid in-hospital delirium  Code Status: full  Family Communication: patient , wife at bedsides, two daughters over the phone   Disposition Plan: home once consultant clears patient    Consultants:  Neurology  Cardiology  [facility]  Procedures:  none  Antibiotics:  zosyn   Objective: BP (!) 160/73 (BP Location: Left Arm)   Pulse 63   Temp 97.7 F (36.5 C) (Axillary)   Resp 19   Ht 5\' 11"  (1.803 m)  Wt 69 kg (152 lb 1.9 oz)   SpO2 100%   BMI 21.22 kg/m   Intake/Output Summary (Last 24 hours) at 10/20/2017 1430 Last data filed at  10/20/2017 1025 Gross per 24 hour  Intake 3087.08 ml  Output -  Net 3087.08 ml   Filed Weights   10/19/17 1008 10/20/17 0359  Weight: 68.2 kg (150 lb 5.7 oz) 69 kg (152 lb 1.9 oz)    Exam: Patient is examined daily including today on 10/20/2017, exams remain the same as of yesterday except that has changed    General:  Demented, NAD, hard of hearing  Cardiovascular: pace rhythm  Respiratory: CTABL  Abdomen: Soft/ND/NT, positive BS  Musculoskeletal: No Edema  Neuro: alert, oriented to self  Data Reviewed: Basic Metabolic Panel: Recent Labs  Lab 10/19/17 0212 10/20/17 0458  NA 141 142  K 3.5 4.0  CL 105 107  CO2 26 26  GLUCOSE 228* 183*  BUN 48* 39*  CREATININE 3.02* 2.99*  CALCIUM 9.6 8.8*   Liver Function Tests: Recent Labs  Lab 10/19/17 0212 10/20/17 0458  AST 40 28  ALT 33 25  ALKPHOS 112 53  BILITOT 0.3 0.7  PROT 6.8 5.8*  ALBUMIN 3.6 3.0*   Recent Labs  Lab 10/19/17 0212  LIPASE 92*   No results for input(s): AMMONIA in the last 168 hours. CBC: Recent Labs  Lab 10/19/17 0212 10/20/17 0458  WBC 6.6 3.8*  HGB 10.9* 9.7*  HCT 31.9* 28.1*  MCV 89.1 89.8  PLT 144* 130*   Cardiac Enzymes:   No results for input(s): CKTOTAL, CKMB, CKMBINDEX, TROPONINI in the last 168 hours. BNP (last 3 results) No results for input(s): BNP in the last 8760 hours.  ProBNP (last 3 results) No results for input(s): PROBNP in the last 8760 hours.  CBG: Recent Labs  Lab 10/19/17 1218 10/19/17 1748 10/20/17 0425 10/20/17 0740 10/20/17 1131  GLUCAP 210* 192* 163* 180* 193*    No results found for this or any previous visit (from the past 240 hour(s)).   Studies: No results found.  Scheduled Meds: . [START ON 10/21/2017] acidophilus  1 capsule Oral Daily  . aspirin  300 mg Rectal Once  . atenolol  50 mg Oral BID  . carbamide peroxide  5 drop Both EARS BID  . ezetimibe  10 mg Oral Daily  . feeding supplement (GLUCERNA SHAKE)  237 mL Oral 3 times  per day  . insulin aspart  0-9 Units Subcutaneous Q4H  . [START ON 10/21/2017] insulin detemir  8 Units Subcutaneous Daily  . [START ON 10/21/2017] linagliptin  5 mg Oral Daily  . mouth rinse  15 mL Mouth Rinse BID  . mineral oil  1 enema Rectal Once  . mirtazapine  15 mg Oral QHS  . [START ON 10/21/2017] multivitamin with minerals   Oral Daily  . pantoprazole  20 mg Oral Daily  . QUEtiapine  50 mg Oral Q24H  . senna-docusate  1 tablet Oral BID  . sertraline  25 mg Oral QHS    Continuous Infusions: . sodium chloride 125 mL/hr at 10/19/17 2131  . piperacillin-tazobactam (ZOSYN)  IV Stopped (10/20/17 1341)     Time spent: more then 2hrs , more than 50% time spent on coordination of care case discussed with cardiology, neurology, [facility] per family requests I have personally reviewed and interpreted on  10/20/2017 daily labs, tele strips, imagings as discussed above under date review session and assessment and plans.  I reviewed  all nursing notes, pharmacy notes, consultant notes,  vitals, pertinent old records  I have discussed plan of care as described above with RN , patient and family on 10/20/2017   Florencia Reasons MD, PhD  [facility] Pager [phone]. If 7PM-7AM, please contact night-coverage at [email or web address], password [facility] 10/20/2017, 2:30 PM  LOS: 1 day

## 2017-10-20 NOTE — Evaluation (Signed)
Physical Therapy Evaluation Patient Details Name: [name] MRN: [id number] DOB: 17-Mar-1923 Today's Date: 10/20/2017   History of Present Illness  [name] is a 84 y.o. male with medical history significant of prostate cancer status post prostatectomy greater than 20 years ago, coronary artery disease, diabetes mellitus type 2-insulin-dependent, essential hypertension, hyperlipidemia was brought to the hospital for evaluation of difficulty speaking and nonbloody vomiting.  At baseline patient does have dementia and difficulty carrying on his routine tasks but otherwise able to participate in basic conversations.  Per family when he was speaking with 1 of the daughters yesterday evening around 9 PM they noted he was having some dysarthric speech but did not think of had much and patient went to sleep.  Around 1 AM patient woke up nauseous and had one episode of nonbloody vomiting and was still having some speech difficulty therefore wife brought him to the hospital for evaluation.  Per family patient does not have any speech deficit at baseline.  Denies any abdominal pain, fevers, chills, focal neuro deficits or any other complaints.  At times he requires help with ADL S. Admitted with symptoms of colitis.    Clinical Impression  Patient is 84 year old whose cognition appears to be improved today. He was able to ambulated with RW and min guard. Transfers with min assist to min guard. Exhibited decreased strength, balance, endurance, coordination, transfer and gait skills.  Patient would continue to benefit from skilled physical therapy in current environment and next venue to continue return to prior function and increase strength, endurance, balance, coordination, and functional mobility and gait skills.     Follow Up Recommendations Home health PT;Supervision/Assistance - 24 hour    Equipment Recommendations  Rolling walker with 5" wheels(unable to speak with family regarding DME  available at home; if pt does not have a RW at home already, he would benefit from one.)    Recommendations for Other Services       Precautions / Restrictions Precautions Precautions: Fall Restrictions Weight Bearing Restrictions: No      Mobility  Bed Mobility Overal bed mobility: Needs Assistance Bed Mobility: Supine to Sit     Supine to sit: Supervision        Transfers Overall transfer level: Needs assistance Equipment used: Rolling walker (2 wheeled) Transfers: Sit to/from [facility] Sit to Stand: Min guard Stand pivot transfers: Min guard          Ambulation/Gait Ambulation/Gait assistance: Min guard Ambulation Distance (Feet): 25 Feet Assistive device: Rolling walker (2 wheeled) Gait Pattern/deviations: Step-through pattern;Decreased step length - right;Decreased step length - left;Decreased stride length;Trunk flexed   Gait velocity interpretation: Below normal speed for age/gender General Gait Details: slow, often pauses as if to make sure safe to proceed, likely due to [employer] Mobility    Modified Rankin (Stroke Patients Only)       Balance Overall balance assessment: Needs assistance Sitting-balance support: Bilateral upper extremity supported Sitting balance-Leahy Scale: Fair     Standing balance support: Bilateral upper extremity supported;During functional activity Standing balance-Leahy Scale: Fair                               Pertinent Vitals/Pain Pain Assessment: No/denies pain    Home Living Family/patient expects to be discharged to:: Unsure Living Arrangements: Spouse/significant other  Additional Comments: Pt is very HOH. Chart review stated patient has caregivers daily from an outside company a year ago. Unsure of hours/day or if still receiving this service at home. Sitter from outside company in room during PT session.    Prior Function Level  of Independence: Needs assistance   Gait / Transfers Assistance Needed: mod I per family a year ago  ADL's / 76 Assistance Needed: family assists with ADL tasks per family a year ago  Comments: Pt is a retired Careers adviser.     Journalist, newspaper        Extremity/Trunk Assessment   Upper Extremity Assessment Upper Extremity Assessment: Generalized weakness    Lower Extremity Assessment Lower Extremity Assessment: Generalized weakness       Communication   Communication: HOH(seems to hear best in Rt ear)  Cognition     Overall Cognitive Status: No family/caregiver present to determine baseline cognitive functioning                                 General Comments: History of cognitive impairments - unsure if now at baseline.      General Comments      Exercises     Assessment/Plan    PT Assessment Patient needs continued PT services  PT Problem List Decreased strength;Decreased mobility;Decreased safety awareness;Decreased coordination;Decreased activity tolerance;Decreased cognition;Decreased balance       PT Treatment Interventions Therapeutic activities;Gait training;Therapeutic exercise;Patient/family education;Balance training;Functional mobility training;Neuromuscular re-education    PT Goals (Current goals can be found in the Care Plan section)  Acute Rehab PT Goals Patient Stated Goal: go home PT Goal Formulation: With patient Time For Goal Achievement: 11/03/17 Potential to Achieve Goals: Good    Frequency Min 2X/week   Barriers to discharge        Co-evaluation               AM-PAC PT "6 Clicks" Daily Activity  Outcome Measure Difficulty turning over in bed (including adjusting bedclothes, sheets and blankets)?: None Difficulty moving from lying on back to sitting on the side of the bed? : A Little Difficulty sitting down on and standing up from a chair with arms (e.g., wheelchair, bedside commode, etc,.)?: A  Little Help needed moving to and from a bed to chair (including a wheelchair)?: A Little Help needed walking in hospital room?: A Little Help needed climbing 3-5 steps with a railing? : A Little 6 Click Score: 19    End of Session Equipment Utilized During Treatment: Gait belt Activity Tolerance: Patient tolerated treatment well Patient left: in chair;with nursing/sitter in room;with chair alarm set Nurse Communication: Mobility status PT Visit Diagnosis: Unsteadiness on feet (R26.81);Other abnormalities of gait and mobility (R26.89);Muscle weakness (generalized) (M62.81)    Time: 2010-0712 PT Time Calculation (min) (ACUTE ONLY): 38 min   Charges:   PT Evaluation $PT Eval Moderate Complexity: 1 Mod PT Treatments $Therapeutic Activity: 8-22 mins   PT G Codes:        Ritamarie Arkin D. Hartnett-Rands, MS, PT Per [place] [phone] 10/20/2017, 10:13 AM

## 2017-10-20 NOTE — Consult Note (Signed)
Neurology Consultation  Reason for Consult: Concern for stroke, dysarthria, confusion Referring Physician: Dr. Erlinda Hong  CC: Confusion, dysarthria  History is obtained from: Chart, patient's daughter Dr. Eldridge Abrahams over the phone and patient's wife at bedside  HPI: [name] is a 84 y.o. male past medical history of coronary artery disease, dementia, chronic kidney disease, atrial fibrillation status post pacemaker, depression, diabetes, hypertension, who was brought into the emergency room on 10/19/2017 with complaints of vomiting followed by slurred speech.  He was found to have colitis and severe constipation and his GI symptoms explained his acute change in mental status and vomiting.  At some point, because of the history of dysarthria, family was informed that he will be receiving stroke workup.  Emergency room had obtained telemetry neurology consultation, that deemed based on the exam which was nonfocal, the patient's presentation to be consistent with encephalopathy and deferred further management per the primary team. Neurological consultation was obtained today on the insistence of family, who were concerned about stroke given the fact that he has a history of having had an [place] while he was on Coumadin and this acute change in his mentation at the time of presentation. Upon my conversation with his daughter over the phone, she reported that the patient at the time of presentation did have slurred speech and was dysarthric.  The speech change was very abrupt and very far from his baseline which was also confirmed by his wife who he lives with at home. At baseline, he has moderate dementia and requires help with all the activities of daily living.  He does carry out conversations with family.  In terms of his day-to-day activities, he can recognize faces and does not remember names or who people are. He got a noncontrast CT of the head in the emergency room, which was negative for any acute  change of bleed and showed chronic white matter disease and significant generalized atrophy.  Of note, patient was on Coumadin until about 1 or 2 years ago when he had an ICH and was taken off of Coumadin.  He was deemed by his cardiologist that it is fine to keep him off of anti-coagulation since his pacer interrogation has revealed no runs of atrial fibrillation in more than 12 months.  LKW: Greater than 24 hours ago tpa given?: no, outside the window Premorbid modified Rankin scale (mRS): 3  ROS: ROS was performed and is negative except as noted in the HPI.  It was obtained from the wife and daughter over the phone.  Patient unable to provide any history  Past Medical History:  Diagnosis Date  . CAD (coronary artery disease)    a. CABG 1991 and 2006 [place] LAD, seq. SVG D1-Cfx, SVG d2,and seq. SVG AM-PDA   . Chronic systolic CHF (congestive heart failure) ([place])   . CKD (chronic kidney disease)   . Dementia   . Depression   . Diabetes mellitus   . HTN (hypertension)   . Left leg DVT ([place])   . Prostate cancer ([place])   . Tachycardia-bradycardia syndrome ([place])    Family History  Problem Relation Age of Onset  . Diabetes Mother   . Heart disease Sister   . Heart disease Sister   . Heart disease Sister   . Heart disease Sister    Social History:   reports that he has never smoked. He has never used smokeless tobacco. He reports that he does not drink alcohol or use drugs. Retired physician  Medications  Current Facility-Administered Medications:  .  0.9 %  sodium chloride infusion, , Intravenous, Continuous, Florencia Reasons, MD .  acetaminophen (TYLENOL) tablet 650 mg, 650 mg, Oral, Q6H PRN **OR** acetaminophen (TYLENOL) suppository 650 mg, 650 mg, Rectal, Q6H PRN, Amin, Jeanella Flattery, MD .  Derrill Memo ON 10/21/2017] acidophilus (RISAQUAD) capsule 1 capsule, 1 capsule, Oral, Daily, Florencia Reasons, MD .  aspirin suppository 300 mg, 300 mg, Rectal, Once, Amin, Ankit Chirag, MD .  atenolol (TENORMIN)  tablet 50 mg, 50 mg, Oral, BID, Florencia Reasons, MD .  bisacodyl (DULCOLAX) suppository 10 mg, 10 mg, Rectal, Daily PRN, Berton Mount, RPH .  [START ON 10/21/2017] calcium carbonate (OS-CAL - dosed in mg of elemental calcium) tablet 500 mg of elemental calcium, 1 tablet, Oral, Daily, Zeigler, Dustin G, RPH .  carbamide peroxide (DEBROX) 6.5 % OTIC (EAR) solution 5 drop, 5 drop, Both EARS, BID, Florencia Reasons, MD, 5 drop at 10/20/17 1331 .  ezetimibe (ZETIA) tablet 10 mg, 10 mg, Oral, Daily, Florencia Reasons, MD .  feeding supplement (GLUCERNA SHAKE) (GLUCERNA SHAKE) liquid 237 mL, 237 mL, Oral, 3 times per day, Florencia Reasons, MD .  Derrill Memo ON 10/21/2017] ferrous sulfate tablet 325 mg, 325 mg, Oral, Daily, Berton Mount, RPH .  Glycerin (Adult) 2.1 g suppository 1 suppository, 1 suppository, Rectal, Daily PRN, Amin, Ankit Chirag, MD .  insulin aspart (novoLOG) injection 0-9 Units, 0-9 Units, Subcutaneous, Q4H, Kirby-Graham, Karsten Fells, NP, 2 Units at 10/20/17 1239 .  [START ON 10/21/2017] insulin detemir (LEVEMIR) injection 8 Units, 8 Units, Subcutaneous, Daily, Florencia Reasons, MD .  Derrill Memo ON 10/21/2017] linagliptin (TRADJENTA) tablet 5 mg, 5 mg, Oral, Daily, Florencia Reasons, MD .  MEDLINE mouth rinse, 15 mL, Mouth Rinse, BID, Amin, Ankit Chirag, MD, 15 mL at 10/20/17 1019 .  mirtazapine (REMERON) tablet 15 mg, 15 mg, Oral, QHS, Florencia Reasons, MD .  Derrill Memo ON 10/21/2017] multivitamin (PROSIGHT) tablet 1 tablet, 1 tablet, Oral, Daily, Berton Mount, RPH .  [START ON 10/21/2017] multivitamin with minerals tablet, , Oral, Daily, Florencia Reasons, MD .  ondansetron [facility]) tablet 4 mg, 4 mg, Oral, Q6H PRN **OR** ondansetron (ZOFRAN) injection 4 mg, 4 mg, Intravenous, Q6H PRN, Amin, Ankit Chirag, MD .  pantoprazole (PROTONIX) EC tablet 20 mg, 20 mg, Oral, Daily, Florencia Reasons, MD, 20 mg at 10/20/17 1510 .  piperacillin-tazobactam (ZOSYN) IVPB 2.25 g, 2.25 g, Intravenous, Q6H, Berton Mount, RPH, Stopped at 10/20/17 1341 .  QUEtiapine (SEROQUEL) tablet  25 mg, 25 mg, Oral, Daily PRN, Kirby-Graham, Karsten Fells, NP .  QUEtiapine (SEROQUEL) tablet 50 mg, 50 mg, Oral, Q24H, Kirby-Graham, Karsten Fells, NP .  senna (SENOKOT) tablet 8.6 mg, 1 tablet, Oral, QHS PRN, Berton Mount, RPH .  senna-docusate (Senokot-S) tablet 1 tablet, 1 tablet, Oral, BID, Florencia Reasons, MD .  sertraline (ZOLOFT) tablet 25 mg, 25 mg, Oral, QHS, Florencia Reasons, MD .  ziprasidone (GEODON) capsule 20 mg, 20 mg, Oral, BID PRN, Florencia Reasons, MD  Exam: Current vital signs: BP (!) 160/73 (BP Location: Left Arm)   Pulse 63   Temp 97.7 F (36.5 C) (Axillary)   Resp 19   Ht 5\' 11"  (1.803 m)   Wt 69 kg (152 lb 1.9 oz)   SpO2 100%   BMI 21.22 kg/m  Vital signs in last 24 hours: Temp:  [97.7 F (36.5 C)-98.7 F (37.1 C)] 97.7 F (36.5 C) (03/21 1317) Pulse Rate:  [62-89] 63 (03/21 1317) Resp:  [18-20] 19 (03/21 1317) BP: (  120-163)/(49-83) 160/73 (03/21 1317) SpO2:  [100 %] 100 % (03/21 1317) Weight:  [69 kg (152 lb 1.9 oz)] 69 kg (152 lb 1.9 oz) (03/21 0359)  GENERAL: Awake, alert in NAD HEENT: - Normocephalic and atraumatic, dry mm, no LN++, no Thyromegally LUNGS - Clear to auscultation bilaterally with no wheezes CV - S1S2 RRR, no m/r/g, equal pulses bilaterally. ABDOMEN - Soft, nontender, nondistended with normoactive BS Ext: warm, well perfused, intact peripheral pulses, no edema  NEURO:  Mental Status: Awake, alert, oriented x0.  Very poor attention concentration. Language: speech is not dysarthric.  Did not follow any commands.  Upon asking to tell his name, was slightly confrontational saying "why do you want to know my name", and to his wife he said "why could do not tell him my name but is in need to know my name from me". Cranial Nerves: PERRL. EOMI, visual fields full, no facial asymmetry, hearing reduced significantly bilaterally, tongue is midline Motor: Did not cooperate for testing.  No focal weakness noted and is moving all 4 extremities with equal vigor. Tone: is  normal and bulk is normal Sensation- Intact to light touch bilaterally Coordination: Did not perform Gait- deferred  NIHSS 1a Level of Conscious.: 0 1b LOC Questions: 2 1c LOC Commands:1  2 Best Gaze: 0 3 Visual: 0 4 Facial Palsy:0  5a Motor Arm - left:0  5b Motor Arm - Right:0  6a Motor Leg - Left: 0 6b Motor Leg - Right:0  7 Limb Ataxia: 0 8 Sensory: 0 9 Best Language: 0 10 Dysarthria: 0 11 Extinct. and Inatten.:0  TOTAL: 3  Labs I have reviewed labs in epic and the results pertinent to this consultation are:  CBC    Component Value Date/Time   WBC 3.8 (L) 10/20/2017 0458   RBC 3.13 (L) 10/20/2017 0458   HGB 9.7 (L) 10/20/2017 0458   HCT 28.1 (L) 10/20/2017 0458   PLT 130 (L) 10/20/2017 0458   MCV 89.8 10/20/2017 0458   MCV 92.7 03/22/2017 1146   MCH 31.0 10/20/2017 0458   MCHC 34.5 10/20/2017 0458   RDW 13.5 10/20/2017 0458   LYMPHSABS 0.7 11/12/2015 1103   MONOABS 0.4 11/12/2015 1103   EOSABS 0.1 11/12/2015 1103   BASOSABS 0.0 11/12/2015 1103    CMP     Component Value Date/Time   NA 142 10/20/2017 0458   NA 143 03/22/2017 1138   K 4.0 10/20/2017 0458   CL 107 10/20/2017 0458   CO2 26 10/20/2017 0458   GLUCOSE 183 (H) 10/20/2017 0458   BUN 39 (H) 10/20/2017 0458   BUN 54 (H) 03/22/2017 1138   CREATININE 2.99 (H) 10/20/2017 0458   CALCIUM 8.8 (L) 10/20/2017 0458   PROT 5.8 (L) 10/20/2017 0458   PROT 6.7 03/22/2017 1138   ALBUMIN 3.0 (L) 10/20/2017 0458   ALBUMIN 3.8 03/22/2017 1138   AST 28 10/20/2017 0458   ALT 25 10/20/2017 0458   ALKPHOS 53 10/20/2017 0458   BILITOT 0.7 10/20/2017 0458   BILITOT <0.2 03/22/2017 1138   GFRNONAA 17 (L) 10/20/2017 0458   GFRAA 19 (L) 10/20/2017 0458    Lipid Panel     Component Value Date/Time   CHOL 178 10/19/2017 0212   TRIG 175 (H) 10/19/2017 0212   HDL 38 (L) 10/19/2017 0212   CHOLHDL 4.7 10/19/2017 0212   VLDL 35 10/19/2017 0212   LDLCALC 105 (H) 10/19/2017 0212   Imaging I have reviewed the  images obtained: CT-scan of the brain-no  acute changes.  Assessment:  [name] is a 84 year old man with past medical history as above, was admitted with vomiting, confusion and possible dysarthria. Initial evaluation by telemetry neurology was consistent with encephalopathy.   My exam today, was also consistent with encephalopathy. My suspicion for stroke is low, although a stroke cannot be completely ruled out.  Unfortunately, because of his pacemaker being incompatible with MRI, we cannot obtain an MRI. Repeat imaging in the form of a CT scan and a comparison to the prior CT would provide [place] some guidance towards whether this is a new stroke or not, but suspicion is low.  Impression: Toxic metabolic insult neuropathy Low suspicion for stroke Dementia  Recommendations: Repeat noncontrast CT of the head If it shows any suspicion for an evolving stroke, at that time we will pursue stroke risk factor workup in the form of head and neck vessel imaging-he is allergic to contrast, so we will be left with the alternative of only getting carotid Dopplers as we cannot even get an MRI because of the pacemaker. -At that time echo should also be considered. Continue him on his antiplatelets for now. He is also allergic to statins and is currently on Zetia and fish oil which should be fine. I had a detailed conversation with his daughter over the phone and answered all her questions. Please call with questions  -- Amie Portland, MD [facility] Pager: [phone] If 7pm to 7am, please call on call as listed on AMION.

## 2017-10-20 NOTE — Progress Notes (Signed)
OT Cancellation Note  Patient Details Name: BRITNEY CAPTAIN MRN: [id number] DOB: 21-Feb-1923   Cancelled Treatment:    Reason Eval/Treat Not Completed: Other (comment)   Pt sleeping soundly. Pt had been resting 20 min per wife.  Wife and caregiver shared biggest challenge was getting pt to take a shower at home.  Per caregiver pt is not agreeable to shower and is often not agreeable to a wash off at baseline.   Caregiver inquired about RN or OT getting pt in shower here.  OT explained pt had an IV and he would have to be agreeable for this to be done.    Pt has A with all ADL activity at home including self feeding.  Caregiver states wife will leave utensil and sometimes pt would use it and sometimes he would not.  Pt has 24/7 A.  Explained acute OT might not be appropriate for pt as his baseline was to have A with ADL activity at home.   Caregiver often speaking for wife and interrupting wife when OT asking questions regarding PLOF.  OT will check back on pt as schedule allows- will likely be tomorrow  Kari Baars, [place]    Payton Mccallum D 10/20/2017, 12:25 PM

## 2017-10-20 NOTE — Progress Notes (Signed)
RN was not able to do the vitals at this time. Patient was agitated.

## 2017-10-20 NOTE — Progress Notes (Signed)
Patient extremely agitated and combative with staff. This RN tried to give patient his medication and patient became agitated and combative. Patient stated he did not want his medications and he wanted to be left alone. Patient is trying to get out of bed, pulling off telemetry and gown. Patient refusing assessment. Staff tried to redirect patient but was unsuccessful. NP on call paged. Will continue to monitor patient closely.

## 2017-10-20 NOTE — Progress Notes (Signed)
The patient has allowed RN to place the telemetry monitor back on the electrodes. The CMT was notified that the monitor was back on the patient.  Currently the patient is AV paced.

## 2017-10-21 DIAGNOSIS — R4182 Altered mental status, unspecified: Secondary | ICD-10-CM

## 2017-10-21 LAB — CBC WITH DIFFERENTIAL/PLATELET
BASOS PCT: 0 %
Basophils Absolute: 0 10*3/uL (ref 0.0–0.1)
EOS ABS: 0.1 10*3/uL (ref 0.0–0.7)
EOS PCT: 1 %
HCT: 31.2 % — ABNORMAL LOW (ref 39.0–52.0)
HEMOGLOBIN: 10.6 g/dL — AB (ref 13.0–17.0)
Lymphocytes Relative: 25 %
Lymphs Abs: 1.4 10*3/uL (ref 0.7–4.0)
MCH: 30.5 pg (ref 26.0–34.0)
MCHC: 34 g/dL (ref 30.0–36.0)
MCV: 89.7 fL (ref 78.0–100.0)
MONOS PCT: 9 %
Monocytes Absolute: 0.5 10*3/uL (ref 0.1–1.0)
Neutro Abs: 3.7 10*3/uL (ref 1.7–7.7)
Neutrophils Relative %: 65 %
PLATELETS: 138 10*3/uL — AB (ref 150–400)
RBC: 3.48 MIL/uL — ABNORMAL LOW (ref 4.22–5.81)
RDW: 13.5 % (ref 11.5–15.5)
WBC: 5.7 10*3/uL (ref 4.0–10.5)

## 2017-10-21 LAB — GLUCOSE, CAPILLARY
GLUCOSE-CAPILLARY: 151 mg/dL — AB (ref 65–99)
GLUCOSE-CAPILLARY: 178 mg/dL — AB (ref 65–99)
GLUCOSE-CAPILLARY: 254 mg/dL — AB (ref 65–99)
Glucose-Capillary: 152 mg/dL — ABNORMAL HIGH (ref 65–99)
Glucose-Capillary: 190 mg/dL — ABNORMAL HIGH (ref 65–99)
Glucose-Capillary: 210 mg/dL — ABNORMAL HIGH (ref 65–99)

## 2017-10-21 LAB — OCCULT BLOOD X 1 CARD TO LAB, STOOL: FECAL OCCULT BLD: NEGATIVE

## 2017-10-21 LAB — IRON AND TIBC
Iron: 41 ug/dL — ABNORMAL LOW (ref 45–182)
Saturation Ratios: 18 % (ref 17.9–39.5)
TIBC: 234 ug/dL — AB (ref 250–450)
UIBC: 193 ug/dL

## 2017-10-21 LAB — FOLATE: Folate: 19.6 ng/mL (ref >5.9)

## 2017-10-21 LAB — PSA: Prostatic Specific Antigen: 0.19 ng/mL (ref 0.00–4.00)

## 2017-10-21 LAB — BASIC METABOLIC PANEL
Anion gap: 13 (ref 5–15)
BUN: 38 mg/dL — AB (ref 6–20)
CALCIUM: 8.8 mg/dL — AB (ref 8.9–10.3)
CHLORIDE: 106 mmol/L (ref 101–111)
CO2: 20 mmol/L — ABNORMAL LOW (ref 22–32)
Creatinine, Ser: 2.82 mg/dL — ABNORMAL HIGH (ref 0.61–1.24)
GFR, EST AFRICAN AMERICAN: 21 mL/min — AB (ref >60)
GFR, EST NON AFRICAN AMERICAN: 18 mL/min — AB (ref >60)
Glucose, Bld: 183 mg/dL — ABNORMAL HIGH (ref 65–99)
Potassium: 3.9 mmol/L (ref 3.5–5.1)
SODIUM: 139 mmol/L (ref 135–145)

## 2017-10-21 LAB — VITAMIN B12: Vitamin B-12: 1317 pg/mL — ABNORMAL HIGH (ref 180–914)

## 2017-10-21 LAB — TSH: TSH: 3.555 u[IU]/mL (ref 0.350–4.500)

## 2017-10-21 MED ORDER — ZIPRASIDONE MESYLATE 20 MG IM SOLR
10.0000 mg | Freq: Three times a day (TID) | INTRAMUSCULAR | Status: DC | PRN
  Administered 2017-10-21: 10 mg via INTRAMUSCULAR
  Filled 2017-10-21 (×2): qty 20

## 2017-10-21 MED ORDER — POLYETHYLENE GLYCOL 3350 17 G PO PACK: 17.0000 g | PACK | Freq: Two times a day (BID) | ORAL | Status: DC

## 2017-10-21 MED ORDER — ZIPRASIDONE MESYLATE 20 MG IM SOLR
10.0000 mg | Freq: Once | INTRAMUSCULAR | Status: AC
  Administered 2017-10-21: 10 mg via INTRAMUSCULAR
  Filled 2017-10-21: qty 20

## 2017-10-21 MED ORDER — LIDOCAINE 5 % EX PTCH
1.0000 | MEDICATED_PATCH | CUTANEOUS | Status: DC
  Filled 2017-10-21 (×2): qty 1

## 2017-10-21 MED ORDER — AMOXICILLIN-POT CLAVULANATE 500-125 MG PO TABS
1.0000 | ORAL_TABLET | Freq: Two times a day (BID) | ORAL | Status: DC
  Administered 2017-10-21 – 2017-10-22 (×2): 500 mg via ORAL
  Filled 2017-10-21 (×2): qty 1

## 2017-10-21 MED ORDER — AMLODIPINE BESYLATE 5 MG PO TABS
5.0000 mg | ORAL_TABLET | Freq: Every day | ORAL | Status: DC
  Administered 2017-10-21 – 2017-10-22 (×2): 5 mg via ORAL
  Filled 2017-10-21 (×2): qty 1

## 2017-10-21 MED ORDER — INSULIN DETEMIR 100 UNIT/ML ~~LOC~~ SOLN
8.0000 [IU] | Freq: Every day | SUBCUTANEOUS | Status: DC
  Administered 2017-10-22: 4 [IU] via SUBCUTANEOUS
  Filled 2017-10-21: qty 0.08

## 2017-10-21 NOTE — Progress Notes (Signed)
OT Cancellation Note  Patient Details Name: [name] MRN: [id number] DOB: 03/18/1923   Cancelled Treatment:    Reason Eval/Treat Not Completed: OT screened, no needs identified, will sign off.  Spoke to daughters.  Pt needed assistance for all adls at baseline. They feel they can manage at home.    Maat Kafer 10/21/2017, 3:11 PM  Lesle Chris, OTR/L [phone] 10/21/2017

## 2017-10-21 NOTE — Progress Notes (Signed)
Chart review note CTH repeat reviewed. No evidence of acute bleed or evolving ischemic stroke. No new recs. Do not see an indication for stroke risk factor w/u. Continue to correct toxic metabolic derangements. Given poor baseline, consider palliative consult for [place] discussion as deemed appropriate by primary team.  -- Amie Portland, MD [facility] Pager: [phone] If 7pm to 7am, please call on call as listed on AMION.

## 2017-10-21 NOTE — Progress Notes (Addendum)
PROGRESS NOTE  [name] VZD:[id number] DOB: 19-Apr-1923 DOA: 10/19/2017 PCP: Seward Carol, MD  HPI/Recap of past 24 hours:  Patient is sleeping now No  Vomiting since admitted to the hospital No fever, he denies pain   Family want patient to stay in the hospital one more night to see if he can tolerate diet advancement,  they want miralax titrated because they reports patient now is having diarrhea. Pre RN patient received enema yesterday, patent is having moderate pasty stool x2 this am.   Assessment/Plan: Active Problems:   Colitis  Colitis, constipation -Presented with vomiting, he denies of pain, no fever, no leukocytosis -Started on Zosyn since admission, abx changed to Augmentin. -Advance diet as tolerated -[facility] consulted, input appreciated, case discussed with GI Dr [name] over the phone this am.  CT ab "1. Colonic wall thickening with mild pericolonic edema of the ascending and splenic flexure of the colon, suggesting colitis. Equivocal involvement of the transverse colon which is nondistended. 2. Large distal colonic stool burden with stool distending the rectum and mild rectal wall thickening. Question fecal impaction. 3. Minimal right bladder wall thickening, recommend correlation with urinalysis to exclude urinary tract infection. 4. Colonic diverticulosis without diverticulitis. 5. Small left pleural effusion. Dependent opacities in the lung bases likely atelectasis. 6.  Aortic Atherosclerosis (ICD10-I70.0)."  Dysarthria -Symptomatic resolved, CT had no acute findings.  Not able to get MRI due to pacemaker -Family requested neurology consult for TIA stroke workup, repeat Ct head no acute cva -neurology input appreciated  Hypertension -Blood pressure medication held initially due to concerning for TIA , allow for permissive hypertension -Gradually restart home BP meds  History of A. Fib, second-degree heart block, history of chronic  systolic heart failure Status post pacemaker placement Euvolemic dry on exam, may report patient has not been on Lasix for a year. Paced rhythm in the hospital Patient denies chest pain cardiology consulted per family requests, pacemaker interrogated.  No meds change per Cardiology. Patient is to follow up with cardiology Dr Tamala Julian in 11/2017.  Addendum: patient kept pulling on tele, will d/c tele.  Normocytic anemia -Hemoglobin baseline fluctuating from 9-12 since 2017 -Fecal occult study negative -hgb stable  Insulin-dependent type 2 diabetes -A1c 7.4 -He is on Levemir at home only -Resume Levemir and reduced dose while he is in the hospital, he is on SSI. -Continue home meds Tradjenta   CKDIV -cr  appears baseline  Ear wax,  Debrox dropped ordered  History of prostate cancer, family report he is getting  injections by urology for prostate cancer treatmen  PSA 0.19  Dementia, history of delirium requiring hospitalization for 10 days in January 2019 Per family he was taken off Wellbutrin, he was started on Seroquel.  Family was not happy that patient did not receive Seroquel on the day of admission.  Per family patient gets Seroquel daily at 5:30 PM , this is ordered. He also has seroquel 25mg  prn daily ordered for agitation, Per family he did request Geodon in the past while he had delirium, family request geodon prn ordered for agitation. I have discussed with family, hopefully early discharge to avoid in-hospital delirium, family requested patient to stay one more night in the hospital.   Code Status: full  Family Communication: patient , wife at bedsides, two daughters over the phone   Disposition Plan: home on 3/23 Family to decided the need of home health   Consultants:  Neurology  Cardiology  [facility]  Procedures:  none  Antibiotics:  Zosyn from admission to 3/22  augmentin from 3/22    Objective: BP (!) 162/97 (BP Location: Left Arm)   Pulse 93    Temp 98.2 F (36.8 C) (Axillary)   Resp 20   Ht 5\' 11"  (1.803 m)   Wt 69.1 kg (152 lb 5.4 oz)   SpO2 99%   BMI 21.25 kg/m   Intake/Output Summary (Last 24 hours) at 10/21/2017 1219 Last data filed at 10/21/2017 1000 Gross per 24 hour  Intake 1515.83 ml  Output 1425 ml  Net 90.83 ml   Filed Weights   10/19/17 1008 10/20/17 0359 10/21/17 0449  Weight: 68.2 kg (150 lb 5.7 oz) 69 kg (152 lb 1.9 oz) 69.1 kg (152 lb 5.4 oz)    Exam: Patient is examined daily including today on 10/21/2017, exams remain the same as of yesterday except that has changed    General:  Demented, NAD, hard of hearing  Cardiovascular: pace rhythm  Respiratory: CTABL  Abdomen: Soft/ND/NT, positive BS  Musculoskeletal: No Edema  Neuro: alert, oriented to self, he does not remember his birthdate, 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   Data Reviewed: Basic Metabolic Panel: Recent Labs  Lab 10/19/17 0212 10/20/17 0458 10/21/17 0516  NA 141 142 139  K 3.5 4.0 3.9  CL 105 107 106  CO2 26 26 20*  GLUCOSE 228* 183* 183*  BUN 48* 39* 38*  CREATININE 3.02* 2.99* 2.82*  CALCIUM 9.6 8.8* 8.8*   Liver Function Tests: Recent Labs  Lab 10/19/17 0212 10/20/17 0458  AST 40 28  ALT 33 25  ALKPHOS 112 53  BILITOT 0.3 0.7  PROT 6.8 5.8*  ALBUMIN 3.6 3.0*   Recent Labs  Lab 10/19/17 0212  LIPASE 92*   No results for input(s): AMMONIA in the last 168 hours. CBC: Recent Labs  Lab 10/19/17 0212 10/20/17 0458 10/21/17 0516  WBC 6.6 3.8* 5.7  NEUTROABS  --   --  3.7  HGB 10.9* 9.7* 10.6*  HCT 31.9* 28.1* 31.2*  MCV 89.1 89.8 89.7  PLT 144* 130* 138*   Cardiac Enzymes:   No results for input(s): CKTOTAL, CKMB, CKMBINDEX, TROPONINI in the last 168 hours. BNP (last 3 results) No results for input(s): BNP in the last 8760 hours.  ProBNP (last 3 results) No results for input(s): PROBNP in the last 8760 hours.  CBG: Recent Labs  Lab 10/20/17 1944 10/20/17 2320 10/21/17 0451  10/21/17 0919 10/21/17 1211  GLUCAP 174* 186* 151* 178* 190*    No results found for this or any previous visit (from the past 240 hour(s)).   Studies: Ct Head Wo Contrast  Result Date: 10/20/2017 CLINICAL DATA:  Stroke follow-up EXAM: CT HEAD WITHOUT CONTRAST TECHNIQUE: Contiguous axial images were obtained from the base of the skull through the vertex without intravenous contrast. COMPARISON:  Head CT 10/19/2017 FINDINGS: Brain: No mass lesion, intraparenchymal hemorrhage or extra-axial collection. No evidence of acute cortical infarct. Periventricular hypoattenuation suggesting chronic microvascular disease, in addition to generalized atrophy without lobar predilection. Vascular: Atherosclerotic calcification of the vertebral and internal carotid arteries at the skull base. No hyperdense vessel. Skull: Normal visualized skull base, calvarium and extracranial soft tissues. Sinuses/Orbits: No sinus fluid levels or advanced mucosal thickening. No mastoid effusion. Normal orbits. IMPRESSION: Chronic ischemic microangiopathy and parenchymal volume loss without acute intracranial abnormality. Electronically Signed   By: Ulyses Jarred M.D.   On: 10/20/2017 18:45    Scheduled Meds: . acidophilus  1  capsule Oral Daily  . amLODipine  5 mg Oral Daily  . amoxicillin-clavulanate  1 tablet Oral BID  . aspirin  300 mg Rectal Once  . atenolol  50 mg Oral BID  . calcium carbonate  1 tablet Oral Daily  . carbamide peroxide  5 drop Both EARS BID  . ezetimibe  10 mg Oral Daily  . feeding supplement (GLUCERNA SHAKE)  237 mL Oral 3 times per day  . ferrous sulfate  325 mg Oral Daily  . insulin aspart  0-9 Units Subcutaneous Q4H  . insulin detemir  6 Units Subcutaneous Daily  . isosorbide-hydrALAZINE  1 tablet Oral TID  . lidocaine  1 patch Transdermal Q24H  . linagliptin  5 mg Oral Daily  . mouth rinse  15 mL Mouth Rinse BID  . mirtazapine  15 mg Oral QHS  . multivitamin  1 tablet Oral Daily  .  multivitamin with minerals   Oral Daily  . pantoprazole  20 mg Oral Daily  . QUEtiapine  50 mg Oral Q24H  . sertraline  25 mg Oral QHS    Continuous Infusions: . sodium chloride 50 mL/hr at 10/20/17 1653     Time spent: 58mins , more than 50% time spent on coordination of care case discussed with cardiology, neurology, [facility] per family requests I have personally reviewed and interpreted on  10/21/2017 daily labs, tele strips, imagings as discussed above under date review session and assessment and plans.  I reviewed all nursing notes, pharmacy notes, consultant notes,  vitals, pertinent old records  I have discussed plan of care as described above with RN , patient and family on 10/21/2017   Florencia Reasons MD, PhD  [facility] Pager [phone]. If 7PM-7AM, please contact night-coverage at [email or web address], password [facility] 10/21/2017, 12:19 PM  LOS: 2 days

## 2017-10-21 NOTE — Plan of Care (Signed)
  Problem: Nutrition: Goal: Risk of aspiration will decrease Outcome: Progressing Goal: Dietary intake will improve Outcome: Progressing   

## 2017-10-21 NOTE — Progress Notes (Addendum)
Progress Note  Patient Name: [name] Date of Encounter: 10/21/2017  Primary Cardiologist: Sinclair Grooms, MD  Primary Electrophysiologist:  Dr. Lovena Le  Subjective   Pt resting in bed almost flat with no breathing difficulty. Very hard of hearing. Denies chest discomfort or dyspnea. No edema, Lungs clear.   Inpatient Medications    Scheduled Meds: . acidophilus  1 capsule Oral Daily  . amLODipine  5 mg Oral Daily  . aspirin  300 mg Rectal Once  . atenolol  50 mg Oral BID  . calcium carbonate  1 tablet Oral Daily  . carbamide peroxide  5 drop Both EARS BID  . ezetimibe  10 mg Oral Daily  . feeding supplement (GLUCERNA SHAKE)  237 mL Oral 3 times per day  . ferrous sulfate  325 mg Oral Daily  . insulin aspart  0-9 Units Subcutaneous Q4H  . insulin detemir  6 Units Subcutaneous Daily  . isosorbide-hydrALAZINE  1 tablet Oral TID  . linagliptin  5 mg Oral Daily  . mouth rinse  15 mL Mouth Rinse BID  . mirtazapine  15 mg Oral QHS  . multivitamin  1 tablet Oral Daily  . multivitamin with minerals   Oral Daily  . pantoprazole  20 mg Oral Daily  . polyethylene glycol  17 g Oral BID  . QUEtiapine  50 mg Oral Q24H  . sertraline  25 mg Oral QHS   Continuous Infusions: . sodium chloride 50 mL/hr at 10/20/17 1653  . piperacillin-tazobactam (ZOSYN)  IV Stopped (10/21/17 0529)   PRN Meds: acetaminophen **OR** acetaminophen, bisacodyl, Glycerin (Adult), ondansetron **OR** ondansetron (ZOFRAN) IV, QUEtiapine, senna, ziprasidone   Vital Signs    Vitals:   10/20/17 1317 10/20/17 2018 10/20/17 2322 10/21/17 0449  BP: (!) 160/73 (!) 150/94 135/81 (!) 162/97  Pulse: 63 81 63 93  Resp: 19 20  20   Temp: 97.7 F (36.5 C) 98.4 F (36.9 C)  98.2 F (36.8 C)  TempSrc: Axillary Oral  Axillary  SpO2: 100% 99%  99%  Weight:    152 lb 5.4 oz (69.1 kg)  Height:        Intake/Output Summary (Last 24 hours) at 10/21/2017 1026 Last data filed at 10/21/2017 1000 Gross per 24  hour  Intake 1515.83 ml  Output 1425 ml  Net 90.83 ml   Filed Weights   10/19/17 1008 10/20/17 0359 10/21/17 0449  Weight: 150 lb 5.7 oz (68.2 kg) 152 lb 1.9 oz (69 kg) 152 lb 5.4 oz (69.1 kg)    Telemetry    AV sequential pacing 60's-90's- Personally Reviewed  ECG    No new tracings- Personally Reviewed  Physical Exam   GEN: Elderly male, No acute distress.   Neck: No JVD Cardiac: RRR, no murmurs, rubs, or gallops.  Respiratory: Clear to auscultation bilaterally. GI: Soft, nontender, non-distended  MS: No edema; No deformity. Neuro:  Nonfocal, poor memory, very HOH Psych: Normal affect   Labs    Chemistry Recent Labs  Lab 10/19/17 0212 10/20/17 0458 10/21/17 0516  NA 141 142 139  K 3.5 4.0 3.9  CL 105 107 106  CO2 26 26 20*  GLUCOSE 228* 183* 183*  BUN 48* 39* 38*  CREATININE 3.02* 2.99* 2.82*  CALCIUM 9.6 8.8* 8.8*  PROT 6.8 5.8*  --   ALBUMIN 3.6 3.0*  --   AST 40 28  --   ALT 33 25  --   ALKPHOS 112 53  --   BILITOT  0.3 0.7  --   GFRNONAA 17* 17* 18*  GFRAA 19* 19* 21*  ANIONGAP 10 9 13      Hematology Recent Labs  Lab 10/19/17 0212 10/20/17 0458 10/21/17 0516  WBC 6.6 3.8* 5.7  RBC 3.58* 3.13* 3.48*  HGB 10.9* 9.7* 10.6*  HCT 31.9* 28.1* 31.2*  MCV 89.1 89.8 89.7  MCH 30.4 31.0 30.5  MCHC 34.2 34.5 34.0  RDW 13.2 13.5 13.5  PLT 144* 130* 138*    Cardiac EnzymesNo results for input(s): TROPONINI in the last 168 hours.  Recent Labs  Lab 10/19/17 0234  TROPIPOC 0.03     BNPNo results for input(s): BNP, PROBNP in the last 168 hours.   DDimer No results for input(s): DDIMER in the last 168 hours.   Radiology    Ct Head Wo Contrast  Result Date: 10/20/2017 CLINICAL DATA:  Stroke follow-up EXAM: CT HEAD WITHOUT CONTRAST TECHNIQUE: Contiguous axial images were obtained from the base of the skull through the vertex without intravenous contrast. COMPARISON:  Head CT 10/19/2017 FINDINGS: Brain: No mass lesion, intraparenchymal  hemorrhage or extra-axial collection. No evidence of acute cortical infarct. Periventricular hypoattenuation suggesting chronic microvascular disease, in addition to generalized atrophy without lobar predilection. Vascular: Atherosclerotic calcification of the vertebral and internal carotid arteries at the skull base. No hyperdense vessel. Skull: Normal visualized skull base, calvarium and extracranial soft tissues. Sinuses/Orbits: No sinus fluid levels or advanced mucosal thickening. No mastoid effusion. Normal orbits. IMPRESSION: Chronic ischemic microangiopathy and parenchymal volume loss without acute intracranial abnormality. Electronically Signed   By: Ulyses Jarred M.D.   On: 10/20/2017 18:45    Cardiac Studies   Echocardiogram 09/16/2016 Study Conclusions  - Left ventricle: The cavity size was normal. Wall thickness was   normal. Systolic function was moderately to severely reduced. The estimated ejection fraction was in the range of 30% to 35%.   Moderate diffuse hypokinesis with distinct regional wall motion   abnormalities. Severe hypokinesis of the entireinferolateral   myocardium; consistent with infarction in the distribution of the   left circumflex coronary artery. Features are consistent with a   pseudonormal left ventricular filling pattern, with concomitant   abnormal relaxation and increased filling pressure (grade 2   diastolic dysfunction). - Ventricular septum: Septal motion showed abnormal function,   dyssynergy, and paradox. These changes are consistent with right ventricular pacing. - Aortic valve: There was mild regurgitation. - Mitral valve: There was mild to moderate regurgitation directed   centrally. - Left atrium: The atrium was mildly dilated. - Right ventricle: Systolic function was mildly to moderately   reduced. - Right atrium: The atrium was mildly dilated. - Tricuspid valve: There was moderate regurgitation. - Pulmonary arteries: Systolic pressure was  moderately increased. PA peak pressure: 59 mm Hg (S).  Impressions: - There is marked pacing-induced apex-to-base and septal-to lateral wall dyssynchrony, which makes regionall motion assessment challenging. Nevertheless, there appears to be distinctly worse inferolateral wall contractility, compared to generalized moderate LV hypokinesis.   Patient Profile     84 y.o. 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 He also has symptoms of TIA.  Cardiology was asked for input on medication management and PPM interrogation.   Assessment & Plan    Paroxysmal atrial fibrillation: Low A. fib burden recent device  checks.  Not anticoagulated due to history of GI bleed and head trauma/fall with previous intra-parenchymal bleed.  Aspirin has been discontinued there is no data to suggest that aspirin will reduce the risk of embolic CVA with afib.  The patient is currently AV pacing in the 60s-90s.  Tachybradycardia syndrome with permanent pacemaker. Interrogated yesterday: pacing and sensing appropriately, 1 high V rate episode on 08/27/17 5 second duration. 10 high atrial rate episodes, longest 96 hrs 08/18/17, most recent 08/26/17.   Hypertension: Blood pressure is intermittently elevated.  My blood pressure documented for this morning.  He has continued on his home medication strategy  CAD: continue medical management  Chronic combined systolic and diastolic HF: EF 60-67% on echo 09/2017. On BB, Bidil, lasix. Currently well compensated. No further workup considering his advanced age and comorbid conditions.   Pt is OK for discharge on current therapy from cardiology standpoint.  He can keep his scheduled appt with Dr. Tamala Julian on 12/16/17.  For questions or updates, please contact [facility] Please consult [email or web address] for contact info under Cardiology/STEMI.      Signed, Daune Perch, NP  10/21/2017, 10:26 AM

## 2017-10-21 NOTE — Progress Notes (Signed)
Spoke with pt's daughters at bedside concerning [employer] PT. Both declined HHPT at present time. Pt have [place]. Sitters are in pt's rooms.

## 2017-10-21 NOTE — Progress Notes (Signed)
Note per neurology, there is not evidence of acute bleed or ischemic stroke.  Will sign off as pt able to verbalize his basic needs to his caregiver Massie Maroon on Wednesday during evaluation and has support needed at discharge. Luanna Salk, [facility] SLP [phone]

## 2017-10-21 NOTE — Consult Note (Signed)
[facility] CONSULT Reason for consult: Constipation and abnormal CT Referring Physician: [facility] hospitalist  [name] is an 84 y.o. male.  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  He has had CABG x2 in the past.  He has 2 daughters who are physicians and are here in town.  Much of the history is obtained from the 2 daughters in the medical records.  1 of the daughters is a gastroenterologist. The patient presented to the emergency room with confusion, stuttering, and nausea and vomiting.  He has been seen by neurology, initially via the Internet in the emergency room.  The patient could not have an MRI since he has a pacemaker.  He had changes in his speech etc.  Apparently his neurological condition improved dramatically since admission.  He has had a noncontrast CT of the head did not show any type of evolving stroke etc.  Neurology's opinion is that this was a toxic insult of some sort that has resolved and there was very low suspicion of an acute stroke. Since he was having nausea and vomiting, CT of the abdomen was obtained utilizing the renal stone protocol.  He therefore had no oral or IV contrast.  This did show thickening of the colon and increased stool burden throughout the colon with rectal distention and questionable thickening of the rectum.  The patient did receive enemas.  He reports to me that he feels fine now and is not having any problems.  He denies abdominal pain. The patient's daughter indicates that he has had chronic problems with constipation and has been using MiraLAX on an intermittent basis.  He supposedly has fairly regular bowel movements.   Past Medical History:  Diagnosis Date  . CAD (coronary artery disease)    a. CABG 1991 and 2006 LIMA LAD, seq. SVG D1-Cfx, SVG d2,and seq. SVG AM-PDA   . Chronic systolic CHF  (congestive heart failure) ([place])   . CKD (chronic kidney disease)   . Dementia   . Depression   . Diabetes mellitus   . HTN (hypertension)   . Left leg DVT (Garza-[place] II)   . Prostate cancer ([place])   . Tachycardia-bradycardia syndrome [facility])     Past Surgical History:  Procedure Laterality Date  . CORONARY ARTERY BYPASS GRAFT    . PROSTATECTOMY      Family History  Problem Relation Age of Onset  . Diabetes Mother   . Heart disease Sister   . Heart disease Sister   . Heart disease Sister   . Heart disease Sister     Social History:  reports that he has never smoked. He has never used smokeless tobacco. He reports that he does not drink alcohol or use drugs.  Allergies:  Allergies  Allergen Reactions  . Iodides Anaphylaxis and Other (See Comments)  . Iodinated Diagnostic Agents Anaphylaxis  . Iohexol Anaphylaxis     Desc: RN states pt stated he had anyphalactic shock reaction to contrast approx 10 years ago.   . Other Shortness Of Breath  . Ace Inhibitors Other (See Comments)    cough  . Simvastatin Other (See Comments)    Muscle aches  . Aspirin     Other reaction(s): Abdominal Pain    Medications; Prior to Admission medications   Medication Sig Start Date End Date Taking? Authorizing Provider  acetaminophen (TYLENOL) 500 MG tablet Take 500 mg by  mouth every 6 (six) hours as needed.   Yes [provider]  amLODipine (NORVASC) 5 MG tablet Take 1 tablet (5 mg total) by mouth daily. 09/21/16  Yes Rosita Fire, MD  atenolol (TENORMIN) 50 MG tablet Take 50 mg by mouth 2 (two) times daily.    Yes [provider]  CALCIUM PO Take 600 mg by mouth 2 (two) times daily.    Yes [provider]  Coenzyme Q10 (COQ10 PO) Take 1 capsule by mouth daily.   Yes [provider]  feeding supplement, GLUCERNA SHAKE, (GLUCERNA SHAKE) LIQD Take 237 mLs by mouth 3 (three) times daily as needed (feeding supplement).   Yes [provider]  Insulin  Detemir (LEVEMIR) 100 UNIT/ML Pen Inject 8 Units into the skin daily as needed. For blood glucose per sliding scale. 11/15/15  Yes [provider]  IRON PO Take 1 tablet by mouth daily.   Yes [provider]  isosorbide-hydrALAZINE (BIDIL) 20-37.5 MG tablet Take 1 tablet by mouth 3 (three) times daily. 11/15/15  Yes Rivet, Sindy Guadeloupe, MD  linagliptin (TRADJENTA) 5 MG TABS tablet Take 7.5 mg by mouth daily.   Yes [provider]  mirtazapine (REMERON) 15 MG tablet Take 15 mg by mouth at bedtime.   Yes [provider]  Multiple Vitamins-Minerals (ICAPS PO) Take 1 tablet by mouth 2 (two) times daily.   Yes [provider]  Multiple Vitamins-Minerals (MULTIVITAMIN ADULT PO) Take 1 capsule by mouth daily.   Yes [provider]  polyethylene glycol (MIRALAX / GLYCOLAX) packet Take 17 g by mouth daily as needed for moderate constipation.   Yes [provider]  Probiotic Product (ALIGN PO) Take 1 capsule by mouth daily.   Yes [provider]  QUEtiapine (SEROQUEL) 25 MG tablet Take 25 mg by mouth daily as needed.   Yes [provider]  QUEtiapine (SEROQUEL) 50 MG tablet Take 50 mg by mouth daily. At 530 pm   Yes [provider]  SENNA PO Take 1 tablet by mouth daily.   Yes [provider]  sertraline (ZOLOFT) 25 MG tablet Take 25 mg by mouth daily.    Yes [provider]  furosemide (LASIX) 20 MG tablet Take 1 tablet (20 mg total) by mouth daily. Patient not taking: Reported on 10/19/2017 11/15/15   Rivet, Sindy Guadeloupe, MD  Glucose Blood (FREESTYLE LITE TEST VI) USE AS DIRECTED 06/02/13   [provider]   . acidophilus  1 capsule Oral Daily  . amLODipine  5 mg Oral Daily  . aspirin  300 mg Rectal Once  . atenolol  50 mg Oral BID  . calcium carbonate  1 tablet Oral Daily  . carbamide peroxide  5 drop Both EARS BID  . ezetimibe  10 mg Oral Daily  . feeding supplement (GLUCERNA SHAKE)  237 mL Oral 3  times per day  . ferrous sulfate  325 mg Oral Daily  . insulin aspart  0-9 Units Subcutaneous Q4H  . insulin detemir  6 Units Subcutaneous Daily  . isosorbide-hydrALAZINE  1 tablet Oral TID  . linagliptin  5 mg Oral Daily  . mouth rinse  15 mL Mouth Rinse BID  . mirtazapine  15 mg Oral QHS  . multivitamin  1 tablet Oral Daily  . multivitamin with minerals   Oral Daily  . pantoprazole  20 mg Oral Daily  . QUEtiapine  50 mg Oral Q24H  . senna-docusate  1 tablet Oral BID  .  sertraline  25 mg Oral QHS   PRN Meds acetaminophen **OR** acetaminophen, bisacodyl, Glycerin (Adult), ondansetron **OR** ondansetron (ZOFRAN) IV, QUEtiapine, senna, ziprasidone Results for orders placed or performed during the hospital encounter of 10/19/17 (from the past 48 hour(s))  Glucose, capillary     Status: Abnormal   Collection Time: 10/19/17 12:18 PM  Result Value Ref Range   Glucose-Capillary 210 (H) 65 - 99 mg/dL  Glucose, capillary     Status: Abnormal   Collection Time: 10/19/17  5:48 PM  Result Value Ref Range   Glucose-Capillary 192 (H) 65 - 99 mg/dL  Glucose, capillary     Status: Abnormal   Collection Time: 10/20/17  4:25 AM  Result Value Ref Range   Glucose-Capillary 163 (H) 65 - 99 mg/dL  Comprehensive metabolic panel     Status: Abnormal   Collection Time: 10/20/17  4:58 AM  Result Value Ref Range   Sodium 142 135 - 145 mmol/L   Potassium 4.0 3.5 - 5.1 mmol/L   Chloride 107 101 - 111 mmol/L   CO2 26 22 - 32 mmol/L   Glucose, Bld 183 (H) 65 - 99 mg/dL   BUN 39 (H) 6 - 20 mg/dL   Creatinine, Ser 2.99 (H) 0.61 - 1.24 mg/dL   Calcium 8.8 (L) 8.9 - 10.3 mg/dL   Total Protein 5.8 (L) 6.5 - 8.1 g/dL   Albumin 3.0 (L) 3.5 - 5.0 g/dL   AST 28 15 - 41 U/L   ALT 25 17 - 63 U/L   Alkaline Phosphatase 53 38 - 126 U/L   Total Bilirubin 0.7 0.3 - 1.2 mg/dL   GFR calc non Af Amer 17 (L) >60 mL/min   GFR calc Af Amer 19 (L) >60 mL/min    Comment: (NOTE) The eGFR has been calculated using the CKD  EPI equation. This calculation has not been validated in all clinical situations. eGFR's persistently <60 mL/min signify possible Chronic Kidney Disease.    Anion gap 9 5 - 15    Comment: Performed at [facility], [place] [address] [place], [place] [address]  CBC     Status: Abnormal   Collection Time: 10/20/17  4:58 AM  Result Value Ref Range   WBC 3.8 (L) 4.0 - 10.5 K/uL   RBC 3.13 (L) 4.22 - 5.81 MIL/uL   Hemoglobin 9.7 (L) 13.0 - 17.0 g/dL   HCT 28.1 (L) 39.0 - 52.0 %   MCV 89.8 78.0 - 100.0 fL   MCH 31.0 26.0 - 34.0 pg   MCHC 34.5 30.0 - 36.0 g/dL   RDW 13.5 11.5 - 15.5 %   Platelets 130 (L) 150 - 400 K/uL    Comment: Performed at [facility], [place] [address], [place], [place] [address]  Protime-INR     Status: None   Collection Time: 10/20/17  4:58 AM  Result Value Ref Range   Prothrombin Time 14.5 11.4 - 15.2 seconds   INR 1.14     Comment: Performed at [facility], [place] [address], [place], [place] [address]  APTT     Status: None   Collection Time: 10/20/17  4:58 AM  Result Value Ref Range   aPTT 30 24 - 36 seconds    Comment: Performed at [facility], [place] [address] [place], [place] [address]  Glucose, capillary     Status: Abnormal   Collection Time: 10/20/17  7:40 AM  Result Value Ref Range   Glucose-Capillary 180 (  H) 65 - 99 mg/dL  Glucose, capillary     Status: Abnormal   Collection Time: 10/20/17 11:31 AM  Result Value Ref Range   Glucose-Capillary 193 (H) 65 - 99 mg/dL  Occult blood card to lab, stool     Status: None   Collection Time: 10/20/17  4:30 PM  Result Value Ref Range   Fecal Occult Bld NEGATIVE NEGATIVE    Comment: Performed at [facility] [place] [address] [place], [place] [address]  Glucose, capillary     Status: None   Collection Time: 10/20/17  4:33 PM  Result Value Ref Range   Glucose-Capillary 84 65 - 99 mg/dL  Glucose, capillary     Status:  Abnormal   Collection Time: 10/20/17  7:44 PM  Result Value Ref Range   Glucose-Capillary 174 (H) 65 - 99 mg/dL  Glucose, capillary     Status: Abnormal   Collection Time: 10/20/17 11:20 PM  Result Value Ref Range   Glucose-Capillary 186 (H) 65 - 99 mg/dL  Glucose, capillary     Status: Abnormal   Collection Time: 10/21/17  4:51 AM  Result Value Ref Range   Glucose-Capillary 151 (H) 65 - 99 mg/dL  PSA     Status: None   Collection Time: 10/21/17  5:16 AM  Result Value Ref Range   Prostatic Specific Antigen 0.19 0.00 - 4.00 ng/mL    Comment: (NOTE) While PSA levels of <=4.0 ng/ml are reported as reference range, some men with levels below 4.0 ng/ml can have prostate cancer and many men with PSA above 4.0 ng/ml do not have prostate cancer.  Other tests such as free PSA, age specific reference ranges, PSA velocity and PSA doubling time may be helpful especially in men less than 47 years old. Performed at [facility], [place] [address] [place], [place] [address]   CBC with Differential/Platelet     Status: Abnormal   Collection Time: 10/21/17  5:16 AM  Result Value Ref Range   WBC 5.7 4.0 - 10.5 K/uL   RBC 3.48 (L) 4.22 - 5.81 MIL/uL   Hemoglobin 10.6 (L) 13.0 - 17.0 g/dL   HCT 31.2 (L) 39.0 - 52.0 %   MCV 89.7 78.0 - 100.0 fL   MCH 30.5 26.0 - 34.0 pg   MCHC 34.0 30.0 - 36.0 g/dL   RDW 13.5 11.5 - 15.5 %   Platelets 138 (L) 150 - 400 K/uL   Neutrophils Relative % 65 %   Neutro Abs 3.7 1.7 - 7.7 K/uL   Lymphocytes Relative 25 %   Lymphs Abs 1.4 0.7 - 4.0 K/uL   Monocytes Relative 9 %   Monocytes Absolute 0.5 0.1 - 1.0 K/uL   Eosinophils Relative 1 %   Eosinophils Absolute 0.1 0.0 - 0.7 K/uL   Basophils Relative 0 %   Basophils Absolute 0.0 0.0 - 0.1 K/uL    Comment: Performed at [facility], [place] [address] [place], [place] [address]  Basic metabolic panel     Status: Abnormal   Collection Time: 10/21/17  5:16 AM  Result Value Ref Range    Sodium 139 135 - 145 mmol/L   Potassium 3.9 3.5 - 5.1 mmol/L   Chloride 106 101 - 111 mmol/L   CO2 20 (L) 22 - 32 mmol/L   Glucose, Bld 183 (H) 65 - 99 mg/dL   BUN 38 (H) 6 - 20 mg/dL   Creatinine, Ser 2.82 (H) 0.61 - 1.24 mg/dL  Calcium 8.8 (L) 8.9 - 10.3 mg/dL   GFR calc non Af Amer 18 (L) >60 mL/min   GFR calc Af Amer 21 (L) >60 mL/min    Comment: (NOTE) The eGFR has been calculated using the CKD EPI equation. This calculation has not been validated in all clinical situations. eGFR's persistently <60 mL/min signify possible Chronic Kidney Disease.    Anion gap 13 5 - 15    Comment: Performed at [facility], [place] [address] [place], [place] [address]  Iron and TIBC     Status: Abnormal   Collection Time: 10/21/17  5:16 AM  Result Value Ref Range   Iron 41 (L) 45 - 182 ug/dL   TIBC 234 (L) 250 - 450 ug/dL   Saturation Ratios 18 17.9 - 39.5 %   UIBC 193 ug/dL    Comment: Performed at [facility], [place] [address], [place], [place] [address]  Vitamin B12     Status: Abnormal   Collection Time: 10/21/17  5:16 AM  Result Value Ref Range   Vitamin B-12 1,317 (H) 180 - 914 pg/mL    Comment: (NOTE) This assay is not validated for testing neonatal or myeloproliferative syndrome specimens for Vitamin B12 levels. Performed at [facility], [place] [address], [place], [place] [address]   Folate     Status: None   Collection Time: 10/21/17  5:16 AM  Result Value Ref Range   Folate 19.6 >5.9 ng/mL    Comment: Performed at [place] [address] [place], [place] [address]  TSH     Status: None   Collection Time: 10/21/17  5:16 AM  Result Value Ref Range   TSH 3.555 0.350 - 4.500 uIU/mL    Comment: Performed by a 3rd Generation assay with a functional sensitivity of <=0.01 uIU/mL. Performed at [facility], [place] [address], [place], [place] [address]   Glucose, capillary     Status: Abnormal   Collection Time: 10/21/17   9:19 AM  Result Value Ref Range   Glucose-Capillary 178 (H) 65 - 99 mg/dL    Ct Head Wo Contrast  Result Date: 10/20/2017 CLINICAL DATA:  Stroke follow-up EXAM: CT HEAD WITHOUT CONTRAST TECHNIQUE: Contiguous axial images were obtained from the base of the skull through the vertex without intravenous contrast. COMPARISON:  Head CT 10/19/2017 FINDINGS: Brain: No mass lesion, intraparenchymal hemorrhage or extra-axial collection. No evidence of acute cortical infarct. Periventricular hypoattenuation suggesting chronic microvascular disease, in addition to generalized atrophy without lobar predilection. Vascular: Atherosclerotic calcification of the vertebral and internal carotid arteries at the skull base. No hyperdense vessel. Skull: Normal visualized skull base, calvarium and extracranial soft tissues. Sinuses/Orbits: No sinus fluid levels or advanced mucosal thickening. No mastoid effusion. Normal orbits. IMPRESSION: Chronic ischemic microangiopathy and parenchymal volume loss without acute intracranial abnormality. Electronically Signed   By: Ulyses Jarred M.D.   On: 10/20/2017 18:45   ROS: Basically unobtainable            Blood pressure (!) 162/97, pulse 93, temperature 98.2 F (36.8 C), temperature source Axillary, resp. rate 20, height '5\' 11"'$  (1.803 m), weight 69.1 kg (152 lb 5.4 oz), SpO2 99 %.  Physical exam:    General--Pleasant African-American male in no distress somewhat hard of hearing ENT-nonicteric- Neck--supple with no lymphadenopathy Heart--somewhat irregular no murmurs or gallops Lungs--clear Abdomen--nondistended and completely nontender with normal bowel sounds.  No masses are palpated Rectal--patient did agree to allow me to  do a rectal exam.  No masses palpated to the extent of my finger and very little stool in the rectum it was soft and liquid brown.  Stool guaiac was sent to the lab for check. Psych--pleasantly confused   Assessment: 1.  Abnormal CT of  the colon.  This CT was done renal protocol with no oral contrast or IV contrast.  There does appear to be a somewhat increased stool burden but there is no gross bleeding and I do not think he has clear colitis.  According to the daughter he has had regular colonoscopies in the past with no significant findings last about 10 years ago.  This was not repeated due to his advanced age.  He seems to be improved with enemas and Dulcolax. 2.  Altered mental status.  This appears to have resolved completely.  Neurology is on board and does not feel that he has had an acute stroke. 3.  History of A. fib, heart block requiring pacemaker, status post CABG x2 4.  Type 2 diabetes on insulin 5.  Vascular dementia 6.  History of prostatectomy  Plan: 1.  Long discussion with the patient's daughter.  He clearly seems to be better.  Up until recently his bowel movements appeared to be okay with MiraLAX and he did have colonoscopies in the past.  My recommendation will be to keep him on liquids and slowly advance his diet.  I will add MiraLAX and hopefully we will be able to get the dose adjusted.  If there is no blood in the stool no bleeding, abdominal pain etc. I do not think he needs any further testing.  We will continue to follow him while in the hospital at family's request.   Nancy Fetter 10/21/2017, 9:26 AM   This note was created using voice recognition software and minor errors may Have occurred unintentionally. Pager: [phone] If no answer or after hours call [phone]

## 2017-10-22 DIAGNOSIS — E119 Type 2 diabetes mellitus without complications: Secondary | ICD-10-CM

## 2017-10-22 DIAGNOSIS — N183 Chronic kidney disease, stage 3 (moderate): Secondary | ICD-10-CM

## 2017-10-22 DIAGNOSIS — I5043 Acute on chronic combined systolic (congestive) and diastolic (congestive) heart failure: Secondary | ICD-10-CM

## 2017-10-22 DIAGNOSIS — Z950 Presence of cardiac pacemaker: Secondary | ICD-10-CM

## 2017-10-22 DIAGNOSIS — G9341 Metabolic encephalopathy: Secondary | ICD-10-CM

## 2017-10-22 DIAGNOSIS — I495 Sick sinus syndrome: Secondary | ICD-10-CM

## 2017-10-22 LAB — CBC WITH DIFFERENTIAL/PLATELET
Basophils Absolute: 0 10*3/uL (ref 0.0–0.1)
Basophils Relative: 0 %
Eosinophils Absolute: 0.1 10*3/uL (ref 0.0–0.7)
Eosinophils Relative: 1 %
HCT: 30.4 % — ABNORMAL LOW (ref 39.0–52.0)
HEMOGLOBIN: 10.5 g/dL — AB (ref 13.0–17.0)
LYMPHS ABS: 1 10*3/uL (ref 0.7–4.0)
Lymphocytes Relative: 17 %
MCH: 30.5 pg (ref 26.0–34.0)
MCHC: 34.5 g/dL (ref 30.0–36.0)
MCV: 88.4 fL (ref 78.0–100.0)
MONOS PCT: 9 %
Monocytes Absolute: 0.5 10*3/uL (ref 0.1–1.0)
NEUTROS PCT: 73 %
Neutro Abs: 4.5 10*3/uL (ref 1.7–7.7)
Platelets: 137 10*3/uL — ABNORMAL LOW (ref 150–400)
RBC: 3.44 MIL/uL — AB (ref 4.22–5.81)
RDW: 13.5 % (ref 11.5–15.5)
WBC: 6.1 10*3/uL (ref 4.0–10.5)

## 2017-10-22 LAB — BASIC METABOLIC PANEL
Anion gap: 10 (ref 5–15)
BUN: 40 mg/dL — AB (ref 6–20)
CO2: 23 mmol/L (ref 22–32)
CREATININE: 2.8 mg/dL — AB (ref 0.61–1.24)
Calcium: 9.3 mg/dL (ref 8.9–10.3)
Chloride: 105 mmol/L (ref 101–111)
GFR calc Af Amer: 21 mL/min — ABNORMAL LOW (ref >60)
GFR calc non Af Amer: 18 mL/min — ABNORMAL LOW (ref >60)
Glucose, Bld: 66 mg/dL (ref 65–99)
POTASSIUM: 3.6 mmol/L (ref 3.5–5.1)
SODIUM: 138 mmol/L (ref 135–145)

## 2017-10-22 LAB — GLUCOSE, CAPILLARY
GLUCOSE-CAPILLARY: 79 mg/dL (ref 65–99)
Glucose-Capillary: 108 mg/dL — ABNORMAL HIGH (ref 65–99)
Glucose-Capillary: 194 mg/dL — ABNORMAL HIGH (ref 65–99)

## 2017-10-22 MED ORDER — EZETIMIBE 10 MG PO TABS: 10.0000 mg | ORAL_TABLET | Freq: Every day | ORAL | 0 refills | Status: AC

## 2017-10-22 MED ORDER — CARBAMIDE PEROXIDE 6.5 % OT SOLN: 5.0000 [drp] | Freq: Two times a day (BID) | OTIC | 0 refills | Status: DC

## 2017-10-22 MED ORDER — SULFAMETHOXAZOLE-TRIMETHOPRIM 400-80 MG PO TABS: 1.0000 | ORAL_TABLET | Freq: Two times a day (BID) | ORAL | 0 refills | Status: AC

## 2017-10-22 MED ORDER — FUROSEMIDE 20 MG PO TABS: 20.0000 mg | ORAL_TABLET | Freq: Every day | ORAL | 0 refills | Status: DC | PRN

## 2017-10-22 MED ORDER — ZIPRASIDONE HCL 20 MG PO CAPS: 20.0000 mg | ORAL_CAPSULE | Freq: Two times a day (BID) | ORAL | 0 refills | Status: DC | PRN

## 2017-10-22 NOTE — Progress Notes (Signed)
Progress Note  Patient Name: [name] Date of Encounter: 10/22/2017  Primary Cardiologist: Sinclair Grooms, MD  Primary Electrophysiologist:  Dr. Lovena Le  Subjective   Poor hearing no cardiac complaints   Inpatient Medications    Scheduled Meds: . acidophilus  1 capsule Oral Daily  . amLODipine  5 mg Oral Daily  . amoxicillin-clavulanate  1 tablet Oral BID  . atenolol  50 mg Oral BID  . calcium carbonate  1 tablet Oral Daily  . carbamide peroxide  5 drop Both EARS BID  . ezetimibe  10 mg Oral Daily  . feeding supplement (GLUCERNA SHAKE)  237 mL Oral 3 times per day  . ferrous sulfate  325 mg Oral Daily  . insulin aspart  0-9 Units Subcutaneous Q4H  . insulin detemir  8 Units Subcutaneous Daily  . isosorbide-hydrALAZINE  1 tablet Oral TID  . lidocaine  1 patch Transdermal Q24H  . linagliptin  5 mg Oral Daily  . mouth rinse  15 mL Mouth Rinse BID  . mirtazapine  15 mg Oral QHS  . multivitamin  1 tablet Oral Daily  . multivitamin with minerals   Oral Daily  . pantoprazole  20 mg Oral Daily  . QUEtiapine  50 mg Oral Q24H  . sertraline  25 mg Oral QHS   Continuous Infusions:  PRN Meds: acetaminophen **OR** acetaminophen, bisacodyl, Glycerin (Adult), ondansetron **OR** ondansetron (ZOFRAN) IV, QUEtiapine, senna, ziprasidone, ziprasidone   Vital Signs    Vitals:   10/21/17 0449 10/21/17 2200 10/22/17 0432 10/22/17 0500  BP: (!) 162/97 (!) 166/85 (!) 164/78   Pulse: 93 96 66   Resp: 20 18 18    Temp: 98.2 F (36.8 C) 98.9 F (37.2 C) 98 F (36.7 C)   TempSrc: Axillary Axillary Oral   SpO2: 99% 97% 100%   Weight: 152 lb 5.4 oz (69.1 kg)   156 lb 12 oz (71.1 kg)  Height:        Intake/Output Summary (Last 24 hours) at 10/22/2017 0846 Last data filed at 10/21/2017 1450 Gross per 24 hour  Intake 466.67 ml  Output -  Net 466.67 ml   Filed Weights   10/20/17 0359 10/21/17 0449 10/22/17 0500  Weight: 152 lb 1.9 oz (69 kg) 152 lb 5.4 oz (69.1 kg) 156 lb  12 oz (71.1 kg)    Telemetry    AV sequential pacing 60's-90's- Personally Reviewed  ECG    AV pacing LBBB pattern  Physical Exam   Affect appropriate Elderly male  HEENT: poor hearing  Neck supple with no adenopathy JVP normal no bruits no thyromegaly Lungs clear with no wheezing and good diaphragmatic motion Heart:  S1/S2 SEM murmur, no rub, gallop or click PMI normal pacer under left clavicle  Abdomen: benighn, BS positve, no tenderness, no AAA no bruit.  No HSM or HJR Distal pulses intact with no bruits No edema Neuro non-focal Skin warm and dry No muscular weakness   Labs    Chemistry Recent Labs  Lab 10/19/17 0212 10/20/17 0458 10/21/17 0516 10/22/17 0424  NA 141 142 139 138  K 3.5 4.0 3.9 3.6  CL 105 107 106 105  CO2 26 26 20* 23  GLUCOSE 228* 183* 183* 66  BUN 48* 39* 38* 40*  CREATININE 3.02* 2.99* 2.82* 2.80*  CALCIUM 9.6 8.8* 8.8* 9.3  PROT 6.8 5.8*  --   --   ALBUMIN 3.6 3.0*  --   --   AST 40 28  --   --  ALT 33 25  --   --   ALKPHOS 112 53  --   --   BILITOT 0.3 0.7  --   --   GFRNONAA 17* 17* 18* 18*  GFRAA 19* 19* 21* 21*  ANIONGAP 10 9 13 10      Hematology Recent Labs  Lab 10/20/17 0458 10/21/17 0516 10/22/17 0424  WBC 3.8* 5.7 6.1  RBC 3.13* 3.48* 3.44*  HGB 9.7* 10.6* 10.5*  HCT 28.1* 31.2* 30.4*  MCV 89.8 89.7 88.4  MCH 31.0 30.5 30.5  MCHC 34.5 34.0 34.5  RDW 13.5 13.5 13.5  PLT 130* 138* 137*    Cardiac EnzymesNo results for input(s): TROPONINI in the last 168 hours.  Recent Labs  Lab 10/19/17 0234  TROPIPOC 0.03     BNPNo results for input(s): BNP, PROBNP in the last 168 hours.   DDimer No results for input(s): DDIMER in the last 168 hours.   Radiology    Ct Head Wo Contrast  Result Date: 10/20/2017 CLINICAL DATA:  Stroke follow-up EXAM: CT HEAD WITHOUT CONTRAST TECHNIQUE: Contiguous axial images were obtained from the base of the skull through the vertex without intravenous contrast. COMPARISON:  Head  CT 10/19/2017 FINDINGS: Brain: No mass lesion, intraparenchymal hemorrhage or extra-axial collection. No evidence of acute cortical infarct. Periventricular hypoattenuation suggesting chronic microvascular disease, in addition to generalized atrophy without lobar predilection. Vascular: Atherosclerotic calcification of the vertebral and internal carotid arteries at the skull base. No hyperdense vessel. Skull: Normal visualized skull base, calvarium and extracranial soft tissues. Sinuses/Orbits: No sinus fluid levels or advanced mucosal thickening. No mastoid effusion. Normal orbits. IMPRESSION: Chronic ischemic microangiopathy and parenchymal volume loss without acute intracranial abnormality. Electronically Signed   By: Ulyses Jarred M.D.   On: 10/20/2017 18:45    Cardiac Studies   Echocardiogram 09/16/2016 Study Conclusions  - Left ventricle: The cavity size was normal. Wall thickness was   normal. Systolic function was moderately to severely reduced. The estimated ejection fraction was in the range of 30% to 35%.   Moderate diffuse hypokinesis with distinct regional wall motion   abnormalities. Severe hypokinesis of the entireinferolateral   myocardium; consistent with infarction in the distribution of the   left circumflex coronary artery. Features are consistent with a   pseudonormal left ventricular filling pattern, with concomitant   abnormal relaxation and increased filling pressure (grade 2   diastolic dysfunction). - Ventricular septum: Septal motion showed abnormal function,   dyssynergy, and paradox. These changes are consistent with right ventricular pacing. - Aortic valve: There was mild regurgitation. - Mitral valve: There was mild to moderate regurgitation directed   centrally. - Left atrium: The atrium was mildly dilated. - Right ventricle: Systolic function was mildly to moderately   reduced. - Right atrium: The atrium was mildly dilated. - Tricuspid valve: There was  moderate regurgitation. - Pulmonary arteries: Systolic pressure was moderately increased. PA peak pressure: 59 mm Hg (S).  Impressions: - There is marked pacing-induced apex-to-base and septal-to lateral wall dyssynchrony, which makes regionall motion assessment challenging. Nevertheless, there appears to be distinctly worse inferolateral wall contractility, compared to generalized moderate LV hypokinesis.   Patient Profile     84 y.o. 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 He also has symptoms of TIA.  Cardiology  was asked for input on medication management and PPM interrogation.   Assessment & Plan    Paroxysmal atrial fibrillation: AV pacing no anticoagulation due to age.   Tachybradycardia syndrome normal pacer function low PAF burden AV pacing   Hypertension: continue home meds   CAD: continue medical management no chest pain   Chronic combined systolic and diastolic HF: EF 71-16% on echo 09/2017. On BB, Bidil, lasix. Currently well compensated. No further workup considering his advanced age and comorbid conditions.   Ok to d/c home will sign off has f/u with Dr Tamala Julian   10/22/2017, 8:46 AM

## 2017-10-22 NOTE — Discharge Summary (Signed)
Discharge Summary  [name] ZWC:[id number] DOB: 1923-02-14  PCP: Paul Carol, MD  Admit date: 10/19/2017 Discharge date: 10/22/2017  Time spent: >58mins, more than 50% time spent on coordination of care.  Recommendations for Outpatient Follow-up:  1. F/u with PMD within a week  for hospital discharge follow up, repeat cbc/bmp at follow up 2. F/u with cardiology  Discharge Diagnoses:  Active Hospital Problems   Diagnosis Date Noted  . Colitis 10/19/2017    Resolved Hospital Problems  No resolved problems to display.    Discharge Condition: stable  Diet recommendation: heart healthy/carb modified  Filed Weights   10/20/17 0359 10/21/17 0449 10/22/17 0500  Weight: 69 kg (152 lb 1.9 oz) 69.1 kg (152 lb 5.4 oz) 71.1 kg (156 lb 12 oz)    History of present illness:  PCP: Paul Poll, MD Patient coming from: Home  Chief Complaint: Difficulty speaking and vomiting  HPI: [name] is a 84 y.o. male with medical history significant of prostate cancer status post prostatectomy greater than 20 years ago, coronary artery disease, diabetes mellitus type 2-insulin-dependent, essential hypertension, hyperlipidemia was brought to the hospital for evaluation of difficulty speaking and nonbloody vomiting.  At baseline patient does have dementia and difficulty carrying on his routine tasks but otherwise able to participate in basic conversations.  Per family when he was speaking with 1 of the daughters yesterday evening around 9 PM they noted he was having some dysarthric speech but did not think of had much and patient went to sleep.  Around 1 AM patient woke up nauseous and had one episode of nonbloody vomiting and was still having some speech difficulty therefore wife brought him to the hospital for evaluation.  Per family patient does not have any speech deficit at baseline.  Denies any abdominal pain, fevers, chills, focal neuro deficits or any other complaints.  At  times he requires help with ADL S.  Typically he does suffer from constipation and used to take MiraLAX daily but then stopped taking it and only uses senna now as smoothies he has been drinking helps him.  Typically he has bowel movements every other day which are hard but over the course of last week he has been having soft 1 bowel movement every day. Per family his last colonoscopy was "many many years ago" and may have had polyp but no other major issues.  He has never had stroke, any other neurologic or GI symptoms in the past. In the ER his initial labs were overall unremarkable.  Creatinine was slightly elevated at 3.02 but this is not too far from his baseline of 2.8.  Appears slightly dysarthric and had abdominal discomfort.  In the ER he had CT renal study done which showed high stool burden and signs suggestive of colitis.  He was started on Rocephin and IV Flagyl.  CT of the head was also ordered and he was evaluated by telemetry neurologist.     Hospital Course:  Active Problems:   Colitis   Colitis, constipation -Presented with vomiting, he denies of pain, no fever, no leukocytosis - he received enema, constipation resolved -Started on Zosyn since admission, abx changed to Augmentin. -Advance diet as tolerated -[facility] consulted, input appreciated, case discussed with gi Dr [name], patient is cleared to discharge home from gi stand points. Daughter prefers patient to be discharged on bactrim instead of augmentin. Bactrim renal dosing prescribed at discharge.  CT ab "1. Colonic wall thickening with mild pericolonic edema of  the ascending and splenic flexure of the colon, suggesting colitis. Equivocal involvement of the transverse colon which is nondistended. 2. Large distal colonic stool burden with stool distending the rectum and mild rectal wall thickening. Question fecal impaction. 3. Minimal right bladder wall thickening, recommend correlation with urinalysis to exclude  urinary tract infection. 4. Colonic diverticulosis without diverticulitis. 5. Small left pleural effusion. Dependent opacities in the lung bases likely atelectasis. 6. Aortic Atherosclerosis (ICD10-I70.0)."  Dysarthria -Symptomatic resolved, CT had no acute findings.  Not able to get MRI due to pacemaker -Family requested neurology consult for TIA stroke workup, repeat Ct head no acute cva -neurology input appreciated, detail please refer to neurology consult notes.  Hypertension -Blood pressure medication held initially due to concerning for TIA , allow for permissive hypertension -Gradually restart home BP meds  History of A. Fib, second-degree heart block, history of chronic systolic heart failure Status post pacemaker placement Euvolemic dry on exam, may report patient has not been on Lasix for a year. Paced rhythm in the hospital Patient denies chest pain cardiology consulted per family requests, pacemaker interrogated.  No meds change per Cardiology. Patient is to follow up with cardiology Dr Tamala Julian in 11/2017.    Normocytic anemia -Hemoglobin baseline fluctuating from 9-12 since 2017 -Fecal occult study negative -hgb stable  Insulin-dependent type 2 diabetes -A1c 7.4 -He is on Levemir and tradjenta.    CKDIV -cr  appears baseline  Ear wax,  Debrox drops ordered  History of prostate cancer, family report he is getting  injections by urology for prostate cancer treatmen  PSA 0.19  Dementia, history of delirium requiring hospitalization for 10 days in January 2019 Per family he was taken off Wellbutrin, he was started on Seroquel.  Family was not happy that patient did not receive Seroquel on the day of admission.  Per family patient gets Seroquel daily at 5:30 PM , this is ordered. He also has seroquel 25mg  prn daily ordered for agitation, Per family he did request Geodon in the past while he had delirium, family request geodon prn ordered for agitation.    I have discussed with family, hopefully early discharge to avoid in-hospital delirium, family requested patient to stay one more night in the hospital.  Family requested geodon prescription at discharge.   Code Status: full  Family Communication: patient , wife at bedsides, two daughters over the phone   Disposition Plan: home on 3/23 Family to decide on the need of home health   Consultants:  Neurology  Cardiology  [facility]  Procedures:  none  Antibiotics:  Zosyn from admission to 3/22  augmentin from 3/22    Discharge Exam: BP (!) 164/78 (BP Location: Right Arm)   Pulse 66   Temp 98 F (36.7 C) (Oral)   Resp 18   Ht 5\' 11"  (1.803 m)   Wt 71.1 kg (156 lb 12 oz)   SpO2 100%   BMI 21.86 kg/m   General: demented, NAD Cardiovascular: paced rhythm Respiratory: CTABL  Discharge Instructions You were cared for by a hospitalist during your hospital stay. If you have any questions about your discharge medications or the care you received while you were in the hospital after you are discharged, you can call the unit and asked to speak with the hospitalist on call if the hospitalist that took care of you is not available. Once you are discharged, your primary care physician will handle any further medical issues. Please note that NO REFILLS for any discharge medications  will be authorized once you are discharged, as it is imperative that you return to your primary care physician (or establish a relationship with a primary care physician if you do not have one) for your aftercare needs so that they can reassess your need for medications and monitor your lab values.  Discharge Instructions    Diet - low sodium heart healthy   Complete by:  As directed    Carb modified   Increase activity slowly   Complete by:  As directed      Allergies as of 10/22/2017      Reactions   Iodides Anaphylaxis, Other (See Comments)   Iodinated Diagnostic Agents Anaphylaxis    Iohexol Anaphylaxis    Desc: RN states pt stated he had anyphalactic shock reaction to contrast approx 10 years ago.   Other Shortness Of Breath   Ace Inhibitors Other (See Comments)   cough   Simvastatin Other (See Comments)   Muscle aches   Aspirin    Other reaction(s): Abdominal Pain      Medication List    TAKE these medications   acetaminophen 500 MG tablet Commonly known as:  TYLENOL Take 500 mg by mouth every 6 (six) hours as needed.   ALIGN PO Take 1 capsule by mouth daily.   amLODipine 5 MG tablet Commonly known as:  NORVASC Take 1 tablet (5 mg total) by mouth daily.   atenolol 50 MG tablet Commonly known as:  TENORMIN Take 50 mg by mouth 2 (two) times daily.   CALCIUM PO Take 600 mg by mouth 2 (two) times daily.   carbamide peroxide 6.5 % OTIC solution Commonly known as:  DEBROX Place 5 drops into both ears 2 (two) times daily.   COQ10 PO Take 1 capsule by mouth daily.   ezetimibe 10 MG tablet Commonly known as:  ZETIA Take 1 tablet (10 mg total) by mouth daily.   feeding supplement (GLUCERNA SHAKE) Liqd Take 237 mLs by mouth 3 (three) times daily as needed (feeding supplement).   FREESTYLE LITE TEST VI USE AS DIRECTED   furosemide 20 MG tablet Commonly known as:  LASIX Take 1 tablet (20 mg total) by mouth daily as needed. What changed:    when to take this  reasons to take this   ICAPS PO Take 1 tablet by mouth 2 (two) times daily.   MULTIVITAMIN ADULT PO Take 1 capsule by mouth daily.   Insulin Detemir 100 UNIT/ML Pen Commonly known as:  LEVEMIR Inject 8 Units into the skin daily as needed. For blood glucose per sliding scale.   IRON PO Take 1 tablet by mouth daily.   isosorbide-hydrALAZINE 20-37.5 MG tablet Commonly known as:  BIDIL Take 1 tablet by mouth 3 (three) times daily.   linagliptin 5 MG Tabs tablet Commonly known as:  TRADJENTA Take 7.5 mg by mouth daily.   mirtazapine 15 MG tablet Commonly known as:  REMERON Take  15 mg by mouth at bedtime.   polyethylene glycol packet Commonly known as:  MIRALAX / GLYCOLAX Take 17 g by mouth daily as needed for moderate constipation.   QUEtiapine 50 MG tablet Commonly known as:  SEROQUEL Take 50 mg by mouth daily. At 530 pm   QUEtiapine 25 MG tablet Commonly known as:  SEROQUEL Take 25 mg by mouth daily as needed.   SENNA PO Take 1 tablet by mouth daily.   sertraline 25 MG tablet Commonly known as:  ZOLOFT Take 25 mg by mouth daily.  sulfamethoxazole-trimethoprim 400-80 MG tablet Commonly known as:  BACTRIM Take 1 tablet by mouth 2 (two) times daily for 3 days.   ziprasidone 20 MG capsule Commonly known as:  GEODON Take 1 capsule (20 mg total) by mouth 2 (two) times daily as needed (severe agitation).      Allergies  Allergen Reactions  . Iodides Anaphylaxis and Other (See Comments)  . Iodinated Diagnostic Agents Anaphylaxis  . Iohexol Anaphylaxis     Desc: RN states pt stated he had anyphalactic shock reaction to contrast approx 10 years ago.   . Other Shortness Of Breath  . Ace Inhibitors Other (See Comments)    cough  . Simvastatin Other (See Comments)    Muscle aches  . Aspirin     Other reaction(s): Abdominal Pain   Follow-up Information    Belva Crome, MD Follow up.   Specialty:  Cardiology Why:  Keep appointment with Dr. Tamala Julian for 12/16/17.  Contact information: [address] [place] [address] [phone]        Paul Carol, MD Follow up.   Specialty:  Internal Medicine Why:  hosptial discharge follow up Contact information: [address] [place] [address] [phone]            The results of significant diagnostics from this hospitalization (including imaging, microbiology, ancillary and laboratory) are listed below for reference.    Significant Diagnostic Studies: Ct Head Wo Contrast  Result Date: 10/20/2017 CLINICAL DATA:  Stroke follow-up EXAM: CT HEAD  WITHOUT CONTRAST TECHNIQUE: Contiguous axial images were obtained from the base of the skull through the vertex without intravenous contrast. COMPARISON:  Head CT 10/19/2017 FINDINGS: Brain: No mass lesion, intraparenchymal hemorrhage or extra-axial collection. No evidence of acute cortical infarct. Periventricular hypoattenuation suggesting chronic microvascular disease, in addition to generalized atrophy without lobar predilection. Vascular: Atherosclerotic calcification of the vertebral and internal carotid arteries at the skull base. No hyperdense vessel. Skull: Normal visualized skull base, calvarium and extracranial soft tissues. Sinuses/Orbits: No sinus fluid levels or advanced mucosal thickening. No mastoid effusion. Normal orbits. IMPRESSION: Chronic ischemic microangiopathy and parenchymal volume loss without acute intracranial abnormality. Electronically Signed   By: Ulyses Jarred M.D.   On: 10/20/2017 18:45   Ct Head Wo Contrast  Result Date: 10/19/2017 CLINICAL DATA:  Altered mental status and vomiting. EXAM: CT HEAD WITHOUT CONTRAST TECHNIQUE: Contiguous axial images were obtained from the base of the skull through the vertex without intravenous contrast. COMPARISON:  CT head dated February 20, 2016. FINDINGS: Brain: No evidence of acute infarction, hemorrhage, hydrocephalus, extra-axial collection or mass lesion/mass effect. Stable atrophy and chronic microvascular ischemic changes. Unchanged lacunar infarct in the left thalamus. Vascular: Calcified atherosclerosis at the skullbase. No hyperdense vessel. Skull: Normal. Negative for fracture or focal lesion. Sinuses/Orbits: No acute finding. Other: None. IMPRESSION: 1.  No acute intracranial abnormality. 2. Stable cerebral atrophy and chronic microvascular ischemic changes. Electronically Signed   By: Titus Dubin M.D.   On: 10/19/2017 08:24   Ct Renal Stone Study  Result Date: 10/19/2017 CLINICAL DATA:  Awoke with nausea and vomiting. EXAM: CT  ABDOMEN AND PELVIS WITHOUT CONTRAST TECHNIQUE: Multidetector CT imaging of the abdomen and pelvis was performed following the standard protocol without IV contrast. COMPARISON:  CT 07/06/2015 FINDINGS: Lower chest: Small left pleural effusion, increased from prior exam. Slight progression in cardiomegaly. Dependent airspace opacities in both lungs likely atelectasis. Hepatobiliary: Cyst in the subcapsular right lobe of the liver is not  as well-defined on the current exam. No new hepatic lesion. Gallbladder physiologically distended, no calcified stone. No biliary dilatation. Pancreas: No ductal dilatation or inflammation. Spleen: Normal in size without focal abnormality. Adrenals/Urinary Tract: Mild bilateral adrenal thickening without dominant nodule. No hydronephrosis. Mild nonspecific bilateral perinephric edema appears similar to prior exam. Multiple bilateral renal cysts, few of which have thin calcified septa, unchanged from prior exam. No suspicious renal lesion. Urinary bladder mildly distended with wall thickening about the right bladder base. Stomach/Bowel: Colonic wall thickening of the ascending and splenic flexure of the colon. Equivocal wall thickening of the transverse colon which is completely nondistended. Mild adjacent pericolonic fat stranding. Multifocal colonic diverticulosis without diverticulitis. Large distal colonic stool burden with stool distending the rectum, rectal distention of 5.3 cm. Mild rectal wall thickening. No small bowel inflammation or obstruction. Stomach is nondistended. Vascular/Lymphatic: Dense aortic atherosclerosis without aneurysm. Infrarenal IVC filter in place. No adenopathy. Reproductive: Post prostatectomy. Other: Trace free fluid in the left pelvis. No free air or intra-abdominal abscess. Fat containing umbilical hernia. Musculoskeletal: Decreased bone mineral density with multilevel degenerative change throughout the spine. There are no acute or suspicious osseous  abnormalities. IMPRESSION: 1. Colonic wall thickening with mild pericolonic edema of the ascending and splenic flexure of the colon, suggesting colitis. Equivocal involvement of the transverse colon which is nondistended. 2. Large distal colonic stool burden with stool distending the rectum and mild rectal wall thickening. Question fecal impaction. 3. Minimal right bladder wall thickening, recommend correlation with urinalysis to exclude urinary tract infection. 4. Colonic diverticulosis without diverticulitis. 5. Small left pleural effusion. Dependent opacities in the lung bases likely atelectasis. 6.  Aortic Atherosclerosis (ICD10-I70.0). Electronically Signed   By: Jeb Levering M.D.   On: 10/19/2017 04:29    Microbiology: No results found for this or any previous visit (from the past 240 hour(s)).   Labs: Basic Metabolic Panel: Recent Labs  Lab 10/19/17 0212 10/20/17 0458 10/21/17 0516 10/22/17 0424  NA 141 142 139 138  K 3.5 4.0 3.9 3.6  CL 105 107 106 105  CO2 26 26 20* 23  GLUCOSE 228* 183* 183* 66  BUN 48* 39* 38* 40*  CREATININE 3.02* 2.99* 2.82* 2.80*  CALCIUM 9.6 8.8* 8.8* 9.3   Liver Function Tests: Recent Labs  Lab 10/19/17 0212 10/20/17 0458  AST 40 28  ALT 33 25  ALKPHOS 112 53  BILITOT 0.3 0.7  PROT 6.8 5.8*  ALBUMIN 3.6 3.0*   Recent Labs  Lab 10/19/17 0212  LIPASE 92*   No results for input(s): AMMONIA in the last 168 hours. CBC: Recent Labs  Lab 10/19/17 0212 10/20/17 0458 10/21/17 0516 10/22/17 0424  WBC 6.6 3.8* 5.7 6.1  NEUTROABS  --   --  3.7 4.5  HGB 10.9* 9.7* 10.6* 10.5*  HCT 31.9* 28.1* 31.2* 30.4*  MCV 89.1 89.8 89.7 88.4  PLT 144* 130* 138* 137*   Cardiac Enzymes: No results for input(s): CKTOTAL, CKMB, CKMBINDEX, TROPONINI in the last 168 hours. BNP: BNP (last 3 results) No results for input(s): BNP in the last 8760 hours.  ProBNP (last 3 results) No results for input(s): PROBNP in the last 8760 hours.  CBG: Recent Labs   Lab 10/21/17 1544 10/21/17 2143 10/21/17 2357 10/22/17 0430 10/22/17 0756  GLUCAP 152* 254* 210* 79 108*       Signed:  Florencia Reasons MD, PhD  [facility] 10/22/2017, 10:52 AM

## 2017-12-16 ENCOUNTER — Ambulatory Visit: Payer: Medicare Other | Admitting: Interventional Cardiology

## 2017-12-16 DIAGNOSIS — I25810 Atherosclerosis of coronary artery bypass graft(s) without angina pectoris: Secondary | ICD-10-CM | POA: Insufficient documentation

## 2018-02-26 ENCOUNTER — Observation Stay (HOSPITAL_COMMUNITY)
Admission: EM | Admit: 2018-02-26 | Discharge: 2018-02-28 | Disposition: A | Discharge status: Home / Self Care | Payer: Medicare Other | Attending: Internal Medicine | Admitting: Internal Medicine

## 2018-02-26 ENCOUNTER — Other Ambulatory Visit: Payer: Self-pay

## 2018-02-26 ENCOUNTER — Encounter (HOSPITAL_COMMUNITY): Payer: Self-pay | Admitting: Emergency Medicine

## 2018-02-26 DIAGNOSIS — I25810 Atherosclerosis of coronary artery bypass graft(s) without angina pectoris: Secondary | ICD-10-CM | POA: Diagnosis present

## 2018-02-26 DIAGNOSIS — E119 Type 2 diabetes mellitus without complications: Secondary | ICD-10-CM | POA: Diagnosis present

## 2018-02-26 DIAGNOSIS — R4182 Altered mental status, unspecified: Secondary | ICD-10-CM

## 2018-02-26 DIAGNOSIS — E1122 Type 2 diabetes mellitus with diabetic chronic kidney disease: Secondary | ICD-10-CM | POA: Diagnosis not present

## 2018-02-26 DIAGNOSIS — Z8546 Personal history of malignant neoplasm of prostate: Secondary | ICD-10-CM | POA: Insufficient documentation

## 2018-02-26 DIAGNOSIS — I5042 Chronic combined systolic (congestive) and diastolic (congestive) heart failure: Secondary | ICD-10-CM | POA: Diagnosis not present

## 2018-02-26 DIAGNOSIS — Z950 Presence of cardiac pacemaker: Secondary | ICD-10-CM

## 2018-02-26 DIAGNOSIS — Z886 Allergy status to analgesic agent status: Secondary | ICD-10-CM | POA: Insufficient documentation

## 2018-02-26 DIAGNOSIS — Z951 Presence of aortocoronary bypass graft: Secondary | ICD-10-CM | POA: Insufficient documentation

## 2018-02-26 DIAGNOSIS — R2681 Unsteadiness on feet: Secondary | ICD-10-CM | POA: Diagnosis not present

## 2018-02-26 DIAGNOSIS — G9341 Metabolic encephalopathy: Secondary | ICD-10-CM | POA: Diagnosis present

## 2018-02-26 DIAGNOSIS — E860 Dehydration: Secondary | ICD-10-CM | POA: Insufficient documentation

## 2018-02-26 DIAGNOSIS — I110 Hypertensive heart disease with heart failure: Secondary | ICD-10-CM | POA: Insufficient documentation

## 2018-02-26 DIAGNOSIS — Z79899 Other long term (current) drug therapy: Secondary | ICD-10-CM | POA: Insufficient documentation

## 2018-02-26 DIAGNOSIS — I2510 Atherosclerotic heart disease of native coronary artery without angina pectoris: Secondary | ICD-10-CM | POA: Diagnosis not present

## 2018-02-26 DIAGNOSIS — I25700 Atherosclerosis of coronary artery bypass graft(s), unspecified, with unstable angina pectoris: Secondary | ICD-10-CM

## 2018-02-26 DIAGNOSIS — F0150 Vascular dementia without behavioral disturbance: Secondary | ICD-10-CM

## 2018-02-26 DIAGNOSIS — Z794 Long term (current) use of insulin: Secondary | ICD-10-CM | POA: Insufficient documentation

## 2018-02-26 DIAGNOSIS — F329 Major depressive disorder, single episode, unspecified: Secondary | ICD-10-CM | POA: Diagnosis not present

## 2018-02-26 DIAGNOSIS — F0390 Unspecified dementia without behavioral disturbance: Secondary | ICD-10-CM | POA: Insufficient documentation

## 2018-02-26 DIAGNOSIS — E785 Hyperlipidemia, unspecified: Secondary | ICD-10-CM | POA: Diagnosis not present

## 2018-02-26 DIAGNOSIS — N179 Acute kidney failure, unspecified: Secondary | ICD-10-CM

## 2018-02-26 DIAGNOSIS — N184 Chronic kidney disease, stage 4 (severe): Secondary | ICD-10-CM | POA: Diagnosis not present

## 2018-02-26 DIAGNOSIS — R7989 Other specified abnormal findings of blood chemistry: Secondary | ICD-10-CM | POA: Diagnosis not present

## 2018-02-26 DIAGNOSIS — R778 Other specified abnormalities of plasma proteins: Secondary | ICD-10-CM | POA: Diagnosis present

## 2018-02-26 DIAGNOSIS — N189 Chronic kidney disease, unspecified: Secondary | ICD-10-CM

## 2018-02-26 DIAGNOSIS — I10 Essential (primary) hypertension: Secondary | ICD-10-CM

## 2018-02-26 DIAGNOSIS — Z86718 Personal history of other venous thrombosis and embolism: Secondary | ICD-10-CM | POA: Diagnosis not present

## 2018-02-26 DIAGNOSIS — F32A Depression, unspecified: Secondary | ICD-10-CM | POA: Diagnosis present

## 2018-02-26 HISTORY — DX: Presence of cardiac pacemaker: Z95.0

## 2018-02-26 LAB — CBC
HCT: 30.8 % — ABNORMAL LOW (ref 39.0–52.0)
Hemoglobin: 10.5 g/dL — ABNORMAL LOW (ref 13.0–17.0)
MCH: 31 pg (ref 26.0–34.0)
MCHC: 34.1 g/dL (ref 30.0–36.0)
MCV: 90.9 fL (ref 78.0–100.0)
PLATELETS: 131 10*3/uL — AB (ref 150–400)
RBC: 3.39 MIL/uL — ABNORMAL LOW (ref 4.22–5.81)
RDW: 12.7 % (ref 11.5–15.5)
WBC: 3.6 10*3/uL — ABNORMAL LOW (ref 4.0–10.5)

## 2018-02-26 NOTE — ED Notes (Signed)
Unable to finish triage because pt is non-verbal.

## 2018-02-26 NOTE — ED Notes (Signed)
Family arrived and advised the pt is not acting his appropriately. Family advised the pt is normally cooperative and talks. Family advised this is not his normal state.   Family answered remaining triage questions for pt.

## 2018-02-26 NOTE — ED Triage Notes (Addendum)
[facility] reports that the pt was in bed and woke up. EMS reports that the pt normally can talk and was just staring at his wife. EMS reports pt would not talk to the wife. EMS reports they attempted an IV and he had no unilateral weakness. No IV due to pt pulling away from EMS.   Pt would not look or speak, he would only look at the person calling his name.

## 2018-02-27 ENCOUNTER — Other Ambulatory Visit: Payer: Self-pay

## 2018-02-27 ENCOUNTER — Emergency Department (HOSPITAL_COMMUNITY): Payer: Medicare Other

## 2018-02-27 ENCOUNTER — Encounter (HOSPITAL_COMMUNITY): Payer: Self-pay | Admitting: *Deleted

## 2018-02-27 DIAGNOSIS — I25700 Atherosclerosis of coronary artery bypass graft(s), unspecified, with unstable angina pectoris: Secondary | ICD-10-CM

## 2018-02-27 DIAGNOSIS — N189 Chronic kidney disease, unspecified: Secondary | ICD-10-CM

## 2018-02-27 DIAGNOSIS — E1122 Type 2 diabetes mellitus with diabetic chronic kidney disease: Secondary | ICD-10-CM

## 2018-02-27 DIAGNOSIS — N183 Chronic kidney disease, stage 3 (moderate): Secondary | ICD-10-CM

## 2018-02-27 DIAGNOSIS — I5042 Chronic combined systolic (congestive) and diastolic (congestive) heart failure: Secondary | ICD-10-CM | POA: Diagnosis present

## 2018-02-27 DIAGNOSIS — N184 Chronic kidney disease, stage 4 (severe): Secondary | ICD-10-CM

## 2018-02-27 DIAGNOSIS — E785 Hyperlipidemia, unspecified: Secondary | ICD-10-CM

## 2018-02-27 DIAGNOSIS — G9341 Metabolic encephalopathy: Secondary | ICD-10-CM | POA: Diagnosis not present

## 2018-02-27 DIAGNOSIS — E119 Type 2 diabetes mellitus without complications: Secondary | ICD-10-CM | POA: Diagnosis present

## 2018-02-27 DIAGNOSIS — N179 Acute kidney failure, unspecified: Secondary | ICD-10-CM | POA: Insufficient documentation

## 2018-02-27 DIAGNOSIS — I10 Essential (primary) hypertension: Secondary | ICD-10-CM

## 2018-02-27 DIAGNOSIS — F0150 Vascular dementia without behavioral disturbance: Secondary | ICD-10-CM

## 2018-02-27 DIAGNOSIS — Z794 Long term (current) use of insulin: Secondary | ICD-10-CM

## 2018-02-27 DIAGNOSIS — R778 Other specified abnormalities of plasma proteins: Secondary | ICD-10-CM | POA: Diagnosis present

## 2018-02-27 DIAGNOSIS — Z950 Presence of cardiac pacemaker: Secondary | ICD-10-CM

## 2018-02-27 DIAGNOSIS — R748 Abnormal levels of other serum enzymes: Secondary | ICD-10-CM

## 2018-02-27 DIAGNOSIS — R7989 Other specified abnormal findings of blood chemistry: Secondary | ICD-10-CM | POA: Diagnosis present

## 2018-02-27 LAB — URINALYSIS, ROUTINE W REFLEX MICROSCOPIC
Bacteria, UA: NONE SEEN
Bilirubin Urine: NEGATIVE
GLUCOSE, UA: 50 mg/dL — AB
Ketones, ur: NEGATIVE mg/dL
Leukocytes, UA: NEGATIVE
Nitrite: NEGATIVE
PH: 8 (ref 5.0–8.0)
Protein, ur: NEGATIVE mg/dL
RBC / HPF: >50 RBC/hpf — ABNORMAL HIGH (ref 0–5)
Specific Gravity, Urine: 1.008 (ref 1.005–1.030)

## 2018-02-27 LAB — TROPONIN I
TROPONIN I: 0.04 ng/mL — AB (ref <0.03)
TROPONIN I: 0.04 ng/mL — AB (ref <0.03)
Troponin I: 0.04 ng/mL (ref <0.03)
Troponin I: 0.04 ng/mL (ref <0.03)

## 2018-02-27 LAB — CBC
HCT: 31.8 % — ABNORMAL LOW (ref 39.0–52.0)
Hemoglobin: 10.6 g/dL — ABNORMAL LOW (ref 13.0–17.0)
MCH: 30.5 pg (ref 26.0–34.0)
MCHC: 33.3 g/dL (ref 30.0–36.0)
MCV: 91.6 fL (ref 78.0–100.0)
Platelets: 129 10*3/uL — ABNORMAL LOW (ref 150–400)
RBC: 3.47 MIL/uL — ABNORMAL LOW (ref 4.22–5.81)
RDW: 12.6 % (ref 11.5–15.5)
WBC: 4.9 10*3/uL (ref 4.0–10.5)

## 2018-02-27 LAB — GLUCOSE, CAPILLARY
GLUCOSE-CAPILLARY: 146 mg/dL — AB (ref 70–99)
Glucose-Capillary: 150 mg/dL — ABNORMAL HIGH (ref 70–99)
Glucose-Capillary: 169 mg/dL — ABNORMAL HIGH (ref 70–99)

## 2018-02-27 LAB — LIPID PANEL
CHOL/HDL RATIO: 4.5 ratio
Cholesterol: 163 mg/dL (ref 0–200)
HDL: 36 mg/dL — ABNORMAL LOW (ref >40)
LDL CALC: 104 mg/dL — AB (ref 0–99)
TRIGLYCERIDES: 114 mg/dL (ref <150)
VLDL: 23 mg/dL (ref 0–40)

## 2018-02-27 LAB — COMPREHENSIVE METABOLIC PANEL
ALBUMIN: 3.3 g/dL — AB (ref 3.5–5.0)
ALK PHOS: 79 U/L (ref 38–126)
ALT: 18 U/L (ref 0–44)
AST: 22 U/L (ref 15–41)
Anion gap: 7 (ref 5–15)
BILIRUBIN TOTAL: 0.6 mg/dL (ref 0.3–1.2)
BUN: 57 mg/dL — AB (ref 8–23)
CALCIUM: 9.7 mg/dL (ref 8.9–10.3)
CO2: 25 mmol/L (ref 22–32)
CREATININE: 3.57 mg/dL — AB (ref 0.61–1.24)
Chloride: 110 mmol/L (ref 98–111)
GFR calc Af Amer: 16 mL/min — ABNORMAL LOW (ref >60)
GFR, EST NON AFRICAN AMERICAN: 14 mL/min — AB (ref >60)
Glucose, Bld: 184 mg/dL — ABNORMAL HIGH (ref 70–99)
POTASSIUM: 3.9 mmol/L (ref 3.5–5.1)
Sodium: 142 mmol/L (ref 135–145)
TOTAL PROTEIN: 6.7 g/dL (ref 6.5–8.1)

## 2018-02-27 LAB — CBG MONITORING, ED: GLUCOSE-CAPILLARY: 162 mg/dL — AB (ref 70–99)

## 2018-02-27 LAB — BASIC METABOLIC PANEL
Anion gap: 13 (ref 5–15)
BUN: 55 mg/dL — AB (ref 8–23)
CALCIUM: 9.6 mg/dL (ref 8.9–10.3)
CO2: 23 mmol/L (ref 22–32)
Chloride: 108 mmol/L (ref 98–111)
Creatinine, Ser: 3.31 mg/dL — ABNORMAL HIGH (ref 0.61–1.24)
GFR calc Af Amer: 17 mL/min — ABNORMAL LOW (ref >60)
GFR, EST NON AFRICAN AMERICAN: 15 mL/min — AB (ref >60)
Glucose, Bld: 168 mg/dL — ABNORMAL HIGH (ref 70–99)
Potassium: 4.3 mmol/L (ref 3.5–5.1)
Sodium: 144 mmol/L (ref 135–145)

## 2018-02-27 LAB — HEMOGLOBIN A1C
HEMOGLOBIN A1C: 7.3 % — AB (ref 4.8–5.6)
MEAN PLASMA GLUCOSE: 162.81 mg/dL

## 2018-02-27 LAB — BRAIN NATRIURETIC PEPTIDE: B Natriuretic Peptide: 1417.5 pg/mL — ABNORMAL HIGH (ref 0.0–100.0)

## 2018-02-27 MED ORDER — INSULIN ASPART 100 UNIT/ML ~~LOC~~ SOLN
0.0000 [IU] | Freq: Three times a day (TID) | SUBCUTANEOUS | Status: DC
  Administered 2018-02-27: 2 [IU] via SUBCUTANEOUS
  Administered 2018-02-27: 1 [IU] via SUBCUTANEOUS
  Administered 2018-02-27: 2 [IU] via SUBCUTANEOUS
  Administered 2018-02-28: 1 [IU] via SUBCUTANEOUS

## 2018-02-27 MED ORDER — BOOST PLUS PO LIQD
237.0000 mL | Freq: Two times a day (BID) | ORAL | Status: DC
  Administered 2018-02-27 – 2018-02-28 (×2): 237 mL via ORAL
  Filled 2018-02-27 (×3): qty 237

## 2018-02-27 MED ORDER — ATENOLOL 50 MG PO TABS
50.0000 mg | ORAL_TABLET | Freq: Two times a day (BID) | ORAL | Status: DC
  Administered 2018-02-27 – 2018-02-28 (×3): 50 mg via ORAL
  Filled 2018-02-27 (×3): qty 1

## 2018-02-27 MED ORDER — DIVALPROEX SODIUM 125 MG PO CSDR
125.0000 mg | DELAYED_RELEASE_CAPSULE | Freq: Two times a day (BID) | ORAL | Status: DC
  Administered 2018-02-27 – 2018-02-28 (×3): 125 mg via ORAL
  Filled 2018-02-27 (×3): qty 1

## 2018-02-27 MED ORDER — LACTATED RINGERS IV BOLUS
500.0000 mL | Freq: Once | INTRAVENOUS | Status: AC
  Administered 2018-02-27: 500 mL via INTRAVENOUS

## 2018-02-27 MED ORDER — ISOSORB DINITRATE-HYDRALAZINE 20-37.5 MG PO TABS
1.0000 | ORAL_TABLET | Freq: Three times a day (TID) | ORAL | Status: DC
  Administered 2018-02-27 – 2018-02-28 (×4): 1 via ORAL
  Filled 2018-02-27 (×3): qty 1

## 2018-02-27 MED ORDER — SODIUM CHLORIDE 0.9 % IV SOLN
INTRAVENOUS | Status: DC
  Administered 2018-02-27 – 2018-02-28 (×3): via INTRAVENOUS

## 2018-02-27 MED ORDER — HEPARIN SODIUM (PORCINE) 5000 UNIT/ML IJ SOLN
5000.0000 [IU] | Freq: Three times a day (TID) | INTRAMUSCULAR | Status: DC
  Administered 2018-02-27 (×3): 5000 [IU] via SUBCUTANEOUS
  Filled 2018-02-27 (×3): qty 1

## 2018-02-27 MED ORDER — HYDRALAZINE HCL 20 MG/ML IJ SOLN: 5.0000 mg | INTRAMUSCULAR | Status: DC | PRN

## 2018-02-27 MED ORDER — INSULIN ASPART 100 UNIT/ML ~~LOC~~ SOLN: 0.0000 [IU] | Freq: Every day | SUBCUTANEOUS | Status: DC

## 2018-02-27 MED ORDER — AMLODIPINE BESYLATE 10 MG PO TABS
10.0000 mg | ORAL_TABLET | Freq: Every day | ORAL | Status: DC
  Administered 2018-02-27 – 2018-02-28 (×2): 10 mg via ORAL
  Filled 2018-02-27 (×2): qty 1

## 2018-02-27 MED ORDER — ACETAMINOPHEN 325 MG PO TABS: 650.0000 mg | ORAL_TABLET | Freq: Four times a day (QID) | ORAL | Status: DC | PRN

## 2018-02-27 MED ORDER — ACETAMINOPHEN 650 MG RE SUPP: 650.0000 mg | Freq: Four times a day (QID) | RECTAL | Status: DC | PRN

## 2018-02-27 MED ORDER — QUETIAPINE FUMARATE 25 MG PO TABS
25.0000 mg | ORAL_TABLET | Freq: Every evening | ORAL | Status: DC
  Administered 2018-02-27: 25 mg via ORAL
  Filled 2018-02-27: qty 1

## 2018-02-27 MED ORDER — ORAL CARE MOUTH RINSE
15.0000 mL | Freq: Two times a day (BID) | OROMUCOSAL | Status: DC
  Administered 2018-02-27 – 2018-02-28 (×2): 15 mL via OROMUCOSAL

## 2018-02-27 MED ORDER — HYDROXYZINE HCL 50 MG/ML IM SOLN
25.0000 mg | Freq: Four times a day (QID) | INTRAMUSCULAR | Status: DC | PRN
  Filled 2018-02-27: qty 0.5

## 2018-02-27 MED ORDER — MORPHINE SULFATE (PF) 4 MG/ML IV SOLN
1.0000 mg | INTRAVENOUS | Status: DC | PRN
Start: 2018-02-27 — End: 2018-02-28

## 2018-02-27 MED ORDER — SERTRALINE HCL 50 MG PO TABS
25.0000 mg | ORAL_TABLET | Freq: Every day | ORAL | Status: DC
  Administered 2018-02-27 – 2018-02-28 (×2): 25 mg via ORAL
  Filled 2018-02-27 (×2): qty 1

## 2018-02-27 NOTE — H&P (Signed)
History and Physical    [name] IRJ:[id number] DOB: 15-Jun-1923 DOA: 02/26/2018  Referring MD/NP/PA:   PCP: [name], MD   Patient coming from:  The patient is coming from home.  At baseline, pt is partially dependent for most of ADL.     Chief Complaint: AMS  HPI: [name] is a 84 y.o. male with medical history significant of dementia, hypertension, hyperlipidemia, diabetes mellitus, CAD, CABG, CHF with EF of 30%, CKD-4, DVT, prostate cancer, pacemaker placement due to tachybradycardia syndrome, who presents with altered mental status.  Per pt's wife, patient has history of dementia, at baseline he knows place and her wife's name, most of the time he is interactive with family members, but normally not oriented to time. Pt is more confused since yesterday, less responsive, stopped talking, has decreased oral intake, not oriented x3.  Per his wife, patient does not seem to have any pain anywhere.  He moves all extremities.  He does not have chest pain, abdominal pain, no active cough, respiratory distress, nausea, vomiting, diarrhea noted.  Not sure if patient has any symptoms of UTI.  ED Course: pt was found to have WBC 3.6, troponin 0.04, worsening renal function, negative urinalysis, temperature normal, no tachycardia, oxygen saturation 100% on room air, negative chest x-ray, negative CT head for acute intracranial abnormalities.  Patient is placed on telemetry bed for observation.  Review of Systems: Could not be reviewed due to altered mental status.  Allergy:  Allergies  Allergen Reactions  . Iodides Anaphylaxis and Other (See Comments)  . Iodinated Diagnostic Agents Anaphylaxis  . Iohexol Anaphylaxis     Desc: RN states pt stated he had anyphalactic shock reaction to contrast approx 10 years ago.   . Other Shortness Of Breath  . Ace Inhibitors Other (See Comments)    cough  . Simvastatin Other (See Comments)    Muscle aches  . Aspirin     Other  reaction(s): Abdominal Pain    Past Medical History:  Diagnosis Date  . CAD (coronary artery disease)    a. CABG 1991 and 2006 LIMA LAD, seq. SVG D1-Cfx, SVG d2,and seq. SVG AM-PDA   . Chronic systolic CHF (congestive heart failure) ([place])   . CKD (chronic kidney disease)   . Dementia   . Depression   . Diabetes mellitus   . HTN (hypertension)   . Left leg DVT ([place])   . Presence of permanent cardiac pacemaker   . Prostate cancer ([place])   . Tachycardia-bradycardia syndrome [facility])     Past Surgical History:  Procedure Laterality Date  . CORONARY ARTERY BYPASS GRAFT    . PROSTATECTOMY      Social History:  reports that he has never smoked. He has never used smokeless tobacco. He reports that he does not drink alcohol or use drugs.  Family History:  Family History  Problem Relation Age of Onset  . Diabetes Mother   . Stroke Mother   . Heart disease Sister   . Heart disease Sister   . Heart disease Sister   . Heart disease Sister      Prior to Admission medications   Medication Sig Start Date End Date Taking? Authorizing Provider  acetaminophen (TYLENOL) 500 MG tablet Take 500 mg by mouth every 6 (six) hours as needed.   Yes [provider]  amLODipine (NORVASC) 10 MG tablet Take 10 mg by mouth daily. 01/22/18  Yes [provider]  atenolol (TENORMIN) 50 MG tablet Take 50  mg by mouth 2 (two) times daily.    Yes [provider]  CALCIUM PO Take 600 mg by mouth 2 (two) times daily.    Yes [provider]  Coenzyme Q10 (COQ10 PO) Take 1 capsule by mouth daily.   Yes [provider]  divalproex (DEPAKOTE SPRINKLE) 125 MG capsule Take 125 mg by mouth 2 (two) times daily. 02/21/18  Yes [provider]  ezetimibe (ZETIA) 10 MG tablet Take 1 tablet (10 mg total) by mouth daily. 10/22/17  Yes Florencia Reasons, MD  feeding supplement, GLUCERNA SHAKE, (GLUCERNA SHAKE) LIQD Take 237 mLs by mouth 3 (three) times daily as needed (feeding  supplement).   Yes [provider]  furosemide (LASIX) 20 MG tablet Take 1 tablet (20 mg total) by mouth daily as needed. 10/22/17  Yes Florencia Reasons, MD  insulin glargine (LANTUS) 100 UNIT/ML injection Inject 6-18 Units into the skin at bedtime. Sliding scale   Yes [provider]  IRON PO Take 1 tablet by mouth daily.   Yes [provider]  isosorbide-hydrALAZINE (BIDIL) 20-37.5 MG tablet Take 1 tablet by mouth 3 (three) times daily. 11/15/15  Yes Rivet, Sindy Gu[place], MD  linagliptin (TRADJENTA) 5 MG TABS tablet Take 7.5 mg by mouth daily.   Yes [provider]  mirtazapine (REMERON) 7.5 MG tablet Take 7.5 mg by mouth at bedtime. 02/23/18  Yes [provider]  Multiple Vitamins-Minerals (ICAPS PO) Take 1 tablet by mouth 2 (two) times daily.   Yes [provider]  Multiple Vitamins-Minerals (MULTIVITAMIN ADULT PO) Take 1 capsule by mouth daily.   Yes [provider]  polyethylene glycol (MIRALAX / GLYCOLAX) packet Take 17 g by mouth daily as needed for moderate constipation.   Yes [provider]  Probiotic Product (ALIGN PO) Take 1 capsule by mouth daily.   Yes [provider]  QUEtiapine (SEROQUEL) 25 MG tablet Take 25 mg by mouth every evening.    Yes [provider]  SENNA PO Take 1 tablet by mouth daily.   Yes [provider]  sertraline (ZOLOFT) 25 MG tablet Take 25 mg by mouth daily.    Yes [provider]  amLODipine (NORVASC) 5 MG tablet Take 1 tablet (5 mg total) by mouth daily. Patient not taking: Reported on 02/27/2018 09/21/16   Rosita Fire, MD  carbamide peroxide (DEBROX) 6.5 % OTIC solution Place 5 drops into both ears 2 (two) times daily. Patient not taking: Reported on 02/27/2018 10/22/17   Florencia Reasons, MD  Glucose Blood (FREESTYLE LITE TEST VI) USE AS DIRECTED 06/02/13   [provider]  ziprasidone (GEODON) 20 MG capsule Take 1 capsule (20 mg total) by mouth 2 (two) times  daily as needed (severe agitation). Patient not taking: Reported on 02/27/2018 10/22/17 02/27/26  Florencia Reasons, MD    Physical Exam: Vitals:   02/27/18 0247 02/27/18 0303 02/27/18 0541 02/27/18 0548  BP: (!) 182/82 (!) 164/80  (!) 157/69  Pulse: 79 75  60  Resp: 19 (!) 21  18  Temp:    98.4 F (36.9 C)  TempSrc:    Oral  SpO2: 100% 100%    Weight:   65.2 kg (143 lb 11.8 oz)   Height:   5\' 9"  (1.753 m)    General: Not in acute distress.  Dry mucous and membrane HEENT:       Eyes: PERRL, EOMI, no scleral icterus.       ENT: No discharge from the ears  and nose, no pharynx injection, no tonsillar enlargement.        Neck: No JVD, no bruit, no mass felt. Heme: No neck lymph node enlargement. Cardiac: S1/S2, RRR, No murmurs, No gallops or rubs. Respiratory: No rales, wheezing, rhonchi or rubs. GI: Soft, nondistended, nontender, no organomegaly, BS present. GU: No hematuria Ext: No pitting leg edema bilaterally. 2+DP/PT pulse bilaterally. Musculoskeletal: No joint deformities, No joint redness or warmth, no limitation of ROM in spin. Skin: No rashes.  Neuro: confused, not oriented X3, cranial nerves II-XII grossly intact, moves all extremities. Psych: Patient is not psychotic, no suicidal or hemocidal ideation.  Labs on Admission: I have personally reviewed following labs and imaging studies  CBC: Recent Labs  Lab 02/26/18 2330 02/27/18 0440  WBC 3.6* 4.9  HGB 10.5* 10.6*  HCT 30.8* 31.8*  MCV 90.9 91.6  PLT 131* 003*   Basic Metabolic Panel: Recent Labs  Lab 02/26/18 2330 02/27/18 0440  NA 142 144  K 3.9 4.3  CL 110 108  CO2 25 23  GLUCOSE 184* 168*  BUN 57* 55*  CREATININE 3.57* 3.31*  CALCIUM 9.7 9.6   GFR: Estimated Creatinine Clearance: 13.1 mL/min (A) (by C-G formula based on SCr of 3.31 mg/dL (H)). Liver Function Tests: Recent Labs  Lab 02/26/18 2330  AST 22  ALT 18  ALKPHOS 79  BILITOT 0.6  PROT 6.7  ALBUMIN 3.3*   No results for input(s): LIPASE,  AMYLASE in the last 168 hours. No results for input(s): AMMONIA in the last 168 hours. Coagulation Profile: No results for input(s): INR, PROTIME in the last 168 hours. Cardiac Enzymes: Recent Labs  Lab 02/27/18 0025 02/27/18 0440  TROPONINI 0.04* 0.04*   BNP (last 3 results) No results for input(s): PROBNP in the last 8760 hours. HbA1C: Recent Labs    02/27/18 0440  HGBA1C 7.3*   CBG: Recent Labs  Lab 02/27/18 0148  GLUCAP 162*   Lipid Profile: Recent Labs    02/27/18 0440  CHOL 163  HDL 36*  LDLCALC 104*  TRIG 114  CHOLHDL 4.5   Thyroid Function Tests: No results for input(s): TSH, T4TOTAL, FREET4, T3FREE, THYROIDAB in the last 72 hours. Anemia Panel: No results for input(s): VITAMINB12, FOLATE, FERRITIN, TIBC, IRON, RETICCTPCT in the last 72 hours. Urine analysis:    Component Value Date/Time   COLORURINE YELLOW 02/27/2018 0112   APPEARANCEUR HAZY (A) 02/27/2018 0112   LABSPEC 1.008 02/27/2018 0112   PHURINE 8.0 02/27/2018 0112   GLUCOSEU 50 (A) 02/27/2018 0112   HGBUR LARGE (A) 02/27/2018 0112   BILIRUBINUR NEGATIVE 02/27/2018 0112   BILIRUBINUR negative 03/22/2017 1243   KETONESUR NEGATIVE 02/27/2018 0112   PROTEINUR NEGATIVE 02/27/2018 0112   UROBILINOGEN 0.2 03/22/2017 1243   UROBILINOGEN 1.0 09/23/2009 0124   NITRITE NEGATIVE 02/27/2018 0112   LEUKOCYTESUR NEGATIVE 02/27/2018 0112   Sepsis Labs: @LABRCNTIP (procalcitonin:4,lacticidven:4) )No results found for this or any previous visit (from the past 240 hour(s)).   Radiological Exams on Admission: Ct Head Wo Contrast  Result Date: 02/27/2018 CLINICAL DATA:  Altered level of consciousness. EXAM: CT HEAD WITHOUT CONTRAST TECHNIQUE: Contiguous axial images were obtained from the base of the skull through the vertex without intravenous contrast. COMPARISON:  Head CT 10/20/2017 FINDINGS: Brain: Unchanged degree of diffuse atrophy. Unchanged moderate to advanced chronic small vessel ischemia. No  intracranial hemorrhage, mass effect, or midline shift. No hydrocephalus. The basilar cisterns are patent. No evidence of territorial infarct or acute ischemia. No extra-axial or intracranial fluid  collection. Vascular: Atherosclerosis of skull base vasculature. No hyperdense vessel. Skull: No fracture or focal lesion. Sinuses/Orbits: Unchanged mucosal thickening of sphenoid sinuses. Unchanged minimal opacification of lower left mastoid air cells. Bilateral cataract resection. Other: None. IMPRESSION: 1.  No acute intracranial abnormality. 2. Unchanged atrophy and chronic small vessel ischemia. Electronically Signed   By: Jeb Levering M.D.   On: 02/27/2018 02:03   Dg Chest Port 1 View  Result Date: 02/27/2018 CLINICAL DATA:  Shortness of breath, evaluate for pulmonary edema EXAM: PORTABLE CHEST 1 VIEW COMPARISON:  09/15/2016 FINDINGS: Lungs are essentially clear. No frank interstitial edema. Mild bibasilar scarring/atelectasis. No pleural effusion or pneumothorax. The heart is mildly enlarged. Postsurgical changes related to prior CABG. Left subclavian pacemaker. Median sternotomy. IMPRESSION: No evidence of acute cardiopulmonary disease. No frank interstitial edema. Electronically Signed   By: Julian Hy M.D.   On: 02/27/2018 00:57     EKG: Independently reviewed.  Seems to be paced rhythm, QTC 517, widening QRS wave, bifascicular block   Assessment/Plan Principal Problem:   Acute metabolic encephalopathy Active Problems:   Pacemaker   Hyperlipidemia   Dementia   Essential hypertension   Depression   CKD stage 4 due to type 2 diabetes mellitus (HCC)   CAD (coronary artery disease) of artery bypass graft   Type II diabetes mellitus with renal manifestations (HCC)   Chronic combined systolic (congestive) and diastolic (congestive) heart failure (HCC)   Elevated troponin    Acute metabolic encephalopathy: Etiology is not clear.  CT head is negative for acute intracranial  abnormalities.  Urinalysis and chest x-ray negative.  No fever.  Does not seem to have infection.  Patient moves all extremities.  No focal neurologic findings on physical examination. His troponin is mildly elevated at 0.04, which seem to be a chronic issue in the setting of CKD-IV.  Patient had troponin 0.34 on 09/16/2016.  Patient does not seem to have chest pain or shortness of breath.  Less likely to have ACS. Likely due to multifactorial etiology, including delirium, worsening renal function, dehydration.Pt has pacemaker placement, cannot do MRI for brain.  -will place in tele bed for obs -Frequent neuro check -Gentle IV fluid: Patient was given 500 cc of Ringer's solution in ED, will continue 75 cc/h -Hold all oral medications until mental status improves  Hyperlipidemia: -hold zetia  Dementia: Not on medications at home.  No agitation -Frequent neuro check  Essential hypertension: -Hold home medication  - hydralazine as needed    Depression: --Hold Home medication   CKD stage 4 due to type 2 diabetes mellitus ([place]): Slightly worsening.  Baseline creatinine 2.8-3.0, his creatinine is a 3.57, BUN 57.  Likely due to dehydration and continuation of Lasix -IV fluid as above -Follow-up renal function by BMP -Hold Lasix   CAD (coronary artery disease) of artery bypass graft: Patient does not seem to have chest pain or shortness of breath. -Hold Zetia, atenolol -pt is allergic to ASA  Elevated trop:  troponin is minimally elevated 0.04 in the setting of CKD-IV. Patient does not seem to have chest pain. Less likley to have ACS. This is seem to be a chronic issue.  Patient had elevated troponin 0.34 on 09/16/2016. -Trend troponin x3 -check A1c and FLP -As needed morphine if developed chest pain  Type II diabetes mellitus with renal manifestations ([place]): Last A1c 7.4 on 10/19/17, not well controled. Patient is taking present in the [facility] at [facility] at home -SSI  Chronic combined systolic  (congestive)  and diastolic (congestive) heart failure ([place]): 2d echo on 09/16/2016 showed EF 30-35% with grade 2 diastolic dysfunction.  Patient does not have leg edema or JVD.  No pulmonary edema on chest x-ray. Patient seems to be dry on physical examination. -Hold Lasix. -Check BNP  DVT ppx: SQ Heparin    Code Status: Full code Family Communication:  Yes, patient's  wife at bed side Disposition Plan:  Anticipate discharge back to previous home environment Consults called:  none Admission status: Obs / tele    Date of Service 02/27/2018    Ivor Costa [facility] Pager [phone]  If 7PM-7AM, please contact night-coverage [email or web address] Password [email or web address] 02/27/2018, 7:06 AM

## 2018-02-27 NOTE — Progress Notes (Signed)
Initial Nutrition Assessment  DOCUMENTATION CODES:   Not applicable  INTERVENTION:   Boost Plus chocolate BID- Each supplement provides 360kcal and 14g protein.    NUTRITION DIAGNOSIS:   Increased nutrient needs related to acute illness as evidenced by estimated needs.  GOAL:   Patient will meet greater than or equal to 90% of their needs  MONITOR:   PO intake, Supplement acceptance, Labs, Weight trends  REASON FOR ASSESSMENT:   Malnutrition Screening Tool    ASSESSMENT:   Patient with PMH significant for dementia, HTN, HLD, DM, CAD, CABG, CHF, CKD-4, DVT, prostate cancer, and pacemaker placement. Presents this admission acute metabolic encephalopathy form unclear etiology.   Spoke with caregiver at bedside. Caregiver reports pt lives with his wife and that there is always a caregiver at their house. Two large meals are prepared for them daily and pt eats majority of each. Meals include a protein, vegetable, and grain. He does not consume meals in one sitting, he usually breaks them into three portions. She hasn't noticed a decrease in appetite despite being altered. Pt drinks Boost in his coffee and throughout the day. Denies any swallowing issues. He consumed 100% of his roast beef, 25% of his greens, and 25% of his mashed potatoes without complication. Caregiver requesting Boost. RD to order.   Caregiver unsure of pt's body weight or of any recent wt loss. Records indicate pt weighed 156 lb 10/22/17 and 143 lb this admission. Given CHF history it is hard to determine dry wt loss from fluid loss. Nutrition-Focused physical exam completed. Suspect muscle depletions are the result of advanced age.   Medications reviewed and include: NS @ 75 ml/hr Labs reviewed.   NUTRITION - FOCUSED PHYSICAL EXAM:    Most Recent Value  Orbital Region  No depletion  Upper Arm Region  Mild depletion  Thoracic and Lumbar Region  Unable to assess  Buccal Region  No depletion  Temple Region   Mild depletion  Clavicle Bone Region  Moderate depletion  Clavicle and Acromion Bone Region  Mild depletion  Scapular Bone Region  Unable to assess  Dorsal Hand  No depletion  Patellar Region  Moderate depletion  Anterior Thigh Region  Moderate depletion  Posterior Calf Region  Moderate depletion  Edema (RD Assessment)  Mild  Hair  Reviewed  Eyes  Reviewed  Mouth  Reviewed  Skin  Reviewed  Nails  Reviewed     Diet Order:   Diet Order           Diet Heart Room service appropriate? Yes; Fluid consistency: Thin  Diet effective now          EDUCATION NEEDS:   Not appropriate for education at this time  Skin:  Skin Assessment: Reviewed RN Assessment  Last BM:  02/25/18  Height:   Ht Readings from Last 1 Encounters:  02/27/18 5\' 9"  (1.753 m)    Weight:   Wt Readings from Last 1 Encounters:  02/27/18 143 lb 11.8 oz (65.2 kg)    Ideal Body Weight:  72.7 kg  BMI:  Body mass index is 21.23 kg/m.  Estimated Nutritional Needs:   Kcal:  1800-2000 kcal  Protein:  90-100 grams  Fluid:  >1.8 L/day    Mariana Single RD, LDN Clinical Nutrition Pager # - [phone]

## 2018-02-27 NOTE — ED Notes (Signed)
Patient transported to CT 

## 2018-02-27 NOTE — ED Provider Notes (Signed)
[name] EM[facility]MENT Provider Note   CSN: [id number] Arrival date & time: 02/26/18  2251     History   Chief Complaint Chief Complaint  Patient presents with  . Altered Mental Status    Hx of dementia    HPI [name] is a 84 y.o. male.  HPI Level 5 caveat for severe dementia.  84 y.o.malewith medical history significant for coronary artery disease, diabetes mellitus-insulin-dependent, hypertension, hyperlipidemia and CKD, CHF comes in with cc of ams.  Patient's wife notes that patient has been acting differently starting about few hours ago.  Patient is less responsive than usual.  Patient had a supper around 3 PM, and he was doing well.  At baseline patient is able to respond to some questions, and at the minimum he would nod yes or no -but he was not doing those activities.  Patient has had a history of UTIs in the past which has led to altered mental status.  She is unaware of any stroke.   Past Medical History:  Diagnosis Date  . CAD (coronary artery disease)    a. CABG 1991 and 2006 LIMA LAD, seq. SVG D1-Cfx, SVG d2,and seq. SVG AM-PDA   . Chronic systolic CHF (congestive heart failure) ([place])   . CKD (chronic kidney disease)   . Dementia   . Depression   . Diabetes mellitus   . HTN (hypertension)   . Left leg DVT ([place])   . Prostate cancer ([place])   . Tachycardia-bradycardia syndrome [facility])     Patient Active Problem List   Diagnosis Date Noted  . CAD (coronary artery disease) of artery bypass graft 12/16/2017  . Colitis 10/19/2017  . Globus pharyngeus 01/03/2017  . Gastroesophageal reflux disease without esophagitis 01/03/2017  . Palliative care encounter   . Community acquired pneumonia of left lower lobe of lung ([place])   . Left lower lobe pneumonia ([place]) 09/15/2016  . Nausea and vomiting 09/15/2016  . Essential hypertension 09/15/2016  . Depression 09/15/2016  . Controlled type 2 diabetes mellitus with diabetic  nephropathy, without long-term current use of insulin ([place]) 09/15/2016  . CKD stage 4 due to type 2 diabetes mellitus ([place]) 09/15/2016  . Influenza with pneumonia 09/15/2016  . Acute on chronic combined systolic and diastolic heart failure ([place]) 09/15/2016  . Bilateral hearing loss 04/27/2016  . ICH (intracerebral hemorrhage) ([place]) 02/09/2016  . Dementia 06/06/2014  . Hyperlipidemia 04/18/2012  . Prostate cancer ([place]) 04/18/2012  . Cognitive deficits 10/07/2011  . Pacemaker     Past Surgical History:  Procedure Laterality Date  . CORONARY ARTERY BYPASS GRAFT    . PROSTATECTOMY          Home Medications    Prior to Admission medications   Medication Sig Start Date End Date Taking? Authorizing Provider  acetaminophen (TYLENOL) 500 MG tablet Take 500 mg by mouth every 6 (six) hours as needed.   Yes [provider]  amLODipine (NORVASC) 10 MG tablet Take 10 mg by mouth daily. 01/22/18  Yes [provider]  atenolol (TENORMIN) 50 MG tablet Take 50 mg by mouth 2 (two) times daily.    Yes [provider]  CALCIUM PO Take 600 mg by mouth 2 (two) times daily.    Yes [provider]  Coenzyme Q10 (COQ10 PO) Take 1 capsule by mouth daily.   Yes [provider]  divalproex (DEPAKOTE SPRINKLE) 125 MG capsule Take 125 mg by mouth 2 (two) times daily. 02/21/18  Yes  [provider]  ezetimibe (ZETIA) 10 MG tablet Take 1 tablet (10 mg total) by mouth daily. 10/22/17  Yes Florencia Reasons, MD  feeding supplement, GLUCERNA SHAKE, (GLUCERNA SHAKE) LIQD Take 237 mLs by mouth 3 (three) times daily as needed (feeding supplement).   Yes [provider]  furosemide (LASIX) 20 MG tablet Take 1 tablet (20 mg total) by mouth daily as needed. 10/22/17  Yes Florencia Reasons, MD  insulin glargine (LANTUS) 100 UNIT/ML injection Inject 6-18 Units into the skin at bedtime. Sliding scale   Yes [provider]  IRON PO Take 1 tablet by mouth daily.   Yes [provider]  isosorbide-hydrALAZINE (BIDIL) 20-37.5 MG tablet Take 1 tablet by mouth 3 (three) times daily. 11/15/15  Yes Rivet, Sindy [place], MD  linagliptin (TRADJENTA) 5 MG TABS tablet Take 7.5 mg by mouth daily.   Yes [provider]  mirtazapine (REMERON) 7.5 MG tablet Take 7.5 mg by mouth at bedtime. 02/23/18  Yes [provider]  Multiple Vitamins-Minerals (ICAPS PO) Take 1 tablet by mouth 2 (two) times daily.   Yes [provider]  Multiple Vitamins-Minerals (MULTIVITAMIN ADULT PO) Take 1 capsule by mouth daily.   Yes [provider]  polyethylene glycol (MIRALAX / GLYCOLAX) packet Take 17 g by mouth daily as needed for moderate constipation.   Yes [provider]  Probiotic Product (ALIGN PO) Take 1 capsule by mouth daily.   Yes [provider]  QUEtiapine (SEROQUEL) 25 MG tablet Take 25 mg by mouth every evening.    Yes [provider]  SENNA PO Take 1 tablet by mouth daily.   Yes [provider]  sertraline (ZOLOFT) 25 MG tablet Take 25 mg by mouth daily.    Yes [provider]  amLODipine (NORVASC) 5 MG tablet Take 1 tablet (5 mg total) by mouth daily. Patient not taking: Reported on 02/27/2018 09/21/16   Rosita Fire, MD  carbamide peroxide (DEBROX) 6.5 % OTIC solution Place 5 drops into both ears 2 (two) times daily. Patient not taking: Reported on 02/27/2018 10/22/17   Florencia Reasons, MD  Glucose Blood (FREESTYLE LITE TEST VI) USE AS DIRECTED 06/02/13   [provider]  ziprasidone (GEODON) 20 MG capsule Take 1 capsule (20 mg total) by mouth 2 (two) times daily as needed (severe agitation). Patient not taking: Reported on 02/27/2018 10/22/17 02/27/26  Florencia Reasons, MD    Family History Family History  Problem Relation Age of Onset  . Diabetes Mother   . Heart disease Sister   . Heart disease Sister   . Heart disease Sister   . Heart disease Sister     Social History Social History   Tobacco  Use  . Smoking status: Never Smoker  . Smokeless tobacco: Never Used  Substance Use Topics  . Alcohol use: No    Alcohol/week: 0.0 oz  . Drug use: No     Allergies   Iodides; Iodinated diagnostic agents; Iohexol; Other; Ace inhibitors; Simvastatin; and Aspirin   Review of Systems Review of Systems  Unable to perform ROS: Dementia     Physical Exam Updated Vital Signs BP (!) 172/76   Pulse 66   Temp 98 F (36.7 C) (Oral)   Resp 17   Ht 5\' 9"  (1.753 m)   Wt 70.8 kg (156 lb)   SpO2 100%   BMI 23.04 kg/m   Physical Exam  Constitutional: He appears well-developed.  HENT:  Head: Atraumatic.  Neck: Neck  supple.  Cardiovascular: Normal rate.  Pulmonary/Chest: Effort normal.  Neurological:  Patient responded to noxious stimuli and was able to recognize his wife.  Patient is noted to be moving all 4 extremities, left lower extremity movement was these prominent.  Gross sensory exam appears to be normal as well. No facial deformity.  Skin: Skin is warm.  Nursing note and vitals reviewed.    ED Treatments / Results  Labs (all labs ordered are listed, but only abnormal results are displayed) Labs Reviewed  COMPREHENSIVE METABOLIC PANEL - Abnormal; Notable for the following components:      Result Value   Glucose, Bld 184 (*)    BUN 57 (*)    Creatinine, Ser 3.57 (*)    Albumin 3.3 (*)    GFR calc non Af Amer 14 (*)    GFR calc Af Amer 16 (*)    All other components within normal limits  CBC - Abnormal; Notable for the following components:   WBC 3.6 (*)    RBC 3.39 (*)    Hemoglobin 10.5 (*)    HCT 30.8 (*)    Platelets 131 (*)    All other components within normal limits  TROPONIN I - Abnormal; Notable for the following components:   Troponin I 0.04 (*)    All other components within normal limits  URINALYSIS, ROUTINE W REFLEX MICROSCOPIC - Abnormal; Notable for the following components:   APPearance HAZY (*)    Glucose, UA 50 (*)    Hgb urine dipstick  LARGE (*)    RBC / HPF >50 (*)    Non Squamous Epithelial 0-5 (*)    All other components within normal limits  CBG MONITORING, ED - Abnormal; Notable for the following components:   Glucose-Capillary 162 (*)    All other components within normal limits  URINE CULTURE    EKG EKG Interpretation  Date/Time:  Monday February 27 2018 01:51:05 EDT Ventricular Rate:  65 PR Interval:    QRS Duration: 186 QT Interval:  497 QTC Calculation: 517 R Axis:   -70 Text Interpretation:  Junctional rhythm IVCD, consider atypical RBBB LVH with IVCD and secondary repol abnrm Prolonged QT interval previos rhythm were paced Confirmed by Varney Biles ([id number]) on 02/27/2018 2:36:44 AM   Radiology Ct Head Wo Contrast  Result Date: 02/27/2018 CLINICAL DATA:  Altered level of consciousness. EXAM: CT HEAD WITHOUT CONTRAST TECHNIQUE: Contiguous axial images were obtained from the base of the skull through the vertex without intravenous contrast. COMPARISON:  Head CT 10/20/2017 FINDINGS: Brain: Unchanged degree of diffuse atrophy. Unchanged moderate to advanced chronic small vessel ischemia. No intracranial hemorrhage, mass effect, or midline shift. No hydrocephalus. The basilar cisterns are patent. No evidence of territorial infarct or acute ischemia. No extra-axial or intracranial fluid collection. Vascular: Atherosclerosis of skull base vasculature. No hyperdense vessel. Skull: No fracture or focal lesion. Sinuses/Orbits: Unchanged mucosal thickening of sphenoid sinuses. Unchanged minimal opacification of lower left mastoid air cells. Bilateral cataract resection. Other: None. IMPRESSION: 1.  No acute intracranial abnormality. 2. Unchanged atrophy and chronic small vessel ischemia. Electronically Signed   By: Jeb Levering M.D.   On: 02/27/2018 02:03   Dg Chest Port 1 View  Result Date: 02/27/2018 CLINICAL DATA:  Shortness of breath, evaluate for pulmonary edema EXAM: PORTABLE CHEST 1 VIEW COMPARISON:   09/15/2016 FINDINGS: Lungs are essentially clear. No frank interstitial edema. Mild bibasilar scarring/atelectasis. No pleural effusion or pneumothorax. The heart is mildly enlarged. Postsurgical changes related to prior  CABG. Left subclavian pacemaker. Median sternotomy. IMPRESSION: No evidence of acute cardiopulmonary disease. No frank interstitial edema. Electronically Signed   By: Julian Hy M.D.   On: 02/27/2018 00:57    Procedures Procedures (including critical care time)  Medications Ordered in ED Medications  lactated ringers bolus 500 mL (0 mLs Intravenous Stopped 02/27/18 0223)     Initial Impression / Assessment and Plan / ED Course  I have reviewed the triage vital signs and the nursing notes.  Pertinent labs & imaging results that were available during my care of the patient were reviewed by me and considered in my medical decision making (see chart for details).  Clinical Course as of Feb 28 236  Mon Feb 27, 2018  0037 Patient is acute on chronic CKD.  He also has history of severe CHF. Likely patient will need admission for optimization.  Creatinine(!): 3.57 [AN]    Clinical Course User Index [AN] Varney Biles, MD    DDx includes: ICH / Stroke ACS Sepsis syndrome Infection - UTI/Pneumonia Encephalopathy  Electrolyte abnormality Medication side effects DKA Metabolic disorders including thyroid disorders, adrenal insufficiency  84 year old male brought to the ER with chief complaint of altered mental status.  He has history of dementia, and unable to give any history.  Seems like patient is less active and alert than usual according to patient's wife.  Patient was last normal earlier in the evening.  Basic labs have been ordered along with urinalysis and a CT scan of the head.   Final Clinical Impressions(s) / ED Diagnoses   Final diagnoses:  Acute renal failure superimposed on chronic kidney disease, unspecified CKD stage, unspecified acute renal  failure type ([place])  Altered mental status, unspecified altered mental status type    ED Discharge Orders    None       Varney Biles, MD 02/27/18 [id number]

## 2018-02-27 NOTE — Plan of Care (Signed)
84 year old male with a history of vascular dementia lives at home with his wife admitted this morning with change in mental status.  As of now the call because of change in mental status is unknown.  Patient with history of dementia.  For infection is negative.  This morning he is awake very hard of hearing wife at the bedside.  Wife concerned about stopping Seroquel and Depakote and wanting to restart those medications.  Since patient is hemodynamically stable I will restart his home medications the wife also has given me # her daughter at [phone] to  Discuss the care of her father with her.

## 2018-02-27 NOTE — ED Notes (Signed)
IV attempts x2 unsuccessful.

## 2018-02-28 DIAGNOSIS — G9341 Metabolic encephalopathy: Secondary | ICD-10-CM

## 2018-02-28 LAB — BASIC METABOLIC PANEL
ANION GAP: 8 (ref 5–15)
BUN: 51 mg/dL — ABNORMAL HIGH (ref 8–23)
CALCIUM: 9 mg/dL (ref 8.9–10.3)
CO2: 24 mmol/L (ref 22–32)
Chloride: 112 mmol/L — ABNORMAL HIGH (ref 98–111)
Creatinine, Ser: 3.36 mg/dL — ABNORMAL HIGH (ref 0.61–1.24)
GFR, EST AFRICAN AMERICAN: 17 mL/min — AB (ref >60)
GFR, EST NON AFRICAN AMERICAN: 15 mL/min — AB (ref >60)
Glucose, Bld: 144 mg/dL — ABNORMAL HIGH (ref 70–99)
Potassium: 3.8 mmol/L (ref 3.5–5.1)
Sodium: 144 mmol/L (ref 135–145)

## 2018-02-28 LAB — URINE CULTURE: CULTURE: NO GROWTH

## 2018-02-28 LAB — GLUCOSE, CAPILLARY
GLUCOSE-CAPILLARY: 180 mg/dL — AB (ref 70–99)
Glucose-Capillary: 144 mg/dL — ABNORMAL HIGH (ref 70–99)

## 2018-02-28 MED ORDER — QUETIAPINE FUMARATE 25 MG PO TABS: 12.5000 mg | ORAL_TABLET | Freq: Every day | ORAL | 0 refills | Status: AC

## 2018-02-28 NOTE — Progress Notes (Signed)
Discharge teaching complete. Meds, diet, activity and follow up appointments reviewed and all questions answered. Copy of instructions given to wife. Patient discharged home via wheelchair with wife and caregiver. Wife refused patient to be sent home by ambulance.

## 2018-02-28 NOTE — Care Management Note (Signed)
Case Management Note  Patient Details  Name: [name] MRN: [id number] Date of Birth: 1923/03/05  Subjective/Objective:                    Action/Plan:  Confirmed face sheet information with wife at bedside. Patient from home with wife and caregivers through [place] .  Patient wife plans to transport patient home via car. Will place PTAR paperwork in shadow chart in case needed. Expected Discharge Date:  02/28/18               Expected Discharge Plan:  [place]  In-House Referral:     Discharge planning Services  CM Consult  Post Acute Care Choice:  Home Health Choice offered to:  Spouse, Patient  DME Arranged:  N/A DME Agency:  NA  HH Arranged:  PT HH Agency:  [facility]  Status of Service:  Completed, signed off  If discussed at [place] of Stay Meetings, dates discussed:    Additional Comments:  Marilu Favre, RN 02/28/2018, 1:31 PM

## 2018-02-28 NOTE — Discharge Summary (Addendum)
Physician Discharge Summary  [name] EPP:[id number] DOB: 11/11/1922 DOA: 02/26/2018  PCP: [name], MD  Admit date: 02/26/2018 Discharge date: 02/28/2018  Admitted From home Disposition: Home Recommendations for Outpatient Follow-up:  1. Follow up with PCP in 1-2 weeks 2. Please obtain BMP/CBC in one week 3. Follow-up with nephrology  Home Health: None Equipment/Devices: None  Discharge Condition stable CODE STATUS: Full code Diet recommendation: Renal cardiac diet Brief/Interim Summary: 84 y.o. male with medical history significant of dementia, hypertension, hyperlipidemia, diabetes mellitus, CAD, CABG, CHF with EF of 30%, CKD-4, DVT, prostate cancer, pacemaker placement due to tachybradycardia syndrome, who presents with altered mental status.  Per pt's wife, patient has history of dementia, at baseline he knows place and her wife's name, most of the time he is interactive with family members, but normally not oriented to time. Pt is more confused since yesterday, less responsive, stopped talking, has decreased oral intake, not oriented x3.  Per his wife, patient does not seem to have any pain anywhere.  He moves all extremities.  He does not have chest pain, abdominal pain, no active cough, respiratory distress, nausea, vomiting, diarrhea noted.  Not sure if patient has any symptoms of UTI.  ED Course: pt was found to have WBC 3.6, troponin 0.04, worsening renal function, negative urinalysis, temperature normal, no tachycardia, oxygen saturation 100% on room air, negative chest x-ray, negative CT head for acute intracranial abnormalities.  Patient is placed on telemetry bed for observation.   Discharge Diagnoses:  Principal Problem:   Acute metabolic encephalopathy Active Problems:   Pacemaker   Hyperlipidemia   Dementia   Essential hypertension   Depression   CKD stage 4 due to type 2 diabetes mellitus (HCC)   CAD (coronary artery disease) of artery bypass graft   Type II diabetes mellitus with renal manifestations (HCC)   Chronic combined systolic (congestive) and diastolic (congestive) heart failure (HCC)   Elevated troponin  acute metabolic encephalopathy-patient was admitted with a diagnosis of change in mental status.  Patient has a history of dementia lives at home with his wife.  Work-up so far revealed no etiology for his change in mental status.  The only thing I can think of is patient is on multiple medications for dementia behaviors and depression.  That includes Seroquel Depakote Remeron and Zoloft.  I have discussed with his wife that I will decrease his Seroquel to half dose hold his Remeron and restart Seroquel only after few days once he is much more awake and back to baseline.  The only thing he received was IV hydration.  He was probably mildly dehydrated at the time of admission due to decreased p.o. intake and decreased appetite.  Discussed in detail with patient's wife and patient's daughter [name].  His daughter is a gastroenterologist.  There was no signs of any infection or focal neurologic findings.  CT head showed no acute intracranial abnormalities, urine analysis chest x-ray was negative.  He had elevated troponin at 0.04 in the setting of CKD stage IV.  He tends to run mildly elevated troponin.  She had no complaints of chest pain or shortness of breath and was on room air during his hospital stay.  Patient takes Zoloft, Seroquel, Depakote Remeron at home.  This polypharmacy is highly likely to increase his sedation and cause change in mental status.  Dementia with behaviors I have restarted his Seroquel and Depakote he takes at home.  When I saw him he was awake and able  to take food and pills through his mouth.  Hypertension restart home medications.  CKD stage IV stable.  History of CAD and CABG stable-patient has a chronically elevated troponin.  Type 2 diabetes restart home dose of insulin upon discharge.  Chronic  combined systolic and diastolic heart failure 2D echo in 2000 18 February showed ejection fraction 30 to 35%.  He remained stable during this hospital stay.  Patient takes Lasix as needed at home.    Discharge Instructions  Discharge Instructions    Call MD for:  difficulty breathing, headache or visual disturbances   Complete by:  As directed    Call MD for:  persistant dizziness or light-headedness   Complete by:  As directed    Call MD for:  persistant nausea and vomiting   Complete by:  As directed    Call MD for:  severe uncontrolled pain   Complete by:  As directed    Diet - low sodium heart healthy   Complete by:  As directed    Increase activity slowly   Complete by:  As directed      Allergies as of 02/28/2018      Reactions   Iodides Anaphylaxis, Other (See Comments)   Iodinated Diagnostic Agents Anaphylaxis   Iohexol Anaphylaxis    Desc: RN states pt stated he had anyphalactic shock reaction to contrast approx 10 years ago.   Other Shortness Of Breath   Ace Inhibitors Other (See Comments)   cough   Simvastatin Other (See Comments)   Muscle aches   Aspirin    Other reaction(s): Abdominal Pain      Medication List    STOP taking these medications   carbamide peroxide 6.5 % OTIC solution Commonly known as:  DEBROX   ziprasidone 20 MG capsule Commonly known as:  GEODON     TAKE these medications   acetaminophen 500 MG tablet Commonly known as:  TYLENOL Take 500 mg by mouth every 6 (six) hours as needed.   ALIGN PO Take 1 capsule by mouth daily.   amLODipine 10 MG tablet Commonly known as:  NORVASC Take 10 mg by mouth daily. What changed:  Another medication with the same name was removed. Continue taking this medication, and follow the directions you see here.   atenolol 50 MG tablet Commonly known as:  TENORMIN Take 50 mg by mouth 2 (two) times daily.   CALCIUM PO Take 600 mg by mouth 2 (two) times daily.   COQ10 PO Take 1 capsule by mouth  daily.   divalproex 125 MG capsule Commonly known as:  DEPAKOTE SPRINKLE Take 125 mg by mouth 2 (two) times daily.   ezetimibe 10 MG tablet Commonly known as:  ZETIA Take 1 tablet (10 mg total) by mouth daily.   feeding supplement (GLUCERNA SHAKE) Liqd Take 237 mLs by mouth 3 (three) times daily as needed (feeding supplement).   FREESTYLE LITE TEST VI USE AS DIRECTED   furosemide 20 MG tablet Commonly known as:  LASIX Take 1 tablet (20 mg total) by mouth daily as needed.   ICAPS PO Take 1 tablet by mouth 2 (two) times daily.   MULTIVITAMIN ADULT PO Take 1 capsule by mouth daily.   insulin glargine 100 UNIT/ML injection Commonly known as:  LANTUS Inject 6-18 Units into the skin at bedtime. Sliding scale   IRON PO Take 1 tablet by mouth daily.   isosorbide-hydrALAZINE 20-37.5 MG tablet Commonly known as:  BIDIL Take 1 tablet by mouth 3 (  three) times daily.   linagliptin 5 MG Tabs tablet Commonly known as:  TRADJENTA Take 7.5 mg by mouth daily.   mirtazapine 7.5 MG tablet Commonly known as:  REMERON Take 7.5 mg by mouth at bedtime.   polyethylene glycol packet Commonly known as:  MIRALAX / GLYCOLAX Take 17 g by mouth daily as needed for moderate constipation.   QUEtiapine 25 MG tablet Commonly known as:  SEROQUEL Take 25 mg by mouth every evening.   SENNA PO Take 1 tablet by mouth daily.   sertraline 25 MG tablet Commonly known as:  ZOLOFT Take 25 mg by mouth daily.      Follow-up Information    [name], MD Follow up.   Specialty:  Family Medicine Contact information: [address] [place] [address] [phone]        Belva Crome, MD .   Specialty:  Cardiology Contact information: [phone]  [place] [address] [phone]          Allergies  Allergen Reactions  . Iodides Anaphylaxis and Other (See Comments)  . Iodinated Diagnostic Agents Anaphylaxis  . Iohexol Anaphylaxis      Desc: RN states pt stated he had anyphalactic shock reaction to contrast approx 10 years ago.   . Other Shortness Of Breath  . Ace Inhibitors Other (See Comments)    cough  . Simvastatin Other (See Comments)    Muscle aches  . Aspirin     Other reaction(s): Abdominal Pain    Consultations: None  Procedures/Studies: Ct Head Wo Contrast  Result Date: 02/27/2018 CLINICAL DATA:  Altered level of consciousness. EXAM: CT HEAD WITHOUT CONTRAST TECHNIQUE: Contiguous axial images were obtained from the base of the skull through the vertex without intravenous contrast. COMPARISON:  Head CT 10/20/2017 FINDINGS: Brain: Unchanged degree of diffuse atrophy. Unchanged moderate to advanced chronic small vessel ischemia. No intracranial hemorrhage, mass effect, or midline shift. No hydrocephalus. The basilar cisterns are patent. No evidence of territorial infarct or acute ischemia. No extra-axial or intracranial fluid collection. Vascular: Atherosclerosis of skull base vasculature. No hyperdense vessel. Skull: No fracture or focal lesion. Sinuses/Orbits: Unchanged mucosal thickening of sphenoid sinuses. Unchanged minimal opacification of lower left mastoid air cells. Bilateral cataract resection. Other: None. IMPRESSION: 1.  No acute intracranial abnormality. 2. Unchanged atrophy and chronic small vessel ischemia. Electronically Signed   By: Jeb Levering M.D.   On: 02/27/2018 02:03   Dg Chest Port 1 View  Result Date: 02/27/2018 CLINICAL DATA:  Shortness of breath, evaluate for pulmonary edema EXAM: PORTABLE CHEST 1 VIEW COMPARISON:  09/15/2016 FINDINGS: Lungs are essentially clear. No frank interstitial edema. Mild bibasilar scarring/atelectasis. No pleural effusion or pneumothorax. The heart is mildly enlarged. Postsurgical changes related to prior CABG. Left subclavian pacemaker. Median sternotomy. IMPRESSION: No evidence of acute cardiopulmonary disease. No frank interstitial edema. Electronically Signed    By: Julian Hy M.D.   On: 02/27/2018 00:57    (Echo, Carotid, EGD, Colonoscopy, ERCP)    Subjective:   Discharge Exam: Vitals:   02/28/18 0645 02/28/18 0749  BP: (!) 132/49 (!) 155/73  Pulse: (!) 55 (!) 59  Resp: 16 16  Temp: 98.5 F (36.9 C)   SpO2: 97% 100%   Vitals:   02/27/18 1631 02/27/18 2310 02/28/18 0645 02/28/18 0749  BP: (!) 158/66 (!) 137/51 (!) 132/49 (!) 155/73  Pulse: 60 60 (!) 55 (!) 59  Resp: 16 16 16 16   Temp: 98 F (36.7  C) 98.4 F (36.9 C) 98.5 F (36.9 C)   TempSrc: Oral Oral Oral   SpO2: 100% 96% 97% 100%  Weight:  65.2 kg (143 lb 11.8 oz)    Height:        General: Pt is alert, awake, not in acute distress Cardiovascular: RRR, S1/S2 +, no rubs, no gallops Respiratory: CTA bilaterally, no wheezing, no rhonchi Abdominal: Soft, NT, ND, bowel sounds + Extremities: no edema, no cyanosis    The results of significant diagnostics from this hospitalization (including imaging, microbiology, ancillary and laboratory) are listed below for reference.     Microbiology: Recent Results (from the past 240 hour(s))  Urine culture     Status: None   Collection Time: 02/27/18  1:08 AM  Result Value Ref Range Status   Specimen Description URINE, CATHETERIZED  Final   Special Requests NONE  Final   Culture   Final    NO GROWTH Performed at [facility], [address] [place], [place] [address]    Report Status 02/28/2018 FINAL  Final     Labs: BNP (last 3 results) Recent Labs    02/27/18 0440  BNP 8,502.7*   Basic Metabolic Panel: Recent Labs  Lab 02/26/18 2330 02/27/18 0440 02/28/18 0544  NA 142 144 144  K 3.9 4.3 3.8  CL 110 108 112*  CO2 25 23 24   GLUCOSE 184* 168* 144*  BUN 57* 55* 51*  CREATININE 3.57* 3.31* 3.36*  CALCIUM 9.7 9.6 9.0   Liver Function Tests: Recent Labs  Lab 02/26/18 2330  AST 22  ALT 18  ALKPHOS 79  BILITOT 0.6  PROT 6.7  ALBUMIN 3.3*   No results for input(s): LIPASE, AMYLASE in the  last 168 hours. No results for input(s): AMMONIA in the last 168 hours. CBC: Recent Labs  Lab 02/26/18 2330 02/27/18 0440  WBC 3.6* 4.9  HGB 10.5* 10.6*  HCT 30.8* 31.8*  MCV 90.9 91.6  PLT 131* 129*   Cardiac Enzymes: Recent Labs  Lab 02/27/18 0025 02/27/18 0440 02/27/18 0919 02/27/18 2013  TROPONINI 0.04* 0.04* 0.04* 0.04*   BNP: Invalid input(s): POCBNP CBG: Recent Labs  Lab 02/27/18 0148 02/27/18 0735 02/27/18 1649 02/27/18 2310 02/28/18 0747  GLUCAP 162* 150* 169* 146* 144*   D-Dimer No results for input(s): DDIMER in the last 72 hours. Hgb A1c Recent Labs    02/27/18 0440  HGBA1C 7.3*   Lipid Profile Recent Labs    02/27/18 0440  CHOL 163  HDL 36*  LDLCALC 104*  TRIG 114  CHOLHDL 4.5   Thyroid function studies No results for input(s): TSH, T4TOTAL, T3FREE, THYROIDAB in the last 72 hours.  Invalid input(s): FREET3 Anemia work up No results for input(s): VITAMINB12, FOLATE, FERRITIN, TIBC, IRON, RETICCTPCT in the last 72 hours. Urinalysis    Component Value Date/Time   COLORURINE YELLOW 02/27/2018 0112   APPEARANCEUR HAZY (A) 02/27/2018 0112   LABSPEC 1.008 02/27/2018 0112   PHURINE 8.0 02/27/2018 0112   GLUCOSEU 50 (A) 02/27/2018 0112   HGBUR LARGE (A) 02/27/2018 0112   BILIRUBINUR NEGATIVE 02/27/2018 0112   BILIRUBINUR negative 03/22/2017 1243   KETONESUR NEGATIVE 02/27/2018 0112   PROTEINUR NEGATIVE 02/27/2018 0112   UROBILINOGEN 0.2 03/22/2017 1243   UROBILINOGEN 1.0 09/23/2009 0124   NITRITE NEGATIVE 02/27/2018 0112   LEUKOCYTESUR NEGATIVE 02/27/2018 0112   Sepsis Labs Invalid input(s): PROCALCITONIN,  WBC,  LACTICIDVEN Microbiology Recent Results (from the past 240 hour(s))  Urine culture  Status: None   Collection Time: 02/27/18  1:08 AM  Result Value Ref Range Status   Specimen Description URINE, CATHETERIZED  Final   Special Requests NONE  Final   Culture   Final    NO GROWTH Performed at [facility],  [address], [place], [place] [address]    Report Status 02/28/2018 FINAL  Final     Time coordinating discharge:  34 minutes  SIGNED:   Georgette Shell, MD  [facility] 02/28/2018, 11:58 AM Pager   If 7PM-7AM, please contact night-coverage [email or web address] Password [email or web address]

## 2018-02-28 NOTE — Evaluation (Signed)
Physical Therapy Evaluation Patient Details Name: [name] MRN: [id number] DOB: 1923/05/13 Today's Date: 02/28/2018   History of Present Illness  84 yo male with onset of AMS and SOB was admitted and hydrated, no source of AMS was found.  Continues to be lethargic.  PMHx:  CKD 4, CAD, elevated troponin, CHF, depression, HTN, pacemaker, dementia, CABG, EF 30%, DVT, prostate CA,   Clinical Impression  Pt was seen for evaluation of mobility but is quite lethargic today, more alert with movement but quickly falling to a somnolent state with sitting still and in bed.  He is with his wife and one of his regular caregivers, both of whom have a good history of his mobility.  Pt will be seen acutely if he is not discharging home, and HHPT is expected to follow for more mobility.  Pt's functional status seems more impacted by alertness than his actual strength but can certainly benefit from strengthening and work on his standing endurance.  Follow along acutely for same.  Pt was previously walking only short bed to BR trips, so anticipate that his current function is close to PLOF.    Follow Up Recommendations Home health PT;Supervision for mobility/OOB;Supervision/Assistance - 24 hour    Equipment Recommendations  None recommended by PT(has a walker at home)    Recommendations for Other Services       Precautions / Restrictions Precautions Precautions: Fall(telemetry) Restrictions Weight Bearing Restrictions: No Other Position/Activity Restrictions: requires assistance to walk on RW      Mobility  Bed Mobility Overal bed mobility: Needs Assistance Bed Mobility: Supine to Sit;Sit to Supine     Supine to sit: Min guard Sit to supine: Min guard   General bed mobility comments: pt is able to move on the bed including scooting up bed  Transfers Overall transfer level: Needs assistance Equipment used: Rolling walker (2 wheeled);1 person hand held assist Transfers: Sit to/from  Stand Sit to Stand: Min assist;From elevated surface         General transfer comment: pt is sleepy and not exerting with transfers then more able to support himself once more alert, but wants to keep eyes closed  Ambulation/Gait Ambulation/Gait assistance: Min guard;Min assist Gait Distance (Feet): 20 Feet Assistive device: Rolling walker (2 wheeled);1 person hand held assist Gait Pattern/deviations: Step-to pattern;Step-through pattern;Decreased stride length;Wide base of support;Trunk flexed;Drifts right/left Gait velocity: reduced Gait velocity interpretation: <1.31 ft/sec, indicative of household ambulator General Gait Details: pt is not attending to directions and follows with PT cuing walker direction.  PT is moving walker at times to direct only  Financial trader [employer] (Stroke Patients Only)       Balance Overall balance assessment: Needs assistance Sitting-balance support: Feet supported;Bilateral upper extremity supported Sitting balance-Leahy Scale: Poor Sitting balance - Comments: pt could sit with fair balance when awake Postural control: Right lateral lean Standing balance support: Bilateral upper extremity supported;During functional activity Standing balance-Leahy Scale: Poor                               Pertinent Vitals/Pain Pain Assessment: No/denies pain    Home Living Family/patient expects to be discharged to:: Private residence Living Arrangements: Spouse/significant other Available Help at Discharge: Family;Available PRN/intermittently;Personal care attendant;Available 24 hours/day Type of Home: House Home Access: Level entry     Home Layout: One level Home  Equipment: Gilford Rile - 2 wheels;Cane - single point Additional Comments: HOH, has need of caregivers since last year    Prior Function Level of Independence: Needs assistance   Gait / Transfers Assistance Needed: has been getting  assistance to walk bed to BR on RW  ADL's / Homemaking Assistance Needed: family assists dressing and bathing  Comments: Pt is a retired Careers adviser.     Journalist, newspaper        Extremity/Trunk Assessment   Upper Extremity Assessment Upper Extremity Assessment: Generalized weakness    Lower Extremity Assessment Lower Extremity Assessment: Generalized weakness    Cervical / Trunk Assessment Cervical / Trunk Assessment: Kyphotic  Communication   Communication: HOH(talk in R ear for best comprehension)  Cognition Arousal/Alertness: Lethargic Behavior During Therapy: Flat affect Overall Cognitive Status: History of cognitive impairments - at baseline                                 General Comments: history of dementia      General Comments General comments (skin integrity, edema, etc.): pt is asleep in bed when PT arrived, woke him up with difficulty and got him to sit up bedside.  pt got back to bed with mod I     Exercises     Assessment/Plan    PT Assessment Patient needs continued PT services  PT Problem List Decreased range of motion;Decreased activity tolerance;Decreased balance;Decreased mobility;Decreased strength;Decreased coordination;Decreased cognition;Decreased knowledge of use of DME;Decreased safety awareness       PT Treatment Interventions DME instruction;Gait training;Functional mobility training;Therapeutic activities;Therapeutic exercise;Balance training;Neuromuscular re-education;Patient/family education    PT Goals (Current goals can be found in the Care Plan section)  Acute Rehab PT Goals Patient Stated Goal: none stated PT Goal Formulation: With family Time For Goal Achievement: 03/14/18 Potential to Achieve Goals: Fair    Frequency Min 3X/week   Barriers to discharge Decreased caregiver support wife may need increased caregiver support for pt if lethargy continues    Co-evaluation               AM-PAC PT "6  Clicks" Daily Activity  Outcome Measure Difficulty turning over in bed (including adjusting bedclothes, sheets and blankets)?: A Little Difficulty moving from lying on back to sitting on the side of the bed? : A Lot Difficulty sitting down on and standing up from a chair with arms (e.g., wheelchair, bedside commode, etc,.)?: Unable Help needed moving to and from a bed to chair (including a wheelchair)?: A Little Help needed walking in hospital room?: A Little Help needed climbing 3-5 steps with a railing? : A Lot 6 Click Score: 14    End of Session Equipment Utilized During Treatment: Gait belt Activity Tolerance: Patient limited by lethargy Patient left: in bed;with call bell/phone within reach;with family/visitor present;with nursing/sitter in room Nurse Communication: Mobility status PT Visit Diagnosis: Unsteadiness on feet (R26.81);Other abnormalities of gait and mobility (R26.89);Difficulty in walking, not elsewhere classified (R26.2)    Time: 2035-5974 PT Time Calculation (min) (ACUTE ONLY): 23 min   Charges:   PT Evaluation $PT Eval Moderate Complexity: 1 Mod PT Treatments $Gait Training: 8-22 mins       Ramond Dial 02/28/2018, 2:26 PM   Mee Hives, PT MS Acute Rehab Dept. Number: [place] and [place]

## 2018-02-28 NOTE — Progress Notes (Signed)
PT Cancellation Note  Patient Details Name: [name] MRN: [id number] DOB: May 01, 1923   Cancelled Treatment:    Reason Eval/Treat Not Completed: Other (comment);Fatigue/lethargy limiting ability to participate.  Pt was tired and agreed with his caregiver who was present to return by the end of the morning to reattempt.   Ramond Dial 02/28/2018, 10:14 AM   Mee Hives, PT MS Acute Rehab Dept. Number: [place] and [place]

## 2018-03-17 ENCOUNTER — Telehealth: Payer: Self-pay | Admitting: Interventional Cardiology

## 2018-03-17 NOTE — Telephone Encounter (Signed)
New Message   Patients wife is calling to request a nurse. She states that the question is advise on what direction to take for the patient. She did not want to provide any additional information. Please call.

## 2018-03-17 NOTE — Telephone Encounter (Signed)
Spoke with Dr. Tamala Julian.  He will reach out to the family.

## 2018-03-17 NOTE — Telephone Encounter (Signed)
Spoke with wife and daughter, daughter on [place].  They state pt was d/c'ed from hospital on 7/30 and has had blood in his urine since then.  Pt in a program called [employer].  Doctor came out and examined pt.  Prescribed antibiotics which did not help and they did an ultrasound yesterday that did not show anything.  Daughter states per pt's labs he has lost about 1 unit of blood.  Wife and daughter wanted Dr. Thompson Caul opinion on what they should do next.  Advised we typically advise pt's to reach out to PCP or urologist.  Advised I will speak with Dr. Tamala Julian and call back with recommendations.

## 2018-05-11 ENCOUNTER — Other Ambulatory Visit: Payer: Self-pay

## 2018-05-11 ENCOUNTER — Encounter (HOSPITAL_COMMUNITY): Payer: Self-pay | Admitting: Emergency Medicine

## 2018-05-11 ENCOUNTER — Inpatient Hospital Stay (HOSPITAL_COMMUNITY)
Admission: EM | Admit: 2018-05-11 | Discharge: 2018-05-13 | DRG: 378 | Disposition: A | Discharge status: Home / Self Care | Payer: Medicare Other | Attending: Internal Medicine | Admitting: Internal Medicine

## 2018-05-11 DIAGNOSIS — E1165 Type 2 diabetes mellitus with hyperglycemia: Secondary | ICD-10-CM | POA: Diagnosis present

## 2018-05-11 DIAGNOSIS — I130 Hypertensive heart and chronic kidney disease with heart failure and stage 1 through stage 4 chronic kidney disease, or unspecified chronic kidney disease: Secondary | ICD-10-CM | POA: Diagnosis present

## 2018-05-11 DIAGNOSIS — K922 Gastrointestinal hemorrhage, unspecified: Secondary | ICD-10-CM

## 2018-05-11 DIAGNOSIS — F0151 Vascular dementia with behavioral disturbance: Secondary | ICD-10-CM | POA: Diagnosis not present

## 2018-05-11 DIAGNOSIS — F329 Major depressive disorder, single episode, unspecified: Secondary | ICD-10-CM | POA: Diagnosis present

## 2018-05-11 DIAGNOSIS — Z951 Presence of aortocoronary bypass graft: Secondary | ICD-10-CM

## 2018-05-11 DIAGNOSIS — E119 Type 2 diabetes mellitus without complications: Secondary | ICD-10-CM

## 2018-05-11 DIAGNOSIS — Z86718 Personal history of other venous thrombosis and embolism: Secondary | ICD-10-CM

## 2018-05-11 DIAGNOSIS — Z794 Long term (current) use of insulin: Secondary | ICD-10-CM

## 2018-05-11 DIAGNOSIS — I10 Essential (primary) hypertension: Secondary | ICD-10-CM

## 2018-05-11 DIAGNOSIS — Z8546 Personal history of malignant neoplasm of prostate: Secondary | ICD-10-CM

## 2018-05-11 DIAGNOSIS — F05 Delirium due to known physiological condition: Secondary | ICD-10-CM | POA: Diagnosis present

## 2018-05-11 DIAGNOSIS — H903 Sensorineural hearing loss, bilateral: Secondary | ICD-10-CM

## 2018-05-11 DIAGNOSIS — Z23 Encounter for immunization: Secondary | ICD-10-CM

## 2018-05-11 DIAGNOSIS — Z833 Family history of diabetes mellitus: Secondary | ICD-10-CM

## 2018-05-11 DIAGNOSIS — K5731 Diverticulosis of large intestine without perforation or abscess with bleeding: Secondary | ICD-10-CM | POA: Diagnosis not present

## 2018-05-11 DIAGNOSIS — H9190 Unspecified hearing loss, unspecified ear: Secondary | ICD-10-CM | POA: Diagnosis present

## 2018-05-11 DIAGNOSIS — N184 Chronic kidney disease, stage 4 (severe): Secondary | ICD-10-CM | POA: Diagnosis present

## 2018-05-11 DIAGNOSIS — Z888 Allergy status to other drugs, medicaments and biological substances status: Secondary | ICD-10-CM

## 2018-05-11 DIAGNOSIS — I495 Sick sinus syndrome: Secondary | ICD-10-CM | POA: Diagnosis present

## 2018-05-11 DIAGNOSIS — I2510 Atherosclerotic heart disease of native coronary artery without angina pectoris: Secondary | ICD-10-CM | POA: Diagnosis present

## 2018-05-11 DIAGNOSIS — Z66 Do not resuscitate: Secondary | ICD-10-CM | POA: Diagnosis present

## 2018-05-11 DIAGNOSIS — F0150 Vascular dementia without behavioral disturbance: Secondary | ICD-10-CM | POA: Diagnosis present

## 2018-05-11 DIAGNOSIS — K5909 Other constipation: Secondary | ICD-10-CM | POA: Diagnosis present

## 2018-05-11 DIAGNOSIS — Z91041 Radiographic dye allergy status: Secondary | ICD-10-CM

## 2018-05-11 DIAGNOSIS — Z886 Allergy status to analgesic agent status: Secondary | ICD-10-CM

## 2018-05-11 DIAGNOSIS — Z79899 Other long term (current) drug therapy: Secondary | ICD-10-CM

## 2018-05-11 DIAGNOSIS — E1122 Type 2 diabetes mellitus with diabetic chronic kidney disease: Secondary | ICD-10-CM | POA: Diagnosis present

## 2018-05-11 DIAGNOSIS — Z8249 Family history of ischemic heart disease and other diseases of the circulatory system: Secondary | ICD-10-CM

## 2018-05-11 DIAGNOSIS — D631 Anemia in chronic kidney disease: Secondary | ICD-10-CM | POA: Diagnosis present

## 2018-05-11 DIAGNOSIS — R358 Other polyuria: Secondary | ICD-10-CM | POA: Diagnosis present

## 2018-05-11 DIAGNOSIS — I5042 Chronic combined systolic (congestive) and diastolic (congestive) heart failure: Secondary | ICD-10-CM | POA: Diagnosis present

## 2018-05-11 DIAGNOSIS — K625 Hemorrhage of anus and rectum: Secondary | ICD-10-CM | POA: Diagnosis not present

## 2018-05-11 DIAGNOSIS — Z950 Presence of cardiac pacemaker: Secondary | ICD-10-CM

## 2018-05-11 HISTORY — DX: Gastrointestinal hemorrhage, unspecified: K92.2

## 2018-05-11 LAB — CBC
HCT: 26.8 % — ABNORMAL LOW (ref 39.0–52.0)
HEMATOCRIT: 29.1 % — AB (ref 39.0–52.0)
HEMOGLOBIN: 9.4 g/dL — AB (ref 13.0–17.0)
Hemoglobin: 8.9 g/dL — ABNORMAL LOW (ref 13.0–17.0)
MCH: 29.3 pg (ref 26.0–34.0)
MCH: 29.4 pg (ref 26.0–34.0)
MCHC: 32.3 g/dL (ref 30.0–36.0)
MCHC: 33.2 g/dL (ref 30.0–36.0)
MCV: 90.7 fL (ref 80.0–100.0)
MCV: 88.4 fL (ref 80.0–100.0)
NRBC: 0 % (ref 0.0–0.2)
PLATELETS: 166 10*3/uL (ref 150–400)
Platelets: 178 10*3/uL (ref 150–400)
RBC: 3.21 MIL/uL — ABNORMAL LOW (ref 4.22–5.81)
RBC: 3.03 MIL/uL — ABNORMAL LOW (ref 4.22–5.81)
RDW: 12.5 % (ref 11.5–15.5)
RDW: 12.4 % (ref 11.5–15.5)
WBC: 5.5 10*3/uL (ref 4.0–10.5)
WBC: 5.1 10*3/uL (ref 4.0–10.5)
nRBC: 0 % (ref 0.0–0.2)

## 2018-05-11 LAB — COMPREHENSIVE METABOLIC PANEL
ALT: 16 U/L (ref 0–44)
AST: 19 U/L (ref 15–41)
Albumin: 3.5 g/dL (ref 3.5–5.0)
Alkaline Phosphatase: 52 U/L (ref 38–126)
Anion gap: 11 (ref 5–15)
BILIRUBIN TOTAL: 0.8 mg/dL (ref 0.3–1.2)
BUN: 65 mg/dL — ABNORMAL HIGH (ref 8–23)
CHLORIDE: 110 mmol/L (ref 98–111)
CO2: 19 mmol/L — ABNORMAL LOW (ref 22–32)
Calcium: 10 mg/dL (ref 8.9–10.3)
Creatinine, Ser: 3.1 mg/dL — ABNORMAL HIGH (ref 0.61–1.24)
GFR, EST AFRICAN AMERICAN: 19 mL/min — AB (ref >60)
GFR, EST NON AFRICAN AMERICAN: 16 mL/min — AB (ref >60)
Glucose, Bld: 246 mg/dL — ABNORMAL HIGH (ref 70–99)
POTASSIUM: 4.6 mmol/L (ref 3.5–5.1)
Sodium: 140 mmol/L (ref 135–145)
TOTAL PROTEIN: 6.2 g/dL — AB (ref 6.5–8.1)

## 2018-05-11 LAB — POC OCCULT BLOOD, ED: Fecal Occult Bld: POSITIVE — AB

## 2018-05-11 LAB — GLUCOSE, CAPILLARY
GLUCOSE-CAPILLARY: 232 mg/dL — AB (ref 70–99)
GLUCOSE-CAPILLARY: 152 mg/dL — AB (ref 70–99)
Glucose-Capillary: 183 mg/dL — ABNORMAL HIGH (ref 70–99)

## 2018-05-11 LAB — TYPE AND SCREEN
ABO/RH(D): AB POS
ANTIBODY SCREEN: NEGATIVE

## 2018-05-11 MED ORDER — ACETAMINOPHEN 650 MG RE SUPP: 650.0000 mg | Freq: Four times a day (QID) | RECTAL | Status: DC | PRN

## 2018-05-11 MED ORDER — PSYLLIUM 95 % PO PACK
1.0000 | PACK | Freq: Every day | ORAL | Status: DC
  Filled 2018-05-11 (×2): qty 1

## 2018-05-11 MED ORDER — INFLUENZA VAC SPLIT HIGH-DOSE 0.5 ML IM SUSY
0.5000 mL | PREFILLED_SYRINGE | INTRAMUSCULAR | Status: AC
  Administered 2018-05-13: 0.5 mL via INTRAMUSCULAR
  Filled 2018-05-11: qty 0.5

## 2018-05-11 MED ORDER — ATENOLOL 50 MG PO TABS
50.0000 mg | ORAL_TABLET | Freq: Two times a day (BID) | ORAL | Status: DC
  Administered 2018-05-12 – 2018-05-13 (×3): 50 mg via ORAL
  Filled 2018-05-11 (×3): qty 1

## 2018-05-11 MED ORDER — SODIUM CHLORIDE 0.9 % IV BOLUS
500.0000 mL | Freq: Once | INTRAVENOUS | Status: AC
  Administered 2018-05-11: 500 mL via INTRAVENOUS

## 2018-05-11 MED ORDER — DIVALPROEX SODIUM 125 MG PO DR TAB
125.0000 mg | DELAYED_RELEASE_TABLET | Freq: Two times a day (BID) | ORAL | Status: DC
  Administered 2018-05-11: 125 mg via ORAL
  Filled 2018-05-11: qty 1

## 2018-05-11 MED ORDER — SODIUM CHLORIDE 0.9% FLUSH
3.0000 mL | Freq: Two times a day (BID) | INTRAVENOUS | Status: DC
  Administered 2018-05-11 – 2018-05-13 (×3): 3 mL via INTRAVENOUS

## 2018-05-11 MED ORDER — QUETIAPINE FUMARATE 50 MG PO TABS
50.0000 mg | ORAL_TABLET | Freq: Every day | ORAL | Status: DC
  Administered 2018-05-11: 50 mg via ORAL
  Filled 2018-05-11: qty 1

## 2018-05-11 MED ORDER — INSULIN ASPART 100 UNIT/ML ~~LOC~~ SOLN: 0.0000 [IU] | Freq: Every day | SUBCUTANEOUS | Status: DC

## 2018-05-11 MED ORDER — DIVALPROEX SODIUM 125 MG PO CSDR
125.0000 mg | DELAYED_RELEASE_CAPSULE | Freq: Every day | ORAL | Status: DC
  Administered 2018-05-12 – 2018-05-13 (×2): 125 mg via ORAL
  Filled 2018-05-11 (×3): qty 1

## 2018-05-11 MED ORDER — DOCUSATE SODIUM 100 MG PO CAPS
200.0000 mg | ORAL_CAPSULE | Freq: Two times a day (BID) | ORAL | Status: DC
  Administered 2018-05-11 – 2018-05-12 (×2): 200 mg via ORAL
  Administered 2018-05-12: 100 mg via ORAL
  Administered 2018-05-13: 200 mg via ORAL
  Filled 2018-05-11 (×4): qty 2

## 2018-05-11 MED ORDER — SERTRALINE HCL 25 MG PO TABS
25.0000 mg | ORAL_TABLET | Freq: Every day | ORAL | Status: DC
  Filled 2018-05-11: qty 1

## 2018-05-11 MED ORDER — ACETAMINOPHEN 325 MG PO TABS
650.0000 mg | ORAL_TABLET | Freq: Four times a day (QID) | ORAL | Status: DC | PRN
  Administered 2018-05-11: 650 mg via ORAL
  Filled 2018-05-11: qty 2

## 2018-05-11 MED ORDER — QUETIAPINE 12.5 MG HALF TABLET
12.5000 mg | ORAL_TABLET | Freq: Once | ORAL | Status: AC
  Administered 2018-05-11: 12.5 mg via ORAL
  Filled 2018-05-11: qty 1

## 2018-05-11 MED ORDER — SERTRALINE HCL 25 MG PO TABS
12.5000 mg | ORAL_TABLET | Freq: Every day | ORAL | Status: DC
  Administered 2018-05-12 – 2018-05-13 (×2): 12.5 mg via ORAL
  Filled 2018-05-11 (×2): qty 0.5

## 2018-05-11 MED ORDER — DOCUSATE SODIUM 100 MG PO CAPS: 100.0000 mg | ORAL_CAPSULE | Freq: Two times a day (BID) | ORAL | Status: DC

## 2018-05-11 MED ORDER — AMLODIPINE BESYLATE 10 MG PO TABS
10.0000 mg | ORAL_TABLET | Freq: Every day | ORAL | Status: DC
  Administered 2018-05-12 – 2018-05-13 (×2): 10 mg via ORAL
  Filled 2018-05-11 (×2): qty 1

## 2018-05-11 MED ORDER — ISOSORB DINITRATE-HYDRALAZINE 20-37.5 MG PO TABS
1.0000 | ORAL_TABLET | Freq: Three times a day (TID) | ORAL | Status: DC
  Administered 2018-05-11 – 2018-05-13 (×6): 1 via ORAL
  Filled 2018-05-11 (×6): qty 1

## 2018-05-11 MED ORDER — INSULIN ASPART 100 UNIT/ML ~~LOC~~ SOLN
0.0000 [IU] | Freq: Three times a day (TID) | SUBCUTANEOUS | Status: DC
  Administered 2018-05-11: 3 [IU] via SUBCUTANEOUS
  Administered 2018-05-12: 2 [IU] via SUBCUTANEOUS
  Administered 2018-05-12: 3 [IU] via SUBCUTANEOUS
  Administered 2018-05-13: 5 [IU] via SUBCUTANEOUS

## 2018-05-11 NOTE — ED Triage Notes (Signed)
Per EMS- pt from home. Wife woke up and noted he had bloody stool all over himself. Pt has dementia. Pt is not on blood thinners. Pt is at his baseline per wife, pt has intermittent speech.

## 2018-05-11 NOTE — Progress Notes (Signed)
Medicine attending admission note: I personally examined this patient and reviewed all pertinent clinical and laboratory data and I attest to the accuracy of the evaluation and management plan as recorded in the history and physical exam by resident physician Dr. Lorella Nimrod.  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  Previously on anticoagulation but currently.  Type 2 diabetes on variable dose of Lantus insulin and Tradjenta.   He has developed dementia.  According to his caregiver he is currently living a bed to bathroom existence.  He can ambulate short distances with a walker.  He communicates verbally with his wife and family but not much with outsiders. He was admitted after a fall in July 2017 and suffered a focal intraparenchymal hemorrhage in the left temporal lobe.  He was on warfarin at the time which was immediately reversed.  All anticoagulation stopped at that point.  He was admitted here on March 20 with colitis.  CT scan showed colonic diverticulosis without diverticulitis at that time.  Most recent admission July 28 through July 30 to evaluate a change in his baseline mental status.  CT brain with no acute abnormalities.  Negative chest x-ray and urine analysis.  His p.o. intake had been declining and he was felt to be dehydrated.  Contributions to change in mental status due to polypharmacy.  Medications adjusted.    He presents at the current time with acute onset of a self-limited episode of gross hematochezia.  Initial blood pressure 155/102, pulse 88, respirations 16, oxygen saturation 99% on room air, temperature 98.4. Hemoglobin 9.4 with MCV 91.  Baseline hemoglobin 10.6 recorded on July 29, due to known chronic renal insufficiency BUN 65,  creatinine 3.1, random glucose 246, bicarbonate 19, anion gap 11  Current exam:  Blood pressure (!) 150/65, pulse 76, temperature 98.4 F (36.9 C), temperature source Oral, resp. rate 16, SpO2 100 %.  Elderly man who is hard of hearing and minimally interactive verbally, he does not follow commands. Lungs overall clear with poor inspiratory effort.  Regular cardiac rhythm.  No murmur.  Pacemaker subcutaneous tissues left pectoral area. The abdomen is soft and nontender, no palpable mass or organomegaly. Grossly bloody stool on rectal exam in the ED. Neurologic: He cannot cooperate fully with the exam.  He resists my opening his eyes so pupillary responses cannot be recorded.  He withdraws all extremities to minor stimulation.  Reflexes 2+ symmetric at the knees, 1+ symmetric at the biceps.  Impression: Painless hematochezia. Documented diverticulosis. Suspect diverticular bleed.  He was seen in consultation by gastroenterology.  Conservative treatment recommended at present.  He is hemodynamically stable.  Minimum fall in his hemoglobin from his recent baseline.  We will monitor his status overnight.  Transfuse as needed if hemoglobin falls. Monitor blood pressure and pulse Monitor glucose and use sliding scale regular insulin.  Both of his children are physicians and one is a gastroenterologist.  They live out of state but are in route to [place].

## 2018-05-11 NOTE — Consult Note (Addendum)
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 Cristina Gong, presented to the ER after his wife called medical alert due to 1 episode of rectal bleeding described as less than half a cupful of bright red blood along with feces. This was not associated with nausea, vomiting, abdominal pain or rectal pain. Rectal examination by the ED physician did not show any external hemorrhoids but gross blood was noted on gloved finger. Patient normally has a bowel movement once a day or once in 2 days with the use of MiraLAX on a regular basis.  Patient had his last colonoscopy in 12/2010 with removal of tubular adenomas from ascending colon and sigmoid, tattoo in rectal area- prior polypectomy from rectum. Surveillance flexible sigmoidoscopy was not recommended thereafter due to his advanced age and co-morbidities-he has vascular dementia,tachybradycardia syndrome requiring placement of a pacemaker and CABG 2 in the past.  He has history of prostate cancer requiring surgery as well as radiation more than 20 years ago. He used to be on Coumadin however is currently not on any blood thinners or antiplatelet. He was admitted in 3/19 with nausea and vomiting and CT had shown multifocal diverticulosis and large stool burden with fecal impaction.   Past Medical History:  Diagnosis Date  . CAD (coronary artery disease)    a. CABG 1991 and 2006 LIMA LAD, seq. SVG D1-Cfx, SVG d2,and seq. SVG AM-PDA   . Chronic systolic CHF (congestive heart failure) ([place])   . CKD (chronic kidney disease)   . Dementia ([place])   . Depression   . Diabetes mellitus   . HTN (hypertension)   . Left leg DVT ([place])   . Presence of permanent cardiac pacemaker   . Prostate cancer ([place])   .  Tachycardia-bradycardia syndrome [facility])     Past Surgical History:  Procedure Laterality Date  . CORONARY ARTERY BYPASS GRAFT    . PROSTATECTOMY      Prior to Admission medications   Medication Sig Start Date End Date Taking? Authorizing Provider  acetaminophen (TYLENOL) 500 MG tablet Take 500 mg by mouth every 6 (six) hours as needed.   Yes [provider]  amLODipine (NORVASC) 10 MG tablet Take 10 mg by mouth daily. 01/22/18  Yes [provider]  atenolol (TENORMIN) 50 MG tablet Take 50 mg by mouth 2 (two) times daily.    Yes [provider]  CALCIUM PO Take 600 mg by mouth 2 (two) times daily.    Yes [provider]  Coenzyme Q10 (COQ10 PO) Take 1 capsule by mouth daily.   Yes [provider]  divalproex (DEPAKOTE SPRINKLE) 125 MG capsule Take 125 mg by mouth 2 (two) times daily. 02/21/18  Yes [provider]  feeding supplement, GLUCERNA SHAKE, (GLUCERNA SHAKE) LIQD Take 237 mLs by mouth 3 (three) times daily as needed (feeding supplement).   Yes [provider]  furosemide (LASIX) 20 MG tablet Take 1 tablet (20 mg total) by mouth daily as needed. 10/22/17  Yes Florencia Reasons, MD  insulin glargine (LANTUS) 100 UNIT/ML injection Inject 6-18 Units into the skin as needed. Sliding scale    Yes [provider]  IRON PO Take 1 tablet by mouth daily.   Yes [provider]  isosorbide-hydrALAZINE (BIDIL) 20-37.5 MG tablet Take 1 tablet by mouth  3 (three) times daily. 11/15/15  Yes Rivet, Sindy Guadeloupe, MD  linagliptin (TRADJENTA) 5 MG TABS tablet Take 7.5 mg by mouth daily.   Yes [provider]  Multiple Vitamins-Minerals (ICAPS PO) Take 1 tablet by mouth 2 (two) times daily.   Yes [provider]  Multiple Vitamins-Minerals (MULTIVITAMIN ADULT PO) Take 1 capsule by mouth daily.   Yes [provider]  polyethylene glycol (MIRALAX / GLYCOLAX) packet Take 17 g by mouth daily as needed for moderate  constipation.   Yes [provider]  Probiotic Product (ALIGN PO) Take 1 capsule by mouth daily.   Yes [provider]  QUEtiapine (SEROQUEL) 25 MG tablet Take 0.5 tablets (12.5 mg total) by mouth at bedtime. Patient taking differently: Take 50 mg by mouth at bedtime.  02/28/18  Yes Georgette Shell, MD  SENNA PO Take 8.6 mg by mouth daily.    Yes [provider]  sertraline (ZOLOFT) 25 MG tablet Take 25 mg by mouth daily.    Yes [provider]  ezetimibe (ZETIA) 10 MG tablet Take 1 tablet (10 mg total) by mouth daily. Patient not taking: Reported on 05/11/2018 10/22/17   Florencia Reasons, MD  Glucose Blood (FREESTYLE LITE TEST VI) USE AS DIRECTED 06/02/13   [provider]    Current Facility-Administered Medications  Medication Dose Route Frequency Provider Last Rate Last Dose  . divalproex (DEPAKOTE) DR tablet 125 mg  125 mg Oral Q12H Drenda Freeze, MD   125 mg at 05/11/18 1037   Current Outpatient Medications  Medication Sig Dispense Refill  . acetaminophen (TYLENOL) 500 MG tablet Take 500 mg by mouth every 6 (six) hours as needed.    Marland Kitchen amLODipine (NORVASC) 10 MG tablet Take 10 mg by mouth daily.  3  . atenolol (TENORMIN) 50 MG tablet Take 50 mg by mouth 2 (two) times daily.     Marland Kitchen CALCIUM PO Take 600 mg by mouth 2 (two) times daily.     . Coenzyme Q10 (COQ10 PO) Take 1 capsule by mouth daily.    . divalproex (DEPAKOTE SPRINKLE) 125 MG capsule Take 125 mg by mouth 2 (two) times daily.  10  . feeding supplement, GLUCERNA SHAKE, (GLUCERNA SHAKE) LIQD Take 237 mLs by mouth 3 (three) times daily as needed (feeding supplement).    . furosemide (LASIX) 20 MG tablet Take 1 tablet (20 mg total) by mouth daily as needed. 30 tablet 0  . insulin glargine (LANTUS) 100 UNIT/ML injection Inject 6-18 Units into the skin as needed. Sliding scale     . IRON PO Take 1 tablet by mouth daily.    . isosorbide-hydrALAZINE (BIDIL) 20-37.5 MG tablet Take 1 tablet by  mouth 3 (three) times daily. 90 tablet 1  . linagliptin (TRADJENTA) 5 MG TABS tablet Take 7.5 mg by mouth daily.    . Multiple Vitamins-Minerals (ICAPS PO) Take 1 tablet by mouth 2 (two) times daily.    . Multiple Vitamins-Minerals (MULTIVITAMIN ADULT PO) Take 1 capsule by mouth daily.    . polyethylene glycol (MIRALAX / GLYCOLAX) packet Take 17 g by mouth daily as needed for moderate constipation.    . Probiotic Product (ALIGN PO) Take 1 capsule by mouth daily.    . QUEtiapine (SEROQUEL) 25 MG tablet Take 0.5 tablets (12.5 mg total) by mouth at bedtime. (Patient taking differently: Take 50 mg by mouth at bedtime. ) 30 tablet 0  . SENNA PO Take 8.6 mg by mouth daily.     Marland Kitchen  sertraline (ZOLOFT) 25 MG tablet Take 25 mg by mouth daily.     Marland Kitchen ezetimibe (ZETIA) 10 MG tablet Take 1 tablet (10 mg total) by mouth daily. (Patient not taking: Reported on 05/11/2018) 30 tablet 0  . Glucose Blood (FREESTYLE LITE TEST VI) USE AS DIRECTED      Allergies as of 05/11/2018 - Review Complete 05/11/2018  Allergen Reaction Noted  . Iodides Anaphylaxis and Other (See Comments) 10/07/2011  . Iodinated diagnostic agents Anaphylaxis 06/06/2014  . Iohexol Anaphylaxis 09/23/2009  . Other Shortness Of Breath 11/09/2014  . Ace inhibitors Other (See Comments) 06/06/2014  . Simvastatin Other (See Comments) 06/06/2014  . Aspirin  04/30/2015    Family History  Problem Relation Age of Onset  . Diabetes Mother   . Stroke Mother   . Heart disease Sister   . Heart disease Sister   . Heart disease Sister   . Heart disease Sister     Social History   Socioeconomic History  . Marital status: Married    Spouse name: Not on file  . Number of children: Not on file  . Years of education: Not on file  . Highest education level: Not on file  Occupational History  . Not on file  Social Needs  . Financial resource strain: Not on file  . Food insecurity:    Worry: Not on file    Inability: Not on file  .  Transportation needs:    Medical: Not on file    Non-medical: Not on file  Tobacco Use  . Smoking status: Never Smoker  . Smokeless tobacco: Never Used  Substance and Sexual Activity  . Alcohol use: No    Alcohol/week: 0.0 standard drinks  . Drug use: No  . Sexual activity: Not Currently  Lifestyle  . Physical activity:    Days per week: Not on file    Minutes per session: Not on file  . Stress: Not on file  Relationships  . Social connections:    Talks on phone: Not on file    Gets together: Not on file    Attends religious service: Not on file    Active member of club or organization: Not on file    Attends meetings of clubs or organizations: Not on file    Relationship status: Not on file  . Intimate partner violence:    Fear of current or ex partner: Not on file    Emotionally abused: Not on file    Physically abused: Not on file    Forced sexual activity: Not on file  Other Topics Concern  . Not on file  Social History Narrative  . Not on file    Review of Systems: Positive for: GI: Described in detail in HPI.    Gen: Denies any fever, chills, rigors, night sweats, anorexia, fatigue, weakness, malaise, involuntary weight loss, and sleep disorder CV: Denies chest pain, angina, palpitations, syncope, orthopnea, PND, peripheral edema, and claudication. Resp: Denies dyspnea, cough, sputum, wheezing, coughing up blood. GU : Denies urinary burning, blood in urine, urinary frequency, urinary hesitancy, nocturnal urination, and urinary incontinence. MS: Denies joint pain or swelling.  Denies muscle weakness, cramps, atrophy.  Derm: Denies rash, itching, oral ulcerations, hives, unhealing ulcers.  Psych: memory loss,Denies depression, anxiety,  suicidal ideation, hallucinations. Heme: Denies bruising, and enlarged lymph nodes. Neuro:   Rectal bleeding,Denies any headaches, dizziness, paresthesias. Endo:   DM, Denies any problems with thyroid, adrenal function.  Physical  Exam: Vital signs in last  24 hours: Pulse Rate:  [76-88] 76 (10/10 1000) Resp:  [16] 16 (10/10 1000) BP: (155-157)/(69-102) 157/69 (10/10 1000) SpO2:  [99 %-100 %] 100 % (10/10 1000)    General:   Alert,  Thinly built, frail, elderly, pleasant and cooperative in NAD Head:  Normocephalic and atraumatic. Eyes:  Sclera clear, no icterus.  Mild pallor Ears:  Heard of hearing Nose:  No deformity, discharge,  or lesions. Mouth:  No deformity or lesions.  Oropharynx pink & moist. Neck:  Supple; no masses or thyromegaly. Lungs:  Clear throughout to auscultation.   No wheezes, crackles, or rhonchi. No acute distress. Heart:  Permanent pacemaker in place, Regular rate and rhythm. Extremities:  Without clubbing or edema. Neurologic:  Alert and  oriented x2;  Moves all 4 extremities. Skin:  Intact without significant lesions or rashes. Psych:  Alert and cooperative. Normal mood and affect. Abdomen:  Soft, nontender and nondistended. No masses, hepatosplenomegaly or hernias noted. Normal bowel sounds, without guarding, and without rebound.         Lab Results: Recent Labs    05/11/18 0926  WBC 5.5  HGB 9.4*  HCT 29.1*  PLT 178   BMET Recent Labs    05/11/18 0926  NA 140  K 4.6  CL 110  CO2 19*  GLUCOSE 246*  BUN 65*  CREATININE 3.10*  CALCIUM 10.0   LFT Recent Labs    05/11/18 0926  PROT 6.2*  ALBUMIN 3.5  AST 19  ALT 16  ALKPHOS 52  BILITOT 0.8   PT/INR No results for input(s): LABPROT, INR in the last 72 hours.  Studies/Results: No results found.  Impression: 1.Painless hematochezia,hemoglobin was 10.6 on 02/27/18 and is 9.4 today on admission Hemodynamically stable  2.chronic kidney disease, GFR 19  3.CABG, permanent pacemaker in place  4. Vascular dementia   Plan: Etiology could be diverticular bleed as CAT scan from 3/19 showed multifocal colonic diverticulosis. Other differential diagnoses to consider include radiation proctitis, stercoral ulcers  from constipation, bleeding from internal hemorrhoids. Patient has been started on soft diet, will add stool softeners and Metamucil. I spoke with patient's wife at bedside and recommended conservative management with monitoring of H&H and transfusing as needed. If patient has continued bleeding/further drop in hemoglobin, we can consider a bleeding scan for possible embolization if needed. Due to his advanced age and multiple co-morbidities, colonoscopy/sigmoidoscopy should be avoided. The risks and benefits of the procedures and options were discussed with the patient's wife, she understands and verbalizes consent and agrees with expectant management.   LOS: 0 days   Ronnette Juniper, M.D.  05/11/2018, 11:23 AM  Pager [phone] If no answer or after 5 PM call [phone]

## 2018-05-11 NOTE — Progress Notes (Addendum)
Telephone Call 8:30 PM Spoke with patient's daughter, Dr. Andrez Grime, on her mobile phone [phone]). She expressed concern about patient's current status and description of his prior hospitalization. She states when he had BUN over 60 in prior hospitalization, he was found to be significantly dehydrated and required gentle fluid resuscitation. She also describes a prolonged hospitalization in the past due to complications with delirium with combative behavior. She states that his neuropsychiatrist had recommended a regimen of Seroquel and Geodone PRN in prior hospitalizations to prevent such disruptive behavior and this regimen had worked well in the past. She also mentions that he has been kept mostly indoors for the last couple years and would like to request Vitamin D be added on to his morning labs to check for deficiency.  Subjective: Examined and evaluated Mr.Soule at bedside. He was sleeping comfortably with caretaker present. Upon awakening, he was pleasant and able to follow direction although very hard of hearing. AAO x1 to self. Denies any abdominal pain.  Mr.Cherian is unable to clarify if he has had any bloody bowel movement since admission and his caretaker states she is unsure.  Objective:  Physical Exam: Vitals:   05/11/18 1218 05/11/18 1337  BP:  (!) 150/65  Pulse: 73 76  Resp: (!) 24 16  Temp:  98.4 F (36.9 C)  SpO2: 100% 100%  Gen: Well-developed, well nourished, NAD HEENT: Very poor hearing, EOMI, PERRL,moist mucous membranes Neck: supple, ROM intact, no JVD, no cervical adenopathy CV: RRR, S1, S2 normal, No rubs, no murmurs, no gallops Pulm: CTAB, No rales, no wheezes, no rhonchi Abd: Soft, BS+, NTND, No rebound, no guarding Extm: Peripheral pulses intact, No peripheral edema Skin: Dry, Warm, normal turgor Neuro: AAOx1  A/P: Discussed case with senior resident.  - Chart review shows patient tolerated Geodone and Seroquel well on prior admission but patient  appears stable and comfortable at this moment.  - Will observe overnight on home scheduled antipsychotics and attempt non-pharmaceutical options prior to adding PRN antipsychotics for agitation. - He had 500cc NS bolus after his morning BMP was drawn. Does not appear dry on exam. Will hold off on starting fluids and f/u with morning labs.  - Serum calcium WNL at 10. Will hold off on checking vitamin D at this time.   Update: Paged at 2AM about patient waking up and pulling at line and telemetry and attempting to get out of bed. Re-examined patient at bedside with private caretaker/sitter and RN present. Was observed lying naked with gown off in bed. Observed attempting to get out of bed with telemtery leads off and appears agitated. Tried to re-direct but non-verbal and unable to follow directions. Conversation with sitter revealed she attempted to re-direct as well without success. Ordered PRN antipsychotics as instructed by daughter.  Gilberto Better, MD, PGY1 05/12/2018 2:00AM Pager: [phone]

## 2018-05-11 NOTE — H&P (Signed)
Date: 05/11/2018               Patient Name:  [name] MRN: [id number]  DOB: 03-28-1923 Age / Sex: 84 y.o., male   PCP: [name], MD         Medical Service: Internal Medicine Teaching Service         Attending Physician: Dr. Annia Belt, MD    First Contact: Dr. Annie Paras Pager: [phone]  Second Contact: Dr. Heber [place] Pager: [phone]       After Hours (After 5p/  First Contact Pager: [phone]  weekends / holidays): Second Contact Pager: [phone]   Chief Complaint: Bleeding per rectum.  History of Present Illness: [name] is a 84 y.o. male with PMHx of CAD, CKD, dementia, DM, HTN, HFrEF,  morbitz type II heart block s/p dual-chamber pacemaker and prostate cancer brought to ED after one episode of bright red bleeding per rectum.  Patient was in his usual state of health, dependent on his wife for all his ADLs, called his wife earlier this morning stating that he had an accident, when wife got up she saw blood mixed with fecal material on the floor and in the toilet bowl.  Patient denies any abdominal pain, nausea, vomiting or diarrhea.  He never had any similar symptoms. Denies any chest pain or shortness of breath. Denies any dizziness or blurry vision. Denies any urinary symptoms. Denies any fever or chills. No recent illness.  ED course.  His vitals were stable, hemoglobin of 9.4 with baseline around 10-11.  GI will manage him conservatively, admitted for observation.  Meds:  Current Meds  Medication Sig  . acetaminophen (TYLENOL) 500 MG tablet Take 500 mg by mouth every 6 (six) hours as needed.  Marland Kitchen amLODipine (NORVASC) 10 MG tablet Take 10 mg by mouth daily.  Marland Kitchen atenolol (TENORMIN) 50 MG tablet Take 50 mg by mouth 2 (two) times daily.   Marland Kitchen CALCIUM PO Take 600 mg by mouth 2 (two) times daily.   . Coenzyme Q10 (COQ10 PO) Take 1 capsule by mouth daily.  . divalproex (DEPAKOTE SPRINKLE) 125 MG capsule Take 125 mg by mouth 2 (two) times daily.  .  feeding supplement, GLUCERNA SHAKE, (GLUCERNA SHAKE) LIQD Take 237 mLs by mouth 3 (three) times daily as needed (feeding supplement).  . furosemide (LASIX) 20 MG tablet Take 1 tablet (20 mg total) by mouth daily as needed.  . insulin glargine (LANTUS) 100 UNIT/ML injection Inject 6-18 Units into the skin as needed. Sliding scale   . IRON PO Take 1 tablet by mouth daily.  . isosorbide-hydrALAZINE (BIDIL) 20-37.5 MG tablet Take 1 tablet by mouth 3 (three) times daily.  Marland Kitchen linagliptin (TRADJENTA) 5 MG TABS tablet Take 7.5 mg by mouth daily.  . Multiple Vitamins-Minerals (ICAPS PO) Take 1 tablet by mouth 2 (two) times daily.  . Multiple Vitamins-Minerals (MULTIVITAMIN ADULT PO) Take 1 capsule by mouth daily.  . polyethylene glycol (MIRALAX / GLYCOLAX) packet Take 17 g by mouth daily as needed for moderate constipation.  . Probiotic Product (ALIGN PO) Take 1 capsule by mouth daily.  . QUEtiapine (SEROQUEL) 25 MG tablet Take 0.5 tablets (12.5 mg total) by mouth at bedtime. (Patient taking differently: Take 50 mg by mouth at bedtime. )  . SENNA PO Take 8.6 mg by mouth daily.   . sertraline (ZOLOFT) 25 MG tablet Take 25 mg by mouth daily.      Allergies: Allergies as of 05/11/2018 - Review  Complete 05/11/2018  Allergen Reaction Noted  . Iodides Anaphylaxis and Other (See Comments) 10/07/2011  . Iodinated diagnostic agents Anaphylaxis 06/06/2014  . Iohexol Anaphylaxis 09/23/2009  . Other Shortness Of Breath 11/09/2014  . Ace inhibitors Other (See Comments) 06/06/2014  . Simvastatin Other (See Comments) 06/06/2014  . Aspirin  04/30/2015   Past Medical History:  Diagnosis Date  . CAD (coronary artery disease)    a. CABG 1991 and 2006 LIMA LAD, seq. SVG D1-Cfx, SVG d2,and seq. SVG AM-PDA   . Chronic systolic CHF (congestive heart failure) ([place])   . CKD (chronic kidney disease)   . Dementia ([place])   . Depression   . Diabetes mellitus   . HTN (hypertension)   . Left leg DVT ([place])   . Presence  of permanent cardiac pacemaker   . Prostate cancer ([place])   . Tachycardia-bradycardia syndrome ([place])     Family History: Mother had diabetes and died of stroke, sister with heart disease.  Social History: Married lives with his wife, who is the primary caretaker. Never smoked, denies any alcohol or illicit drug use.  Review of Systems: A complete ROS was negative except as per HPI.   Physical Exam: Blood pressure (!) 150/65, pulse 76, temperature 98.4 F (36.9 C), temperature source Oral, resp. rate 16, SpO2 100 %. Vitals:   05/11/18 0849 05/11/18 1000 05/11/18 1218 05/11/18 1337  BP: (!) 155/102 (!) 157/69  (!) 150/65  Pulse: 88 76 73 76  Resp: 16 16 (!) 24 16  Temp:    98.4 F (36.9 C)  TempSrc:    Oral  SpO2: 99% 100% 100% 100%   General: Vital signs reviewed.  Patient is well-developed and well-nourished, oriented only to self due to advanced dementia, hard hearing, in no acute distress and cooperative with exam.  Head: Normocephalic and atraumatic. Eyes: EOMI, conjunctivae normal, no scleral icterus.  Neck: Supple, trachea midline, normal ROM, no JVD, masses, thyromegaly, or carotid bruit present.  Cardiovascular: RRR, S1 normal, S2 normal, no murmurs, gallops, or rubs. Pulmonary/Chest: Clear to auscultation bilaterally, no wheezes, rales, or rhonchi. Abdominal: Soft, non-tender, non-distended, BS +, no masses, organomegaly, or guarding present.  Extremities: No lower extremity edema bilaterally,  pulses symmetric and intact bilaterally. No cyanosis or clubbing. Neurological: A&O x1, Strength is normal and symmetric bilaterally, cranial nerve II-XII are grossly intact, no focal motor deficit, sensory intact to light touch bilaterally.  Skin: Warm, dry and intact.  Multiple hyperpigmented macules on plantar surfaces bilaterally.  EKG: personally reviewed my interpretation is dual paced rhythm.   Assessment & Plan by Problem: [name] is a 84 y.o. male with PMHx of  CAD, CKD, dementia, DM, HTN,  morbidities type II heart block s/p dual-chamber pacemaker and prostate cancer brought to ED after one episode of bright red bleeding per rectum.  Active Problems:   Lower GI bleed Patient has a colonoscopy in 2012 with removal of tubular adenomas from ascending and sigmoid colon.  Plan was to discontinue surveillance due to his advanced age and dementia. She was admitted in March 2019 due to nausea and vomiting, CT abdomen found multiple focal diverticulosis and fecal impaction. His bleeding episode can be from diverticular bleed, internal hemorrhoids or some ulceration due to chronic constipation. Patient is not on any blood thinner. GI was consulted from ED and they recommend conservative management. If he continued to bleed they will do a tagged RBC scan possible embolization as needed. Colonoscopy will only be done as a last  resort. -Monitor CBC this afternoon and again in morning-if remains stable with no more bleeding patient can be discharged home. -Add a good bowel regimen with stool softeners and Metamucil. -Soft diet.  Hypertension.  On initial presentation he was normotensive, normal blood pressure in 017C systolic. -Continuing home Bidil and amlodipine. -Holding home Tenormin to prevent any masking of tachycardia secondary to blood loss. -Can be restarted from tomorrow if hemoglobin remains stable and no more episodes of bleeding.  Advanced dementia.  Patient is very hard of hearing and oriented to self only.  Is completely dependent on his wife for all ADLs. -Continuing home dose of Seroquel, Depakote and Zoloft.  Diabetes.  On Tradjenta and Lantus as needed. -SSI-blood scale.  HFrEF.  Echo done in February 2018 shows ejection fraction of 30 to 35% with grade 2 diastolic dysfunction.  No sign of volume overload. Uses Lasix 20 mg as needed. -Continue home dose of BiDil.  CODE STATUS.  DNR with full scope of medical treatment for reversible  causes. DVT prophylaxis.  SCDs Diet.  Soft  Dispo: Admit patient to Observation with expected length of stay less than 2 midnights.  SignedLorella Nimrod, MD 05/11/2018, 3:25 PM  Pager: [phone]

## 2018-05-11 NOTE — Progress Notes (Signed)
Pt new admit from ED for lower GI bleeding , alert x1 with caregiver at the bedside, [facility], with condom cath, no rectal bleeding noted.

## 2018-05-11 NOTE — ED Provider Notes (Signed)
[facility] EMERGENCY DEPARTMENT Provider Note   CSN: [id number] Arrival date & time: 05/11/18  4098     History   Chief Complaint Chief Complaint  Patient presents with  . Rectal Bleeding    HPI [name] is a 84 y.o. male hx of CAD, CKD, dementia, DM, HTN, here presenting with bright red blood per rectum.  Patient lives at home with his wife.  Per the wife, patient is demented at baseline and she noticed that there is a distinct smell in the bathroom.  She went over this morning and noticed that he has red blood in his commode.  Patient denies any abdominal pain and has no complaints.  She is not currently on blood thinners.  Patient has seen Dr. Cristina Gong in the past. Patient is DNR per wife.   The history is provided by the patient and the spouse.  Level V caveat- dementia   Past Medical History:  Diagnosis Date  . CAD (coronary artery disease)    a. CABG 1991 and 2006 LIMA LAD, seq. SVG D1-Cfx, SVG d2,and seq. SVG AM-PDA   . Chronic systolic CHF (congestive heart failure) ([place])   . CKD (chronic kidney disease)   . Dementia ([place])   . Depression   . Diabetes mellitus   . HTN (hypertension)   . Left leg DVT ([place])   . Presence of permanent cardiac pacemaker   . Prostate cancer ([place])   . Tachycardia-bradycardia syndrome [facility])     Patient Active Problem List   Diagnosis Date Noted  . Type II diabetes mellitus with renal manifestations ([place]) 02/27/2018  . Chronic combined systolic (congestive) and diastolic (congestive) heart failure ([place]) 02/27/2018  . Elevated troponin 02/27/2018  . Acute metabolic encephalopathy 11/91/4782  . Acute renal failure superimposed on chronic kidney disease ([place])   . CAD (coronary artery disease) of artery bypass graft 12/16/2017  . Colitis 10/19/2017  . Globus pharyngeus 01/03/2017  . Gastroesophageal reflux disease without esophagitis 01/03/2017  . Palliative care encounter   . Community acquired pneumonia of  left lower lobe of lung ([place])   . Left lower lobe pneumonia ([place]) 09/15/2016  . Nausea and vomiting 09/15/2016  . Essential hypertension 09/15/2016  . Depression 09/15/2016  . Controlled type 2 diabetes mellitus with diabetic nephropathy, without long-term current use of insulin ([place]) 09/15/2016  . CKD stage 4 due to type 2 diabetes mellitus ([place]) 09/15/2016  . Influenza with pneumonia 09/15/2016  . Acute on chronic combined systolic and diastolic heart failure ([place]) 09/15/2016  . Bilateral hearing loss 04/27/2016  . ICH (intracerebral hemorrhage) ([place]) 02/09/2016  . Dementia ([place]) 06/06/2014  . Hyperlipidemia 04/18/2012  . Prostate cancer ([place]) 04/18/2012  . Cognitive deficits 10/07/2011  . Pacemaker     Past Surgical History:  Procedure Laterality Date  . CORONARY ARTERY BYPASS GRAFT    . PROSTATECTOMY          Home Medications    Prior to Admission medications   Medication Sig Start Date End Date Taking? Authorizing Provider  acetaminophen (TYLENOL) 500 MG tablet Take 500 mg by mouth every 6 (six) hours as needed.   Yes [provider]  amLODipine (NORVASC) 10 MG tablet Take 10 mg by mouth daily. 01/22/18  Yes [provider]  atenolol (TENORMIN) 50 MG tablet Take 50 mg by mouth 2 (two) times daily.    Yes [provider]  CALCIUM PO Take 600 mg by mouth 2 (two) times daily.  Yes [provider]  Coenzyme Q10 (COQ10 PO) Take 1 capsule by mouth daily.   Yes [provider]  divalproex (DEPAKOTE SPRINKLE) 125 MG capsule Take 125 mg by mouth 2 (two) times daily. 02/21/18  Yes [provider]  feeding supplement, GLUCERNA SHAKE, (GLUCERNA SHAKE) LIQD Take 237 mLs by mouth 3 (three) times daily as needed (feeding supplement).   Yes [provider]  furosemide (LASIX) 20 MG tablet Take 1 tablet (20 mg total) by mouth daily as needed. 10/22/17  Yes Florencia Reasons, MD  insulin glargine (LANTUS) 100 UNIT/ML injection Inject 6-18  Units into the skin as needed. Sliding scale    Yes [provider]  IRON PO Take 1 tablet by mouth daily.   Yes [provider]  isosorbide-hydrALAZINE (BIDIL) 20-37.5 MG tablet Take 1 tablet by mouth 3 (three) times daily. 11/15/15  Yes Rivet, Sindy [place], MD  linagliptin (TRADJENTA) 5 MG TABS tablet Take 7.5 mg by mouth daily.   Yes [provider]  Multiple Vitamins-Minerals (ICAPS PO) Take 1 tablet by mouth 2 (two) times daily.   Yes [provider]  Multiple Vitamins-Minerals (MULTIVITAMIN ADULT PO) Take 1 capsule by mouth daily.   Yes [provider]  polyethylene glycol (MIRALAX / GLYCOLAX) packet Take 17 g by mouth daily as needed for moderate constipation.   Yes [provider]  Probiotic Product (ALIGN PO) Take 1 capsule by mouth daily.   Yes [provider]  QUEtiapine (SEROQUEL) 25 MG tablet Take 0.5 tablets (12.5 mg total) by mouth at bedtime. Patient taking differently: Take 50 mg by mouth at bedtime.  02/28/18  Yes Georgette Shell, MD  SENNA PO Take 8.6 mg by mouth daily.    Yes [provider]  sertraline (ZOLOFT) 25 MG tablet Take 25 mg by mouth daily.    Yes [provider]  ezetimibe (ZETIA) 10 MG tablet Take 1 tablet (10 mg total) by mouth daily. Patient not taking: Reported on 05/11/2018 10/22/17   Florencia Reasons, MD  Glucose Blood (FREESTYLE LITE TEST VI) USE AS DIRECTED 06/02/13   [provider]    Family History Family History  Problem Relation Age of Onset  . Diabetes Mother   . Stroke Mother   . Heart disease Sister   . Heart disease Sister   . Heart disease Sister   . Heart disease Sister     Social History Social History   Tobacco Use  . Smoking status: Never Smoker  . Smokeless tobacco: Never Used  Substance Use Topics  . Alcohol use: No    Alcohol/week: 0.0 standard drinks  . Drug use: No     Allergies   Iodides; Iodinated diagnostic agents; Iohexol; Other; Ace  inhibitors; Simvastatin; and Aspirin   Review of Systems Review of Systems  Gastrointestinal: Positive for blood in stool and hematochezia.  All other systems reviewed and are negative.    Physical Exam Updated Vital Signs BP (!) 157/69   Pulse 76   Resp 16   SpO2 100%   Physical Exam  Constitutional:  Demented, NAD   HENT:  Head: Normocephalic.  Mouth/Throat: Oropharynx is clear and moist.  Eyes: Pupils are equal, round, and reactive to light. Conjunctivae and EOM are normal.  Neck: Normal range of motion. Neck supple.  Cardiovascular: Normal rate, regular rhythm and normal heart sounds.  Pulmonary/Chest: Effort normal and breath sounds normal. No stridor. No respiratory distress.  Abdominal: Soft. Bowel sounds are normal. He exhibits  no distension.  Genitourinary:  Genitourinary Comments: Rectal- no hemorrhoids, gross blood   Musculoskeletal: Normal range of motion.  Neurological: He is alert.  Demented, moving all extremities   Skin: Skin is warm.  Psychiatric:  Unable   Nursing note and vitals reviewed.    ED Treatments / Results  Labs (all labs ordered are listed, but only abnormal results are displayed) Labs Reviewed  COMPREHENSIVE METABOLIC PANEL - Abnormal; Notable for the following components:      Result Value   CO2 19 (*)    Glucose, Bld 246 (*)    BUN 65 (*)    Creatinine, Ser 3.10 (*)    Total Protein 6.2 (*)    GFR calc non Af Amer 16 (*)    GFR calc Af Amer 19 (*)    All other components within normal limits  CBC - Abnormal; Notable for the following components:   RBC 3.21 (*)    Hemoglobin 9.4 (*)    HCT 29.1 (*)    All other components within normal limits  POC OCCULT BLOOD, ED - Abnormal; Notable for the following components:   Fecal Occult Bld POSITIVE (*)    All other components within normal limits  TYPE AND SCREEN    EKG EKG Interpretation  Date/Time:  Thursday May 11 2018 09:17:37 EDT Ventricular Rate:  66 PR Interval:     QRS Duration: 166 QT Interval:  469 QTC Calculation: 492 R Axis:   -77 Text Interpretation:  Atrial-ventricular dual-paced rhythm No further analysis attempted due to paced rhythm No significant change since last tracing Confirmed by Wandra Arthurs [phone]) on 05/11/2018 10:13:53 AM   Radiology No results found.  Procedures Procedures (including critical care time)  Medications Ordered in ED Medications  divalproex (DEPAKOTE) DR tablet 125 mg (125 mg Oral Given 05/11/18 1037)  sodium chloride 0.9 % bolus 500 mL (0 mLs Intravenous Stopped 05/11/18 1013)  QUEtiapine (SEROQUEL) tablet 12.5 mg (12.5 mg Oral Given 05/11/18 1038)     Initial Impression / Assessment and Plan / ED Course  I have reviewed the triage vital signs and the nursing notes.  Pertinent labs & imaging results that were available during my care of the patient were reviewed by me and considered in my medical decision making (see chart for details).    [name] is a 84 y.o. male here with bright red blood per rectum. Likely lower GI bleed from diverticulosis. Not hypotensive in the ED. No abdominal tenderness. Will get CBC, CMP, type and screen. Will likely need admission.   10:57 AM Hg 9.4, slightly lower than baseline. BUN 65, Cr 3.1, baseline. I think likely diverticular bleed. Hemodynamically stable. Patient DNR. I called Dr. Therisa Doyne who is covering Dr. Cristina Gong and she will see patient. Recommend serial CBCs and observation. Internal medicine teaching service to admit.    Final Clinical Impressions(s) / ED Diagnoses   Final diagnoses:  None    ED Discharge Orders    None       Drenda Freeze, MD 05/11/18 1057

## 2018-05-11 NOTE — ED Notes (Signed)
Attempted report x1. 

## 2018-05-12 DIAGNOSIS — K625 Hemorrhage of anus and rectum: Secondary | ICD-10-CM | POA: Diagnosis present

## 2018-05-12 DIAGNOSIS — K5731 Diverticulosis of large intestine without perforation or abscess with bleeding: Secondary | ICD-10-CM | POA: Diagnosis present

## 2018-05-12 DIAGNOSIS — I10 Essential (primary) hypertension: Secondary | ICD-10-CM | POA: Diagnosis not present

## 2018-05-12 DIAGNOSIS — I5042 Chronic combined systolic (congestive) and diastolic (congestive) heart failure: Secondary | ICD-10-CM | POA: Diagnosis present

## 2018-05-12 DIAGNOSIS — Z950 Presence of cardiac pacemaker: Secondary | ICD-10-CM | POA: Diagnosis not present

## 2018-05-12 DIAGNOSIS — F329 Major depressive disorder, single episode, unspecified: Secondary | ICD-10-CM | POA: Diagnosis present

## 2018-05-12 DIAGNOSIS — I2510 Atherosclerotic heart disease of native coronary artery without angina pectoris: Secondary | ICD-10-CM | POA: Diagnosis present

## 2018-05-12 DIAGNOSIS — E1165 Type 2 diabetes mellitus with hyperglycemia: Secondary | ICD-10-CM | POA: Diagnosis present

## 2018-05-12 DIAGNOSIS — E119 Type 2 diabetes mellitus without complications: Secondary | ICD-10-CM | POA: Diagnosis not present

## 2018-05-12 DIAGNOSIS — Z888 Allergy status to other drugs, medicaments and biological substances status: Secondary | ICD-10-CM | POA: Diagnosis not present

## 2018-05-12 DIAGNOSIS — F05 Delirium due to known physiological condition: Secondary | ICD-10-CM | POA: Diagnosis present

## 2018-05-12 DIAGNOSIS — H9190 Unspecified hearing loss, unspecified ear: Secondary | ICD-10-CM | POA: Diagnosis present

## 2018-05-12 DIAGNOSIS — N184 Chronic kidney disease, stage 4 (severe): Secondary | ICD-10-CM | POA: Diagnosis present

## 2018-05-12 DIAGNOSIS — Z833 Family history of diabetes mellitus: Secondary | ICD-10-CM | POA: Diagnosis not present

## 2018-05-12 DIAGNOSIS — D631 Anemia in chronic kidney disease: Secondary | ICD-10-CM | POA: Diagnosis present

## 2018-05-12 DIAGNOSIS — F0150 Vascular dementia without behavioral disturbance: Secondary | ICD-10-CM | POA: Diagnosis present

## 2018-05-12 DIAGNOSIS — Z8249 Family history of ischemic heart disease and other diseases of the circulatory system: Secondary | ICD-10-CM | POA: Diagnosis not present

## 2018-05-12 DIAGNOSIS — Z86718 Personal history of other venous thrombosis and embolism: Secondary | ICD-10-CM | POA: Diagnosis not present

## 2018-05-12 DIAGNOSIS — I495 Sick sinus syndrome: Secondary | ICD-10-CM | POA: Diagnosis present

## 2018-05-12 DIAGNOSIS — K5909 Other constipation: Secondary | ICD-10-CM | POA: Diagnosis present

## 2018-05-12 DIAGNOSIS — Z66 Do not resuscitate: Secondary | ICD-10-CM | POA: Diagnosis present

## 2018-05-12 DIAGNOSIS — I130 Hypertensive heart and chronic kidney disease with heart failure and stage 1 through stage 4 chronic kidney disease, or unspecified chronic kidney disease: Secondary | ICD-10-CM | POA: Diagnosis present

## 2018-05-12 DIAGNOSIS — Z951 Presence of aortocoronary bypass graft: Secondary | ICD-10-CM | POA: Diagnosis not present

## 2018-05-12 DIAGNOSIS — E1122 Type 2 diabetes mellitus with diabetic chronic kidney disease: Secondary | ICD-10-CM | POA: Diagnosis present

## 2018-05-12 DIAGNOSIS — Z23 Encounter for immunization: Secondary | ICD-10-CM | POA: Diagnosis present

## 2018-05-12 DIAGNOSIS — R358 Other polyuria: Secondary | ICD-10-CM | POA: Diagnosis present

## 2018-05-12 DIAGNOSIS — Z8546 Personal history of malignant neoplasm of prostate: Secondary | ICD-10-CM | POA: Diagnosis not present

## 2018-05-12 LAB — GLUCOSE, CAPILLARY
GLUCOSE-CAPILLARY: 194 mg/dL — AB (ref 70–99)
GLUCOSE-CAPILLARY: 119 mg/dL — AB (ref 70–99)
GLUCOSE-CAPILLARY: 151 mg/dL — AB (ref 70–99)
Glucose-Capillary: 134 mg/dL — ABNORMAL HIGH (ref 70–99)

## 2018-05-12 LAB — BASIC METABOLIC PANEL
Anion gap: 10 (ref 5–15)
Anion gap: 7 (ref 5–15)
BUN: 61 mg/dL — ABNORMAL HIGH (ref 8–23)
BUN: 61 mg/dL — AB (ref 8–23)
CALCIUM: 9.5 mg/dL (ref 8.9–10.3)
CHLORIDE: 108 mmol/L (ref 98–111)
CO2: 22 mmol/L (ref 22–32)
CO2: 24 mmol/L (ref 22–32)
CREATININE: 2.89 mg/dL — AB (ref 0.61–1.24)
CREATININE: 2.97 mg/dL — AB (ref 0.61–1.24)
Calcium: 9.4 mg/dL (ref 8.9–10.3)
Chloride: 106 mmol/L (ref 98–111)
GFR calc Af Amer: 20 mL/min — ABNORMAL LOW (ref >60)
GFR, EST AFRICAN AMERICAN: 20 mL/min — AB (ref >60)
GFR, EST NON AFRICAN AMERICAN: 17 mL/min — AB (ref >60)
GFR, EST NON AFRICAN AMERICAN: 17 mL/min — AB (ref >60)
GLUCOSE: 142 mg/dL — AB (ref 70–99)
Glucose, Bld: 214 mg/dL — ABNORMAL HIGH (ref 70–99)
Potassium: 3.9 mmol/L (ref 3.5–5.1)
Potassium: 3.7 mmol/L (ref 3.5–5.1)
SODIUM: 139 mmol/L (ref 135–145)
Sodium: 138 mmol/L (ref 135–145)

## 2018-05-12 LAB — HEMOGLOBIN AND HEMATOCRIT, BLOOD
HCT: 25.4 % — ABNORMAL LOW (ref 39.0–52.0)
Hemoglobin: 8.7 g/dL — ABNORMAL LOW (ref 13.0–17.0)

## 2018-05-12 LAB — CBC
HCT: 27.9 % — ABNORMAL LOW (ref 39.0–52.0)
Hemoglobin: 9.1 g/dL — ABNORMAL LOW (ref 13.0–17.0)
MCH: 29.1 pg (ref 26.0–34.0)
MCHC: 32.6 g/dL (ref 30.0–36.0)
MCV: 89.1 fL (ref 80.0–100.0)
NRBC: 0 % (ref 0.0–0.2)
PLATELETS: 163 10*3/uL (ref 150–400)
RBC: 3.13 MIL/uL — ABNORMAL LOW (ref 4.22–5.81)
RDW: 12.4 % (ref 11.5–15.5)
WBC: 4.4 10*3/uL (ref 4.0–10.5)

## 2018-05-12 MED ORDER — ZIPRASIDONE MESYLATE 20 MG IM SOLR: 10.0000 mg | Freq: Once | INTRAMUSCULAR | Status: DC

## 2018-05-12 MED ORDER — POLYETHYLENE GLYCOL 3350 17 G PO PACK
17.0000 g | PACK | Freq: Three times a day (TID) | ORAL | Status: DC
  Administered 2018-05-12 – 2018-05-13 (×3): 17 g via ORAL
  Filled 2018-05-12 (×3): qty 1

## 2018-05-12 MED ORDER — QUETIAPINE FUMARATE 50 MG PO TABS: 25.0000 mg | ORAL_TABLET | Freq: Every day | ORAL | Status: DC | PRN

## 2018-05-12 MED ORDER — ZIPRASIDONE MESYLATE 20 MG IM SOLR
10.0000 mg | Freq: Once | INTRAMUSCULAR | Status: AC
  Administered 2018-05-12: 10 mg via INTRAMUSCULAR
  Filled 2018-05-12: qty 20

## 2018-05-12 MED ORDER — SODIUM CHLORIDE 0.9 % IV SOLN
INTRAVENOUS | Status: AC
  Administered 2018-05-12: 09:00:00 via INTRAVENOUS

## 2018-05-12 MED ORDER — ZIPRASIDONE MESYLATE 20 MG IM SOLR
10.0000 mg | Freq: Four times a day (QID) | INTRAMUSCULAR | Status: DC | PRN
  Administered 2018-05-12: 10 mg via INTRAMUSCULAR
  Filled 2018-05-12 (×2): qty 20

## 2018-05-12 MED ORDER — QUETIAPINE FUMARATE 50 MG PO TABS
50.0000 mg | ORAL_TABLET | Freq: Every day | ORAL | Status: DC
  Administered 2018-05-12: 50 mg via ORAL
  Filled 2018-05-12: qty 1

## 2018-05-12 NOTE — Progress Notes (Addendum)
Patient woke up, pulled out his gown and telemetry leads and trying to get out of bed.  Private sitter called pt's RN, reoriented patient and patient went back to bed but after few minutes patient, wants to get out of bed and agitated.  Text paged on-call IMTS and Dr. Truman Hayward came in to see and talked to patient.  Dr. Truman Hayward put on new orders for Geodon and seroquel PRN for acute agitation and dementia respectively.  CCMD was made aware of the telemetry off and will be connected as soon as PRN medication can be administered.

## 2018-05-12 NOTE — Progress Notes (Signed)
Medicine attending: I examined this patient today together with resident physician Dr Vilma Prader and I concur with her evaluation and management plan which will be detailed in her progress note to follow.  He remains hemodynamically stable.  He is more alert and interactive today. Hemoglobin also stable after initial 1 g fall from recent baseline on admission yesterday.  No further rectal bleeding noted. He became agitated during the night.  He was unable to be redirected.  At the suggestion of his physician daughter, a dose of Geodon was given with good result. BUN 61, creatinine 2.9.  Known stage IV CKD.  Admission values BUN 65, creatinine 3.1.  Patient's daughter feels that his BUN should be lower based on her previous experience when he is well hydrated.  We will initiate gentle hydration.  He did get 500 mL's of saline in the emergency department yesterday.  Impression: Likely diverticular hemorrhage.  Currently stable. Gentle hydration today.  Follow-up lab tomorrow then if stable anticipate discharge to home. Patient's daughter present.  Status and management plan reviewed.

## 2018-05-12 NOTE — Progress Notes (Signed)
Administered PRN Geodon 10 mg IM per order, connected back telemetry monitor, repositioned patient and have pt. take a sip of water. Pt. Now resting on bed comfortably with both eyes closed and private sitter at bedside.  Will closely monitor.

## 2018-05-12 NOTE — Progress Notes (Signed)
Subjective: The patient was seen and examined at bedside. Has not had a bowel movement since admission, tolerating regular diet. Documentation from overnight reveal that the patient was disoriented and agitated and required Seroquel and Geodon overnight.  Objective: Vital signs in last 24 hours: Temp:  [97.6 F (36.4 C)-98.4 F (36.9 C)] 97.6 F (36.4 C) (10/11 0619) Pulse Rate:  [67-76] 67 (10/11 0619) Resp:  [16-18] 18 (10/11 0619) BP: (150)/(65) 150/65 (10/10 1337) SpO2:  [100 %] 100 % (10/11 0619) Weight change:  Last BM Date: 05/10/18  WU:[id number], thin, frail GENERAL:not in distress, mild pallor ABDOMEN:soft, nontender, nondistended, normoactive bowel sounds EXTREMITIES:no deformity  Lab Results: Results for orders placed or performed during the hospital encounter of 05/11/18 (from the past 48 hour(s))  POC occult blood, ED     Status: Abnormal   Collection Time: 05/11/18  8:52 AM  Result Value Ref Range   Fecal Occult Bld POSITIVE (A) NEGATIVE  Type and screen [place]     Status: None   Collection Time: 05/11/18  9:11 AM  Result Value Ref Range   ABO/RH(D) AB POS    Antibody Screen NEG    Sample Expiration      05/14/2018 Performed at [facility], [place] [address], [place], [place] [address]   Comprehensive metabolic panel     Status: Abnormal   Collection Time: 05/11/18  9:26 AM  Result Value Ref Range   Sodium 140 135 - 145 mmol/L   Potassium 4.6 3.5 - 5.1 mmol/L   Chloride 110 98 - 111 mmol/L   CO2 19 (L) 22 - 32 mmol/L   Glucose, Bld 246 (H) 70 - 99 mg/dL   BUN 65 (H) 8 - 23 mg/dL   Creatinine, Ser 3.10 (H) 0.61 - 1.24 mg/dL   Calcium 10.0 8.9 - 10.3 mg/dL   Total Protein 6.2 (L) 6.5 - 8.1 g/dL   Albumin 3.5 3.5 - 5.0 g/dL   AST 19 15 - 41 U/L   ALT 16 0 - 44 U/L   Alkaline Phosphatase 52 38 - 126 U/L   Total Bilirubin 0.8 0.3 - 1.2 mg/dL   GFR calc non Af Amer 16 (L) >60 mL/min   GFR calc Af Amer 19 (L) >60 mL/min     Comment: (NOTE) The eGFR has been calculated using the CKD EPI equation. This calculation has not been validated in all clinical situations. eGFR's persistently <60 mL/min signify possible Chronic Kidney Disease.    Anion gap 11 5 - 15    Comment: Performed at [facility] [address] [place], [place] [address]  CBC     Status: Abnormal   Collection Time: 05/11/18  9:26 AM  Result Value Ref Range   WBC 5.5 4.0 - 10.5 K/uL   RBC 3.21 (L) 4.22 - 5.81 MIL/uL   Hemoglobin 9.4 (L) 13.0 - 17.0 g/dL   HCT 29.1 (L) 39.0 - 52.0 %   MCV 90.7 80.0 - 100.0 fL   MCH 29.3 26.0 - 34.0 pg   MCHC 32.3 30.0 - 36.0 g/dL   RDW 12.5 11.5 - 15.5 %   Platelets 178 150 - 400 K/uL   nRBC 0.0 0.0 - 0.2 %    Comment: Performed at [facility], [place] [address], [place], [place] [address]  Glucose, capillary     Status: Abnormal   Collection Time: 05/11/18  1:41 PM  Result Value Ref Range   Glucose-Capillary 232 (H)  70 - 99 mg/dL  Glucose, capillary     Status: Abnormal   Collection Time: 05/11/18  4:43 PM  Result Value Ref Range   Glucose-Capillary 183 (H) 70 - 99 mg/dL  CBC     Status: Abnormal   Collection Time: 05/11/18  7:34 PM  Result Value Ref Range   WBC 5.1 4.0 - 10.5 K/uL   RBC 3.03 (L) 4.22 - 5.81 MIL/uL   Hemoglobin 8.9 (L) 13.0 - 17.0 g/dL   HCT 26.8 (L) 39.0 - 52.0 %   MCV 88.4 80.0 - 100.0 fL   MCH 29.4 26.0 - 34.0 pg   MCHC 33.2 30.0 - 36.0 g/dL   RDW 12.4 11.5 - 15.5 %   Platelets 166 150 - 400 K/uL   nRBC 0.0 0.0 - 0.2 %    Comment: Performed at [facility], [place] [address], [place], [place] [address]  Glucose, capillary     Status: Abnormal   Collection Time: 05/11/18  9:56 PM  Result Value Ref Range   Glucose-Capillary 152 (H) 70 - 99 mg/dL  Basic metabolic panel     Status: Abnormal   Collection Time: 05/12/18  5:04 AM  Result Value Ref Range   Sodium 138 135 - 145 mmol/L   Potassium 3.9 3.5 - 5.1 mmol/L   Chloride 106 98 - 111 mmol/L   CO2 22 22 -  32 mmol/L   Glucose, Bld 214 (H) 70 - 99 mg/dL   BUN 61 (H) 8 - 23 mg/dL   Creatinine, Ser 2.89 (H) 0.61 - 1.24 mg/dL   Calcium 9.5 8.9 - 10.3 mg/dL   GFR calc non Af Amer 17 (L) >60 mL/min   GFR calc Af Amer 20 (L) >60 mL/min    Comment: (NOTE) The eGFR has been calculated using the CKD EPI equation. This calculation has not been validated in all clinical situations. eGFR's persistently <60 mL/min signify possible Chronic Kidney Disease.    Anion gap 10 5 - 15    Comment: Performed at [facility] [address], [place], [place] [address]  CBC     Status: Abnormal   Collection Time: 05/12/18  5:04 AM  Result Value Ref Range   WBC 4.4 4.0 - 10.5 K/uL   RBC 3.13 (L) 4.22 - 5.81 MIL/uL   Hemoglobin 9.1 (L) 13.0 - 17.0 g/dL   HCT 27.9 (L) 39.0 - 52.0 %   MCV 89.1 80.0 - 100.0 fL   MCH 29.1 26.0 - 34.0 pg   MCHC 32.6 30.0 - 36.0 g/dL   RDW 12.4 11.5 - 15.5 %   Platelets 163 150 - 400 K/uL   nRBC 0.0 0.0 - 0.2 %    Comment: Performed at [facility], [place] [address], [place], [place] [address]  Glucose, capillary     Status: Abnormal   Collection Time: 05/12/18  8:00 AM  Result Value Ref Range   Glucose-Capillary 194 (H) 70 - 99 mg/dL    Studies/Results: No results found.  Medications: I have reviewed the patient's current medications.  Assessment: One episode of painless hematochezia, hemoglobin remained stable at 8.9/9.1, hematochezia resolved since admission. This could be due to internal hemorrhoids/diverticulosis/stercoral ulcers from constipation  Plan: Discussed with the patient's wife and daughter(ER physician) at bedside as well as another daughter who is a gastroenterologist over the phone. We would want to avoid colonoscopy/sigmoidoscopy due to his advanced age and multiple comorbidities. Patient to be started on MiraLAX once  a day and continued on stool softener 200 mg twice a day. Hemoglobin to be rechecked after receiving second unit of IV  fluid. If hemoglobin remains stable, plan to discharge today/tomorrow morning.    Ronnette Juniper 05/12/2018, 12:31 PM   Pager [phone] If no answer or after 5 PM call [phone]

## 2018-05-12 NOTE — Progress Notes (Signed)
Text paged on-call IMTS, Dr. Truman Hayward regarding patient pulling out again telemetry leads and refusing to wear gown.  Reoriented patient that it is still 4 am and have to go back to sleep, covered patient with blankets and informed Dr. Truman Hayward if we could DC the telemetry monitor, CCMD made aware.  Awaiting call back or order.

## 2018-05-12 NOTE — Progress Notes (Signed)
Received order to discontinue telemetry monitor and CCMD was made aware.

## 2018-05-12 NOTE — Progress Notes (Signed)
   Subjective: Overnight, [name] was agitated - pulling off his gown, pulling off his leads, and getting out of bed. He received a PRN dose of Geodone 10mg  on top of his home dose of Seroquel 50mg . According to his daughter who is a physician and spoke with the night team, he has demonstrated similar behavior during past hospitalizations requiring PRN Seroquel and Geodone. This morning, the patient is lying comfortably in bed with his daughter at bedside. He has had no further episodes of hematochezia since admission.  Objective:  Vital signs in last 24 hours: Vitals:   05/11/18 1000 05/11/18 1218 05/11/18 1337 05/12/18 0619  BP: (!) 157/69  (!) 150/65   Pulse: 76 73 76 67  Resp: 16 (!) 24 16 18   Temp:   98.4 F (36.9 C) 97.6 F (36.4 C)  TempSrc:   Oral Oral  SpO2: 100% 100% 100% 100%   Constitutional: Well-developed, well-nourished, and in no distress.  Cardiovascular: Normal rate and regular rhythm. No murmurs, rubs, or gallops. Pulmonary/Chest: Effort normal. Clear to auscultation bilaterally. No wheezes, rales, or rhonchi. Abdominal: Bowel sounds present. Soft, non-distended, non-tender. Ext: No lower extremity edema. Back: No gross blood seen in the rectum. Skin: Warm and dry. No rashes or wounds.  Assessment/Plan:  Active Problems:   Cardiac pacemaker in situ   Benign essential HTN   Chronic renal insufficiency, stage 4 (severe) (HCC)   Diabetes mellitus type 2, insulin dependent (HCC)   Lower GI bleed   Sinus bradycardia-tachycardia syndrome (HCC)   Diverticulosis of colon with hemorrhage  Osborne Casco Edwardsis a 84 y.o.malewith medical history of CAD, CKD, DM, HTN,combined systolic and diastolic heart failure, tachybrady syndrome s/p dual-chamber pacemaker, prostate cancer, and dementia who presented after one episode of bright red blood per rectum. Hb on arrival was 9.4 (down from 10.6 three months prior).  Lower GI bleed - Hb stable today at 9.1. No further  episodes of hematochezia or melena since admission. Most likely diverticular bleed given evidence of diverticulosis on recent CT abdomen. GI recommends conservative management and avoidance endoscopy. Will check one more Hb. If stable, he will likely be safe for discharge home. Plan - Repeat Hb at 1pm - Gentle hydration at 50cc/hr  Hypertension - Patient has been mildly hypertensive in the 102V systolic.  Plan -Continuing home Bidil and amlodipine. -Holding atenolol. Will resume at discharge.  Advanced dementia - Patient is very hard of hearing and oriented to self only.  Is completely dependent on his wife for all ADLs. - Overnight he became agitated, requiring a PRN dose of Geodon. Likely hospital delirium. Patient appeared comfortable this morning. Plan - Continuing home dose of Seroquel, Depakote and Zoloft. - PRN Geodon  Dispo: Anticipated discharge today or tomorrow depending on repeat Hb  [name], Andree Elk, MD 05/12/2018, 6:47 AM Pager: [phone]

## 2018-05-12 NOTE — Progress Notes (Signed)
Paged to bedside that daughter would like to talk about a nephrology consult. She tells me that she approached the subject of getting a urology consult due to increased urine output over the past 4 to 6 weeks; however, was told that this would need to be addressed as an outpatient. She continues to perseverate on the polyuria and is now asking about a nephrology consult. She states that herself and her sister have both run through their differential diagnoses for polyuria and have not figured out the cause and would like further evaluation. Their concern is that he will continue to have polyuria and be unable to keep up with PO intake and become hypernatremic. We had a long discussion about the possibilities and what could be done.  It does not appear that his polyuria secondary to increased PO intake or medication use. The patient's home medications does include furosemide however the daughter tells me that he has not used this in approximately two months. The patient is not hypernatremic and most recent UA on 7/29 was with a fairly normal specific gravity of 1.008. However the daughter states that his problems began after this date and so she feels the UA is not an accurate representation of his current urine output. We discussed repeating a UA and looking at the specific gravity to determine how to proceed as an outpatient. Patient is unlikely to have diabetes insipidus, and it is more likely related to his chronic renal failure. Further workup can be pursued on an outpatient basis.

## 2018-05-13 ENCOUNTER — Encounter: Payer: Self-pay | Admitting: Internal Medicine

## 2018-05-13 LAB — BASIC METABOLIC PANEL
ANION GAP: 11 (ref 5–15)
BUN: 63 mg/dL — ABNORMAL HIGH (ref 8–23)
CO2: 18 mmol/L — AB (ref 22–32)
CREATININE: 3.21 mg/dL — AB (ref 0.61–1.24)
Calcium: 9.1 mg/dL (ref 8.9–10.3)
Chloride: 111 mmol/L (ref 98–111)
GFR calc non Af Amer: 15 mL/min — ABNORMAL LOW (ref >60)
GFR, EST AFRICAN AMERICAN: 18 mL/min — AB (ref >60)
Glucose, Bld: 209 mg/dL — ABNORMAL HIGH (ref 70–99)
Potassium: 4.3 mmol/L (ref 3.5–5.1)
Sodium: 140 mmol/L (ref 135–145)

## 2018-05-13 LAB — URINALYSIS, COMPLETE (UACMP) WITH MICROSCOPIC
BACTERIA UA: NONE SEEN
BILIRUBIN URINE: NEGATIVE
Glucose, UA: 50 mg/dL — AB
Hgb urine dipstick: NEGATIVE
Ketones, ur: NEGATIVE mg/dL
NITRITE: NEGATIVE
PH: 6 (ref 5.0–8.0)
Protein, ur: NEGATIVE mg/dL
SPECIFIC GRAVITY, URINE: 1.008 (ref 1.005–1.030)

## 2018-05-13 LAB — OSMOLALITY, URINE: OSMOLALITY UR: 303 mosm/kg (ref 300–900)

## 2018-05-13 LAB — CBC
HCT: 24.6 % — ABNORMAL LOW (ref 39.0–52.0)
Hemoglobin: 8.4 g/dL — ABNORMAL LOW (ref 13.0–17.0)
MCH: 30.4 pg (ref 26.0–34.0)
MCHC: 34.1 g/dL (ref 30.0–36.0)
MCV: 89.1 fL (ref 80.0–100.0)
NRBC: 0 % (ref 0.0–0.2)
PLATELETS: 151 10*3/uL (ref 150–400)
RBC: 2.76 MIL/uL — ABNORMAL LOW (ref 4.22–5.81)
RDW: 12.6 % (ref 11.5–15.5)
WBC: 5.2 10*3/uL (ref 4.0–10.5)

## 2018-05-13 LAB — VITAMIN D 25 HYDROXY (VIT D DEFICIENCY, FRACTURES): VIT D 25 HYDROXY: 45.9 ng/mL (ref 30.0–100.0)

## 2018-05-13 MED ORDER — DOCUSATE SODIUM 100 MG PO CAPS: 200.0000 mg | ORAL_CAPSULE | Freq: Two times a day (BID) | ORAL | 0 refills | Status: AC

## 2018-05-13 NOTE — Progress Notes (Signed)
Pt's daughter spoke to me in regards to her father's agitation and him needing to get some rest. Therefore, pt's daughter would not allow assessment to be done at this time; stated she doesn't feel that it is a necessity at this time. Care provider at beside during the night.

## 2018-05-13 NOTE — Progress Notes (Signed)
Subjective: Somnolent (given medication) resting. No reported blood in stool.  Objective: Vital signs in last 24 hours: Temp:  [98.5 F (36.9 C)-98.7 F (37.1 C)] 98.5 F (36.9 C) (10/11 2154) Pulse Rate:  [60-63] 63 (10/11 2154) Resp:  [18-22] 18 (10/11 2154) BP: (133-139)/(57-66) 133/57 (10/11 2154) SpO2:  [100 %] 100 % (10/11 2154) Weight change:  Last BM Date: 05/10/18  PE: GEN:  Elderly, resting in bed, NAD ABD:  Protuberant, soft, non-tender  Lab Results: CBC    Component Value Date/Time   WBC 5.2 05/13/2018 0655   RBC 2.76 (L) 05/13/2018 0655   HGB 8.4 (L) 05/13/2018 0655   HCT 24.6 (L) 05/13/2018 0655   PLT 151 05/13/2018 0655   MCV 89.1 05/13/2018 0655   MCV 92.7 03/22/2017 1146   MCH 30.4 05/13/2018 0655   MCHC 34.1 05/13/2018 0655   RDW 12.6 05/13/2018 0655   LYMPHSABS 1.0 10/22/2017 0424   MONOABS 0.5 10/22/2017 0424   EOSABS 0.1 10/22/2017 0424   BASOSABS 0.0 10/22/2017 0424   CMP     Component Value Date/Time   NA 140 05/13/2018 0655   NA 143 03/22/2017 1138   K 4.3 05/13/2018 0655   CL 111 05/13/2018 0655   CO2 18 (L) 05/13/2018 0655   GLUCOSE 209 (H) 05/13/2018 0655   BUN 63 (H) 05/13/2018 0655   BUN 54 (H) 03/22/2017 1138   CREATININE 3.21 (H) 05/13/2018 0655   CALCIUM 9.1 05/13/2018 0655   PROT 6.2 (L) 05/11/2018 0926   PROT 6.7 03/22/2017 1138   ALBUMIN 3.5 05/11/2018 0926   ALBUMIN 3.8 03/22/2017 1138   AST 19 05/11/2018 0926   ALT 16 05/11/2018 0926   ALKPHOS 52 05/11/2018 0926   BILITOT 0.8 05/11/2018 0926   BILITOT <0.2 03/22/2017 1138   GFRNONAA 15 (L) 05/13/2018 0655   GFRAA 18 (L) 05/13/2018 0655   Assessment:  1.  Hematochezia, resolved.  Diverticular versus stercoral rectal ulcer versus other.   2.  Anemia.  Stable Hgb for past couple days. 3.  Constipation.  Plan:  1.  Case discussed with patient's family (at bedside) and daughter (who is gastroenterologist in [place]) over the telephone. 2.  Would continue  supportive management (Miralax, stool softeners) to avoid constipation. 3.  No further GI intervention anticipated. 4.  [facility] will sign-off; please call with questions; thank you for the consultation.   Landry Dyke 05/13/2018, 9:59 AM   Cell [phone] If no answer or after 5 PM call [phone]

## 2018-05-13 NOTE — Discharge Summary (Signed)
Name: [name] MRN: [id number] DOB: 1922-08-19 84 y.o. PCP: [name], MD  Date of Admission: 05/11/2018  8:35 AM Date of Discharge: 05/13/18 Attending Physician: Dr. Beryle Beams  Discharge Diagnosis: 1. Lower GI Bleed 2. Polyuria 3. Advanced Dementia  Discharge Medications: Allergies as of 05/13/2018      Reactions   Iodides Anaphylaxis, Other (See Comments)   Iodinated Diagnostic Agents Anaphylaxis   Iohexol Anaphylaxis    Desc: RN states pt stated he had anyphalactic shock reaction to contrast approx 10 years ago.   Other Shortness Of Breath   Ace Inhibitors Other (See Comments)   cough   Simvastatin Other (See Comments)   Muscle aches   Aspirin    Other reaction(s): Abdominal Pain      Medication List    TAKE these medications   acetaminophen 500 MG tablet Commonly known as:  TYLENOL Take 500 mg by mouth every 6 (six) hours as needed.   ALIGN PO Take 1 capsule by mouth daily.   amLODipine 10 MG tablet Commonly known as:  NORVASC Take 10 mg by mouth daily.   atenolol 50 MG tablet Commonly known as:  TENORMIN Take 50 mg by mouth 2 (two) times daily.   CALCIUM PO Take 600 mg by mouth 2 (two) times daily.   COQ10 PO Take 1 capsule by mouth daily.   divalproex 125 MG capsule Commonly known as:  DEPAKOTE SPRINKLE Take 125 mg by mouth 2 (two) times daily.   docusate sodium 100 MG capsule Commonly known as:  COLACE Take 2 capsules (200 mg total) by mouth 2 (two) times daily.   ezetimibe 10 MG tablet Commonly known as:  ZETIA Take 1 tablet (10 mg total) by mouth daily.   feeding supplement (GLUCERNA SHAKE) Liqd Take 237 mLs by mouth 3 (three) times daily as needed (feeding supplement).   FREESTYLE LITE TEST VI USE AS DIRECTED   furosemide 20 MG tablet Commonly known as:  LASIX Take 1 tablet (20 mg total) by mouth daily as needed.   ICAPS PO Take 1 tablet by mouth 2 (two) times daily.   MULTIVITAMIN ADULT PO Take 1 capsule by  mouth daily.   insulin glargine 100 UNIT/ML injection Commonly known as:  LANTUS Inject 6-18 Units into the skin as needed. Sliding scale   IRON PO Take 1 tablet by mouth daily.   isosorbide-hydrALAZINE 20-37.5 MG tablet Commonly known as:  BIDIL Take 1 tablet by mouth 3 (three) times daily.   linagliptin 5 MG Tabs tablet Commonly known as:  TRADJENTA Take 7.5 mg by mouth daily.   polyethylene glycol packet Commonly known as:  MIRALAX / GLYCOLAX Take 17 g by mouth daily as needed for moderate constipation.   QUEtiapine 25 MG tablet Commonly known as:  SEROQUEL Take 0.5 tablets (12.5 mg total) by mouth at bedtime. What changed:  how much to take   SENNA PO Take 8.6 mg by mouth daily.   sertraline 25 MG tablet Commonly known as:  ZOLOFT Take 25 mg by mouth daily.       Disposition and follow-up:   Mr.Shenandoah Z Blumenfeld was discharged from [facility] in Stable condition.  At the hospital follow up visit please address:  1.   Hematochezia likely secondary to diverticular bleed: Monitor for further symptoms. Consider repeat H&H if recurrence of hematochezia.  Polyuria: Follow up urine osmolality in the outpatient setting.    2.  Labs / imaging needed at time of follow-up: None  3.  Pending labs/ test needing follow-up: Urine osmolality   Follow-up Appointments: Follow-up Information    [name], MD Follow up in 1 week(s).   Specialty:  Family Medicine Contact information: [address] [place] [address] [phone]        Belva Crome, MD .   Specialty:  Cardiology Contact information: [phone] [place] [address] [place] by problem list: 1. Lower GI Bleed: Jayshaun Phillips Edwardsis a 84 y.o.malewith medical history of CAD, CKD, DM, HTN,combined systolic and diastolic heart failure, tachybrady syndrome s/pdual-chamber pacemaker, prostate cancer, and  dementia who presented after one episode of bright red blood per rectum. Hb on arrival was 9.4 (down from 10.6 three months prior). He had no further episodes of hematochezia. His hemoglobin remained stable during his hospitalization. He did not He was also gently hydrated. He was evaluated by GI. His bleed was most likely a diverticular bleed given evidence of diverticulosis on a recent abdominal CT. Conservative management with a bowel regimen of stool softeners and laxatives. Colonoscopy not required at this point.   2. Polyuria: The patient's daughter (ER physician) spoke with the medical team about her dad's increased urine output. She has noted that his Depends were heavier in the past few weeks and that he seemed to be urinating a lot while in the hospital with the condom cath. He put out around 2L of urine per day while hospitalized. While this is not markedly large urine output, his mild hyperglycemia and CKD may be contributing. He was normonatremic during his hospitalization. UA with 50 glucose and trace leukocytes. Urine osmolality pending at time of discharge and the medical team will inform family of the results. Polyuria may be further worked up in the outpatient setting. Recommended that the patient follow-up with his PCP who makes house calls.  3. Advanced Dementia: At his baseline, the patient is very hard of hearing and oriented only to self. He is completely dependent on his wife and home health team for all ADLs. He is able to ambulate between his bed and the bathroom with assistance. Overnight in the hospital he experienced agitation and was pulling at his lines and pulling off his gown. Family reports that this is typical behavior for him when he is in the hospital, but not when he's at home. Likely hospital delirium. He was treated with his home dose of evening Seroquel as well as PRN Geodon as requested by the patient's daughter. Upon discharge, he was lying comfortably in bed.    Discharge Vitals:   BP (!) 133/57 (BP Location: Left Arm)   Pulse 63   Temp 98.5 F (36.9 C) (Oral)   Resp 18   SpO2 100%   Pertinent Labs, Studies, and Procedures:  CBC Latest Ref Rng & Units 05/13/2018 05/12/2018 05/12/2018  WBC 4.0 - 10.5 K/uL 5.2 - 4.4  Hemoglobin 13.0 - 17.0 g/dL 8.4(L) 8.7(L) 9.1(L)  Hematocrit 39.0 - 52.0 % 24.6(L) 25.4(L) 27.9(L)  Platelets 150 - 400 K/uL 151 - 163   CMP Latest Ref Rng & Units 05/13/2018 05/12/2018 05/12/2018  Glucose 70 - 99 mg/dL 209(H) 142(H) 214(H)  BUN 8 - 23 mg/dL 63(H) 61(H) 61(H)  Creatinine 0.61 - 1.24 mg/dL 3.21(H) 2.97(H) 2.89(H)  Sodium 135 - 145 mmol/L 140 139 138  Potassium 3.5 - 5.1 mmol/L 4.3 3.7 3.9  Chloride 98 - 111 mmol/L 111 108 106  CO2 22 - 32 mmol/L 18(L) 24 22  Calcium 8.9 - 10.3 mg/dL 9.1 9.4 9.5  Total Protein 6.5 - 8.1 g/dL - - -  Total Bilirubin 0.3 - 1.2 mg/dL - - -  Alkaline Phos 38 - 126 U/L - - -  AST 15 - 41 U/L - - -  ALT 0 - 44 U/L - - -   Vitamin D 45.9   Discharge Instructions: Discharge Instructions    Discharge instructions   Complete by:  As directed    It was a pleasure taking care of [name] during his hospital stay!  1. He was admitted for one episode of blood in his stool which appears to be self-limited. His hemoglobin has been stable and he has had no further episodes of blood in the stool since arrival to the hospital. He is medically appropriate for discharge home with his home health team.  2. The best management for his GI bleed is a good bowel regimen with laxatives and stool softeners. He already takes Miralax and Senna at home. I have also added Docusate to his medication list.   3. He was gently hydrated during his hospitalization with stable BUN and Cr consistent with chronic kidney disease.  4. His increased urine output was also investigated with a urinalysis (which was unremarkable) and a urine osmolality. We will call with the results of the urine osmolality.    5. He should follow up with his PCP. Please see a doctor sooner if he has further episodes of bleeding in the stool, alterations in his baseline mental status, or fainting.   Feel free to call our clinic at [phone] with any questions.  Thanks, Dr. Annie Paras      Signed: Mailyn Steichen, Andree Elk, MD 05/13/2018, 11:31 AM   Pager: [phone]

## 2018-05-13 NOTE — Progress Notes (Signed)
Internal Medicine Attending:   I saw and examined the patient. I reviewed the resident's note and I agree with the resident's findings and plan as documented in the resident's note.  No further bleeding, hemoglobin stable, renal function stable.  On examination patient no distress, lungs clear to auscultation abdomen minor grimacing with palpation of right upper quadrant otherwise unremarkable.  Discussed hospitalization and findings with patient's daughter reviewed lab test with her and answered questions.  Specifically she was concerned today about his frequent urination is been ongoing chronic problem he did receive a urinalysis which was notable for minimal glucosuria but otherwise no reason for polyuria.  Overall urine loss not too excessive.  However we discussed the findings and urinalysis including normal specific Gravity.  I did obtain a urine osmolality which is normal at 303.  Overall daughter expressed she was happy with the care father received and she agrees with discharge home today.

## 2018-05-13 NOTE — Progress Notes (Signed)
   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 He received the Geodon and rested more comfortably after. No further episodes of hematochezia. The patient's daughter continues to be concerned about his large urine output, which is a problem that she has noticed began over the past 2 weeks.   Objective:  Vital signs in last 24 hours: Vitals:   05/11/18 1337 05/12/18 0619 05/12/18 1331 05/12/18 2154  BP: (!) 150/65  139/66 (!) 133/57  Pulse: 76 67 60 63  Resp: 16 18 (!) 22 18  Temp: 98.4 F (36.9 C) 97.6 F (36.4 C) 98.7 F (37.1 C) 98.5 F (36.9 C)  TempSrc: Oral Oral Oral Oral  SpO2: 100% 100% 100% 100%   Physical exam Constitutional: Sleeping comfortably in bed.  Cardiovascular:Normal rateand regular rhythm. No murmurs, rubs, or gallops. Pulmonary/Chest:Effort normal. Clear to auscultation bilaterally. No wheezes, rales, or rhonchi. Ext: Trace lower extremity edema.  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 Hb on arrival was 9.4 (down from 10.6 three months prior).  Lower GI bleed - Hb 8.4 today. This minor decrease in one gram during his admission is expected given the dilutional effects of the IV fluids he has received. No further episodes of hematochezia or melena since admission. Most likely diverticular bleed given evidence of  diverticulosis on recent CT abdomen. GI recommends conservative management and avoidance endoscopy. Patient is stable for discharge home today.  Plan - Discharge home with return precautions of hematochezia or melena  Polyuria - The patient's daughter has noted her dad's Depends have been heavy in the recent weeks and that he seems to be urinating a lot while in the hospital with the condom cath. She is concerned that he will become dehydrated due to the polyuria. He has put out around 2L per day while hospitalized. This seems to be an appropriate amount of urine for this patient given his mild hyperglycemia (sugars have ranged from 119-214) and CKD. He has not taken a diuretic in weeks and is on no other medications to account for his urine volume. He has been normonatremic, so less likely to have DI. Further workup of this problem can be continued in the outpatient setting. Plan - F/u UA  Hypertension - Normotensive today. Plan -Continuing homeBidiland amlodipine. -Resumed home atenolol yesterday  Advanced dementia - Patient is very hard of hearing and oriented to self only. Is completely dependent on his wife for all ADLs. - Overnight he received a PRN dose of Geodon for restlessness. Patient appeared comfortable this morning. Plan - Continuing home dose of Seroquel, Depakote and Zoloft.  Dispo: Anticipated discharge today.  [name], Paul Elk, MD 05/13/2018, 6:55 AM Pager: [phone]

## 2018-05-13 NOTE — Progress Notes (Signed)
Pt discharged home with family in stable condition after going over discharge teaching with no concerns voiced. AVS given before leaving unit

## 2018-05-16 ENCOUNTER — Inpatient Hospital Stay (HOSPITAL_COMMUNITY): Payer: Medicare Other

## 2018-05-16 ENCOUNTER — Encounter (HOSPITAL_COMMUNITY): Payer: Self-pay

## 2018-05-16 ENCOUNTER — Inpatient Hospital Stay (HOSPITAL_COMMUNITY)
Admission: EM | Admit: 2018-05-16 | Discharge: 2018-05-27 | DRG: 377 | Disposition: A | Discharge status: Home / Self Care | Payer: Medicare Other | Attending: Internal Medicine | Admitting: Internal Medicine

## 2018-05-16 ENCOUNTER — Other Ambulatory Visit: Payer: Self-pay

## 2018-05-16 DIAGNOSIS — Z950 Presence of cardiac pacemaker: Secondary | ICD-10-CM | POA: Diagnosis not present

## 2018-05-16 DIAGNOSIS — Z8249 Family history of ischemic heart disease and other diseases of the circulatory system: Secondary | ICD-10-CM

## 2018-05-16 DIAGNOSIS — Z86718 Personal history of other venous thrombosis and embolism: Secondary | ICD-10-CM

## 2018-05-16 DIAGNOSIS — Z951 Presence of aortocoronary bypass graft: Secondary | ICD-10-CM

## 2018-05-16 DIAGNOSIS — K5520 Angiodysplasia of colon without hemorrhage: Secondary | ICD-10-CM | POA: Diagnosis present

## 2018-05-16 DIAGNOSIS — D62 Acute posthemorrhagic anemia: Secondary | ICD-10-CM | POA: Diagnosis present

## 2018-05-16 DIAGNOSIS — Z823 Family history of stroke: Secondary | ICD-10-CM | POA: Diagnosis not present

## 2018-05-16 DIAGNOSIS — K5731 Diverticulosis of large intestine without perforation or abscess with bleeding: Secondary | ICD-10-CM | POA: Diagnosis not present

## 2018-05-16 DIAGNOSIS — R358 Other polyuria: Secondary | ICD-10-CM | POA: Diagnosis present

## 2018-05-16 DIAGNOSIS — R0602 Shortness of breath: Secondary | ICD-10-CM

## 2018-05-16 DIAGNOSIS — I5023 Acute on chronic systolic (congestive) heart failure: Secondary | ICD-10-CM | POA: Diagnosis not present

## 2018-05-16 DIAGNOSIS — N184 Chronic kidney disease, stage 4 (severe): Secondary | ICD-10-CM | POA: Diagnosis not present

## 2018-05-16 DIAGNOSIS — Z833 Family history of diabetes mellitus: Secondary | ICD-10-CM | POA: Diagnosis not present

## 2018-05-16 DIAGNOSIS — F0391 Unspecified dementia with behavioral disturbance: Secondary | ICD-10-CM | POA: Diagnosis not present

## 2018-05-16 DIAGNOSIS — K5733 Diverticulitis of large intestine without perforation or abscess with bleeding: Principal | ICD-10-CM | POA: Diagnosis present

## 2018-05-16 DIAGNOSIS — I2510 Atherosclerotic heart disease of native coronary artery without angina pectoris: Secondary | ICD-10-CM

## 2018-05-16 DIAGNOSIS — K922 Gastrointestinal hemorrhage, unspecified: Secondary | ICD-10-CM | POA: Diagnosis present

## 2018-05-16 DIAGNOSIS — E1122 Type 2 diabetes mellitus with diabetic chronic kidney disease: Secondary | ICD-10-CM | POA: Diagnosis present

## 2018-05-16 DIAGNOSIS — F0390 Unspecified dementia without behavioral disturbance: Secondary | ICD-10-CM | POA: Diagnosis present

## 2018-05-16 DIAGNOSIS — I130 Hypertensive heart and chronic kidney disease with heart failure and stage 1 through stage 4 chronic kidney disease, or unspecified chronic kidney disease: Secondary | ICD-10-CM | POA: Diagnosis present

## 2018-05-16 DIAGNOSIS — Z79899 Other long term (current) drug therapy: Secondary | ICD-10-CM

## 2018-05-16 DIAGNOSIS — Z794 Long term (current) use of insulin: Secondary | ICD-10-CM

## 2018-05-16 DIAGNOSIS — Z888 Allergy status to other drugs, medicaments and biological substances status: Secondary | ICD-10-CM

## 2018-05-16 DIAGNOSIS — D649 Anemia, unspecified: Secondary | ICD-10-CM

## 2018-05-16 DIAGNOSIS — N179 Acute kidney failure, unspecified: Secondary | ICD-10-CM | POA: Diagnosis not present

## 2018-05-16 DIAGNOSIS — I10 Essential (primary) hypertension: Secondary | ICD-10-CM | POA: Diagnosis not present

## 2018-05-16 DIAGNOSIS — Z8679 Personal history of other diseases of the circulatory system: Secondary | ICD-10-CM | POA: Diagnosis not present

## 2018-05-16 DIAGNOSIS — Z66 Do not resuscitate: Secondary | ICD-10-CM | POA: Diagnosis not present

## 2018-05-16 DIAGNOSIS — F0151 Vascular dementia with behavioral disturbance: Secondary | ICD-10-CM

## 2018-05-16 DIAGNOSIS — Z8546 Personal history of malignant neoplasm of prostate: Secondary | ICD-10-CM | POA: Diagnosis not present

## 2018-05-16 DIAGNOSIS — I5043 Acute on chronic combined systolic (congestive) and diastolic (congestive) heart failure: Secondary | ICD-10-CM | POA: Diagnosis not present

## 2018-05-16 DIAGNOSIS — D631 Anemia in chronic kidney disease: Secondary | ICD-10-CM | POA: Diagnosis not present

## 2018-05-16 DIAGNOSIS — I110 Hypertensive heart disease with heart failure: Secondary | ICD-10-CM | POA: Diagnosis not present

## 2018-05-16 DIAGNOSIS — E119 Type 2 diabetes mellitus without complications: Secondary | ICD-10-CM

## 2018-05-16 DIAGNOSIS — R451 Restlessness and agitation: Secondary | ICD-10-CM | POA: Diagnosis not present

## 2018-05-16 DIAGNOSIS — Z886 Allergy status to analgesic agent status: Secondary | ICD-10-CM

## 2018-05-16 DIAGNOSIS — I5020 Unspecified systolic (congestive) heart failure: Secondary | ICD-10-CM | POA: Diagnosis not present

## 2018-05-16 DIAGNOSIS — I5042 Chronic combined systolic (congestive) and diastolic (congestive) heart failure: Secondary | ICD-10-CM | POA: Diagnosis present

## 2018-05-16 DIAGNOSIS — H9190 Unspecified hearing loss, unspecified ear: Secondary | ICD-10-CM | POA: Diagnosis not present

## 2018-05-16 HISTORY — DX: Diverticulitis of intestine, part unspecified, without perforation or abscess without bleeding: K57.92

## 2018-05-16 LAB — COMPREHENSIVE METABOLIC PANEL
ALK PHOS: 49 U/L (ref 38–126)
ALT: 13 U/L (ref 0–44)
AST: 15 U/L (ref 15–41)
Albumin: 3.2 g/dL — ABNORMAL LOW (ref 3.5–5.0)
Anion gap: 10 (ref 5–15)
BILIRUBIN TOTAL: 0.4 mg/dL (ref 0.3–1.2)
BUN: 79 mg/dL — AB (ref 8–23)
CO2: 20 mmol/L — ABNORMAL LOW (ref 22–32)
CREATININE: 3.36 mg/dL — AB (ref 0.61–1.24)
Calcium: 9.4 mg/dL (ref 8.9–10.3)
Chloride: 110 mmol/L (ref 98–111)
GFR calc Af Amer: 17 mL/min — ABNORMAL LOW (ref >60)
GFR, EST NON AFRICAN AMERICAN: 15 mL/min — AB (ref >60)
Glucose, Bld: 347 mg/dL — ABNORMAL HIGH (ref 70–99)
POTASSIUM: 4.3 mmol/L (ref 3.5–5.1)
Sodium: 140 mmol/L (ref 135–145)
TOTAL PROTEIN: 5.8 g/dL — AB (ref 6.5–8.1)

## 2018-05-16 LAB — CBC
HEMATOCRIT: 26.5 % — AB (ref 39.0–52.0)
HEMOGLOBIN: 9.2 g/dL — AB (ref 13.0–17.0)
MCH: 30.5 pg (ref 26.0–34.0)
MCHC: 34.7 g/dL (ref 30.0–36.0)
MCV: 87.7 fL (ref 80.0–100.0)
Platelets: 138 10*3/uL — ABNORMAL LOW (ref 150–400)
RBC: 3.02 MIL/uL — AB (ref 4.22–5.81)
RDW: 13.4 % (ref 11.5–15.5)
WBC: 7.8 10*3/uL (ref 4.0–10.5)
nRBC: 0.3 % — ABNORMAL HIGH (ref 0.0–0.2)

## 2018-05-16 LAB — CBC WITH DIFFERENTIAL/PLATELET
Abs Immature Granulocytes: 0.01 10*3/uL (ref 0.00–0.07)
BASOS PCT: 0 %
Basophils Absolute: 0 10*3/uL (ref 0.0–0.1)
Eosinophils Absolute: 0 10*3/uL (ref 0.0–0.5)
Eosinophils Relative: 0 %
HCT: 19.5 % — ABNORMAL LOW (ref 39.0–52.0)
Hemoglobin: 6.4 g/dL — CL (ref 13.0–17.0)
Immature Granulocytes: 0 %
Lymphocytes Relative: 14 %
Lymphs Abs: 0.7 10*3/uL (ref 0.7–4.0)
MCH: 30.3 pg (ref 26.0–34.0)
MCHC: 32.8 g/dL (ref 30.0–36.0)
MCV: 92.4 fL (ref 80.0–100.0)
MONO ABS: 0.4 10*3/uL (ref 0.1–1.0)
MONOS PCT: 8 %
Neutro Abs: 3.8 10*3/uL (ref 1.7–7.7)
Neutrophils Relative %: 78 %
PLATELETS: 143 10*3/uL — AB (ref 150–400)
RBC: 2.11 MIL/uL — AB (ref 4.22–5.81)
RDW: 13.3 % (ref 11.5–15.5)
WBC: 4.9 10*3/uL (ref 4.0–10.5)
nRBC: 0 % (ref 0.0–0.2)

## 2018-05-16 LAB — I-STAT CHEM 8, ED
BUN: 82 mg/dL — ABNORMAL HIGH (ref 8–23)
CALCIUM ION: 1.24 mmol/L (ref 1.15–1.40)
Chloride: 110 mmol/L (ref 98–111)
Creatinine, Ser: 3.7 mg/dL — ABNORMAL HIGH (ref 0.61–1.24)
GLUCOSE: 337 mg/dL — AB (ref 70–99)
HCT: 18 % — ABNORMAL LOW (ref 39.0–52.0)
Hemoglobin: 6.1 g/dL — CL (ref 13.0–17.0)
Potassium: 4.4 mmol/L (ref 3.5–5.1)
Sodium: 141 mmol/L (ref 135–145)
TCO2: 24 mmol/L (ref 22–32)

## 2018-05-16 LAB — GLUCOSE, CAPILLARY
GLUCOSE-CAPILLARY: 234 mg/dL — AB (ref 70–99)
Glucose-Capillary: 199 mg/dL — ABNORMAL HIGH (ref 70–99)
Glucose-Capillary: 227 mg/dL — ABNORMAL HIGH (ref 70–99)
Glucose-Capillary: 187 mg/dL — ABNORMAL HIGH (ref 70–99)

## 2018-05-16 LAB — PROTIME-INR
INR: 1.16
PROTHROMBIN TIME: 14.7 s (ref 11.4–15.2)

## 2018-05-16 LAB — PREPARE RBC (CROSSMATCH)

## 2018-05-16 LAB — APTT: aPTT: 22 seconds — ABNORMAL LOW (ref 24–36)

## 2018-05-16 LAB — POC OCCULT BLOOD, ED: Fecal Occult Bld: POSITIVE — AB

## 2018-05-16 MED ORDER — TECHNETIUM TC 99M-LABELED RED BLOOD CELLS IV KIT
25.0000 | PACK | Freq: Once | INTRAVENOUS | Status: AC | PRN
  Administered 2018-05-16: 25 via INTRAVENOUS

## 2018-05-16 MED ORDER — DIVALPROEX SODIUM 125 MG PO CSDR
125.0000 mg | DELAYED_RELEASE_CAPSULE | Freq: Two times a day (BID) | ORAL | Status: DC
  Filled 2018-05-16: qty 1

## 2018-05-16 MED ORDER — SODIUM CHLORIDE 0.9 % IV SOLN: INTRAVENOUS | Status: AC

## 2018-05-16 MED ORDER — INSULIN GLARGINE 100 UNIT/ML ~~LOC~~ SOLN
8.0000 [IU] | Freq: Every day | SUBCUTANEOUS | Status: DC
  Administered 2018-05-16 – 2018-05-23 (×8): 8 [IU] via SUBCUTANEOUS
  Filled 2018-05-16 (×10): qty 0.08

## 2018-05-16 MED ORDER — SODIUM CHLORIDE 0.9 % IV SOLN: 10.0000 mL/h | Freq: Once | INTRAVENOUS | Status: DC

## 2018-05-16 MED ORDER — INSULIN ASPART 100 UNIT/ML ~~LOC~~ SOLN
5.0000 [IU] | Freq: Once | SUBCUTANEOUS | Status: AC
  Administered 2018-05-16: 5 [IU] via SUBCUTANEOUS
  Filled 2018-05-16: qty 1

## 2018-05-16 MED ORDER — ACETAMINOPHEN 325 MG PO TABS: 650.0000 mg | ORAL_TABLET | Freq: Four times a day (QID) | ORAL | Status: DC | PRN

## 2018-05-16 MED ORDER — PANTOPRAZOLE SODIUM 40 MG IV SOLR
40.0000 mg | Freq: Two times a day (BID) | INTRAVENOUS | Status: DC
  Administered 2018-05-16 – 2018-05-23 (×15): 40 mg via INTRAVENOUS
  Filled 2018-05-16 (×17): qty 40

## 2018-05-16 MED ORDER — QUETIAPINE FUMARATE 50 MG PO TABS
50.0000 mg | ORAL_TABLET | Freq: Every day | ORAL | Status: DC
  Administered 2018-05-16 – 2018-05-26 (×11): 50 mg via ORAL
  Filled 2018-05-16 (×2): qty 1
  Filled 2018-05-16: qty 2
  Filled 2018-05-16: qty 1
  Filled 2018-05-16: qty 2
  Filled 2018-05-16 (×6): qty 1
  Filled 2018-05-16: qty 2

## 2018-05-16 MED ORDER — SODIUM CHLORIDE 0.9 % IV SOLN
INTRAVENOUS | Status: AC
  Administered 2018-05-16: 19:00:00 via INTRAVENOUS

## 2018-05-16 MED ORDER — INSULIN ASPART 100 UNIT/ML ~~LOC~~ SOLN
0.0000 [IU] | Freq: Three times a day (TID) | SUBCUTANEOUS | Status: DC
  Administered 2018-05-16: 5 [IU] via SUBCUTANEOUS
  Administered 2018-05-17 – 2018-05-18 (×2): 3 [IU] via SUBCUTANEOUS
  Administered 2018-05-18 – 2018-05-19 (×2): 2 [IU] via SUBCUTANEOUS
  Administered 2018-05-20 – 2018-05-22 (×3): 3 [IU] via SUBCUTANEOUS
  Administered 2018-05-23: 5 [IU] via SUBCUTANEOUS
  Administered 2018-05-23: 3 [IU] via SUBCUTANEOUS
  Administered 2018-05-24 (×2): 5 [IU] via SUBCUTANEOUS
  Administered 2018-05-24: 7 [IU] via SUBCUTANEOUS
  Administered 2018-05-25 (×2): 3 [IU] via SUBCUTANEOUS
  Administered 2018-05-26: 5 [IU] via SUBCUTANEOUS

## 2018-05-16 MED ORDER — DIVALPROEX SODIUM 125 MG PO CSDR
125.0000 mg | DELAYED_RELEASE_CAPSULE | Freq: Every morning | ORAL | Status: DC
  Administered 2018-05-17 – 2018-05-27 (×11): 125 mg via ORAL
  Filled 2018-05-16 (×11): qty 1

## 2018-05-16 MED ORDER — ACETAMINOPHEN 650 MG RE SUPP: 650.0000 mg | Freq: Four times a day (QID) | RECTAL | Status: DC | PRN

## 2018-05-16 MED ORDER — SERTRALINE HCL 50 MG PO TABS
25.0000 mg | ORAL_TABLET | Freq: Every day | ORAL | Status: DC
  Filled 2018-05-16: qty 1

## 2018-05-16 NOTE — Plan of Care (Signed)
Patient down getting tagged RBC study when I went to see patient.  His daughter, an ED physician by training, was in room and we reviewed case.  Patient has had brown stool admixed with scant red blood after hospital discharge last week.  However, had more darker stools and some "currant jelly" stools over the past couple days; Hgb has dropped to 6.  We reviewed scenarios:  If tagged RBC study positive and in stomach/small bowel, would consider endoscopy tomorrow once patient has been better volume repleted.  If study is positive for colon location, this will be difficult as patient has chronic renal insufficiency; may end up either watching supportively or consideration of colonoscopy (which is unlikely to find/treat colonic bleeding source and is certainly not without risks in patients of his age/comorbidities, but may be our only option).  If study is negative and bleeding persists, would consider PPI and supportive therapy versus endoscopy/sigmoidoscopy.  Another of patient's daughter, a gastroenterologist, is en route from [place] now.    Hopefully later today we can help better clarify best course of action.

## 2018-05-16 NOTE — ED Provider Notes (Signed)
TIME SEEN: 4:30 AM  CHIEF COMPLAINT: GI bleed  HPI: Is a 84 year old male with history of chronic kidney disease, CHF, dementia, hypertension, diabetes who presents to the emergency department with maroon-colored stools for the past day.  Was just admitted to the hospital for GI bleed and thought to have a diverticular bleed.  Bleeding stopped without intervention.  His hemoglobin dropped but he did not require blood transfusion.  No fever.  No vomiting.  No complaints of abdominal pain.  Daughter at bedside is an ER physician.  Patient is a DNR.  ROS: Level 5 caveat due to dementia  PAST MEDICAL HISTORY/PAST SURGICAL HISTORY:  Past Medical History:  Diagnosis Date  . CAD (coronary artery disease)    a. CABG 1991 and 2006 LIMA LAD, seq. SVG D1-Cfx, SVG d2,and seq. SVG AM-PDA   . Chronic systolic CHF (congestive heart failure) ([place])   . CKD (chronic kidney disease)   . Dementia ([place])   . Depression   . Diabetes mellitus   . GI bleeding 05/11/2018  . HTN (hypertension)   . Left leg DVT ([place])   . Presence of permanent cardiac pacemaker   . Prostate cancer ([place])   . Tachycardia-bradycardia syndrome ([place])     MEDICATIONS:  Prior to Admission medications   Medication Sig Start Date End Date Taking? Authorizing Provider  acetaminophen (TYLENOL) 500 MG tablet Take 500 mg by mouth every 6 (six) hours as needed.    [provider]  amLODipine (NORVASC) 10 MG tablet Take 10 mg by mouth daily. 01/22/18   [provider]  atenolol (TENORMIN) 50 MG tablet Take 50 mg by mouth 2 (two) times daily.     [provider]  CALCIUM PO Take 600 mg by mouth 2 (two) times daily.     [provider]  Coenzyme Q10 (COQ10 PO) Take 1 capsule by mouth daily.    [provider]  divalproex (DEPAKOTE SPRINKLE) 125 MG capsule Take 125 mg by mouth 2 (two) times daily. 02/21/18   [provider]  docusate sodium (COLACE) 100 MG capsule Take 2 capsules (200 mg  total) by mouth 2 (two) times daily. 05/13/18   Dorrell, Andree Elk, MD  ezetimibe (ZETIA) 10 MG tablet Take 1 tablet (10 mg total) by mouth daily. Patient not taking: Reported on 05/11/2018 10/22/17   Florencia Reasons, MD  feeding supplement, GLUCERNA SHAKE, (GLUCERNA SHAKE) LIQD Take 237 mLs by mouth 3 (three) times daily as needed (feeding supplement).    [provider]  furosemide (LASIX) 20 MG tablet Take 1 tablet (20 mg total) by mouth daily as needed. 10/22/17   Florencia Reasons, MD  Glucose Blood (FREESTYLE LITE TEST VI) USE AS DIRECTED 06/02/13   [provider]  insulin glargine (LANTUS) 100 UNIT/ML injection Inject 6-18 Units into the skin as needed. Sliding scale     [provider]  IRON PO Take 1 tablet by mouth daily.    [provider]  isosorbide-hydrALAZINE (BIDIL) 20-37.5 MG tablet Take 1 tablet by mouth 3 (three) times daily. 11/15/15   Rivet, Sindy Guadeloupe, MD  linagliptin (TRADJENTA) 5 MG TABS tablet Take 7.5 mg by mouth daily.    [provider]  Multiple Vitamins-Minerals (ICAPS PO) Take 1 tablet by mouth 2 (two) times daily.    [provider]  Multiple Vitamins-Minerals (MULTIVITAMIN ADULT PO) Take 1 capsule by mouth daily.    [provider]  polyethylene glycol (MIRALAX / GLYCOLAX) packet Take 17 g by  mouth daily as needed for moderate constipation.    [provider]  Probiotic Product (ALIGN PO) Take 1 capsule by mouth daily.    [provider]  QUEtiapine (SEROQUEL) 25 MG tablet Take 0.5 tablets (12.5 mg total) by mouth at bedtime. Patient taking differently: Take 50 mg by mouth at bedtime.  02/28/18   Georgette Shell, MD  SENNA PO Take 8.6 mg by mouth daily.     [provider]  sertraline (ZOLOFT) 25 MG tablet Take 25 mg by mouth daily.     [provider]    ALLERGIES:  Allergies  Allergen Reactions  . Iodides Anaphylaxis and Other (See Comments)  . Iodinated Diagnostic Agents  Anaphylaxis  . Iohexol Anaphylaxis     Desc: RN states pt stated he had anyphalactic shock reaction to contrast approx 10 years ago.   . Other Shortness Of Breath  . Ace Inhibitors Other (See Comments)    cough  . Simvastatin Other (See Comments)    Muscle aches  . Aspirin     Other reaction(s): Abdominal Pain    SOCIAL HISTORY:  Social History   Tobacco Use  . Smoking status: Never Smoker  . Smokeless tobacco: Never Used  Substance Use Topics  . Alcohol use: No    Alcohol/week: 0.0 standard drinks    FAMILY HISTORY: Family History  Problem Relation Age of Onset  . Diabetes Mother   . Stroke Mother   . Heart disease Sister   . Heart disease Sister   . Heart disease Sister   . Heart disease Sister     EXAM: BP (!) 141/67 (BP Location: Right Arm)   Pulse 77   Temp 98.4 F (36.9 C) (Rectal)   Resp 13   SpO2 98%  CONSTITUTIONAL: Early, demented, chronically ill-appearing HEAD: Normocephalic EYES: Conjunctivae clear, pupils appear equal, EOMI ENT: normal nose; moist mucous membranes NECK: Supple, no meningismus, no nuchal rigidity, no LAD  CARD: RRR; S1 and S2 appreciated; no murmurs, no clicks, no rubs, no gallops RESP: Normal chest excursion without splinting or tachypnea; breath sounds clear and equal bilaterally; no wheezes, no rhonchi, no rales, no hypoxia or respiratory distress, speaking full sentences ABD/GI: Normal bowel sounds; non-distended; soft, non-tender, no rebound, no guarding, no peritoneal signs, no hepatosplenomegaly RECTAL: No hemorrhoids, maroon-colored blood on examination with brown-colored stool, no melena, no fecal impaction BACK:  The back appears normal and is non-tender to palpation, there is no CVA tenderness EXT: Normal ROM in all joints; non-tender to palpation; no edema; normal capillary refill; no cyanosis, no calf tenderness or swelling    SKIN: Normal color for age and race; warm; no rash NEURO: Moves all extremities  equally   MEDICAL DECISION MAKING: Patient here with GI bleed.  Hemoglobin is 6.1.  He is hemodynamically stable.  I have ordered 2 units of packed red blood cells.  Abdominal exam benign.  Suspect diverticular bleed.  Patient will need admission.  ED PROGRESS: 5:52 AM Discussed patient's case with IM resident.  I have recommended admission and patient (and family if present) agree with this plan. Admitting physician will place admission orders.   I reviewed all nursing notes, vitals, pertinent previous records, EKGs, lab and urine results, imaging (as available).       EKG Interpretation  Date/Time:  Tuesday May 16 2018 04:08:03 EDT Ventricular Rate:  69 PR Interval:    QRS Duration: 178 QT Interval:  494 QTC Calculation: 530 R Axis:   -82  Text Interpretation:  Ventricular-paced rhythm No further analysis attempted due to paced rhythm No significant change since last tracing Confirmed by Edras Wilford, Cyril Mourning [phone]) on 05/16/2018 4:12:50 AM        CRITICAL CARE Performed by: Cyril Mourning Jovontae Banko   Total critical care time: 45 minutes  Critical care time was exclusive of separately billable procedures and treating other patients.  Critical care was necessary to treat or prevent imminent or life-threatening deterioration.  Critical care was time spent personally by me on the following activities: development of treatment plan with patient and/or surrogate as well as nursing, discussions with consultants, evaluation of patient's response to treatment, examination of patient, obtaining history from patient or surrogate, ordering and performing treatments and interventions, ordering and review of laboratory studies, ordering and review of radiographic studies, pulse oximetry and re-evaluation of patient's condition.    Dray Dente, Delice Bison, DO 05/16/18 [phone]

## 2018-05-16 NOTE — Progress Notes (Signed)
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 He was accompanied by his wife. His wife reports that the patient seems to be more comfortable now than he was before coming to the hospital. He has had no stools since arriving. The patient himself opens his eyes in response to his name, but does not answer questions or follow commands.  Objective:  Vital signs in last 24 hours: Vitals:   05/16/18 0408 05/16/18 0409 05/16/18 0422 05/16/18 0530  BP: (!) 141/67   133/61  Pulse: 77   80  Resp: 13   (!) 22  Temp:   98.4 F (36.9 C)   TempSrc:   Rectal   SpO2: 100% 98%  100%   Physical Exam Constitutional: Lying comfortably in bed. Does not respond to questions or follow commands. Eyes: Pupils are constricted. EOM are normal.  Cardiovascular: Normal rate and regular rhythm. II/VI systolic murmur. Pulmonary/Chest: Effort normal. Clear to auscultation bilaterally. No wheezes, rales, or rhonchi. Abdominal: Bowel sounds present. Soft, non-distended, non-tender. Ext: No lower extremity edema. Skin: Warm and dry. No rashes or wounds.  Assessment/Plan:  Active Problems:   GI bleed  Paul Casco Edwardsis a 84 y.o.malewithmedical historyof CAD, CKD, DM, HTN,combined systolic and diastolic heart failure, tachybrady syndromes/pdual-chamber pacemaker,prostate cancer, and dementia who presented with melena and Hb of 6.4 (Hb 8.4 three days prior to admission). He was discharged 10/12 for one episode of bright red blood per rectum thought to be caused by a diverticular bleed. He was discharged after 48 hours of stable Hb without further hematochezia. During that admission, he was evaluated by GI who recommended initial conservative management with stool softeners and laxatives and tagged RBC scan if re-bleed.  Acute Anemia 2/2 GI Bleed - Hb on admission 6.4. Two units of RBCs ordered for transfusion.  - Currently HDS - Per GI recommendations on last  admission, will pursue tagged RBC scan to identify source of bleeding. If bleeding artery is identified, will consider IR consult for embolization.  Plan - GI consulted, appreciate recs - f/u post-transfusion CBC  - Gentle hydration with 100cc/hr NS for 5 hours - Tagged RBC scan  Dementia - At his baseline, the patient is very hard of hearing and oriented only to self. - During his last admission, he became agitated during the nights and was given geodon per his daughter's request.  - Daughter reports he has not been taking his home sertraline, so will hold that medication. Plan - Continue home seroquel 50mg  at 5:30pm and depakote  HTN - Holding home amlodipine and atenolol in the setting of GIB.   Hx of HFrEF - Last echo on 09/2016 show EF of 30-35% Plan - Monitor for hypervolemia in setting of fluid and blood infusions  DMII - A1c 7.3 01/2018. Home regimen includes Lantus sliding scale at bedtime and linagliptin. Plan Plan - CBG - Lantus 8u qhs - SSI  Polyuria - The patient's daughter is concerned about her dad's increased urine output. During the last admission, his UA showed 50 glucose and trace leukocytes. Urine osmolality was 303 and not concerning for diabetes insipidus. He had no electrolyte abnormalities. Advised his daughter that the next step would be 24-hour urine with osmolality as an outpatient. Now that he is re-admitted, she would like to do the 24-hour urine during this admission.  Plan - Consider 24-hour urine study after transfusions and stabilization  Dispo: Anticipated discharge in approximately 2-3 days  [name],  Andree Elk, MD 05/16/2018, 6:50 AM Pager: [phone]

## 2018-05-16 NOTE — Progress Notes (Addendum)
Spoke with daughter Denson Niccoli at bedside to clarify code status. Patient's wife, Mrs. Hilary Pundt, who is POA was present as well. She mentions that he is not DNR but partial code with no compressions or intubation but okay for pressor support, AED and bipap/Nippv. Mrs. Mattie Brothers agrees. Will update code status.

## 2018-05-16 NOTE — Progress Notes (Signed)
Patient off of the floor at this time to NM. Will give second unit of blood when he arrives back to the floor.

## 2018-05-16 NOTE — Progress Notes (Signed)
Pt with large bloody (type 5) BM this PM. Post-transfusion hemoglobin and hematocrit recently drawn by phlebotomy. Pt in no distress at this time. MD paged. Will continue to monitor.

## 2018-05-16 NOTE — ED Triage Notes (Signed)
Pt coming from home. Pt was just discharged from the hospital last Thursday. Pt came in for rectal bleeding and was diagnosis with diverticulitis. Pt has been having small stools with bright red blood since he has been home and today he has had big melena stools with bright red blood as well.

## 2018-05-16 NOTE — H&P (Signed)
Date: 05/16/2018               Patient Name:  [name] MRN: [id number]  DOB: 04-18-1923 Age / Sex: 84 y.o., male   PCP: Paul Poll, MD         Medical Service: Internal Medicine Teaching Service         Attending Physician: [name], Paul Bison, DO    First Contact: [name] Pager: [phone]  Second Contact: [name] Pager: [phone]       After Hours (After 5p/  First Contact Pager: [phone]  weekends / holidays): Second Contact Pager: [phone]   Chief Complaint: Weakness  History of Present Illness: [name] is a 84 yo M w/ PMH of CAD, CKD, dementia, T2DM, HTN, HFrEF, Mobitz type 2 heart block s/p pacemaker, prostate cancer, and diverticulitis presenting to the ED with weakness. He was evaluated in ED with his wife and daughter present. [name] is minimally responsive and unable to describe his history at baseline due to his dementia. Most of the history was obtained from his daughter. He had a recent admission for hematochezia and was thought to be due to diverticulosis on abdominal CT. His bleeding stopped spontaneously and he received conservative treatment with stool softeners and laxatives and was discharged once his hemoglobin was found to be stable.   After discharge, he came home and appeared to be slightly sedated but comfortable. He was able to ambulate with walker to bathroom and back without having to rest. Daughter was holding his home blood pressure medications as his bp appeared lower than usual (146/62). She states that his care taker noted couple bowel movements over the weekend which were flushed before daughter was able to inspect. He was tolerating minimal diet due to presumed lack of appetite but he was getting some Glucerna and apple juice. As the weekend progressed, she states that he also appeared to be in pain as he appeared more distressed than usual. She also noted that he appeared more fatigued and weak as he could not  stay standing up with walker assistance on Sunday. Today she noted that he had change in quality of his stool although she mentions his stool was brown. Denies any fever, vomiting, other sites of bleeding, or cough, loss of consciousness, syncope, or seizures.  In the ED, he was found to have a hemoglobin of 6.4 and type and screen was performed. 2 units of pRBCs were ordered and IM was consulted for admission.  Meds:  No outpatient medications have been marked as taking for the 05/16/18 encounter [facility] Encounter).     Allergies: Allergies as of 05/16/2018 - Review Complete 05/16/2018  Allergen Reaction Noted  . Iodides Anaphylaxis and Other (See Comments) 10/07/2011  . Iodinated diagnostic agents Anaphylaxis 06/06/2014  . Iohexol Anaphylaxis 09/23/2009  . Other Shortness Of Breath 11/09/2014  . Ace inhibitors Other (See Comments) 06/06/2014  . Simvastatin Other (See Comments) 06/06/2014  . Aspirin  04/30/2015   Past Medical History:  Diagnosis Date  . CAD (coronary artery disease)    a. CABG 1991 and 2006 LIMA LAD, seq. SVG D1-Cfx, SVG d2,and seq. SVG AM-PDA   . Chronic systolic CHF (congestive heart failure) ([place])   . CKD (chronic kidney disease)   . Dementia ([place])   . Depression   . Diabetes mellitus   . GI bleeding 05/11/2018  . HTN (hypertension)   . Left leg DVT ([place])   . Presence of permanent cardiac  pacemaker   . Prostate cancer ([place])   . Tachycardia-bradycardia syndrome ([place])    Family History:  Mother had T2DM, died from CVA Sister had heart disease  Social History:  Married and lives with his wife, has a Neurosurgeon Denies any tobacco, EtOH, and illicit substance use  Review of Systems: A complete ROS was negative except as per HPI.  Physical Exam: Blood pressure 133/61, pulse 80, temperature 98.4 F (36.9 C), temperature source Rectal, resp. rate (!) 22, SpO2 100 %.  Physical Exam  Constitutional:  somnolent  HENT:  Head: Normocephalic and  atraumatic.  Mouth/Throat: Oropharynx is clear and moist.  Pale mucosal membrane  Eyes: Pupils are equal, round, and reactive to light. Conjunctivae are normal.  Neck: Normal range of motion. Neck supple. No JVD present.  Cardiovascular: Normal rate and intact distal pulses.  Murmur (systolic murmur at apex) heard. Pulmonary/Chest: Effort normal and breath sounds normal. He has no wheezes. He has no rales.  Abdominal: Soft. Bowel sounds are normal. He exhibits no distension. There is no tenderness. There is no guarding.  Musculoskeletal: Normal range of motion. He exhibits no edema, tenderness or deformity.  Neurological:  GCS 8, eye open to command, no verbal response, withdraws from pain  Skin: Skin is warm and dry. There is pallor.   EKG: personally reviewed my interpretation is rhythm paced by pacemaker, no significant change from prior  CXR: N/A  Assessment & Plan by Problem: [name] is a 84 yo M w/ PMH of  PMH of CAD, CKD, dementia, T2DM, HTN, HFrEF, Mobitz type 2 heart block s/p pacemaker, prostate cancer, and diverticulitis presenting to the ED with weakness. He was recently discharged after an episode of BRBPR with presumed bleeding diverticuli. While during last admission his hemoglobin was stable, he presented today with hgb of 6.4 (was 8.4 three days ago) and + FOBT. Sudden decline in his hemoglobin is concerning for acute lower GI bleed despite his lack of melena. We will admit for transfusions to stabilize his hemoglobin and will continue lower GI bleed work up with tagged RBC scan to see if we can identify the source of his bleed.  Lower GI bleed 2/2 diverticular hemorrhage Colonic diverticulosis on Abd CT 09/2017. FOBT + Admit Hgb 6.4. INR 1.16 Episode of BRBPR on prior admission. ED ordered 2 units of pRBC - F/u post transfusion H&H - Keep NPO - IV NS 100cc/hr for 5 hours - Tagged RBC scan - Consider IR consult for embolization of bleeding artery if identified  Hx of  HFrEF Last echo on 09/2016 show EF of 30-35% - Monitor for hypervolemia in setting of HFrEF getting fluids and blood transfusions  T2DM - glucose checks  - Sliding scale aspart - Lantus 8 units qhs  Dementia - c/w home meds: Sertraline 25mg  daily, depakote 125mg  BID  HTN - holding home bp med in setting of acute bleed  DVT prophx: SCDs Diet: NPO Bowel: N/A Code: DNR  Dispo: Admit patient to Inpatient with expected length of stay greater than 2 midnights.  Signed: Mosetta Anis, MD 05/16/2018, 6:26 AM  Pager: [phone]

## 2018-05-16 NOTE — Progress Notes (Signed)
Subjective: Returned from nuclear medicine.  Objective: Vital signs in last 24 hours: Temp:  [97.8 F (36.6 C)-98.7 F (37.1 C)] 98.3 F (36.8 C) (10/15 1532) Pulse Rate:  [64-81] 64 (10/15 1532) Resp:  [13-22] 16 (10/15 1532) BP: (126-157)/(59-79) 156/65 (10/15 1532) SpO2:  [98 %-100 %] 100 % (10/15 1532) Weight:  [63.8 kg] 63.8 kg (10/15 0823) Weight change:  Last BM Date: 05/16/18  PE:  HR 60s, SBP 150s GEN:  Sleeping in bed  Lab Results: CBC    Component Value Date/Time   WBC 4.9 05/16/2018 0442   RBC 2.11 (L) 05/16/2018 0442   HGB 6.1 (LL) 05/16/2018 0509   HCT 18.0 (L) 05/16/2018 0509   PLT 143 (L) 05/16/2018 0442   MCV 92.4 05/16/2018 0442   MCV 92.7 03/22/2017 1146   MCH 30.3 05/16/2018 0442   MCHC 32.8 05/16/2018 0442   RDW 13.3 05/16/2018 0442   LYMPHSABS 0.7 05/16/2018 0442   MONOABS 0.4 05/16/2018 0442   EOSABS 0.0 05/16/2018 0442   BASOSABS 0.0 05/16/2018 0442    Studies/Results: Nm Gi Blood Loss  Result Date: 05/16/2018 CLINICAL DATA:  GI bleed. EXAM: NUCLEAR MEDICINE GASTROINTESTINAL BLEEDING SCAN TECHNIQUE: Sequential abdominal images were obtained following intravenous administration of Tc-64m labeled red blood cells. RADIOPHARMACEUTICALS:  24.0 mCi Tc-62m pertechnetate in-vitro labeled red cells. COMPARISON:  CT 10/19/2017 FINDINGS: Following administration of radiopharmaceutical standard GI bleeding scan was obtained. Increased activity noted over the right and mid upper abdomen. This is consistent with a positive GI bleed. Is difficult to determine the site of bleeding. This could be colonic and or upper GI including duodenal. IMPRESSION: Positive study for GI bleed as above. Electronically Signed   By: Marcello Moores  Register   On: 05/16/2018 14:46   Assessment:  1.  Blood in stool.  Tagged RBC study positive, distal duodenum versus proximal colon (favored) 2.  Acute blood loss anemia.  Plan:  1.  PPI gtt. 2.  Serial CBCs; transfuse to goal Hgb ~  8. 3.  Volume repletion. 4.  Tentative plan for endoscopy tomorrow. 5.  Patient's daughters, both physicians, will convene later today to discuss and make final decision re: endoscopy. 6.  [facility] will follow.   Landry Dyke 05/16/2018, 3:42 PM   Cell [phone] If no answer or after 5 PM call [phone]

## 2018-05-17 ENCOUNTER — Encounter (HOSPITAL_COMMUNITY): Payer: Self-pay | Admitting: Gastroenterology

## 2018-05-17 ENCOUNTER — Encounter (HOSPITAL_COMMUNITY)
Admission: EM | Disposition: A | Discharge status: Home / Self Care | Payer: Self-pay | Source: Home / Self Care | Attending: Internal Medicine

## 2018-05-17 DIAGNOSIS — I495 Sick sinus syndrome: Secondary | ICD-10-CM

## 2018-05-17 DIAGNOSIS — Z79899 Other long term (current) drug therapy: Secondary | ICD-10-CM

## 2018-05-17 DIAGNOSIS — D62 Acute posthemorrhagic anemia: Secondary | ICD-10-CM

## 2018-05-17 DIAGNOSIS — N189 Chronic kidney disease, unspecified: Secondary | ICD-10-CM

## 2018-05-17 DIAGNOSIS — I5040 Unspecified combined systolic (congestive) and diastolic (congestive) heart failure: Secondary | ICD-10-CM

## 2018-05-17 DIAGNOSIS — I130 Hypertensive heart and chronic kidney disease with heart failure and stage 1 through stage 4 chronic kidney disease, or unspecified chronic kidney disease: Secondary | ICD-10-CM

## 2018-05-17 DIAGNOSIS — K922 Gastrointestinal hemorrhage, unspecified: Secondary | ICD-10-CM

## 2018-05-17 DIAGNOSIS — F0390 Unspecified dementia without behavioral disturbance: Secondary | ICD-10-CM

## 2018-05-17 DIAGNOSIS — Z9889 Other specified postprocedural states: Secondary | ICD-10-CM

## 2018-05-17 DIAGNOSIS — E1122 Type 2 diabetes mellitus with diabetic chronic kidney disease: Secondary | ICD-10-CM

## 2018-05-17 DIAGNOSIS — Z8546 Personal history of malignant neoplasm of prostate: Secondary | ICD-10-CM

## 2018-05-17 DIAGNOSIS — R358 Other polyuria: Secondary | ICD-10-CM

## 2018-05-17 HISTORY — PX: ESOPHAGOGASTRODUODENOSCOPY: SHX5428

## 2018-05-17 LAB — BASIC METABOLIC PANEL
ANION GAP: 10 (ref 5–15)
BUN: 71 mg/dL — ABNORMAL HIGH (ref 8–23)
CHLORIDE: 114 mmol/L — AB (ref 98–111)
CO2: 18 mmol/L — AB (ref 22–32)
Calcium: 8.8 mg/dL — ABNORMAL LOW (ref 8.9–10.3)
Creatinine, Ser: 3.12 mg/dL — ABNORMAL HIGH (ref 0.61–1.24)
GFR calc non Af Amer: 16 mL/min — ABNORMAL LOW (ref >60)
GFR, EST AFRICAN AMERICAN: 18 mL/min — AB (ref >60)
Glucose, Bld: 243 mg/dL — ABNORMAL HIGH (ref 70–99)
POTASSIUM: 3.9 mmol/L (ref 3.5–5.1)
Sodium: 142 mmol/L (ref 135–145)

## 2018-05-17 LAB — GLUCOSE, CAPILLARY
GLUCOSE-CAPILLARY: 219 mg/dL — AB (ref 70–99)
Glucose-Capillary: 212 mg/dL — ABNORMAL HIGH (ref 70–99)
Glucose-Capillary: 147 mg/dL — ABNORMAL HIGH (ref 70–99)

## 2018-05-17 LAB — HEMOGLOBIN AND HEMATOCRIT, BLOOD
HCT: 30.4 % — ABNORMAL LOW (ref 39.0–52.0)
Hemoglobin: 10.5 g/dL — ABNORMAL LOW (ref 13.0–17.0)

## 2018-05-17 LAB — CBC
HEMATOCRIT: 23.6 % — AB (ref 39.0–52.0)
HEMOGLOBIN: 7.7 g/dL — AB (ref 13.0–17.0)
MCH: 29.4 pg (ref 26.0–34.0)
MCHC: 32.6 g/dL (ref 30.0–36.0)
MCV: 90.1 fL (ref 80.0–100.0)
NRBC: 0 % (ref 0.0–0.2)
Platelets: 137 10*3/uL — ABNORMAL LOW (ref 150–400)
RBC: 2.62 MIL/uL — ABNORMAL LOW (ref 4.22–5.81)
RDW: 14.1 % (ref 11.5–15.5)
WBC: 6.7 10*3/uL (ref 4.0–10.5)

## 2018-05-17 LAB — PREPARE RBC (CROSSMATCH)

## 2018-05-17 SURGERY — EGD (ESOPHAGOGASTRODUODENOSCOPY)
Anesthesia: Topical | Laterality: Left

## 2018-05-17 MED ORDER — FENTANYL CITRATE (PF) 100 MCG/2ML IJ SOLN
INTRAMUSCULAR | Status: AC
  Filled 2018-05-17: qty 2

## 2018-05-17 MED ORDER — MIDAZOLAM HCL 5 MG/ML IJ SOLN
INTRAMUSCULAR | Status: AC
  Filled 2018-05-17: qty 2

## 2018-05-17 MED ORDER — SODIUM CHLORIDE 0.9% IV SOLUTION
Freq: Once | INTRAVENOUS | Status: AC
  Administered 2018-05-18: 05:00:00 via INTRAVENOUS

## 2018-05-17 MED ORDER — SODIUM CHLORIDE 0.9 % IV SOLN
INTRAVENOUS | Status: DC
  Administered 2018-05-17: 14:00:00 via INTRAVENOUS

## 2018-05-17 MED ORDER — SODIUM CHLORIDE 0.9 % IV SOLN
INTRAVENOUS | Status: DC
  Administered 2018-05-18: 05:00:00 via INTRAVENOUS

## 2018-05-17 MED ORDER — ZIPRASIDONE MESYLATE 20 MG IM SOLR
10.0000 mg | Freq: Once | INTRAMUSCULAR | Status: AC
  Administered 2018-05-18: 10 mg via INTRAMUSCULAR
  Filled 2018-05-17: qty 20

## 2018-05-17 MED ORDER — SODIUM CHLORIDE 0.9 % IV SOLN: INTRAVENOUS | Status: DC

## 2018-05-17 NOTE — Progress Notes (Signed)
Patient's family declined post H&H due to multiple lab draw attempts. MD aware.

## 2018-05-17 NOTE — Op Note (Signed)
[facility] Patient Name: [name] Procedure Date : 05/17/2018 MRN: [id number] Attending MD: Ronald Lobo , MD Date of Birth: 05/15/1923 CSN: [id number] Age: 84 Admit Type: Inpatient Procedure:                Upper GI endoscopy Indications:              Acute post hemorrhagic anemia, Hematochezia                            (recurrent), positive bleeding scan in RUQ (colonic                            vs gastro-duodenal), known diverticular disease Providers:                Ronald Lobo, MD, Angus Seller Tinnie Gens,                            Technician Referring MD:              Medicines:                Cetacaine spray Complications:            No immediate complications. Estimated Blood Loss:     Estimated blood loss: none. Procedure:                Pre-Anesthesia Assessment:                           - Prior to the procedure, a History and Physical                            was performed, and patient medications and                            allergies were reviewed. The patient's tolerance of                            previous anesthesia was also reviewed. The risks                            and benefits of the procedure and the sedation                            options and risks were discussed with the patient.                            All questions were answered, and informed consent                            was obtained. Prior Anticoagulants: The patient has                            taken no previous anticoagulant or antiplatelet                            agents. ASA Grade  Assessment: IV - A patient with                            severe systemic disease that is a constant threat                            to life. After reviewing the risks and benefits,                            the patient was deemed in satisfactory condition to                            undergo the procedure.                           After obtaining informed  consent, the endoscope was                            passed under direct vision. Throughout the                            procedure, the patient's blood pressure, pulse, and                            oxygen saturations were monitored continuously. The                            [id number]-[id number] ([id number]) Olympus Ultraslim EGD was                            introduced through the mouth, and advanced to the                            second part of duodenum. The upper GI endoscopy was                            accomplished without difficulty. The patient                            tolerated the procedure well. Scope In: Scope Out: Findings:      The examined esophagus was normal. No varices/Mallory Mariel Kansky       tear/esophagitis.      The entire examined stomach was normal. No mass/ulcer/erosion/vascular       ectasia.      The cardia and gastric fundus were normal on retroflexion.      The examined duodenum was normal. No ulcers or vascular ectasia.      No blood whatsoever up to the limit of the exam, felt to be the distal       second duodenum or possibly the proximal-to-mid portion of the third       duodenum. Impression:               - Normal esophagus.                           -  Normal stomach.                           - Normal examined duodenum.                           - No specimens collected.                           - No blood present.                           - No source of GI bleeding seen on this exam,                            making it likely that the source is                            colonic/diverticular. Recommendation:           - Continue present medications.                           - Continue supportive care for now.                           - Per d/w IR, there is the option of selective                            arteriography using limited contrast following                            anaphylaxis prophylaxis IF we are desperate. Procedure Code(s):         --- Professional ---                           [phone], Esophagogastroduodenoscopy, flexible,                            transoral; diagnostic, including collection of                            specimen(s) by brushing or washing, when performed                            (separate procedure) Diagnosis Code(s):        --- Professional ---                           K92.1, Melena (includes Hematochezia)                           D62, Acute posthemorrhagic anemia CPT copyright 2018 American Medical Association. All rights reserved. The codes documented in this report are preliminary and upon coder review may  be revised to meet current compliance requirements. Ronald Lobo, MD 05/17/2018 9:09:56 AM This report has been signed electronically. Number of Addenda: 0

## 2018-05-17 NOTE — Interval H&P Note (Signed)
History and Physical Interval Note:  05/17/2018 8:25 AM  [name]  has presented today for endoscopy, with the diagnosis of blood in stool, melena, anemia  The various methods of treatment have been discussed with the patient and family. After consideration of risks, benefits and other options for treatment, the patient has consented to  Procedure(s): ESOPHAGOGASTRODUODENOSCOPY (EGD) (Left) as an intervention .  The patient's history has been reviewed, patient examined, no change in status (had further bleeding last night, hgb 7.7 then received 1 unit of PRC's finishing about 6 am, stable for surgery.  I have reviewed the patient's chart and labs.  Questions were answered to the patient's family's satisfaction.     [name]

## 2018-05-17 NOTE — Progress Notes (Signed)
Patient's family declined CBG check this evening due to patient sleeping. Patient did become agitated earlier but is now calm and sleeping.

## 2018-05-17 NOTE — Progress Notes (Signed)
Pt with 3 more large bloody BMs during this shift. VS stable. MD paged. Will continue to monitor.

## 2018-05-17 NOTE — Progress Notes (Signed)
Unsedated upper endoscopy well-tolerated, and unrevealing for any source of bleeding.  Please see procedure report for details.  Review of patient's previous outpatient colonoscopy reports (4 exams, between 2008 and 2012) do not show significant diverticular change.  Two of the reports mention no diverticula at all, one mentions a solitary diverticulum in the ascending colon, and one mention "multiple" diverticula scattered in the colon.  This raises the possibility that the patient's bleeding is from an alternative source, such as vascular ectasia or even neoplasia, although statistically I still think this is most likely a diverticular bleed from the lower GI tract, although a small bowel source is conceivably possible.  In any event, I think observation and supportive care is the most prudent option for now.  If the patient shows signs of ongoing bleeding for days, we may have to rethink our strategy, perhaps doing a single column barium enema to look for diverticula or masses, or conceivably putting the patient through a prep and colonoscopy, although that would be a somewhat risky undertaking.  We will follow with you.  Cleotis Nipper, M.D. Pager [phone] If no answer or after 5 PM call [phone]

## 2018-05-17 NOTE — Progress Notes (Signed)
Subjective: Mr. Bedrosian was resting in bed.  He continues to have bloody bowel movements.  Her daughters were at bedside. He denies any pain at the moment. When we arrived daughters were very concerned about his low blood pressure and continuous bleeding. We discussed the plan for today and they are in agreement.   Objective:  Vital signs in last 24 hours: Vitals:   05/17/18 0209 05/17/18 0330 05/17/18 0335 05/17/18 0400  BP: (!) 137/52  (!) 142/61 (!) 141/81  Pulse: 66  71 70  Resp:   18 18  Temp:   98.4 F (36.9 C) 99.1 F (37.3 C)  TempSrc:   Oral Oral  SpO2:   100% 100%  Weight:  63.8 kg    Height:        General: Frail appearing male, no acute distress at the moment, some responsiveness Cardiac: RRR, normal S1, S2, no murmurs, rubs or gallops  Pulmonary: Lungs CTA bilaterally, no wheezing, rhonchi or rales  Abdomen: Bowel sounds present, soft Extremity: No LE edema  Assessment/Plan:  Active Problems:   GI bleed   Coronary artery disease involving native coronary artery of native heart without angina pectoris  This is a 84 year old male with a history of CAD, CKD, DM, HTN, combined systolic and diastolic heart failure, tachybradycardia syndrome status post dual chamber pacemaker, prostate cancer, and dementia presented with melena and hemoglobin of 6.4.  He had just recently been discharged after a GI bleed with presumed bleeding diverticuli.   GI bleed: Hemoglobin on admission was 6.4 was given 2 units of red blood cells and it increased to 7.7.  However he continued to have bloody bowel movements so another unit of blood was given.  Follow-up H&H after that was difficult to obtain due to a poor stick.  Patient became hypotensive and the daughters became very anxious so another unit of blood was given.  He has so far received 4 units of packed red blood cells.  His continued bleeding could be related to the diverticular bleeding.  He had an endoscopy done this morning which  showed no acute bleeding. -GI reports that possibility that patient's bleeding could possibly be from an alternative source, such as vascular ectasia or neoplasia however I think a GI bleed is more likely.  They recommend observation and supportive care for now.  Can consider a single column barium enema or a prep and colonoscopy however this would be very risky. -Follow-up H&H -Continue Protonix twice daily -GI on board, appreciate recommendations -Normal saline at 50 mL/hr -Transfuse likely goal around 8   Dementia: -Continue home Seroquel 50 mg  -Continue 125 mg Depakote  HTN: -Holding home amlodipine and atenolol and setting of GI bleed.  Lower blood pressures this morning.  History of HFrEF -Last EF on 2/18 showed EF 30 to 35% - Monitoring volume status given fluid and blood infusions -Appears euvolemic for now  DM type 2: -Continue home Lantus -Continue sliding scale insulin  Polyuria: Symptoms of increased urinary output.  Last admission showed a UA with 50 glucose and trace leukocytes..  Urine osmolarity was not concerning for diabetes insipidus.  On her last admission they were told that a 24-hour urine osmolality as an outpatient was recommended.  Now that he was readmitted today would like to do a 24 urine during this admission.  We will monitor this for now and consider the urine studies after stabilization of his acute issue.  FEN: NS 50cc/hr, replete lytes prn, NPO VTE ppx:  SCDs Code Status: Partial, No CPR or intubation, Yes to BiPAP, ACLS medications, and defibrillation  Dispo: Anticipated discharge in approximately 2-3 days.   Asencion Noble, MD 05/17/2018, 6:31 AM Pager: [phone]

## 2018-05-17 NOTE — Progress Notes (Signed)
Pt had, per conversation w/ family, multiple burgundy stools through the night.  Has now rec'd a total of 3 units of prc's this admission, one of them since his hgb of 7.7, finished c. 6 a.m.    Pt is in NAD and VS are stable, skin warm, does not appear shocky.  Pleasant but not conversant, follows commands.  Anesthesia support not available, will plan egd w/ no sedation using ultra-slim endoscope; if a finding requiring intervention is encountered, we will switch out to the standard endoscope and, if necessary, give MINIMAL sedation to decrease risk of cardiopulmonary instability.  Above plan d/w family who are agreeable.  Note that pt has not been on ulcerogenic medications so PUD seems unlikely; I presume this will end up being a diverticular bleed, but w/ nuclear medicine bleeding scan from yesterday showing evidence of bleeding from, possibly, the UGI tract we need to exclude a source in that location.  Cleotis Nipper, M.D. Pager [phone] If no answer or after 5 PM call [phone]

## 2018-05-17 NOTE — Progress Notes (Signed)
Internal Medicine Attending:   I saw and examined the patient. I reviewed the resident's note and I agree with the resident's findings and plan as documented in the resident's note.  Patient is noted to have multiple bloody bowel movements overnight.  Patient's daughters were at bedside this morning.  Patient was initially admitted for an acute GI bleed with acute blood loss anemia.  GI follow-up and recommendations appreciated.  Patient is status post an EGD today which showed no evidence of bleeding.  Patient did have a bleeding scan yesterday which was positive but was unable to determine the source of bleed.  The bleed is likely colonic in nature.  GI recommends supportive care and observation for now given the high risk for a colonoscopy given his multiple comorbidities.  We will continue with PPI twice daily and monitor his CBC for now.  Continue with maintenance fluids normal saline at 50 cc an hour.  No further work-up at this time.  Case discussed with daughters at bedside who are in agreement with plan.

## 2018-05-17 NOTE — Progress Notes (Signed)
Patient did not have any bowel movements or bleeding from 7am-7pm.

## 2018-05-18 ENCOUNTER — Encounter (HOSPITAL_COMMUNITY): Payer: Self-pay | Admitting: Gastroenterology

## 2018-05-18 LAB — BASIC METABOLIC PANEL
Anion gap: 8 (ref 5–15)
BUN: 58 mg/dL — AB (ref 8–23)
CALCIUM: 8.8 mg/dL — AB (ref 8.9–10.3)
CO2: 18 mmol/L — ABNORMAL LOW (ref 22–32)
Chloride: 117 mmol/L — ABNORMAL HIGH (ref 98–111)
Creatinine, Ser: 3 mg/dL — ABNORMAL HIGH (ref 0.61–1.24)
GFR calc Af Amer: 19 mL/min — ABNORMAL LOW (ref >60)
GFR, EST NON AFRICAN AMERICAN: 17 mL/min — AB (ref >60)
Glucose, Bld: 233 mg/dL — ABNORMAL HIGH (ref 70–99)
Potassium: 3.7 mmol/L (ref 3.5–5.1)
SODIUM: 143 mmol/L (ref 135–145)

## 2018-05-18 LAB — CBC
HCT: 27.3 % — ABNORMAL LOW (ref 39.0–52.0)
HEMATOCRIT: 28 % — AB (ref 39.0–52.0)
HEMOGLOBIN: 9.6 g/dL — AB (ref 13.0–17.0)
Hemoglobin: 9.5 g/dL — ABNORMAL LOW (ref 13.0–17.0)
MCH: 30.6 pg (ref 26.0–34.0)
MCH: 30.9 pg (ref 26.0–34.0)
MCHC: 34.8 g/dL (ref 30.0–36.0)
MCHC: 34.3 g/dL (ref 30.0–36.0)
MCV: 88.1 fL (ref 80.0–100.0)
MCV: 90 fL (ref 80.0–100.0)
NRBC: 0 % (ref 0.0–0.2)
PLATELETS: 104 10*3/uL — AB (ref 150–400)
Platelets: 123 10*3/uL — ABNORMAL LOW (ref 150–400)
RBC: 3.1 MIL/uL — ABNORMAL LOW (ref 4.22–5.81)
RBC: 3.11 MIL/uL — AB (ref 4.22–5.81)
RDW: 14.4 % (ref 11.5–15.5)
RDW: 14.6 % (ref 11.5–15.5)
WBC: 4.6 10*3/uL (ref 4.0–10.5)
WBC: 4.4 10*3/uL (ref 4.0–10.5)

## 2018-05-18 LAB — HEMOGLOBIN AND HEMATOCRIT, BLOOD
HEMATOCRIT: 30.8 % — AB (ref 39.0–52.0)
Hemoglobin: 10.4 g/dL — ABNORMAL LOW (ref 13.0–17.0)

## 2018-05-18 LAB — GLUCOSE, CAPILLARY
GLUCOSE-CAPILLARY: 177 mg/dL — AB (ref 70–99)
GLUCOSE-CAPILLARY: 204 mg/dL — AB (ref 70–99)
GLUCOSE-CAPILLARY: 113 mg/dL — AB (ref 70–99)
Glucose-Capillary: 192 mg/dL — ABNORMAL HIGH (ref 70–99)

## 2018-05-18 LAB — PREPARE RBC (CROSSMATCH)

## 2018-05-18 MED ORDER — SODIUM CHLORIDE 0.9% IV SOLUTION: Freq: Once | INTRAVENOUS | Status: DC

## 2018-05-18 MED ORDER — SODIUM CHLORIDE 0.9 % IV SOLN
INTRAVENOUS | Status: DC
  Administered 2018-05-18 – 2018-05-19 (×2): via INTRAVENOUS

## 2018-05-18 MED ORDER — GERHARDT'S BUTT CREAM
TOPICAL_CREAM | Freq: Four times a day (QID) | CUTANEOUS | Status: DC
  Administered 2018-05-18: 22:00:00 via TOPICAL
  Administered 2018-05-19: 1 via TOPICAL
  Administered 2018-05-19 (×3): via TOPICAL
  Administered 2018-05-20: 1 via TOPICAL
  Administered 2018-05-20 – 2018-05-22 (×9): via TOPICAL
  Administered 2018-05-23: 1 via TOPICAL
  Administered 2018-05-23 – 2018-05-27 (×12): via TOPICAL
  Filled 2018-05-18: qty 1

## 2018-05-18 MED ORDER — QUETIAPINE FUMARATE 25 MG PO TABS
25.0000 mg | ORAL_TABLET | ORAL | Status: AC | PRN
  Administered 2018-05-18 – 2018-05-19 (×2): 25 mg via ORAL
  Filled 2018-05-18 (×2): qty 1

## 2018-05-18 MED ORDER — QUETIAPINE FUMARATE 25 MG PO TABS: 25.0000 mg | ORAL_TABLET | ORAL | Status: DC | PRN

## 2018-05-18 NOTE — Progress Notes (Signed)
At around 2:30am, pt woke up and was agitated. He pulled his IV line off as well as the telemetry wires. The Geodon ordered was given. New IV line was put in and the telemetry wires were reconnected. EKG was completed to find out the Qtc. Pt is awake now. Will continue to monitor.

## 2018-05-18 NOTE — Progress Notes (Signed)
Paged from nurse about recurrent bleeding. H/H was not back from this morning. Family told the nurse that they wanted him to get blood, they report that he lost 2 units of blood. They asked to talk with the doctor, not the intern, and stated that they just wanted blood to be given. Went to discuss with the family with Dr. Dareen Piano. When we got there the daughter (the gastroenterologist) stated that we need to give the patient blood, that he has had 3 bloody bowel movements since this morning and that he has lost 2 units of blood. We told them that we wanted to wait for the H/H or CBC to come back however they were insistent that he gets some blood. They asked [place] to call Dr. Cristina Gong because they had talked to him. Dr. Dareen Piano called Dr. Cristina Gong, after a long discussion they came to the conclusion to transfer to step down, administer fluids, and recheck CBC. Given his history of heart failure, with an EF of 30-35% on an echo in 09/2016, there is a concern for fluid overload. He has already received 4 units of pRBC and 1L NS while he has been here.   The family reported that they would like to transfer his care another provider. They do not want to be transferred to the hospitalist service due to how often they change. They were okay with being transferred the other internal medicine team with Dr. Heber Ivanhoe as the attending.    Will not be giving a blood transfusion right now. He is having bloody bowel movements however he is hemodynamically stable at the moment and his most recent hemoglobin was 10.4.  -Transfer to stepdown for closer monitoring -Start NS at 75 cc/hr -Recheck CBC at 5, and q6 hours  -Transfuse if Hgb < 8 or he becomes hemodynamically unstable -Transfer patient to other team

## 2018-05-18 NOTE — Progress Notes (Addendum)
Internal Medicine Attending:   I saw and examined the patient. I reviewed the resident's note and I agree with the resident's findings and plan as documented in the resident's note.  Patient was seen with the residents earlier this morning.  Patient's daughter was at bedside.  Patient is currently admitted with an acute GI bleed and acute blood loss anemia.  When I evaluated him this morning patient's family reported that he had had no bloody bowel movements overnight.  However, shortly after this patient had moderate frank blood from his rectum as reported by the RN as well as the resident Dr. Sherry Ruffing who went to evaluate the patient after being paged.  Patient's vitals have remained stable throughout the day with SBP's in the 140s to 150s.  Heart rate has ranged from 60s to 80s.  Repeat CBC done showed a hemoglobin of 10.4 at 2:15 PM.  I was called to evaluate the patient by the family at approximately 2 PM.  Family stated that the patient had lost "2 units of blood" and they wanted the patient to be transfused.  I attempted to explain that it would not be appropriate to transfuse him at this time especially given his history of CHF with an EF of 30 to 35% but daughter stated that she did not care about "our protocols" and that she believes that he was not being treated appropriately and that he needs urgent transfusions.  After the hemoglobin resulted at 10.4 I went back to speak to the family to discuss holding off on transfusions at the current time.  Family stated that they spoke with Dr. Cristina Gong and that he should be transfused despite the hemoglobin.  I had an extensive conversation with Dr. Cristina Gong about my reasoning for not transfusing him at this time.  I explained to Dr. Cristina Gong that his vitals are stable and his hemoglobin was 10.4 and he has a history of cardiomyopathy with a reduced EF of 30 to 35% and that transfusion at this time may result in more harm than benefit especially with regard to  fluid overload as well as possible transfusion reactions like TACO or TRALI.  Dr. Cristina Gong was in agreement with the plan.  We will transfer the patient to stepdown unit and monitor his vitals more closely as well as start him on IV resuscitation with normal saline at a lower rate (given his heart failure).  We will monitor his CBCs frequently.  If his blood pressure drops or his hemoglobin drops below 8 we will transfuse PRBC.  The resident called the patient's daughter to relay this information to her.  The patient's family would also like to transfer her care to another physician at this time.  They do not want to be on the hospitalist service.  Dr. Johnnette Gourd has kindly agreed to take the patient onto his service beginning tomorrow.  Addendum: I also spoke with the chief of lab and transfusion services at [facility] (Dr. Beryle Beams) who advised me that it would be inappropriate to transfuse a patient whose hemoglobin was more than 8 and who was hemodynamically stable.  Total time spent in coordination of care for this patient including talking to the patient's family, Dr. Cristina Gong, Dr. Beryle Beams as well as accepting physician (Dr. Heber [place]) was approximately 90 minutes.

## 2018-05-18 NOTE — Progress Notes (Signed)
Record reviewed, including VS, labs, notes.  As outlined in Dr. Wilber Bihari note from earlier today, he and I spoke c. 3:30 pm after I had had telephone conversation w/ pt's dtr, Colette where it had seemed that transfusion, based on observed volume of blood loss, was in order.  However, after being made aware of all available information, including maintenance of stable vitals (which was not the case several days ago when the patient bled and dropped his pressure moderately, and increased his heart rate), and the fact that his hgb actually went UP several hours after the onset of today's re-bleeding, I found myself in agreement with Dr. Dareen Piano that transfusion at that time was neither necessary nor appropriate.   I did feel that close monitoring of VS (eg, q 1-2 hr, w/ periodic orthostatic measurements) and more frequent hgb measurements would help [place] keep a closer eye on how pt's observed bleeding is affecting him clinically.  Agree w/ transfer to stepdown unit to facilitate closer observation.  Thereafter, I did speak w/ Colette again and explained my reasoning for holding off on transfusion currently.  At that time, pt had been several hours without further bleeding.  While recognizing that it can take a while for the hgb to drop in the setting of bleeding, in reviewing pt's record currently, I am reassured by the fact that his most recent hgb is unchanged from 12 hrs earlier (which was prior to today's hematochezia), and that his pulse and BP remain in the acceptable range.  No transfusion so far today.  We will continue to follow pt with you but at the moment his current status suggests that the current management is satisfactory.  [name], M.D. Pager [phone] If no answer or after 5 PM call [phone]

## 2018-05-18 NOTE — Progress Notes (Signed)
[facility]: Pt is a active pt with [facility] a home based [facility] that is provided by [facility]. Pt receives nursing and social work with services. He has walker wheelchair and shower bench in home. Please call with any questions. [facility] Liaison  [phone]

## 2018-05-18 NOTE — Progress Notes (Signed)
1035:  Patient passed moderate frank blood from his rectum.  MD notified and came to bedside.  New orders noted. Marcille Blanco, RN

## 2018-05-18 NOTE — Progress Notes (Signed)
Subjective: Patient was resting comfortably this morning. Daughter at bedside. They reported that he had not bloody bowel movements overnight. GI had already seen them and they discussed the options. The daughter also brought up that he was on a different regimen yesterday, with Seroquel at 5:30 and Seroquel as needed for agitation and have Geodon as needed. We discussed the plan for today and they are in agreement.   Was paged about 30 minutes after seeing him that he was starting having frank red blood per rectum. Went to go see him again and daughter was at bedside. Went to evaluate and patient had about 1/4 cup of red blood on the pad. His repeat vitals at that time were WNL except for some hypertension. The daughter was very concerned and asked [place] to let the GI doctor aware of this. Told them that we would check an H/H for now, and contact GI to make aware. Contacted Dr. Cristina Gong.   Paged again a few hours later, he continues to have frank red blood per rectum. H/H has not resulted yet. Will await result and address as needed.   Objective:  Vital signs in last 24 hours: Vitals:   05/17/18 2237 05/18/18 0330 05/18/18 0332 05/18/18 0416  BP: (!) 152/75 (!) 152/83    Pulse: 63 81    Resp: 18 20    Temp: 98.5 F (36.9 C)  (!) 97.3 F (36.3 C)   TempSrc: Oral  Oral   SpO2: 100% 100%    Weight:    64.9 kg  Height:        General: Tired appearing male, no acute distress, intermittent agitation Cardiac: RRR, normal S1, S2, no murmurs, rubs or gallops  Pulmonary: Lungs CTA bilaterally, no wheezing, rhonchi or rales  Extremity: No LE edema, no muscle atrophy, no lesions or wounds noted   Assessment/Plan:  Active Problems:   GI bleed   Coronary artery disease involving native coronary artery of native heart without angina pectoris  This is a 84 year old male with a history of CAD, CKD, DM, HTN, combined systolic and diastolic heart failure, tachybradycardia syndrome status post dual  chamber pacemaker, prostate cancer, and dementia presented with melena and hemoglobin of 6.4.  He had just recently been discharged after a GI bleed with presumed bleeding diverticuli.   GI bleed: He has not been having bloody bowel movements overnight but started having later in the day. Received 4 units of pRBCs since admission. His Hgb improved to 10.5 on H/H. Spoke with Dr. Cristina Gong who reported that we can continue supportive care.  Given that he is continuing to have bloody bowel movements we are checking his H&H and will likely need to give blood. -Continue protonix BID -GI on board, appreciate recommendations -Discontinue fluids for now, he is euvolemic on exam and blood pressure around 150/80. -Follow-up on H&H, transfuse with goal hemoglobin around 8 -Supportive care and management  Dementia:  -Continue home Seroquel 50 mg  at 530 -Add Seroquel 25 mg as needed, call physician when this needs to be given -Continue 125 mg Depakote  History of HFrEF -Last EF on 2/18 showed EF 30 to 35% - Monitoring volume status given fluid and blood infusions, he has gotten 4 units and is on maintenance fluids.  -Today his lungs were clear to auscultation and no lower extremity edema. -Continue to monitor  Polyuria:  Daughter reports that he is still having increased urinary frequency.  His last admission this was assessed with studies.  UA  showed 50 glucose and trace leukocytes.  Urine osmolality was 303 and not concerning for diabetes insipidus.  They were informed had a 24-hour urine with osmolality was be appropriate as an outpatient.  Daughter is very concerned about this and his request will contact nephrology to discuss. -Stict I+Os -So far net input of 2,393 and net output of 3,175, total -781 during admission  FEN: No fluids cc/hr, replete lytes prn, fluids diet VTE ppx: SCDs Code Status: Partial   Dispo: Anticipated discharge in approximately 1-2 day(s).   Asencion Noble,  MD 05/18/2018, 6:23 AM Pager: [phone]

## 2018-05-18 NOTE — Progress Notes (Signed)
At around 10:30pm, pt woke up and care giver at bedside called nursing to bedside. Pt was assessed to be getting agitated. Resident ion call was notified. Geodon was ordered. Pharmacy was called to verify administration. Medication was sent to the unit. Upon reassessing the patient prior to the administration, pt was asleep. Pt still asleep at this time. Will continue to monitor pt.

## 2018-05-18 NOTE — Progress Notes (Signed)
Pt's family says to let pt sleep a while and be reassessed later on. Family/care giver at bedside refused SCDs, and was educated. Will continue to monitor.

## 2018-05-18 NOTE — Progress Notes (Signed)
Per conversation with family and review of chart, no bowel movements and no bleeding for past 24 hours.  Vital signs stable.  Hemoglobin has dropped slightly, from 10.1 yesterday to 9.5 this morning, but this is consistent with posttransfusion equilibration.  The main problem has been agitation.  Discussion and recommendations: The source, and the type of lesion the cause of the patient's bleeding is unclear.  Please see yesterday's notes, but in essence, this patient is known to have limited colonic diverticular disease, which is the most likely explanation, but not a foregone conclusion.  I discussed with the patient's daughter at the bedside the option of colonoscopy to try to ascertain more clearly what, if anything, is going on in his colon that might have accounted for the recent bleeding, but we agree that given his advanced age and medical comorbidities, not to mention the difficulty of putting this elderly gentleman through a prep, that it is inadvisable to do that, especially if the bleeding remains quiesced sent.  I further recommended that, even if he shows evidence of a fairly brisk rebleed, he would be best served by supportive care because there is a good chance that the bleeding would stop by the time he would actually be undergoing arteriography, and that exam would put him at significant risk of renal shutdown without any therapeutic benefit if the bleeding site were not identified.  It might be worth taking that risk if he were having exsanguinating bleeding, but that would be a decision that would have to be discussed with the family at the time, based on overall goals of care.  Time at bedside approximately 20 minutes.  We will continue to follow with you.  Cleotis Nipper, M.D. Pager [phone] If no answer or after 5 PM call [phone]

## 2018-05-18 NOTE — Progress Notes (Signed)
Patient report given to receiving nurse in [place].  Patient to be transferred to [facility] bed 26.  Marcille Blanco, RN

## 2018-05-19 DIAGNOSIS — D62 Acute posthemorrhagic anemia: Secondary | ICD-10-CM | POA: Diagnosis present

## 2018-05-19 DIAGNOSIS — R32 Unspecified urinary incontinence: Secondary | ICD-10-CM

## 2018-05-19 DIAGNOSIS — N5089 Other specified disorders of the male genital organs: Secondary | ICD-10-CM

## 2018-05-19 LAB — CBC
HEMATOCRIT: 28 % — AB (ref 39.0–52.0)
Hemoglobin: 9.4 g/dL — ABNORMAL LOW (ref 13.0–17.0)
MCH: 30.6 pg (ref 26.0–34.0)
MCHC: 33.6 g/dL (ref 30.0–36.0)
MCV: 91.2 fL (ref 80.0–100.0)
NRBC: 0 % (ref 0.0–0.2)
Platelets: 104 10*3/uL — ABNORMAL LOW (ref 150–400)
RBC: 3.07 MIL/uL — ABNORMAL LOW (ref 4.22–5.81)
RDW: 14.7 % (ref 11.5–15.5)
WBC: 4.8 10*3/uL (ref 4.0–10.5)

## 2018-05-19 LAB — GLUCOSE, CAPILLARY
GLUCOSE-CAPILLARY: 179 mg/dL — AB (ref 70–99)
GLUCOSE-CAPILLARY: 125 mg/dL — AB (ref 70–99)
Glucose-Capillary: 141 mg/dL — ABNORMAL HIGH (ref 70–99)
Glucose-Capillary: 181 mg/dL — ABNORMAL HIGH (ref 70–99)

## 2018-05-19 LAB — HEMOGLOBIN AND HEMATOCRIT, BLOOD
HEMATOCRIT: 27.7 % — AB (ref 39.0–52.0)
Hemoglobin: 9.3 g/dL — ABNORMAL LOW (ref 13.0–17.0)

## 2018-05-19 NOTE — Progress Notes (Signed)
This morning's hemoglobin is essentially unchanged from 24 hours earlier, despite the absence of any transfusion in the interim.  His last episode of hematochezia was approximately 15 hours ago.  On exam, the patient is lying comfortably in bed, relatively noncommunicative, skin is very warm and well perfused, radial pulse is full.  Would favor continuation of current management, except that I think patient could be upgraded to a low fiber (solid) diet.  We will continue to follow with you.  Close clinical monitoring, including vital signs and hemoglobin levels, will help to guide the timing of future transfusions if and when the patient has further bleeding, which unfortunately is moderately likely.  This was discussed with the patient's daughter at the bedside, and she seems agreeable with this course of action.  Cleotis Nipper, M.D. Pager [phone] If no answer or after 5 PM call [phone]

## 2018-05-19 NOTE — Progress Notes (Signed)
Internal Medicine Attending:   I saw and examined the patient. I reviewed Dr Prince's note and I agree with the resident's findings and plan as documented in the resident's note. Interval history as noted Agree with exam. Agree with plan with additions: Discussed with Patient's daughter she would like a repeat H&H this afternoon which is reasonable, if difficult to obtain can be aborted, otherwise we can likely change to daily CBC. For her polyuria, I discussed next steps with daughter if she wishes to pursue further workup ideally as outpatient, he currently does not have a foley and is incontinent so I do not think a 24 hour urine could be collected reliably.  I did discuss that I think his polyuria is likely related to his azotemia.

## 2018-05-19 NOTE — Plan of Care (Signed)
  Problem: Clinical Measurements: Goal: Will remain free from infection Outcome: Progressing Note:  No s/s of infection noted.   Problem: Pain Managment: Goal: General experience of comfort will improve Outcome: Progressing Note:  No s/s of pain or discomfort noted, except when turning and/or cleaning the patient.

## 2018-05-19 NOTE — Care Management Important Message (Signed)
Important Message  Patient Details  Name: [name] MRN: [id number] Date of Birth: 10-29-1922   Medicare Important Message Given:  Yes    Esaw Knippel 05/19/2018, 2:30 PM

## 2018-05-19 NOTE — Progress Notes (Signed)
   Subjective: Mr. Schiff seems a bit groggy this morning but denies any acute complaints including chest pain, abdominal pain, shortness of breath.  He has had one episode of melena last night at 6:30 PM.  He also had a small amount of bright red blood per rectum on his tissue paper after being cleaned up by nursing this morning.  He did not have any agitation overnight and did not require Seroquel or Geodon as previously needed.   Objective:  Vital signs in last 24 hours: Vitals:   05/18/18 1831 05/18/18 2137 05/19/18 0627 05/19/18 1127  BP: (!) 143/79 (!) 152/62 (!) 168/75 (!) 163/63  Pulse: 72 66 70 60  Resp:    20  Temp: 97.6 F (36.4 C) 98 F (36.7 C) (!) 97.4 F (36.3 C) 97.7 F (36.5 C)  TempSrc: Axillary Oral Axillary Oral  SpO2:  100% 97% 100%  Weight:   63.3 kg   Height:       Physical Exam  Constitutional: He is oriented to person, place, and time. He appears well-developed and well-nourished.  HENT:  Head: Normocephalic and atraumatic.  Eyes: EOM are normal.  Neck: Normal range of motion. Neck supple.  Cardiovascular: Normal rate, regular rhythm and normal heart sounds.  Pulmonary/Chest: Effort normal and breath sounds normal.  Genitourinary:  Genitourinary Comments: Ulcer noted on the right testicle.  No abrasions noted to the penis.  No penile discharge or erythema.  Neurological: He is alert and oriented to person, place, and time.  Skin: Skin is warm and dry.  Psychiatric: He has a normal mood and affect. His behavior is normal.  Nursing note and vitals reviewed.   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  Differential includes diverticular bleed, angiectasia, colorectal polyps and colorectal neoplasm.  Due to his advanced age, GI does not recommend colonoscopy at this time.  We  will continue to treat him with supportive care and management.  He had a large amount of melena last night. This morning he has only had a small amount of bright red blood per rectum when wiping.  His hemoglobin remains stable at 9.4.  We will repeat an H&H later this afternoon and if his hemoglobin continues to remain stable we will decrease the frequency of his H&H to every 24 hours.   Lower GI bleed with acute blood loss anemia: 1. Continue protonix BID 2. GI on board, appreciate recommendations 3. Change diet to soft low fiber diet   4. Follow-up H&H at 1600 today   Dementia:  1. Continue 125 mgDepakote 2. Continue Seroquel 25 mg PRN delerium  History of HFrEF: Last EF on 2/18 showed EF 30 to 35%.  He remains euvolemic today. 1. Continue to monitor  Polyuria: Net I/O over the last 24 hours: -1.27 L. I believe it is appropriate to have this worked up as an outpatient.  Labs to date have shown:  UA showed 50 glucose and trace leukocytes, and Urine osmolality was 303.   1. Stict I+Os  Dispo: Anticipated discharge in approximately 1-2 days.   [name], MD 05/19/2018, 1:51 PM Pager: [phone]

## 2018-05-20 LAB — BPAM RBC
BLOOD PRODUCT EXPIRATION DATE: 201910272359
BLOOD PRODUCT EXPIRATION DATE: 201911062359
BLOOD PRODUCT EXPIRATION DATE: 201911062359
Blood Product Expiration Date: 201910272359
Blood Product Expiration Date: 201911042359
Blood Product Expiration Date: 201911062359
ISSUE DATE / TIME: 201910150723
ISSUE DATE / TIME: 201910151509
ISSUE DATE / TIME: 201910160331
ISSUE DATE / TIME: 201910161045
UNIT TYPE AND RH: 6200
UNIT TYPE AND RH: 8400
Unit Type and Rh: 6200
Unit Type and Rh: 6200
Unit Type and Rh: 8400
Unit Type and Rh: 8400

## 2018-05-20 LAB — CBC WITH DIFFERENTIAL/PLATELET
Abs Immature Granulocytes: 0.02 10*3/uL (ref 0.00–0.07)
Basophils Absolute: 0 10*3/uL (ref 0.0–0.1)
Basophils Relative: 0 %
EOS PCT: 2 %
Eosinophils Absolute: 0.1 10*3/uL (ref 0.0–0.5)
HCT: 28.7 % — ABNORMAL LOW (ref 39.0–52.0)
HEMOGLOBIN: 9.3 g/dL — AB (ref 13.0–17.0)
Immature Granulocytes: 0 %
LYMPHS PCT: 14 %
Lymphs Abs: 0.8 10*3/uL (ref 0.7–4.0)
MCH: 29.7 pg (ref 26.0–34.0)
MCHC: 32.4 g/dL (ref 30.0–36.0)
MCV: 91.7 fL (ref 80.0–100.0)
MONO ABS: 0.4 10*3/uL (ref 0.1–1.0)
Monocytes Relative: 7 %
Neutro Abs: 4.4 10*3/uL (ref 1.7–7.7)
Neutrophils Relative %: 77 %
PLATELETS: 129 10*3/uL — AB (ref 150–400)
RBC: 3.13 MIL/uL — AB (ref 4.22–5.81)
RDW: 14.6 % (ref 11.5–15.5)
WBC: 5.8 10*3/uL (ref 4.0–10.5)
nRBC: 0 % (ref 0.0–0.2)

## 2018-05-20 LAB — TYPE AND SCREEN
ABO/RH(D): AB POS
Antibody Screen: NEGATIVE
UNIT DIVISION: 0
Unit division: 0
Unit division: 0
Unit division: 0
Unit division: 0
Unit division: 0

## 2018-05-20 LAB — GLUCOSE, CAPILLARY
Glucose-Capillary: 220 mg/dL — ABNORMAL HIGH (ref 70–99)
Glucose-Capillary: 218 mg/dL — ABNORMAL HIGH (ref 70–99)
Glucose-Capillary: 257 mg/dL — ABNORMAL HIGH (ref 70–99)

## 2018-05-20 MED ORDER — SODIUM CHLORIDE 0.9 % IV SOLN: INTRAVENOUS | Status: DC

## 2018-05-20 MED ORDER — LACTATED RINGERS IV SOLN
INTRAVENOUS | Status: DC
  Administered 2018-05-20 – 2018-05-21 (×2): via INTRAVENOUS

## 2018-05-20 MED ORDER — ORAL CARE MOUTH RINSE: 15.0000 mL | Freq: Two times a day (BID) | OROMUCOSAL | Status: DC

## 2018-05-20 MED ORDER — ATENOLOL 25 MG PO TABS
25.0000 mg | ORAL_TABLET | Freq: Every day | ORAL | Status: DC
  Administered 2018-05-20 – 2018-05-27 (×8): 25 mg via ORAL
  Filled 2018-05-20 (×8): qty 1

## 2018-05-20 MED ORDER — ORAL CARE MOUTH RINSE
15.0000 mL | Freq: Two times a day (BID) | OROMUCOSAL | Status: DC
  Administered 2018-05-20 – 2018-05-27 (×8): 15 mL via OROMUCOSAL

## 2018-05-20 NOTE — Progress Notes (Signed)
Subjective: Patient was seen and examined at bedside. Last Bm was yesterday morning and it contained red blood as per the nurse at bedside. He has been agitated and pulled out his IV lines yesterday.  Objective: Vital signs in last 24 hours: Temp:  [97.7 F (36.5 C)-98.3 F (36.8 C)] 98.3 F (36.8 C) (10/19 0457) Pulse Rate:  [60-85] 85 (10/19 0457) Resp:  [20-22] 22 (10/19 0457) BP: (137-163)/(50-68) 160/68 (10/19 0457) SpO2:  [99 %-100 %] 99 % (10/19 0457) Weight:  [66 kg] 66 kg (10/19 0457) Weight change: 2.744 kg Last BM Date: 05/18/18  IO:XBDZHGD, thin , frail GENERAL:mild pallor ABDOMEN:soft, non tender, normal BS EXTREMITIES:no deformity  Lab Results: Results for orders placed or performed during the hospital encounter of 05/16/18 (from the past 48 hour(s))  Glucose, capillary     Status: Abnormal   Collection Time: 05/18/18 11:04 AM  Result Value Ref Range   Glucose-Capillary 204 (H) 70 - 99 mg/dL  Prepare RBC     Status: None   Collection Time: 05/18/18  2:03 PM  Result Value Ref Range   Order Confirmation      ORDER PROCESSED BY BLOOD BANK Performed at [facility], [address] [place], [place] [address]   Hemoglobin and hematocrit, blood     Status: Abnormal   Collection Time: 05/18/18  2:17 PM  Result Value Ref Range   Hemoglobin 10.4 (L) 13.0 - 17.0 g/dL   HCT 30.8 (L) 39.0 - 52.0 %    Comment: Performed at [place] [address] [place], [place] [address]  Glucose, capillary     Status: Abnormal   Collection Time: 05/18/18  5:25 PM  Result Value Ref Range   Glucose-Capillary 113 (H) 70 - 99 mg/dL  CBC     Status: Abnormal   Collection Time: 05/18/18  7:37 PM  Result Value Ref Range   WBC 4.4 4.0 - 10.5 K/uL   RBC 3.11 (L) 4.22 - 5.81 MIL/uL   Hemoglobin 9.6 (L) 13.0 - 17.0 g/dL   HCT 28.0 (L) 39.0 - 52.0 %   MCV 90.0 80.0 - 100.0 fL   MCH 30.9 26.0 - 34.0 pg   MCHC 34.3 30.0 - 36.0 g/dL   RDW 14.6 11.5 - 15.5 %   Platelets  123 (L) 150 - 400 K/uL    Comment: Performed at [facility], [place] [address], [place], [place] [address]  Glucose, capillary     Status: Abnormal   Collection Time: 05/18/18  9:36 PM  Result Value Ref Range   Glucose-Capillary 192 (H) 70 - 99 mg/dL  CBC     Status: Abnormal   Collection Time: 05/19/18  4:06 AM  Result Value Ref Range   WBC 4.8 4.0 - 10.5 K/uL   RBC 3.07 (L) 4.22 - 5.81 MIL/uL   Hemoglobin 9.4 (L) 13.0 - 17.0 g/dL   HCT 28.0 (L) 39.0 - 52.0 %   MCV 91.2 80.0 - 100.0 fL   MCH 30.6 26.0 - 34.0 pg   MCHC 33.6 30.0 - 36.0 g/dL   RDW 14.7 11.5 - 15.5 %   Platelets 104 (L) 150 - 400 K/uL    Comment: REPEATED TO VERIFY PLATELET COUNT CONFIRMED BY SMEAR Immature Platelet Fraction may be clinically indicated, consider ordering this additional test [id number]    nRBC 0.0 0.0 - 0.2 %    Comment: Performed at [facility], [place] [address], [place], [place] [address]  Glucose, capillary     Status: Abnormal   Collection Time: 05/19/18  7:35 AM  Result Value Ref Range   Glucose-Capillary 179 (H) 70 - 99 mg/dL  Glucose, capillary     Status: Abnormal   Collection Time: 05/19/18 11:26 AM  Result Value Ref Range   Glucose-Capillary 125 (H) 70 - 99 mg/dL  Hemoglobin and hematocrit, blood     Status: Abnormal   Collection Time: 05/19/18  1:20 PM  Result Value Ref Range   Hemoglobin 9.3 (L) 13.0 - 17.0 g/dL   HCT 27.7 (L) 39.0 - 52.0 %    Comment: Performed at [facility], [place] [address] [place], [place] [address]  Glucose, capillary     Status: Abnormal   Collection Time: 05/19/18  4:08 PM  Result Value Ref Range   Glucose-Capillary 141 (H) 70 - 99 mg/dL  Glucose, capillary     Status: Abnormal   Collection Time: 05/19/18  8:43 PM  Result Value Ref Range   Glucose-Capillary 181 (H) 70 - 99 mg/dL  CBC with Differential     Status: Abnormal   Collection Time: 05/20/18  5:16 AM  Result Value Ref Range   WBC 5.8 4.0 - 10.5 K/uL   RBC 3.13 (L) 4.22 - 5.81  MIL/uL   Hemoglobin 9.3 (L) 13.0 - 17.0 g/dL   HCT 28.7 (L) 39.0 - 52.0 %   MCV 91.7 80.0 - 100.0 fL   MCH 29.7 26.0 - 34.0 pg   MCHC 32.4 30.0 - 36.0 g/dL   RDW 14.6 11.5 - 15.5 %   Platelets 129 (L) 150 - 400 K/uL   nRBC 0.0 0.0 - 0.2 %   Neutrophils Relative % 77 %   Neutro Abs 4.4 1.7 - 7.7 K/uL   Lymphocytes Relative 14 %   Lymphs Abs 0.8 0.7 - 4.0 K/uL   Monocytes Relative 7 %   Monocytes Absolute 0.4 0.1 - 1.0 K/uL   Eosinophils Relative 2 %   Eosinophils Absolute 0.1 0.0 - 0.5 K/uL   Basophils Relative 0 %   Basophils Absolute 0.0 0.0 - 0.1 K/uL   Immature Granulocytes 0 %   Abs Immature Granulocytes 0.02 0.00 - 0.07 K/uL    Comment: Performed at [facility], [address] [place], [place] [address]  Glucose, capillary     Status: Abnormal   Collection Time: 05/20/18  7:47 AM  Result Value Ref Range   Glucose-Capillary 220 (H) 70 - 99 mg/dL    Studies/Results: No results found.  Medications: I have reviewed the patient's current medications.  Assessment: Hematochezia- could be related to diverticulosis, stercoral ulcer, internal hemorrhoids, AVMs.  Plan: As the bleeding scan was positive for bleeding in right mid upper abdomen with a normal EGD, there is a possibility of SB bleed. Even is this is related to AVMs, the management will likely not change but his daughter(who is a gastroenterologist) wants to pursue this for diagnostic purposes.  Plan clear liquid diet except Glucerna today and SB pillcam in am. Ronnette Juniper 05/20/2018, 10:27 AM   Pager [phone] If no answer or after 5 PM call [phone]

## 2018-05-20 NOTE — Progress Notes (Signed)
Paged by nursing staff about agitation. Per nursing staff, [name] was observed pulling on IV, taking off telemetry and taking off his clothes. Examined and evaluated at bedside. He was observed sleeping comfortably in bed naked. When nursing staff tried to put in new IV, was noticed grimacing and appear in distress. Currently requiring IV for maintenance fluid but was observed tolerating some PO intake with apple sauce. Will hold off on restarting IV fluids at this point for the sake of keeping patient comfortable and reducing agitation.

## 2018-05-20 NOTE — Progress Notes (Signed)
   Subjective: Pt had some agitation last night removed IV and some agitation this am during diaper change.  On my visit he is resting comfortably had received his depakote recently, his IV was also just replaced.    Objective:  Vital signs in last 24 hours: Vitals:   05/19/18 1127 05/19/18 2002 05/20/18 0457 05/20/18 1143  BP: (!) 163/63 (!) 137/50 (!) 160/68 (!) 161/60  Pulse: 60 80 85 81  Resp: 20  (!) 22 18  Temp: 97.7 F (36.5 C) 98.2 F (36.8 C) 98.3 F (36.8 C) 97.7 F (36.5 C)  TempSrc: Oral Axillary Axillary Oral  SpO2: 100% 100% 99% 100%  Weight:   66 kg   Height:       Cardiac: normal rate and rhythm, clear s1 and s2 Pulmonary: CTAB, not in distress Abdominal: non distended abdomen, soft and nontender Extremities: no LE edema Psych: somnolent  Assessment/Plan:  Lower GI bleed with acute blood loss anemia: tagged rbc scan positive but location difficult to  discern  Continue protonix BID Pill endoscopy in am  Continue to trend hgb   Dementia:   Continue 125 mgDepakote Continue Seroquel 50 mg daily Further meds require evaluation  History of HFrEF: Last EF on 2/18 showed EF 30 to 35%.  He remains euvolemic today.  Continue to monitor  Polyuria: I believe it is appropriate to have this worked up as an outpatient.  Labs to date have shown:  UA showed 50 glucose and trace leukocytes, and Urine osmolality was 303.   Stict I+Os  Maintenance LR 75cc/hr  Dispo: Anticipated discharge in approximately 1-2 days.   Katherine Roan, MD 05/20/2018, 12:39 PM

## 2018-05-20 NOTE — Progress Notes (Signed)
Pt became increasingly agitated throughout the beginning of night shift. Pt kept pulling off gown, telemetry leads, and pulling at IV with fluids infusing. Paged MD at 2230 about giving Seroquel dose. After medication, pt stopped pulling on gown, telemetry leads and IV. Around 0300, pt started to become restless again, pulled IV out, pulled off gown and telemetry leads.  Paged MD, MD came to bedside.  IV with fluids infusing patient pulled out, second site RN attempted to flush and patient pulled arm back and moaned during flushing.  RN was able to reapply telemetry leads and left gown off that patient had removed, covering patient with a blanket.  When staff not interacting with patient, patient resting in bed with eyes closed, not pulling at telemetry leads.  RN discussed with Internal Medicine Residency MD that rounded about need to remove IV due to patient's reaction when flushing and need to start new IV site to continue IV fluids.  Per MD ok to hold off on attempting to start new IV at this time.  Will continue to monitor.

## 2018-05-21 ENCOUNTER — Encounter (HOSPITAL_COMMUNITY)
Admission: EM | Disposition: A | Discharge status: Home / Self Care | Payer: Self-pay | Source: Home / Self Care | Attending: Internal Medicine

## 2018-05-21 DIAGNOSIS — I5020 Unspecified systolic (congestive) heart failure: Secondary | ICD-10-CM

## 2018-05-21 DIAGNOSIS — F0391 Unspecified dementia with behavioral disturbance: Secondary | ICD-10-CM

## 2018-05-21 HISTORY — PX: GIVENS CAPSULE STUDY: SHX5432

## 2018-05-21 LAB — CBC
HCT: 24.8 % — ABNORMAL LOW (ref 39.0–52.0)
HCT: 24.9 % — ABNORMAL LOW (ref 39.0–52.0)
Hemoglobin: 8.1 g/dL — ABNORMAL LOW (ref 13.0–17.0)
Hemoglobin: 8.1 g/dL — ABNORMAL LOW (ref 13.0–17.0)
MCH: 30.2 pg (ref 26.0–34.0)
MCH: 30 pg (ref 26.0–34.0)
MCHC: 32.7 g/dL (ref 30.0–36.0)
MCHC: 32.5 g/dL (ref 30.0–36.0)
MCV: 92.5 fL (ref 80.0–100.0)
MCV: 92.2 fL (ref 80.0–100.0)
NRBC: 0 % (ref 0.0–0.2)
NRBC: 0 % (ref 0.0–0.2)
PLATELETS: 118 10*3/uL — AB (ref 150–400)
PLATELETS: 123 10*3/uL — AB (ref 150–400)
RBC: 2.68 MIL/uL — AB (ref 4.22–5.81)
RBC: 2.7 MIL/uL — AB (ref 4.22–5.81)
RDW: 14.8 % (ref 11.5–15.5)
RDW: 14.9 % (ref 11.5–15.5)
WBC: 4.8 10*3/uL (ref 4.0–10.5)
WBC: 5.4 10*3/uL (ref 4.0–10.5)

## 2018-05-21 LAB — COMPREHENSIVE METABOLIC PANEL
ALT: 13 U/L (ref 0–44)
ANION GAP: 8 (ref 5–15)
AST: 22 U/L (ref 15–41)
Albumin: 2.6 g/dL — ABNORMAL LOW (ref 3.5–5.0)
Alkaline Phosphatase: 49 U/L (ref 38–126)
BUN: 45 mg/dL — ABNORMAL HIGH (ref 8–23)
CO2: 18 mmol/L — AB (ref 22–32)
CREATININE: 2.76 mg/dL — AB (ref 0.61–1.24)
Calcium: 8.7 mg/dL — ABNORMAL LOW (ref 8.9–10.3)
Chloride: 117 mmol/L — ABNORMAL HIGH (ref 98–111)
GFR, EST AFRICAN AMERICAN: 21 mL/min — AB (ref >60)
GFR, EST NON AFRICAN AMERICAN: 18 mL/min — AB (ref >60)
Glucose, Bld: 195 mg/dL — ABNORMAL HIGH (ref 70–99)
Potassium: 4 mmol/L (ref 3.5–5.1)
Sodium: 143 mmol/L (ref 135–145)
TOTAL PROTEIN: 5 g/dL — AB (ref 6.5–8.1)
Total Bilirubin: 0.7 mg/dL (ref 0.3–1.2)

## 2018-05-21 LAB — GLUCOSE, CAPILLARY
GLUCOSE-CAPILLARY: 158 mg/dL — AB (ref 70–99)
GLUCOSE-CAPILLARY: 180 mg/dL — AB (ref 70–99)
GLUCOSE-CAPILLARY: 188 mg/dL — AB (ref 70–99)

## 2018-05-21 SURGERY — IMAGING PROCEDURE, GI TRACT, INTRALUMINAL, VIA CAPSULE

## 2018-05-21 MED ORDER — ZIPRASIDONE MESYLATE 20 MG IM SOLR: 10.0000 mg | Freq: Once | INTRAMUSCULAR | Status: DC

## 2018-05-21 MED ORDER — ZIPRASIDONE MESYLATE 20 MG IM SOLR
5.0000 mg | Freq: Once | INTRAMUSCULAR | Status: DC | PRN
  Filled 2018-05-21: qty 20

## 2018-05-21 MED ORDER — QUETIAPINE FUMARATE 50 MG PO TABS
50.0000 mg | ORAL_TABLET | Freq: Once | ORAL | Status: AC
  Administered 2018-05-21: 50 mg via ORAL

## 2018-05-21 NOTE — Progress Notes (Signed)
PillCam to be placed today. Results should be available tomorrow.  [name]

## 2018-05-21 NOTE — Progress Notes (Signed)
Pt pulling on gown, IV, and trying to get out of bed. Paged MD, MD came to assess patient. When the  Internal Medicine residents came to the bedside, pt was attempting to bite staff and himself. Got orders for seroquel 50mg  for agitation. Will continue to monitor pt.

## 2018-05-21 NOTE — Progress Notes (Addendum)
Paged by nurse for patient having agitation, trying to get out of bed and remove lines and clothing. On visiting patient's room he was attempting to bite and get out of bed and not responding at attempts at redirection or to calm him. His caregiver was at bedside and stated he usually receives seroquel in the evening but his normal dose has not been adequate while in the hospital. On exam he was tachycardic and physical exam was limited by patient cooperation. His abdomen was soft, non-distended and was unable to determine if he was TTP. He had previously had a very small BM that was dark brown and without BRB per nursing.   - seroquel 50 mg given for agitation   Kymberli Wiegand A, DO 05/21/2018, 1:51 AM Pager: [phone]

## 2018-05-21 NOTE — Progress Notes (Signed)
Internal Medicine Attending:   I saw but was unable to examine the patient secondary to agitation. I reviewed the resident's note and I agree with the resident's findings and plan as documented in the resident's note.  Patient was initially admitted to the hospital with a lower GI bleed and acute blood loss anemia.  Patient's daughter was at bedside.  She explained that the patient was agitated overnight and appears to be agitated this morning.  He did require an extra dose of Seroquel overnight.  We will continue to monitor him closely.  Continue with Depakote 125 mg as well as Seroquel 50 mg daily.  We will continue Protonix twice daily for his GI bleed.  Patient's hemoglobin decreased to 8.1 today.  Patient's vitals are stable currently.  We will repeat CBC later today to ensure that hemoglobin remained stable.  If his repeat hemoglobin is less than 8 will transfuse 1 unit PRBC.

## 2018-05-21 NOTE — Progress Notes (Signed)
Pt swallowed capsule at 1108 without difficulty. Pt's family educated and verbalized understanding.

## 2018-05-21 NOTE — Progress Notes (Signed)
   Subjective: Pt had some more agitation last night was trying to remove his IV and clothes, was aggressive with his nurse.  This morning he was somnolent but resisted when we tried to listen to his heart and lungs.     Objective:  Vital signs in last 24 hours: Vitals:   05/20/18 2045 05/21/18 0535 05/21/18 1058 05/21/18 1200  BP: (!) 155/72 (!) 155/56  (!) 162/69  Pulse:  70  72  Resp:  19  20  Temp:  97.9 F (36.6 C)  (!) 97.5 F (36.4 C)  TempSrc:  Axillary  Oral  SpO2:  95%  100%  Weight:  66.5 kg 66.5 kg   Height:   5\' 11"  (1.803 m)    Cardiac: normal rate and rhythm, clear s1 and s2 Pulmonary: CTAB, not in distress Abdominal: non distended abdomen, soft and nontender Extremities: no LE edema Psych: somnolent  Assessment/Plan:  Lower GI bleed with acute blood loss anemia: tagged rbc scan positive but location difficult to  discern  Continue protonix BID Pill endoscopy this am Hgb 8.1 this am Continue to trend hgb daily  Dementia:   Continue 125 mgDepakote Continue Seroquel 50 mg daily Further meds require evaluation  History of HFrEF: Last EF on 2/18 showed EF 30 to 35%.  He remains euvolemic today.  Continue to monitor  Polyuria: I believe it is appropriate to have this worked up as an outpatient or not at all.  Labs to date have shown:  UA showed 50 glucose and trace leukocytes, and Urine osmolality was 303.   Stict I+Os  In the afternoon daugther requested stopping LR due to pedal edema, it was d/ced  Dispo: Anticipated discharge in approximately 1-2 days.   Katherine Roan, MD 05/21/2018, 1:27 PM

## 2018-05-22 ENCOUNTER — Encounter (HOSPITAL_COMMUNITY): Payer: Self-pay | Admitting: Gastroenterology

## 2018-05-22 DIAGNOSIS — K5520 Angiodysplasia of colon without hemorrhage: Secondary | ICD-10-CM

## 2018-05-22 LAB — CBC
HCT: 27.6 % — ABNORMAL LOW (ref 39.0–52.0)
HEMOGLOBIN: 8.8 g/dL — AB (ref 13.0–17.0)
MCH: 30.4 pg (ref 26.0–34.0)
MCHC: 31.9 g/dL (ref 30.0–36.0)
MCV: 95.5 fL (ref 80.0–100.0)
Platelets: 164 10*3/uL (ref 150–400)
RBC: 2.89 MIL/uL — ABNORMAL LOW (ref 4.22–5.81)
RDW: 14.8 % (ref 11.5–15.5)
WBC: 7.1 10*3/uL (ref 4.0–10.5)
nRBC: 0 % (ref 0.0–0.2)

## 2018-05-22 LAB — GLUCOSE, CAPILLARY
GLUCOSE-CAPILLARY: 130 mg/dL — AB (ref 70–99)
Glucose-Capillary: 159 mg/dL — ABNORMAL HIGH (ref 70–99)
Glucose-Capillary: 209 mg/dL — ABNORMAL HIGH (ref 70–99)
Glucose-Capillary: 160 mg/dL — ABNORMAL HIGH (ref 70–99)

## 2018-05-22 LAB — OCCULT BLOOD X 1 CARD TO LAB, STOOL: Fecal Occult Bld: POSITIVE — AB

## 2018-05-22 MED ORDER — LACTATED RINGERS IV SOLN
INTRAVENOUS | Status: DC
  Administered 2018-05-22 – 2018-05-23 (×2): via INTRAVENOUS

## 2018-05-22 NOTE — Progress Notes (Signed)
[facility] Gastroenterology Progress Note  [name] 84 y.o. Mar 15, 1923   Subjective: Daughter reports 4 black stools overnight (Incontinent black stool noted in chart at 8 pm last night). Patient demented and unable to get any history from him. Caretaker and daughter at bedside.  Objective: Vital signs: Vitals:   05/21/18 2031 05/22/18 0431  BP: (!) 151/69 (!) 154/76  Pulse: 69 75  Resp: (!) 28 (!) 24  Temp: 97.8 F (36.6 C) 98.1 F (36.7 C)  SpO2: 100% 99%    Physical Exam: Gen: lethargic, elderly, frail, demented, thin  HEENT: anicteric sclera CV: RRR Chest: Coarse breath sounds  Abd: epigastric tenderness with guarding, otherwise nontender, soft, nondistended, +BS Ext: no edema  Lab Results: Recent Labs    05/21/18 0655  NA 143  K 4.0  CL 117*  CO2 18*  GLUCOSE 195*  BUN 45*  CREATININE 2.76*  CALCIUM 8.7*   Recent Labs    05/21/18 0655  AST 22  ALT 13  ALKPHOS 49  BILITOT 0.7  PROT 5.0*  ALBUMIN 2.6*   Recent Labs    05/20/18 0516  05/21/18 1526 05/22/18 0421  WBC 5.8   < > 5.4 7.1  NEUTROABS 4.4  --   --   --   HGB 9.3*   < > 8.1* 8.8*  HCT 28.7*   < > 24.9* 27.6*  MCV 91.7   < > 92.2 95.5  PLT 129*   < > 123* 164   < > = values in this interval not displayed.      Assessment/Plan: Capsule endoscopy showed two tiny nonbleeding AVMs and minimal to mild gastritis. Obscure GI bleeding. Question colonic source such as diverticulosis vs stercoral ulcer. Colonoscopy in 2012 with 3 tubular adenomas removed and no report of diverticulosis. If bleeding worsens, then may need an unprepped flex sig otherwise would manage conservatively. Daughter, Dr. Hipolito Bayley does not want any invasive GI procedures unless absolutely necessary and I agree with this conservative approach due to his advanced age and comorbidities.   [name] 05/22/2018, 1:23 PM  Questions please call [phone] ID: [name], male   DOB:  06-08-1923, 84 y.o.   MRN: [id number]

## 2018-05-22 NOTE — Progress Notes (Addendum)
   Subjective: Mr. Radin is non-communicative this morning and does not respond to questions. The overnight events is provided by his daughter and per chart review. He did not have any acute events overnight. He continues to have intermittent agitation but did not require any extra doses of Seroquel. He has not had any episodes of frank blood overnight and his Hb is up today to 8.8.   Objective:  Vital signs in last 24 hours: Vitals:   05/21/18 1200 05/21/18 1531 05/21/18 2031 05/22/18 0431  BP: (!) 162/69 (!) 158/73 (!) 151/69 (!) 154/76  Pulse: 72 70 69 75  Resp: 20  (!) 28 (!) 24  Temp: (!) 97.5 F (36.4 C)  97.8 F (36.6 C) 98.1 F (36.7 C)  TempSrc: Oral  Oral Oral  SpO2: 100%  100% 99%  Weight:    65.6 kg  Height:       Physical Exam  Constitutional:  Thin appearing male lying in bed in no acute distress. He becomes agitated when trying to get him to follow commands.   HENT:  Head: Normocephalic and atraumatic.  Eyes:  He will not allow me to examen his eyes this morning.   Cardiovascular: Normal rate, regular rhythm and normal heart sounds.  Pulmonary/Chest: Effort normal and breath sounds normal.  Abdominal: Soft. Bowel sounds are normal. There is no tenderness.  Musculoskeletal: He exhibits no edema or tenderness.  Neurological:  He is non-communicative this morning. He does not respond to commands and becomes very agitated when trying to examen him.   Skin: Skin is warm and dry.  Psychiatric:  Very agitated this morning.     Assessment/Plan:  Active Problems:   GI bleed   Coronary artery disease involving native coronary artery of native heart without angina pectoris   Anemia  Lower GI bleed with acute blood loss anemia: Tagged rbc scan positive but location difficult to  Discern. Pill endoscopy performed yesterday morning, results pending. Hb this morning 8.8 (up from 8.1). 1. Continue protonix IV BID 2. Follow-up pill endoscopy results 3. Continue to  trend hgb daily 4. Restart lactated ringers @ 75 mL/hr 5. Continue thin liquid diet. Will advance diet once pill endoscopy results return.   Dementia: 1. Continue 125 mgDepakote 2. Continue Seroquel 50 mg daily 3. Further meds require evaluation  History of HFrEF: Last EF on 2/18 showed EF 30 to 35%.  He remains euvolemic today. 1. Continue to monitor  Polyuria: I believe it is appropriate to have this worked up as an outpatient or not at all.  Labs to date have shown: UA showed 50 glucose and trace leukocytes, and Urine osmolality was 303.  1. Stict I+Os  Dispo: Anticipated discharge in approximately1-2days.   [name], MD 05/22/2018, 6:36 AM Pager: [phone]

## 2018-05-22 NOTE — Progress Notes (Addendum)
Family stated patient's breathing pattern had changed. They requested to stop IV fluids. I checked his lung sounds- diminished, but not overloaded. Oxygen status 99% on room air. Pt comfortable and not in distress. Offered oxygen use but they refused. Daughter did not express any other needs. Will continue to monitor.

## 2018-05-22 NOTE — Progress Notes (Signed)
Internal Medicine Attending:   I saw and examined the patient. I reviewed Dr Prince's note and I agree with the resident's findings and plan as documented in the resident's note.  Pill endoscopy revealed 2 small non bleeding AVM. GI plans to continue conservative management. Agree with restart of gentile IVF. Daughter updated at bedside.

## 2018-05-23 DIAGNOSIS — I110 Hypertensive heart disease with heart failure: Secondary | ICD-10-CM

## 2018-05-23 DIAGNOSIS — K5520 Angiodysplasia of colon without hemorrhage: Secondary | ICD-10-CM | POA: Diagnosis present

## 2018-05-23 LAB — CBC
HCT: 25.3 % — ABNORMAL LOW (ref 39.0–52.0)
HEMOGLOBIN: 8.5 g/dL — AB (ref 13.0–17.0)
MCH: 30.8 pg (ref 26.0–34.0)
MCHC: 33.6 g/dL (ref 30.0–36.0)
MCV: 91.7 fL (ref 80.0–100.0)
NRBC: 0 % (ref 0.0–0.2)
PLATELETS: 155 10*3/uL (ref 150–400)
RBC: 2.76 MIL/uL — ABNORMAL LOW (ref 4.22–5.81)
RDW: 14.3 % (ref 11.5–15.5)
WBC: 7.2 10*3/uL (ref 4.0–10.5)

## 2018-05-23 LAB — GLUCOSE, CAPILLARY
GLUCOSE-CAPILLARY: 268 mg/dL — AB (ref 70–99)
GLUCOSE-CAPILLARY: 209 mg/dL — AB (ref 70–99)
GLUCOSE-CAPILLARY: 246 mg/dL — AB (ref 70–99)
Glucose-Capillary: 163 mg/dL — ABNORMAL HIGH (ref 70–99)

## 2018-05-23 MED ORDER — AMLODIPINE BESYLATE 5 MG PO TABS
5.0000 mg | ORAL_TABLET | Freq: Every day | ORAL | Status: DC
  Administered 2018-05-23 – 2018-05-27 (×5): 5 mg via ORAL
  Filled 2018-05-23 (×5): qty 1

## 2018-05-23 NOTE — Care Management Important Message (Signed)
Important Message  Patient Details  Name: [name] MRN: [id number] Date of Birth: 1922/08/22   Medicare Important Message Given:  Yes    [name] P [place] 05/23/2018, 1:11 PM

## 2018-05-23 NOTE — Care Management Note (Signed)
Case Management Note  Patient Details  Name: [name] MRN: [id number] Date of Birth: Oct 28, 1922  Subjective/Objective: Patient presented for GI Bleed. PTA from home with support of wife and personal care providers that provide 12 hours of care.                    Action/Plan: CM did discuss with daughter and wife in regards to DME- wife feels that patient will not need Hospital Bed. CM did discuss with wife that she may think about 24 hour care in the home. CM will continue to monitor for additional disposition needs.   Expected Discharge Date:             Expected Discharge Plan:  [place]  In-House Referral:  NA  Discharge planning Services  CM Consult  Post Acute Care Choice:    Choice offered to:     DME Arranged:    DME Agency:     HH Arranged:    HH Agency:     Status of Service:  In process, will continue to follow  If discussed at Long Length of Stay Meetings, dates discussed:    Additional Comments:  Bethena Roys, RN 05/23/2018, 1:53 PM

## 2018-05-23 NOTE — Progress Notes (Signed)
   Subjective: Paul Paul had no acute events overnight.  He did not require extra Seroquel.  Per nursing note, he had one bowel movement which was "black with red streaks."  His daughter states that he does seem to be doing much better this morning.  She believes that he is ready to start physical therapy.  Objective:  Vital signs in last 24 hours: Vitals:   05/23/18 0637 05/23/18 1149 05/23/18 1320 05/23/18 1515  BP: (!) 162/84 135/70  (!) 159/62  Pulse: 85 70 82 73  Resp:      Temp:    98.2 F (36.8 C)  TempSrc:    Axillary  SpO2: 100%     Weight: 66.1 kg     Height:       Physical Exam  Constitutional: He is oriented to person, place, and time. He appears well-developed and well-nourished.  HENT:  Head: Normocephalic and atraumatic.  Eyes: EOM are normal.  Cardiovascular: Normal rate and regular rhythm.  Pulmonary/Chest:  Auscultated lungs anteriorly.  Decreased breath sounds.   Abdominal: Soft. Bowel sounds are normal. He exhibits no distension.  Musculoskeletal: He exhibits no edema or tenderness.  Neurological: He is alert and oriented to person, place, and time.  Skin: Skin is warm and dry.  Psychiatric:  He did seem more alert today and was able to answer several of my questions  Nursing note and vitals reviewed.   Assessment/Plan:  Active Problems:   Dementia (HCC)   Benign essential HTN   Chronic combined systolic (congestive) and diastolic (congestive) heart failure (HCC)   Acute GI bleeding   Coronary artery disease involving native coronary artery of native heart without angina pectoris   Acute blood loss anemia   AVM (arteriovenous malformation) of small bowel, acquired  Lower GI bleed with acute blood loss anemia: Differential includes hemorrhoids vs stercoral ulcer vs diverticulosis. Pill endoscopy performed yesterday and shows 2 small non-bleeding AVM. Hb this morning stable at 8.5 (yesterday 8.8).  1. Continue protonix IV BID 2. Continue to trend  hgbdaily 4. Continue lactated ringers @ 25 mL/hr to keep intravenous access patent in case he has another large GI bleed 5. Continue soft diet. Advance diet as tolerated 6. He will need to be on MiraLAX at discharge to soften the stool decreases likelihood of another bleed. 7. Ordered physical therapy today  Dementia: 1. Continue 125 mgDepakote 2. Continue Seroquel 50 mg daily 3. Further meds require evaluation  History of HFrEF: Last EF on 2/18 showed EF 30 to 35%. He remains euvolemic today. 1. Continue to monitor  Polyuria:I believe it is appropriate to have this worked up as an outpatient or not at all. Labs to date have shown: UA showed 50 glucose and trace leukocytes, and Urine osmolality was 303.  1. Stict I+Os  Hypertension: His blood pressure has remained elevated with systolics of 161W to 960A.  We will restart 1 of his blood pressure medications amlodipine at a lower dose. 1. Start amlodipine 5 mg QD  Dispo: Anticipated discharge in approximately1-2days.  Paul Sage, MD 05/23/2018, 4:17 PM Pager: [phone]

## 2018-05-23 NOTE — Evaluation (Addendum)
Physical Therapy Evaluation Patient Details Name: [name] MRN: [id number] DOB: 1923-03-13 Today's Date: 05/23/2018   History of Present Illness  84 yo admitted with GIB and anemia. PMHx: dementia, HF, diverticulosis, CAD, CABG, pacemaker, DM, CKD, prostate CA  Clinical Impression  Pt sidelying in bed on arrival. Daughter present and reports pt fatigued from getting OOB to chair earlier, getting cleaned after toileting and having medication. She states he is fatigued yet when offered to return next date she requested attempt at this time. PT able to sit EoB with increased time and assist, non verbal throughout session and very clearly returning to bed without attempt for standing or mobility despite cues and prompting. Encouraged continued mobility with nursing. PT with decreased strength, balance, function and gait who will benefit from acute therapy to maximize mobility and safety to decrease burden of care. Daughter reports pt does have 2 steps to enter home and discussed potential need to bump pt up stairs in [facility] should he not be able to physically step up them.     Follow Up Recommendations Supervision/Assistance - 24 hour;Home health PT(family reports poor participation with home therapy)    Equipment Recommendations  Hospital bed    Recommendations for Other Services       Precautions / Restrictions Precautions Precautions: Fall      Mobility  Bed Mobility Overal bed mobility: Needs Assistance Bed Mobility: Supine to Sit;Sit to Supine     Supine to sit: Min assist Sit to supine: Min guard   General bed mobility comments: min assist to rise from surface with assist to initiate moving legs off of bed and pt using HHA to pull trunk up. Pt able to sit EOB with minguard , cues for further mobility but pt returned to supine unassisted  Transfers                 General transfer comment: unable to get pt to participate to further mobilize at this  time  Ambulation/Gait                Stairs            Wheelchair Mobility    Modified Rankin (Stroke Patients Only)       Balance Overall balance assessment: Needs assistance   Sitting balance-Leahy Scale: Fair Sitting balance - Comments: pt able to sit EOB 2 min with and without UE support with minguard                                     Pertinent Vitals/Pain Pain Assessment: No/denies pain    Home Living Family/patient expects to be discharged to:: Private residence Living Arrangements: Spouse/significant other Available Help at Discharge: Family;Available PRN/intermittently;Personal care attendant;Available 24 hours/day Type of Home: House Home Access: Stairs to enter   [employer] of Steps: 2 Home Layout: One level Home Equipment: Walker - 2 wheels;Cane - single point;Bedside commode;Wheelchair - manual      Prior Function Level of Independence: Needs assistance   Gait / Transfers Assistance Needed: walks bed to bathroom daily with RW, typically unassisted, no longer walks to kitchen  ADL's / Homemaking Assistance Needed: sponge bathes with assist for dressing and bathing, family does the homemaking  Comments: Pt is a retired Careers adviser.     [name]        Extremity/Trunk Assessment   Upper Extremity Assessment Upper Extremity Assessment: Generalized weakness;Difficult to  assess due to impaired cognition    Lower Extremity Assessment Lower Extremity Assessment: Generalized weakness;Difficult to assess due to impaired cognition    Cervical / Trunk Assessment Cervical / Trunk Assessment: Kyphotic  Communication   Communication: HOH  Cognition Arousal/Alertness: Lethargic Behavior During Therapy: Flat affect Overall Cognitive Status: Impaired/Different from baseline Area of Impairment: Following commands                       Following Commands: Follows one step commands  inconsistently;Follows one step commands with increased time       General Comments: pt non-verbal throughout session, history provided by daughter, pt initially shaking head that he could get up but then returning to bed      General Comments      Exercises     Assessment/Plan    PT Assessment Patient needs continued PT services  PT Problem List Decreased strength;Decreased mobility;Decreased safety awareness;Decreased activity tolerance;Decreased range of motion;Decreased coordination;Decreased cognition;Decreased balance;Decreased knowledge of use of DME       PT Treatment Interventions DME instruction;Functional mobility training;Balance training;Patient/family education;Gait training;Therapeutic activities;Neuromuscular re-education;Therapeutic exercise;Cognitive remediation;Stair training    PT Goals (Current goals can be found in the Care Plan section)  Acute Rehab PT Goals Patient Stated Goal: return home PT Goal Formulation: With family Time For Goal Achievement: 06/06/18 Potential to Achieve Goals: Fair    Frequency Min 3X/week   Barriers to discharge   family home 24hrs and plan to also have hired caregivers 24hr/day    Co-evaluation               AM-PAC PT "6 Clicks" Daily Activity  Outcome Measure Difficulty turning over in bed (including adjusting bedclothes, sheets and blankets)?: A Little Difficulty moving from lying on back to sitting on the side of the bed? : Unable Difficulty sitting down on and standing up from a chair with arms (e.g., wheelchair, bedside commode, etc,.)?: Unable Help needed moving to and from a bed to chair (including a wheelchair)?: A Little Help needed walking in hospital room?: A Little Help needed climbing 3-5 steps with a railing? : A Lot 6 Click Score: 13    End of Session   Activity Tolerance: Patient limited by fatigue Patient left: in bed;with call bell/phone within reach;with family/visitor present Nurse  Communication: Mobility status PT Visit Diagnosis: Other abnormalities of gait and mobility (R26.89);Muscle weakness (generalized) (M62.81);Unsteadiness on feet (R26.81);Difficulty in walking, not elsewhere classified (R26.2)    Time: 7858-8502 PT Time Calculation (min) (ACUTE ONLY): 10 min   Charges:   PT Evaluation $PT Eval Moderate Complexity: 1 Mod          [place], PT Acute Rehabilitation Services Pager: [phone] Office: [phone]   Brison Fiumara B Linden Mikes 05/23/2018, 1:30 PM

## 2018-05-23 NOTE — Progress Notes (Signed)
[facility] Gastroenterology Progress Note  [name] 84 y.o. 01/22/1923   Subjective: [name] stool this morning per nursing. Daughter and caretaker in room.  Objective: Vital signs: Vitals:   05/23/18 0637 05/23/18 1149  BP: (!) 162/84 135/70  Pulse: 85 70  Resp:    Temp:    SpO2: 100%     Physical Exam: Gen: demented, lethargic, frail, hard of hearing, thin, no acute distress  HEENT: anicteric sclera CV: RRR Chest: CTA B Abd: diffuse tenderness with guarding, soft, nondistended, +BS Ext: no edema  Lab Results: Recent Labs    05/21/18 0655  NA 143  K 4.0  CL 117*  CO2 18*  GLUCOSE 195*  BUN 45*  CREATININE 2.76*  CALCIUM 8.7*   Recent Labs    05/21/18 0655  AST 22  ALT 13  ALKPHOS 49  BILITOT 0.7  PROT 5.0*  ALBUMIN 2.6*   Recent Labs    05/22/18 0421 05/23/18 0739  WBC 7.1 7.2  HGB 8.8* 8.5*  HCT 27.6* 25.3*  MCV 95.5 91.7  PLT 164 155      Assessment/Plan: Obscure GI bleed - question stercoral ulcer vs hemorrhoids vs diverticulosis. Capsule endoscopy showed two tiny nonbleeding small bowel AVMs. Patient has a history of chronic constipation and needs to resume Miralax at discharge and it will need to be titrated depending on his stools but crucial to avoid constipation as well as diarrhea. Would manage conservatively and not pursue a flex sig. Hgb stable. No further GI recs. No f/u with GI needed.   [name] 05/23/2018, 1:17 PM  Questions please call [phone] ID: [name], male   DOB: Jun 10, 1923, 84 y.o.   MRN: [id number]

## 2018-05-24 DIAGNOSIS — I10 Essential (primary) hypertension: Secondary | ICD-10-CM

## 2018-05-24 DIAGNOSIS — Z8679 Personal history of other diseases of the circulatory system: Secondary | ICD-10-CM

## 2018-05-24 DIAGNOSIS — R451 Restlessness and agitation: Secondary | ICD-10-CM

## 2018-05-24 LAB — CBC WITH DIFFERENTIAL/PLATELET
Abs Immature Granulocytes: 0.04 10*3/uL (ref 0.00–0.07)
BASOS ABS: 0 10*3/uL (ref 0.0–0.1)
BASOS PCT: 0 %
Eosinophils Absolute: 0 10*3/uL (ref 0.0–0.5)
Eosinophils Relative: 0 %
HCT: 24.5 % — ABNORMAL LOW (ref 39.0–52.0)
HEMOGLOBIN: 8 g/dL — AB (ref 13.0–17.0)
IMMATURE GRANULOCYTES: 1 %
LYMPHS PCT: 13 %
Lymphs Abs: 1 10*3/uL (ref 0.7–4.0)
MCH: 29.5 pg (ref 26.0–34.0)
MCHC: 32.7 g/dL (ref 30.0–36.0)
MCV: 90.4 fL (ref 80.0–100.0)
MONO ABS: 0.8 10*3/uL (ref 0.1–1.0)
Monocytes Relative: 10 %
NRBC: 0 % (ref 0.0–0.2)
Neutro Abs: 6 10*3/uL (ref 1.7–7.7)
Neutrophils Relative %: 76 %
PLATELETS: 181 10*3/uL (ref 150–400)
RBC: 2.71 MIL/uL — AB (ref 4.22–5.81)
RDW: 14.1 % (ref 11.5–15.5)
WBC: 7.9 10*3/uL (ref 4.0–10.5)

## 2018-05-24 LAB — GLUCOSE, CAPILLARY
GLUCOSE-CAPILLARY: 256 mg/dL — AB (ref 70–99)
GLUCOSE-CAPILLARY: 254 mg/dL — AB (ref 70–99)
GLUCOSE-CAPILLARY: 313 mg/dL — AB (ref 70–99)
Glucose-Capillary: 220 mg/dL — ABNORMAL HIGH (ref 70–99)

## 2018-05-24 LAB — CBC
HCT: 24.6 % — ABNORMAL LOW (ref 39.0–52.0)
HEMOGLOBIN: 7.8 g/dL — AB (ref 13.0–17.0)
MCH: 29.5 pg (ref 26.0–34.0)
MCHC: 31.7 g/dL (ref 30.0–36.0)
MCV: 93.2 fL (ref 80.0–100.0)
Platelets: 173 10*3/uL (ref 150–400)
RBC: 2.64 MIL/uL — AB (ref 4.22–5.81)
RDW: 14.2 % (ref 11.5–15.5)
WBC: 7.2 10*3/uL (ref 4.0–10.5)
nRBC: 0 % (ref 0.0–0.2)

## 2018-05-24 MED ORDER — PANTOPRAZOLE SODIUM 40 MG IV SOLR
40.0000 mg | Freq: Two times a day (BID) | INTRAVENOUS | Status: DC
  Administered 2018-05-24 – 2018-05-27 (×7): 40 mg via INTRAVENOUS
  Filled 2018-05-24 (×7): qty 40

## 2018-05-24 MED ORDER — SODIUM CHLORIDE 0.9 % IV SOLN
510.0000 mg | Freq: Once | INTRAVENOUS | Status: AC
  Administered 2018-05-24: 510 mg via INTRAVENOUS
  Filled 2018-05-24: qty 17

## 2018-05-24 MED ORDER — PANTOPRAZOLE SODIUM 40 MG PO TBEC: 40.0000 mg | DELAYED_RELEASE_TABLET | Freq: Two times a day (BID) | ORAL | Status: DC

## 2018-05-24 MED ORDER — FUROSEMIDE 10 MG/ML IJ SOLN
60.0000 mg | Freq: Once | INTRAMUSCULAR | Status: AC
  Administered 2018-05-24: 60 mg via INTRAVENOUS
  Filled 2018-05-24: qty 6

## 2018-05-24 MED ORDER — INSULIN GLARGINE 100 UNIT/ML ~~LOC~~ SOLN
10.0000 [IU] | Freq: Every day | SUBCUTANEOUS | Status: DC
  Administered 2018-05-24: 10 [IU] via SUBCUTANEOUS
  Filled 2018-05-24 (×2): qty 0.1

## 2018-05-24 MED ORDER — PANTOPRAZOLE SODIUM 40 MG PO PACK
40.0000 mg | PACK | Freq: Two times a day (BID) | ORAL | Status: DC
  Filled 2018-05-24: qty 20

## 2018-05-24 NOTE — Progress Notes (Signed)
Paged by RN about possible increased work of breathing and episodes of dropping blood pressure. Examined and evaluated Dr.Padovano at bedside with sitter present. He has minimal rales on exam and he appears to be slightly agitated on being examined. However he is observed saturrating 100% O2 on room air with minimal accessory muscle use. Per sitter, he has urinated large amounts this afternoon after being given 1 time does of IV lasix 60mg  today. He appears to be stable on my examination. Will monitor for now.

## 2018-05-24 NOTE — Progress Notes (Signed)
   Subjective: No acute events overnight. No obvious signs of bleeding over the last 24 hours.   Objective:  Vital signs in last 24 hours: Vitals:   05/23/18 1320 05/23/18 1515 05/23/18 2010 05/24/18 0442  BP:  (!) 159/62 (!) 166/80 (!) 166/63  Pulse: 82 73 70 75  Resp:   19 20  Temp:  98.2 F (36.8 C) 97.9 F (36.6 C) 98 F (36.7 C)  TempSrc:  Axillary  Oral  SpO2:      Weight:    67 kg  Height:       Physical Exam  Constitutional:  Thin appearing male lying in bed in no acute distress. He is agitated this morning and grunts with trying to examen him.   HENT:  Head: Normocephalic and atraumatic.  Eyes: EOM are normal.  Neck: Normal range of motion.  Cardiovascular: Normal rate and regular rhythm.  Pulmonary/Chest: Effort normal and breath sounds normal.  Abdominal: Soft. Bowel sounds are normal. He exhibits no distension. There is no tenderness.  Musculoskeletal:  Mild pedal edema bilaterally. Non-tender to palpation  Neurological: He is alert.  Skin: Skin is warm and dry.  Psychiatric:  Agitated today and noncommunicative    Assessment/Plan:  Active Problems:   Dementia (HCC)   Benign essential HTN   Chronic combined systolic (congestive) and diastolic (congestive) heart failure (HCC)   Acute GI bleeding   Coronary artery disease involving native coronary artery of native heart without angina pectoris   Acute blood loss anemia   AVM (arteriovenous malformation) of small bowel, acquired  Lower GI bleed with acute blood loss anemia:GI feels that bleeding is due to hemorrhoids vs stercoral ulcer vs diverticulosis.   Plan is to continue with conservative management.  Hb this morning stable has decreased but remains stable at 8.0 (yesterday 8.8). Will repeat H&H at 1700 today. 1.Will give IV Iron 510 mg today to replete stores 2. Continue protonixIVBID 3. Continue lactated ringers @ 25 mL/hr to keep intravenous access patent in case he has another large GI  bleed 4. Continue soft diet. Advance diet as tolerated 5. He will need to be on MiraLAX at discharge to soften the stool decreases likelihood of another bleed. 6. Continue PT  Dementia: 1.Continue 125 mgDepakote 2.Continue Seroquel 50 mg daily 3.Further meds require evaluation  History of HFrEF: Last EF on 2/18 showed EF 30 to 35%. He remains euvolemic today. 1.Continue to monitor  Polyuria:I believe it is appropriate to have this worked up as an outpatient or not at all. Labs to date have shown: UA showed 50 glucose and trace leukocytes, and Urine osmolality was 303.  1.Stict I+Os  Hypertension: His blood pressure has remains elevated with systolics of 496P to 591M. 1. Continue amlodipine 5 mg QD  Dispo: Anticipated discharge in approximately1-2days.  [name], MD 05/24/2018, 6:50 AM Pager: [phone]

## 2018-05-24 NOTE — Progress Notes (Addendum)
PT Cancellation Note  Patient Details Name: [name] MRN: [id number] DOB: [birth year]   Cancelled Treatment:    Reason Eval/Treat Not Completed: Other (comment). Per family, "he will be a much better candidate for cooperation this afternoon." Will follow-up for PT treatment this afternoon as schedule permits.  1:54pm: Attempted PT treatment session again. Pt attempting to have BM, offered assist with this and OOB mobility. Per daughter, "He isn't up for it today." Requesting PT check back in tomorrow. Will follow-up.  Mabeline Caras, PT, DPT Acute Rehabilitation Services  Pager [phone] Office [place] 05/24/2018, 9:55 AM

## 2018-05-24 NOTE — Progress Notes (Signed)
[facility] Progress Note  [name] 84 y.o. 11-08-1922   Subjective: No BMs overnight. Private nurse in room and spoke to daughter, [name] by phone.  Objective: Vital signs: Vitals:   05/23/18 2010 05/24/18 0442  BP: (!) 166/80 (!) 166/63  Pulse: 70 75  Resp: 19 20  Temp: 97.9 F (36.6 C) 98 F (36.7 C)  SpO2:      Physical Exam: Gen: demented, lethargic, elderly, frail, thin, no acute distress  CV: RRR Chest: CTA B Abd: diffuse tenderness with guarding, soft, nondistended, +BS Ext: no edema  Lab Results: No results for input(s): NA, K, CL, CO2, GLUCOSE, BUN, CREATININE, CALCIUM, MG, PHOS in the last 72 hours. No results for input(s): AST, ALT, ALKPHOS, BILITOT, PROT, ALBUMIN in the last 72 hours. Recent Labs    05/23/18 0739 05/24/18 0549  WBC 7.2 7.9  NEUTROABS  --  6.0  HGB 8.5* 8.0*  HCT 25.3* 24.5*  MCV 91.7 90.4  PLT 155 181      Assessment/Plan: GI bleed - Hgb 8.0. No further rectal bleeding seen. Manage conservatively. Daughter requesting repeat CBC at 1300 (order placed). Will sign off. Call if questions.   [name] 05/24/2018, 9:06 AM  Questions please call [phone] ID: [name], male   DOB: 01-Oct-1922, 84 y.o.   MRN: [id number]

## 2018-05-24 NOTE — Progress Notes (Signed)
Evaluated patient at bedside due to some concerns of increased work of breathing.  Patient does have a mild increase in his work of breathing compared with this morning.  He has fine rales in the RLL, Left side clear.  His JVP is elevated slightly above his baseline (has TR).  He is currently 100% O2 saturation on room air.  He has no LE edema.  He is uncomfortable laying flat per his daughter.  I will treat with IV lasix 60mg  to see if this improves his breathing and comfort.  We have stopped IV fluids.

## 2018-05-25 ENCOUNTER — Inpatient Hospital Stay (HOSPITAL_COMMUNITY): Payer: Medicare Other

## 2018-05-25 DIAGNOSIS — I5023 Acute on chronic systolic (congestive) heart failure: Secondary | ICD-10-CM

## 2018-05-25 LAB — COMPREHENSIVE METABOLIC PANEL
ALBUMIN: 2.8 g/dL — AB (ref 3.5–5.0)
ALT: 26 U/L (ref 0–44)
AST: 23 U/L (ref 15–41)
Alkaline Phosphatase: 124 U/L (ref 38–126)
Anion gap: 10 (ref 5–15)
BUN: 50 mg/dL — AB (ref 8–23)
CO2: 22 mmol/L (ref 22–32)
CREATININE: 3.53 mg/dL — AB (ref 0.61–1.24)
Calcium: 9.1 mg/dL (ref 8.9–10.3)
Chloride: 107 mmol/L (ref 98–111)
GFR calc Af Amer: 16 mL/min — ABNORMAL LOW (ref >60)
GFR calc non Af Amer: 14 mL/min — ABNORMAL LOW (ref >60)
Glucose, Bld: 243 mg/dL — ABNORMAL HIGH (ref 70–99)
Potassium: 4.2 mmol/L (ref 3.5–5.1)
SODIUM: 139 mmol/L (ref 135–145)
Total Bilirubin: 0.8 mg/dL (ref 0.3–1.2)
Total Protein: 6.1 g/dL — ABNORMAL LOW (ref 6.5–8.1)

## 2018-05-25 LAB — CBC
HEMATOCRIT: 24 % — AB (ref 39.0–52.0)
Hemoglobin: 7.9 g/dL — ABNORMAL LOW (ref 13.0–17.0)
MCH: 30.3 pg (ref 26.0–34.0)
MCHC: 32.9 g/dL (ref 30.0–36.0)
MCV: 92 fL (ref 80.0–100.0)
Platelets: 173 10*3/uL (ref 150–400)
RBC: 2.61 MIL/uL — ABNORMAL LOW (ref 4.22–5.81)
RDW: 13.9 % (ref 11.5–15.5)
WBC: 6.9 10*3/uL (ref 4.0–10.5)
nRBC: 0.4 % — ABNORMAL HIGH (ref 0.0–0.2)

## 2018-05-25 LAB — GLUCOSE, CAPILLARY
GLUCOSE-CAPILLARY: 283 mg/dL — AB (ref 70–99)
Glucose-Capillary: 241 mg/dL — ABNORMAL HIGH (ref 70–99)
Glucose-Capillary: 227 mg/dL — ABNORMAL HIGH (ref 70–99)
Glucose-Capillary: 188 mg/dL — ABNORMAL HIGH (ref 70–99)

## 2018-05-25 MED ORDER — FUROSEMIDE 10 MG/ML IJ SOLN
40.0000 mg | Freq: Once | INTRAMUSCULAR | Status: AC
  Administered 2018-05-25: 40 mg via INTRAVENOUS
  Filled 2018-05-25: qty 4

## 2018-05-25 MED ORDER — INSULIN GLARGINE 100 UNIT/ML ~~LOC~~ SOLN
12.0000 [IU] | Freq: Every day | SUBCUTANEOUS | Status: DC
  Filled 2018-05-25 (×3): qty 0.12

## 2018-05-25 NOTE — Progress Notes (Signed)
Inpatient Diabetes Program Recommendations  AACE/ADA: New Consensus Statement on Inpatient Glycemic Control (2015)  Target Ranges:  Prepandial:   less than 140 mg/dL      Peak postprandial:   less than 180 mg/dL (1-2 hours)      Critically ill patients:  140 - 180 mg/dL   Results for KAZI, REPPOND (MRN [id number]) as of 05/25/2018 11:02  Ref. Range 05/24/2018 08:03 05/24/2018 12:03 05/24/2018 16:57 05/24/2018 21:24 05/25/2018 08:12  Glucose-Capillary Latest Ref Range: 70 - 99 mg/dL 256 (H) 254 (H) 313 (H) 220 (H) 241 (H)   Review of Glycemic Control  Diabetes history: DM 2 Outpatient Diabetes medications: Lantus 6-18 units based on SSI, Tradjenta 7.5 mg Daily Current orders for Inpatient glycemic control: Lantus 10 units qhs, Novolog 0-9 units tid  Inpatient Diabetes Program Recommendations:    Glucose trend above inpatient goal in the 200's the past few days on Latus 10 units. Consider increasing Lantus to 14 units if patient is not discharged.  Thanks,  Tama Headings RN, MSN, BC-ADM Inpatient Diabetes Coordinator Team Pager [phone] (8a-5p)

## 2018-05-25 NOTE — Progress Notes (Signed)
Pt respirations and work of breathing increased. Pts o2 sats 99%, BP 136/62, HR 69, fine crackles right lower lobe. MD paged, MD at bedside. No orders received, will continue to monitor pts closely.

## 2018-05-25 NOTE — Progress Notes (Signed)
PT Cancellation Note  Patient Details Name: DANIIL LABARGE MRN: [id number] DOB: 1922/08/20   Cancelled Treatment:    Reason Eval/Treat Not Completed: Other (comment). Family, caregiver and nursing currently attempting to give pt medications. Daughter requesting PT check back later this afternoon. Will follow-up as schedule permits.   Mabeline Caras, PT, DPT Acute Rehabilitation Services  Pager [phone] Office [place] 05/25/2018, 10:35 AM

## 2018-05-25 NOTE — Progress Notes (Signed)
   Subjective: Mr. Trompeter continues to be noncommunicative and agitated with examination.  His daughter is in the room this morning and states that he has been more short of breath since yesterday afternoon.  She does state that the 60 mg of Lasix that he was given did help him pee out a lot of fluid.  She does think that he is still " fluid overload".  To her knowledge he has not had any frank blood in his bowel movements.  Objective:  Vital signs in last 24 hours: Vitals:   05/25/18 0454 05/25/18 0821 05/25/18 1408 05/25/18 1452  BP: 138/62 (!) 153/64 (!) 147/67 (!) 142/69  Pulse: 66 65 72 68  Resp: (!) 22  (!) 25 (!) 26  Temp: 97.9 F (36.6 C) 98.4 F (36.9 C) 98 F (36.7 C) 98.1 F (36.7 C)  TempSrc: Axillary Axillary Oral Oral  SpO2: 96% 96% 94% 95%  Weight:      Height:       Physical Exam  Constitutional:  Cachectic appearing male laying in bed in no acute distress.  HENT:  Head: Normocephalic and atraumatic.  Eyes: EOM are normal.  Neck: Normal range of motion.  Cardiovascular: Normal rate and regular rhythm.  Pulmonary/Chest:  Faint crackles heard at the lung bases.  Increased work of breathing.  Abdominal: Soft. Bowel sounds are normal. He exhibits no distension. There is no tenderness.  Musculoskeletal: He exhibits no edema or tenderness.  Neurological: He is alert.  Skin: Skin is warm and dry.  Psychiatric: He has a normal mood and affect. His behavior is normal.  Nursing note and vitals reviewed.   Assessment/Plan:  Active Problems:   Dementia (HCC)   Benign essential HTN   Chronic combined systolic (congestive) and diastolic (congestive) heart failure (HCC)   Acute GI bleeding   Coronary artery disease involving native coronary artery of native heart without angina pectoris   Acute blood loss anemia   AVM (arteriovenous malformation) of small bowel, acquired   Lower GI bleed with acute blood loss anemia:GI feels that bleeding is due to hemorrhoids  vs stercoral ulcer vs diverticulosis.  Plan is to continue with conservative management.  Hb this morningstable is decreased but remains stable at 7.9(yesterday 7.8).  If his hemoglobin continues to downtrend we will consider giving a unit of blood. 1. Continue protonixIVBID 2. Continuesoftdiet. Advance diet as tolerated 3.He will need to be on MiraLAX at discharge to soften the stool decreases likelihood of another bleed. 4. Continue PT   Dementia: 1.Continue 125 mgDepakote 2.Continue Seroquel 50 mg daily 3.Further meds require evaluation  History of HFrEF: Last EF on 2/18 showed EF 30 to 35%. He does seem to be retaining some fluid today.  He received 60 mg of Lasix yesterday. Chest x-ray ordered this morning showed bibasilar pulmonary edema. 1.Ordered Lasix 40 mg IV  2. Stict I+Os  Chronic kidney disease: Creatinine increased to 3.53 from 2.76.  This is likely due to the use of Lasix. 1.  Continue to monitor  Hypertension:His blood pressure is closer to an acceptable range with systolics of 741S to 239R. 1. Continue amlodipine 5 mg QD  Dispo: Anticipated discharge in approximately1-2days.  [name], MD 05/25/2018, 4:04 PM Pager: [phone]

## 2018-05-26 DIAGNOSIS — R0602 Shortness of breath: Secondary | ICD-10-CM

## 2018-05-26 LAB — COMPREHENSIVE METABOLIC PANEL
ALT: 31 U/L (ref 0–44)
AST: 28 U/L (ref 15–41)
Albumin: 2.8 g/dL — ABNORMAL LOW (ref 3.5–5.0)
Alkaline Phosphatase: 128 U/L — ABNORMAL HIGH (ref 38–126)
Anion gap: 11 (ref 5–15)
BILIRUBIN TOTAL: 0.9 mg/dL (ref 0.3–1.2)
BUN: 54 mg/dL — ABNORMAL HIGH (ref 8–23)
CHLORIDE: 106 mmol/L (ref 98–111)
CO2: 23 mmol/L (ref 22–32)
Calcium: 9.4 mg/dL (ref 8.9–10.3)
Creatinine, Ser: 3.58 mg/dL — ABNORMAL HIGH (ref 0.61–1.24)
GFR, EST AFRICAN AMERICAN: 16 mL/min — AB (ref >60)
GFR, EST NON AFRICAN AMERICAN: 14 mL/min — AB (ref >60)
Glucose, Bld: 282 mg/dL — ABNORMAL HIGH (ref 70–99)
POTASSIUM: 4.3 mmol/L (ref 3.5–5.1)
Sodium: 140 mmol/L (ref 135–145)
TOTAL PROTEIN: 5.9 g/dL — AB (ref 6.5–8.1)

## 2018-05-26 LAB — PREPARE RBC (CROSSMATCH)

## 2018-05-26 LAB — CBC
HCT: 24.7 % — ABNORMAL LOW (ref 39.0–52.0)
Hemoglobin: 8 g/dL — ABNORMAL LOW (ref 13.0–17.0)
MCH: 30.1 pg (ref 26.0–34.0)
MCHC: 32.4 g/dL (ref 30.0–36.0)
MCV: 92.9 fL (ref 80.0–100.0)
Platelets: 190 10*3/uL (ref 150–400)
RBC: 2.66 MIL/uL — ABNORMAL LOW (ref 4.22–5.81)
RDW: 14.3 % (ref 11.5–15.5)
WBC: 6.6 10*3/uL (ref 4.0–10.5)
nRBC: 1.2 % — ABNORMAL HIGH (ref 0.0–0.2)

## 2018-05-26 LAB — GLUCOSE, CAPILLARY
GLUCOSE-CAPILLARY: 99 mg/dL (ref 70–99)
Glucose-Capillary: 208 mg/dL — ABNORMAL HIGH (ref 70–99)
Glucose-Capillary: 98 mg/dL (ref 70–99)

## 2018-05-26 MED ORDER — FERROUS SULFATE 325 (65 FE) MG PO TABS
325.0000 mg | ORAL_TABLET | Freq: Three times a day (TID) | ORAL | Status: DC
  Administered 2018-05-26 – 2018-05-27 (×3): 325 mg via ORAL
  Filled 2018-05-26 (×3): qty 1

## 2018-05-26 MED ORDER — SODIUM CHLORIDE 0.9% IV SOLUTION: Freq: Once | INTRAVENOUS | Status: DC

## 2018-05-26 MED ORDER — SENNOSIDES-DOCUSATE SODIUM 8.6-50 MG PO TABS: 2.0000 | ORAL_TABLET | Freq: Every evening | ORAL | Status: DC | PRN

## 2018-05-26 MED ORDER — INSULIN ASPART 100 UNIT/ML ~~LOC~~ SOLN
0.0000 [IU] | Freq: Three times a day (TID) | SUBCUTANEOUS | Status: DC
  Administered 2018-05-26: 5 [IU] via SUBCUTANEOUS
  Administered 2018-05-27: 8 [IU] via SUBCUTANEOUS

## 2018-05-26 MED ORDER — DARBEPOETIN ALFA 40 MCG/0.4ML IJ SOSY
40.0000 ug | PREFILLED_SYRINGE | INTRAMUSCULAR | Status: DC
  Administered 2018-05-26: 40 ug via SUBCUTANEOUS
  Filled 2018-05-26 (×4): qty 0.4

## 2018-05-26 MED ORDER — INSULIN ASPART 100 UNIT/ML ~~LOC~~ SOLN: 0.0000 [IU] | Freq: Every day | SUBCUTANEOUS | Status: DC

## 2018-05-26 MED ORDER — FUROSEMIDE 10 MG/ML IJ SOLN
40.0000 mg | Freq: Once | INTRAMUSCULAR | Status: AC
  Administered 2018-05-26: 40 mg via INTRAVENOUS
  Filled 2018-05-26: qty 4

## 2018-05-26 NOTE — Progress Notes (Signed)
Physical Therapy Treatment Patient Details Name: [name] MRN: [id number] DOB: [birth year] Today's Date: 05/26/2018    History of Present Illness 84 yo admitted with GIB and anemia. PMHx: dementia, HF, diverticulosis, CAD, CABG, pacemaker, DM, CKD, prostate CA    PT Comments    Family encouraged pt to participate.  Pt would agree to mobilize however then become resistant to activity.  Pt assisted to standing twice however deferred ambulation as pt resistant and fall risk.  Pt appears to require assist however possibly due more to resistance than weakness.  Family and caregiver reports pt is able to ambulate short distance if he feels willing however only performs tasks as he desires.  Will need initial assist for mobility upon d/c likely as he has not been mobilizing in hospital setting.  Pt to have 24/7 caregivers upon d/c home however if they are unable to assist, pt may need SNF.    Follow Up Recommendations  Supervision/Assistance - 24 hour;Home health PT     Equipment Recommendations  Hospital bed    Recommendations for Other Services       Precautions / Restrictions Precautions Precautions: Fall    Mobility  Bed Mobility Overal bed mobility: Needs Assistance Bed Mobility: Supine to Sit;Sit to Supine     Supine to sit: Min assist;Max assist Sit to supine: Min assist   General bed mobility comments: variable assist levels depending on pt participation, pt able to return to bed without assist when he wishes, somewhat resistant at times to mobility despite intiating cued task  Transfers Overall transfer level: Needs assistance Equipment used: Rolling walker (2 wheeled) Transfers: Sit to/from Stand Sit to Stand: Mod assist;+2 safety/equipment;+2 physical assistance         General transfer comment: pt agreeable to stand and then becomes resistant to ambulation once standing and returns to sitting, pt encouraged to stand once more and then pt returned to  supine; requiring assist to rise more likely due to resistance than weakness as pt able to static stand and somewhat control descent upon his return to sitting  Ambulation/Gait             General Gait Details: pt refused   Stairs             Wheelchair Mobility    Modified Rankin (Stroke Patients Only)       Balance Overall balance assessment: Needs assistance         Standing balance support: Bilateral upper extremity supported Standing balance-Leahy Scale: Poor Standing balance comment: requires UE support, able to static stand min/guard once upright however reliant on UE support                            Cognition Arousal/Alertness: Awake/alert Behavior During Therapy: Flat affect Overall Cognitive Status: Impaired/Different from baseline Area of Impairment: Following commands                       Following Commands: Follows one step commands inconsistently;Follows one step commands with increased time       General Comments: pt non-verbal throughout session, history provided by daughter, daughter and caregiver report pt is mobile when he prefers and typically does not move if he does not feel like it      Exercises      General Comments        Pertinent Vitals/Pain Pain Assessment: Faces Faces Pain Scale: No hurt  Home Living                      Prior Function            PT Goals (current goals can now be found in the care plan section) Progress towards PT goals: Progressing toward goals    Frequency    Min 3X/week      PT Plan Current plan remains appropriate    Co-evaluation              AM-PAC PT "6 Clicks" Daily Activity  Outcome Measure  Difficulty turning over in bed (including adjusting bedclothes, sheets and blankets)?: A Little Difficulty moving from lying on back to sitting on the side of the bed? : Unable Difficulty sitting down on and standing up from a chair with arms  (e.g., wheelchair, bedside commode, etc,.)?: Unable Help needed moving to and from a bed to chair (including a wheelchair)?: Total Help needed walking in hospital room?: Total Help needed climbing 3-5 steps with a railing? : Total 6 Click Score: 8    End of Session Equipment Utilized During Treatment: Gait belt Activity Tolerance: Other (comment)(pt/cognition self limiting) Patient left: in bed;with call bell/phone within reach;with family/visitor present Nurse Communication: (NT called for changing linen (pt wearing depends and needs to be changed as well)) PT Visit Diagnosis: Unsteadiness on feet (R26.81);Difficulty in walking, not elsewhere classified (R26.2)     Time: 7680-8811 PT Time Calculation (min) (ACUTE ONLY): 20 min  Charges:  $Therapeutic Activity: 8-22 mins                    Carmelia Bake, PT, DPT Acute Rehabilitation Services Office: [phone] Pager: [phone]  Trena Platt 05/26/2018, 4:05 PM

## 2018-05-26 NOTE — Care Management Important Message (Signed)
Important Message  Patient Details  Name: [name] MRN: [id number] Date of Birth: 1923/01/21   Medicare Important Message Given:  Yes    Eain Mullendore 05/26/2018, 11:45 AM

## 2018-05-26 NOTE — Progress Notes (Addendum)
   Subjective: [name] is much more responsive this morning and says that he is doing well.  He is unable to answer any other review of systems questions. Per his caregiver who is in the room he had the best sleep that he has had since he has been here.  His shortness of breath is much improved since giving him Lasix yesterday.  He is overall doing well.  Objective:  Vital signs in last 24 hours: Vitals:   05/25/18 0821 05/25/18 1408 05/25/18 1452 05/25/18 2100  BP: (!) 153/64 (!) 147/67 (!) 142/69 (!) 150/70  Pulse: 65 72 68 64  Resp:  (!) 25 (!) 26 (!) 21  Temp: 98.4 F (36.9 C) 98 F (36.7 C) 98.1 F (36.7 C) 97.6 F (36.4 C)  TempSrc: Axillary Oral Oral Axillary  SpO2: 96% 94% 95% 100%  Weight:      Height:       Physical Exam  Constitutional: He appears well-developed and well-nourished.  HENT:  Head: Normocephalic and atraumatic.  Eyes: EOM are normal.  Neck: Normal range of motion.  Cardiovascular: Normal rate, regular rhythm and normal heart sounds.  Pulmonary/Chest: Effort normal and breath sounds normal.  Clear to auscultation bilaterally  Abdominal: Soft. Bowel sounds are normal. He exhibits no distension. There is no tenderness.  Musculoskeletal: He exhibits no edema or tenderness.  Neurological: He is alert.  Skin: Skin is warm and dry.  Psychiatric:  He is more communicative today and states that he is doing well.  He is unable to answer any other review of systems questions.  Nursing note and vitals reviewed.   Assessment/Plan:  Active Problems:   Dementia (HCC)   Benign essential HTN   Chronic combined systolic (congestive) and diastolic (congestive) heart failure (HCC)   Acute GI bleeding   Coronary artery disease involving native coronary artery of native heart without angina pectoris   Acute blood loss anemia   AVM (arteriovenous malformation) of small bowel, acquired  Lower GI bleed with acute blood loss anemia:GI feels that bleeding is due  tohemorrhoids vs stercoral ulcer vs diverticulosis.Plan is to continue with conservative management. Hb this morningremained stable at 8.0 (yesterday 7.9).  If he continues to remain stable throughout the day I believe that he will be ready for discharge today. 1.Continue protonixIVBID; we will switch him over to oral PPI during discharge. 2. Continuesoftdiet. Advance diet as tolerated 3.He will need to be on MiraLAX at discharge to soften the stool decreases likelihood of another bleed. 4.Continue PT   Dementia: 1.Continue 125 mgDepakote 2.Continue Seroquel 50 mg daily 3.Further meds require evaluation  Acute on Chronic Systolic Heart Failure: Last EF on 2/18 showed EF 30 to 35%. He was given 40 mg of IV Lasix yesterday which seemed to have improved his shortness of breath. 1.Continue monitoring 2. Stict I+Os  Chronic kidney disease Stage IV: Creatinine increased yesterday to 3.53 from 2.76.  Today's CMP pending 1.    Follow-up CMP  Hypertension:Blood pressure within an acceptable range 1.Continueamlodipine5mg  QD  Dispo: Anticipated discharge today if he remains stable.  [name], MD 05/26/2018, 6:51 AM Pager: [phone]

## 2018-05-26 NOTE — Progress Notes (Addendum)
MEDICATION RELATED CONSULT NOTE - INITIAL   Pharmacy Consult for Aranesp Indication: Anemia of CKD  Allergies  Allergen Reactions  . Iodides Anaphylaxis and Other (See Comments)  . Iodinated Diagnostic Agents Anaphylaxis  . Iohexol Anaphylaxis     Desc: RN states pt stated he had anyphalactic shock reaction to contrast approx 10 years ago.   . Other Shortness Of Breath  . Ace Inhibitors Other (See Comments)    cough  . Simvastatin Other (See Comments)    Muscle aches  . Aspirin     Other reaction(s): Abdominal Pain    Patient Measurements: Height: 5\' 11"  (180.3 cm) Weight: 147 lb 11.3 oz (67 kg) IBW/kg (Calculated) : 75.3 Adjusted Body Weight:   Vital Signs: Temp: 98.5 F (36.9 C) (10/25 0600) Temp Source: Axillary (10/25 0600) BP: 148/63 (10/25 0838) Pulse Rate: 71 (10/25 0600) Intake/Output from previous day: 10/24 0701 - 10/25 0700 In: 951 [P.O.:951] Out: -  Intake/Output from this shift: Total I/O In: 240 [P.O.:240] Out: -   Labs: Recent Labs    05/24/18 1125 05/25/18 0533 05/25/18 0539 05/26/18 0557  WBC 7.2  --  6.9 6.6  HGB 7.8*  --  7.9* 8.0*  HCT 24.6*  --  24.0* 24.7*  PLT 173  --  173 190  CREATININE  --  3.53*  --  3.58*  ALBUMIN  --  2.8*  --  2.8*  PROT  --  6.1*  --  5.9*  AST  --  23  --  28  ALT  --  26  --  31  ALKPHOS  --  124  --  128*  BILITOT  --  0.8  --  0.9   Estimated Creatinine Clearance: 12.5 mL/min (A) (by C-G formula based on SCr of 3.58 mg/dL (H)).   Microbiology: No results found for this or any previous visit (from the past 720 hour(s)).  Medical History: Past Medical History:  Diagnosis Date  . CAD (coronary artery disease)    a. CABG 1991 and 2006 LIMA LAD, seq. SVG D1-Cfx, SVG d2,and seq. SVG AM-PDA   . Chronic systolic CHF (congestive heart failure) ([place])   . CKD (chronic kidney disease)   . Dementia ([place])   . Depression   . Diabetes mellitus   . Diverticulitis   . GI bleeding 05/11/2018  . HTN  (hypertension)   . Left leg DVT ([place])   . Presence of permanent cardiac pacemaker   . Prostate cancer ([place])   . Tachycardia-bradycardia syndrome ([place])     Assessment:  Aranesp 40 mcg weekly (Fri) for anemia of CKD * Outpatient ESA/iron orders: "Iron" one tablet daily * Received 1 dose of Feraheme 510mg  on 10/23. * Last Tsat ___ and ferritin ___ per ____ on ____ - FESO4 325mg  TID ordered for replacement * Last doses of Aranesp given on (10/25--f/u charting) * Hgb on 10/25, next dose due Fridays.  Goal of Therapy:  Normalization of Hgb  Plan:  Protonix IV to PO when able Aranesp 28mcg SQ Fridays. Need to f/u iron panel results in case we need another dose of IV iron.(last dose 10/23) (next due next Wed) FESO4 1 tabs TID with meals. Iron studies tomorrow AM  Brennyn Ortlieb S. Alford Highland, PharmD, BCPS Clinical Staff Pharmacist [place] 05/26/2018,12:31 PM

## 2018-05-27 DIAGNOSIS — Z91041 Radiographic dye allergy status: Secondary | ICD-10-CM

## 2018-05-27 DIAGNOSIS — H9190 Unspecified hearing loss, unspecified ear: Secondary | ICD-10-CM

## 2018-05-27 DIAGNOSIS — Z886 Allergy status to analgesic agent status: Secondary | ICD-10-CM

## 2018-05-27 DIAGNOSIS — Z888 Allergy status to other drugs, medicaments and biological substances status: Secondary | ICD-10-CM

## 2018-05-27 DIAGNOSIS — D631 Anemia in chronic kidney disease: Secondary | ICD-10-CM

## 2018-05-27 DIAGNOSIS — Z951 Presence of aortocoronary bypass graft: Secondary | ICD-10-CM

## 2018-05-27 DIAGNOSIS — N179 Acute kidney failure, unspecified: Secondary | ICD-10-CM

## 2018-05-27 LAB — TYPE AND SCREEN
ABO/RH(D): AB POS
ANTIBODY SCREEN: NEGATIVE
UNIT DIVISION: 0

## 2018-05-27 LAB — RETICULOCYTES
Immature Retic Fract: 33.2 % — ABNORMAL HIGH (ref 2.3–15.9)
RBC.: 3.56 MIL/uL — AB (ref 4.22–5.81)
RETIC CT PCT: 4.6 % — AB (ref 0.4–3.1)
Retic Count, Absolute: 163.8 10*3/uL (ref 19.0–186.0)

## 2018-05-27 LAB — CBC
HEMATOCRIT: 32.6 % — AB (ref 39.0–52.0)
HEMOGLOBIN: 10.6 g/dL — AB (ref 13.0–17.0)
MCH: 29.8 pg (ref 26.0–34.0)
MCHC: 32.5 g/dL (ref 30.0–36.0)
MCV: 91.6 fL (ref 80.0–100.0)
NRBC: 1.4 % — AB (ref 0.0–0.2)
Platelets: UNDETERMINED 10*3/uL (ref 150–400)
RBC: 3.56 MIL/uL — ABNORMAL LOW (ref 4.22–5.81)
RDW: 15.3 % (ref 11.5–15.5)
WBC: 8 10*3/uL (ref 4.0–10.5)

## 2018-05-27 LAB — COMPREHENSIVE METABOLIC PANEL
ALT: 34 U/L (ref 0–44)
AST: 35 U/L (ref 15–41)
Albumin: 3.1 g/dL — ABNORMAL LOW (ref 3.5–5.0)
Alkaline Phosphatase: 114 U/L (ref 38–126)
Anion gap: 11 (ref 5–15)
BUN: 53 mg/dL — AB (ref 8–23)
CO2: 23 mmol/L (ref 22–32)
Calcium: 9.4 mg/dL (ref 8.9–10.3)
Chloride: 111 mmol/L (ref 98–111)
Creatinine, Ser: 3.61 mg/dL — ABNORMAL HIGH (ref 0.61–1.24)
GFR calc Af Amer: 15 mL/min — ABNORMAL LOW (ref >60)
GFR, EST NON AFRICAN AMERICAN: 13 mL/min — AB (ref >60)
Glucose, Bld: 93 mg/dL (ref 70–99)
Potassium: 3.8 mmol/L (ref 3.5–5.1)
SODIUM: 145 mmol/L (ref 135–145)
Total Bilirubin: 1.1 mg/dL (ref 0.3–1.2)
Total Protein: 6.6 g/dL (ref 6.5–8.1)

## 2018-05-27 LAB — IRON AND TIBC
IRON: UNDETERMINED ug/dL (ref 45–182)
Saturation Ratios: UNDETERMINED % (ref 17.9–39.5)
TIBC: UNDETERMINED ug/dL (ref 250–450)
UIBC: UNDETERMINED ug/dL

## 2018-05-27 LAB — BPAM RBC
BLOOD PRODUCT EXPIRATION DATE: 201911082359
ISSUE DATE / TIME: 201910252318
Unit Type and Rh: 8400

## 2018-05-27 LAB — GLUCOSE, CAPILLARY
Glucose-Capillary: 254 mg/dL — ABNORMAL HIGH (ref 70–99)
Glucose-Capillary: 110 mg/dL — ABNORMAL HIGH (ref 70–99)

## 2018-05-27 LAB — VITAMIN B12: Vitamin B-12: 2543 pg/mL — ABNORMAL HIGH (ref 180–914)

## 2018-05-27 LAB — FOLATE: Folate: 17.1 ng/mL (ref >5.9)

## 2018-05-27 MED ORDER — FUROSEMIDE 40 MG PO TABS: 40.0000 mg | ORAL_TABLET | Freq: Every day | ORAL | 0 refills | Status: AC | PRN

## 2018-05-27 MED ORDER — PANTOPRAZOLE SODIUM 40 MG PO TBEC: 40.0000 mg | DELAYED_RELEASE_TABLET | Freq: Two times a day (BID) | ORAL | 0 refills | Status: AC

## 2018-05-27 MED ORDER — AMLODIPINE BESYLATE 5 MG PO TABS: 5.0000 mg | ORAL_TABLET | Freq: Every day | ORAL | 0 refills | Status: AC

## 2018-05-27 MED ORDER — DARBEPOETIN ALFA 40 MCG/0.4ML IJ SOSY: 40.0000 ug | PREFILLED_SYRINGE | INTRAMUSCULAR | Status: AC

## 2018-05-27 NOTE — Progress Notes (Signed)
Subjective: Per his daughter [name] seems more alert today.  He also has had minimal shortness of breath since getting Lasix and 1 unit of blood yesterday.  She is however concerned about sending him home today and the difficulty of transporting him from the car to their home.  She was also concerned about him having to wake up several times throughout the night to pee.  We suggested that he use a urinal. [name] does seem to be in stable medical condition and both sisters expressed agreement with discharging him home today.  Objective:  Vital signs in last 24 hours: Vitals:   05/27/18 0030 05/27/18 0200 05/27/18 0345 05/27/18 0500  BP:   (!) 163/83   Pulse:   65   Resp: (!) 23 (!) 26 20   Temp:   98.4 F (36.9 C)   TempSrc:   Oral   SpO2:   98%   Weight:    66.3 kg  Height:       Physical Exam  Constitutional:  Thin appearing male lying in bed in no acute distress.  HENT:  Head: Normocephalic and atraumatic.  Eyes: EOM are normal.  Cardiovascular: Normal rate and regular rhythm.  Pulmonary/Chest:  Mildly tachypneic with normal breath sounds bilaterally.  He is saturating at 100% on room air.  Abdominal: Soft. Bowel sounds are normal. He exhibits no distension. There is no tenderness.  Musculoskeletal: He exhibits no edema or tenderness.  Neurological: He is alert.  He is a bit more communicative today.  He is able to say that he is doing well today and asked me how I am doing.  Skin: Skin is warm and dry.  Psychiatric:  Calm today  Nursing note and vitals reviewed.  Assessment/Plan:  Active Problems:   Dementia (HCC)   Benign essential HTN   Chronic combined systolic (congestive) and diastolic (congestive) heart failure (HCC)   Acute GI bleeding   Coronary artery disease involving native coronary artery of native heart without angina pectoris   Acute blood loss anemia   AVM (arteriovenous malformation) of small bowel, acquired   Shortness of breath  Lower  GI bleed with acute blood loss anemia:GI feels that bleeding is due tohemorrhoids vs stercoral ulcer vs diverticulosis.Plan is to continue with conservative management. He received 1 unit of packed red blood cells and Aranesp 40 mcg injection yesterday.  His hemoglobin has improved to 10.6 and his reticulocyte count is 4.6 suggesting that he is appropriately making new red blood cells.  It does appear that his GI bleed has significantly slowed and it is appropriate to continue conservative management with p.o. pantoprazole.  He will be discharged today and will need close follow-up with his primary care doctor to repeat CBC. - Discharge home today with oral pantoprazole 40 mg twice daily - Advised to take MiraLAX to soften the stool decreasing likelihood of another bleed. - Continue PT at discharge   Acute on Chronic Systolic Heart Failure: He appears euvolemic on exam today.  He is 0.2 kg up from the 20th (6 days ago).  He has had very brief and transient episodes of tachypnea but this appears more to be from agitation and not from fluid overload.  I will discharge him today on Lasix 40 mg p.o. daily as needed for heart failure symptoms (shortness of breath and weight gain).  He will need to follow-up with his primary care doctor to reassess his volume status.  Acute on chronic kidney disease Stage JJ:HERDEYCXKG  increased today to 3.61 but remains relatively stable over the last several days.   This is likely secondary to continued Lasix use.  Will need a repeat BMP at his follow-up visit.  Dementia: 1.Continue 125 mgDepakote at home 2.Continue Seroquel 50 mg daily home  Hypertension:Blood pressure remained elevated but within an acceptable range 1.Continueamlodipine5mg  QD 2.  Restart home by Bidil 20-37.5 mg 3 times daily  Dispo: Anticipated discharge today if he remains stable.  [name], MD 05/27/2018, 6:55 AM Pager: [phone]

## 2018-05-27 NOTE — Discharge Summary (Signed)
Name: [name] MRN: [id number] DOB: 1923/04/16 84 y.o. PCP: Paul Poll, MD  Date of Admission: 05/16/2018  4:00 AM Date of Discharge: 05/27/2018 Attending Physician: Paul Dresser, MD  Discharge Diagnosis: 1. Lower GI Bleed with Acute Blood Loss Anemia 2. Acute on Chronic Heart Failure 3. Acute on Chronic Kidney Disease Stage 4 4. Advanced Dementia 5. Polyuria 6. Hypertension 7. Type 2 Diabetes  Discharge Medications: Allergies as of 05/27/2018      Reactions   Iodides Anaphylaxis, Other (See Comments)   Iodinated Diagnostic Agents Anaphylaxis   Iohexol Anaphylaxis    Desc: RN states pt stated he had anyphalactic shock reaction to contrast approx 10 years ago.   Other Shortness Of Breath   Ace Inhibitors Other (See Comments)   cough   Simvastatin Other (See Comments)   Muscle aches   Aspirin    Other reaction(s): Abdominal Pain      Medication List    STOP taking these medications   atenolol 50 MG tablet Commonly known as:  TENORMIN     TAKE these medications   acetaminophen 500 MG tablet Commonly known as:  TYLENOL Take 500 mg by mouth every 6 (six) hours as needed.   ALIGN PO Take 1 capsule by mouth daily.   amLODipine 5 MG tablet Commonly known as:  NORVASC Take 1 tablet (5 mg total) by mouth daily. What changed:    medication strength  how much to take   CALCIUM PO Take 600 mg by mouth 2 (two) times daily.   COQ10 PO Take 1 capsule by mouth daily.   Darbepoetin Alfa 40 MCG/0.4ML Sosy injection Commonly known as:  ARANESP Inject 0.4 mLs (40 mcg total) into the skin once a week. Start taking on:  06/02/2018   divalproex 125 MG capsule Commonly known as:  DEPAKOTE SPRINKLE Take 125 mg by mouth every morning.   docusate sodium 100 MG capsule Commonly known as:  COLACE Take 2 capsules (200 mg total) by mouth 2 (two) times daily.   ezetimibe 10 MG tablet Commonly known as:  ZETIA Take 1 tablet (10 mg total) by mouth  daily.   feeding supplement (GLUCERNA SHAKE) Liqd Take 237 mLs by mouth 3 (three) times daily as needed (feeding supplement).   FREESTYLE LITE TEST VI USE AS DIRECTED   furosemide 40 MG tablet Commonly known as:  LASIX Take 1 tablet (40 mg total) by mouth daily as needed. What changed:    medication strength  how much to take   ICAPS PO Take 1 tablet by mouth 2 (two) times daily.   MULTIVITAMIN ADULT PO Take 1 capsule by mouth daily.   insulin glargine 100 UNIT/ML injection Commonly known as:  LANTUS Inject 6-18 Units into the skin as needed. Sliding scale   IRON PO Take 1 tablet by mouth daily.   isosorbide-hydrALAZINE 20-37.5 MG tablet Commonly known as:  BIDIL Take 1 tablet by mouth 3 (three) times daily.   linagliptin 5 MG Tabs tablet Commonly known as:  TRADJENTA Take 7.5 mg by mouth daily.   pantoprazole 40 MG tablet Commonly known as:  PROTONIX Take 1 tablet (40 mg total) by mouth 2 (two) times daily.   polyethylene glycol packet Commonly known as:  MIRALAX / GLYCOLAX Take 17 g by mouth daily as needed for moderate constipation.   QUEtiapine 25 MG tablet Commonly known as:  SEROQUEL Take 0.5 tablets (12.5 mg total) by mouth at bedtime. What changed:    how much to take  when to take this  additional instructions   SENNA PO Take 8.6 mg by mouth daily.   sertraline 25 MG tablet Commonly known as:  ZOLOFT Take 25 mg by mouth daily.       Disposition and follow-up:   [name] was discharged from [facility] in Stable condition.  At the hospital follow up visit please address:  1.  Please evaluate volume status, GI bleeding/anemia, and renal function while on Lasix PRN.  2.  Labs / imaging needed at time of follow-up: BMP and CBC  3.  Pending labs/ test needing follow-up: None  Follow-up Appointments:   Hospital Course by problem list: 1. Lower GI bleed with Acute Blood Loss Anemia: [name] presented  with painless, self-limited lower GI bleed. Hemoglobin fell from his baseline of 10 g (anemia due to CKD) to 6.4 g. He remained hemodynamically stable. He was transfused with 2 units pRBC with post-transfusion Hb of 10.5. Nuclear medicine tagged red blood cell study revealed a potential bleed at the distal duodenum vs proximal colon (results largely indeterminate). EGD was then performed which was unrevealing of any source of bleeding. GI believed that his bleeding was due to hemorrhoids vs stercoral ulcer vs diverticulosis and recommended observation and supportive care. He continued to have intermittent self-resolving melena for the next several days. A pill endoscopy was then performed which showed 2 non-bleeding AVM's. GI recommended against colonoscopy given patient's age and other co-morbidities. [name] hemoglobin remained decreased but stable between 7.5 and 8.0. He was given 1 unit of pRBC prior to discharge with an increase in his Hb to 10.6. He was stable prior to discharge home.  2. Acute on Chronic Heart Failure: [name] was intermittently fluid overload with shortness of breath and crackles on lung exam. He had normal oxygen saturation throughout his admission. CXR on 05/25/18 revealed mild-to-moderate bilateral pulmonary edema. He was treated with IV Lasix with good response. On the day of discharge, his weight was up only 0.2 kg from admission. He did have continued brief and transient episodes of tachypnea but these did not appear to be secondary to volume overload. He was discharge home with his PO Lasix PRN.  3. Acute on Chronic Kidney Disease Stage 4: He had an increase of his creatinine to 3.21 (baseline 2.9-3.0) after use of IV Lasix. It was more beneficial to maximize his cardiac function to decrease volume overload even if it meant sacrificing some renal function. His discharge Creatinine was 3.56. He will need a repeat BMP at his hospital follow-up to evaluate kidney  function.  4. Advanced Dementia: At baseline, [name] is very hard of hearing and oriented only to self. Throughout his admission, he became intermittently agitated during the nights and was given Geodon. His home Seroquel and Depakote was continued. He was discharged at his baseline mental state.   5. Polyuria: He continued to have polyuria per his daughter. During the last admission, his UA showed 50 glucose and trace leukocytes. Urine osmolality was 303 and not concerning for diabetes insipidus. He had no electrolyte abnormalities. His daughter was advised to follow-up with his PCP for further work-up which may include a 24-hour urine with osmolality as an outpatient.   6. Hypertension: His home amlodipine, Bidil, and atenolol were held in the setting of GI bleed. He had elevated blood pressures throughout his admission with systolic's from 621-308'M. His home amlodipine was re-started at a lower dose of 5 mg QD with a modest  decrease in blood pressure. His other BP medications were held at discharge and he will need re-evaluation of his anti-hypertensive needs at his follow-up visit.   7. Type II Diabetes: His home regiment includes Lantus sliding scale at bedtime and linagliptin. He was continued on Lantus 8 u qhs and SI with blood sugars remaining within normal limits. He was discharged on his home regiment.   Discharge Vitals:   BP (!) 178/83   Pulse 61   Temp 98.4 F (36.9 C) (Oral)   Resp 20   Ht 5\' 11"  (1.803 m)   Wt 66.3 kg   SpO2 98%   BMI 20.39 kg/m   Pertinent Labs, Studies, and Procedures:  CBC Latest Ref Rng & Units 05/27/2018 05/26/2018 05/25/2018  WBC 4.0 - 10.5 K/uL 8.0 6.6 6.9  Hemoglobin 13.0 - 17.0 g/dL 10.6(L) 8.0(L) 7.9(L)  Hematocrit 39.0 - 52.0 % 32.6(L) 24.7(L) 24.0(L)  Platelets 150 - 400 K/uL PLATELET CLUMPS NOTED ON SMEAR, UNABLE TO ESTIMATE 190 173   BMP Latest Ref Rng & Units 05/27/2018 05/26/2018 05/25/2018  Glucose 70 - 99 mg/dL 93 282(H) 243(H)   BUN 8 - 23 mg/dL 53(H) 54(H) 50(H)  Creatinine 0.61 - 1.24 mg/dL 3.61(H) 3.58(H) 3.53(H)  BUN/Creat Ratio 10 - 24 - - -  Sodium 135 - 145 mmol/L 145 140 139  Potassium 3.5 - 5.1 mmol/L 3.8 4.3 4.2  Chloride 98 - 111 mmol/L 111 106 107  CO2 22 - 32 mmol/L 23 23 22   Calcium 8.9 - 10.3 mg/dL 9.4 9.4 9.1    05/25/18 CXR: 1. Perihilar interstitial accentuation with hazy opacities at the lung bases favoring mild-to-moderate edema. Atypical pneumonia is a differential diagnostic consideration. 2. Mild to moderate enlargement of the cardiopericardial silhouette. Prior CABG. 3. Dual lead pacer in place.  Discharge Instructions: Discharge Instructions    (HEART FAILURE PATIENTS) Call MD:  Anytime you have any of the following symptoms: 1) 3 pound weight gain in 24 hours or 5 pounds in 1 week 2) shortness of breath, with or without a dry hacking cough 3) swelling in the hands, feet or stomach 4) if you have to sleep on extra pillows at night in order to breathe.   Complete by:  As directed    Call MD for:   Complete by:  As directed    Diet - low sodium heart healthy   Complete by:  As directed    Discharge instructions   Complete by:  As directed    Increase activity slowly   Complete by:  As directed       Signed: [name], MD 05/27/2018, 2:24 PM   Pager: [phone]

## 2018-05-27 NOTE — Progress Notes (Addendum)
Date: 05/27/2018  Patient name: [name]  Medical record number: [id number]  Date of birth: 06-10-1923   I have seen and evaluated this patient and I have discussed the plan of care with the house staff. Please see their note for complete details. I concur with their findings with the following additions/corrections: [name] was seen earlier this morning on rounds with the team.  The team returned around 11 AM to speak with her daughter.  I had received checkout from [name] yesterday who felt if the hemoglobin was stable and his respiratory status was stable that he would be able to be discharged to home.  This morning, the nurse reported that when venipuncture was attempted, it would clot off before they could get a vial of blood.  On her first visit, [name] was not interactive but at 11 AM, he was much more alert and at his baseline.  He is 100% on room air.  His respiratory rate varies from 13 to the upper 20s.  He shows no respiratory distress and his lungs are clear to auscultation bilaterally.  His daughter at bedside is concerned about his respiratory status and states that usually gets worse in the early afternoon.  She also has concerns about his creatinine and potassium but is okay with not doing an ABG to check his hemoglobin after he received 1 unit PRBCs yesterday.  A/P 1.  Lower GI diverticular bleed with acute blood loss anemia -his hemoglobin was 8 yesterday, 7.9 the day prior and the decision the team made with the daughter was to transfuse 1 unit packed red blood cells yesterday and reassess his hemoglobin this morning.  We are unable to do a venipuncture in the team and the daughter felt an ABG was not warranted just to assess the hemoglobin as we are certain his hemoglobin is stable.  2.  Acute on chronic systolic heart failure -today he is clinically euvolemic.  His lungs are clear.  He is oxygenating well.  His creatinine has increased with  diuresis.  His weight is only 0.2 kg up from the 20th.  He does have very brief and transient episodes of tachypnea but these do not appear to be secondary to volume overload.  He would have many other reasons to have tachypnea including agitation or delirium.  With his GFR in the teens and last known EF in the 30 to 35% range and not being a candidate for any advanced cardiac interventions, we will need to sacrifice some renal function in order to maintain euvolemia.  We cannot optimize both renal and cardiac functions.  It will be more beneficial to maximize his cardiac function to decrease volume overload even if it means sacrificing his GFR.  There is no need to repeat a BMP today.  His potassium has always been stable even with 3 days of IV diuresis.  If his creatinine has increased further, it will not change what we do.  He will need PRN Lasix based on his clinical status, weight, and exam findings.  I agree with [name] he was unable to assess him today but did assess him yesterday and felt that he would likely be a candidate to be discharged today.  He is euvolemic and requires no further inpatient IV diuresis or monitoring.  His daughter at the bedside is concerned about his respiratory status that is a documented above, this is not due to heart failure and requires no further work-up or intervention.  We will  reassess [name] in a couple of hours and as long as there is no change, we will proceed with discharge to home.  He will need frequent outpatient follow-up to assess his volume status and administer Lasix on a PRN basis.  Review of data from recent inpatient days: 20th - wt 66.5 kg, Cr 2.76 21st -lactated Ringer's was started and later in the day breathing changes were noted and the IV fluids were stopped 23rd -increased work of breathing noted.  60 mg IV Lasix administered. Wt 67 kg 24th Cr 3.53, POCUS & CXR showed changes c/w mild pul edema. 40 IV Lasix administered 25th Cr 3.58, 40  IV Lasix administered 26th - wt 66.3 kg  Paul Crews, MD 05/27/2018, 11:42 AM   Pt re-eval at 1400. Unchanged. Labs reviewed. Pt remains stable for D/C home. Daughter informed by [name] in presence of charge nurse. I personally spent > 30 minutes with team arranging for Paul [name]' D/C.

## 2018-05-28 ENCOUNTER — Other Ambulatory Visit: Payer: Self-pay | Admitting: Internal Medicine

## 2018-05-29 LAB — GLUCOSE, CAPILLARY: Glucose-Capillary: 265 mg/dL — ABNORMAL HIGH (ref 70–99)

## 2018-06-02 MED FILL — GERHARDT'S BUTT CREAM: 100000 | 30 days supply | Qty: 180 | Fill #0

## 2018-07-14 ENCOUNTER — Other Ambulatory Visit: Payer: Self-pay

## 2018-07-14 ENCOUNTER — Inpatient Hospital Stay (HOSPITAL_COMMUNITY)
Admission: EM | Admit: 2018-07-14 | Discharge: 2018-08-02 | DRG: 871 | Disposition: E | Payer: Medicare Other | Attending: Internal Medicine | Admitting: Internal Medicine

## 2018-07-14 ENCOUNTER — Encounter (HOSPITAL_COMMUNITY): Payer: Self-pay

## 2018-07-14 ENCOUNTER — Emergency Department (HOSPITAL_COMMUNITY): Payer: Medicare Other

## 2018-07-14 DIAGNOSIS — I5022 Chronic systolic (congestive) heart failure: Secondary | ICD-10-CM | POA: Diagnosis not present

## 2018-07-14 DIAGNOSIS — M25552 Pain in left hip: Secondary | ICD-10-CM | POA: Diagnosis not present

## 2018-07-14 DIAGNOSIS — D72829 Elevated white blood cell count, unspecified: Secondary | ICD-10-CM

## 2018-07-14 DIAGNOSIS — R7989 Other specified abnormal findings of blood chemistry: Secondary | ICD-10-CM | POA: Diagnosis not present

## 2018-07-14 DIAGNOSIS — F05 Delirium due to known physiological condition: Secondary | ICD-10-CM | POA: Diagnosis present

## 2018-07-14 DIAGNOSIS — I2510 Atherosclerotic heart disease of native coronary artery without angina pectoris: Secondary | ICD-10-CM | POA: Diagnosis present

## 2018-07-14 DIAGNOSIS — E43 Unspecified severe protein-calorie malnutrition: Secondary | ICD-10-CM

## 2018-07-14 DIAGNOSIS — Z951 Presence of aortocoronary bypass graft: Secondary | ICD-10-CM

## 2018-07-14 DIAGNOSIS — F0390 Unspecified dementia without behavioral disturbance: Secondary | ICD-10-CM | POA: Diagnosis present

## 2018-07-14 DIAGNOSIS — E872 Acidosis: Secondary | ICD-10-CM | POA: Diagnosis not present

## 2018-07-14 DIAGNOSIS — G9341 Metabolic encephalopathy: Secondary | ICD-10-CM | POA: Diagnosis present

## 2018-07-14 DIAGNOSIS — T43595A Adverse effect of other antipsychotics and neuroleptics, initial encounter: Secondary | ICD-10-CM | POA: Diagnosis not present

## 2018-07-14 DIAGNOSIS — R0902 Hypoxemia: Secondary | ICD-10-CM | POA: Diagnosis not present

## 2018-07-14 DIAGNOSIS — Z8546 Personal history of malignant neoplasm of prostate: Secondary | ICD-10-CM

## 2018-07-14 DIAGNOSIS — I5042 Chronic combined systolic (congestive) and diastolic (congestive) heart failure: Secondary | ICD-10-CM | POA: Diagnosis present

## 2018-07-14 DIAGNOSIS — N184 Chronic kidney disease, stage 4 (severe): Secondary | ICD-10-CM | POA: Diagnosis present

## 2018-07-14 DIAGNOSIS — Z79899 Other long term (current) drug therapy: Secondary | ICD-10-CM

## 2018-07-14 DIAGNOSIS — R238 Other skin changes: Secondary | ICD-10-CM | POA: Diagnosis not present

## 2018-07-14 DIAGNOSIS — Z794 Long term (current) use of insulin: Secondary | ICD-10-CM

## 2018-07-14 DIAGNOSIS — R0600 Dyspnea, unspecified: Secondary | ICD-10-CM

## 2018-07-14 DIAGNOSIS — R21 Rash and other nonspecific skin eruption: Secondary | ICD-10-CM | POA: Diagnosis not present

## 2018-07-14 DIAGNOSIS — D638 Anemia in other chronic diseases classified elsewhere: Secondary | ICD-10-CM | POA: Diagnosis present

## 2018-07-14 DIAGNOSIS — N390 Urinary tract infection, site not specified: Secondary | ICD-10-CM | POA: Diagnosis present

## 2018-07-14 DIAGNOSIS — Z950 Presence of cardiac pacemaker: Secondary | ICD-10-CM | POA: Diagnosis not present

## 2018-07-14 DIAGNOSIS — J9811 Atelectasis: Secondary | ICD-10-CM | POA: Diagnosis not present

## 2018-07-14 DIAGNOSIS — R570 Cardiogenic shock: Secondary | ICD-10-CM | POA: Diagnosis not present

## 2018-07-14 DIAGNOSIS — I495 Sick sinus syndrome: Secondary | ICD-10-CM | POA: Diagnosis not present

## 2018-07-14 DIAGNOSIS — Z8249 Family history of ischemic heart disease and other diseases of the circulatory system: Secondary | ICD-10-CM

## 2018-07-14 DIAGNOSIS — R680 Hypothermia, not associated with low environmental temperature: Secondary | ICD-10-CM | POA: Diagnosis present

## 2018-07-14 DIAGNOSIS — G2402 Drug induced acute dystonia: Secondary | ICD-10-CM

## 2018-07-14 DIAGNOSIS — M25559 Pain in unspecified hip: Secondary | ICD-10-CM

## 2018-07-14 DIAGNOSIS — R531 Weakness: Secondary | ICD-10-CM | POA: Diagnosis not present

## 2018-07-14 DIAGNOSIS — N179 Acute kidney failure, unspecified: Secondary | ICD-10-CM | POA: Diagnosis present

## 2018-07-14 DIAGNOSIS — I213 ST elevation (STEMI) myocardial infarction of unspecified site: Secondary | ICD-10-CM

## 2018-07-14 DIAGNOSIS — G210 Malignant neuroleptic syndrome: Secondary | ICD-10-CM | POA: Diagnosis not present

## 2018-07-14 DIAGNOSIS — Z66 Do not resuscitate: Secondary | ICD-10-CM | POA: Diagnosis present

## 2018-07-14 DIAGNOSIS — E1165 Type 2 diabetes mellitus with hyperglycemia: Secondary | ICD-10-CM | POA: Diagnosis present

## 2018-07-14 DIAGNOSIS — A419 Sepsis, unspecified organism: Secondary | ICD-10-CM | POA: Diagnosis present

## 2018-07-14 DIAGNOSIS — F0391 Unspecified dementia with behavioral disturbance: Secondary | ICD-10-CM | POA: Diagnosis not present

## 2018-07-14 DIAGNOSIS — E870 Hyperosmolality and hypernatremia: Secondary | ICD-10-CM | POA: Diagnosis present

## 2018-07-14 DIAGNOSIS — Z823 Family history of stroke: Secondary | ICD-10-CM

## 2018-07-14 DIAGNOSIS — M6282 Rhabdomyolysis: Secondary | ICD-10-CM | POA: Diagnosis present

## 2018-07-14 DIAGNOSIS — Z4659 Encounter for fitting and adjustment of other gastrointestinal appliance and device: Secondary | ICD-10-CM

## 2018-07-14 DIAGNOSIS — E860 Dehydration: Secondary | ICD-10-CM | POA: Diagnosis present

## 2018-07-14 DIAGNOSIS — Z86718 Personal history of other venous thrombosis and embolism: Secondary | ICD-10-CM

## 2018-07-14 DIAGNOSIS — M7989 Other specified soft tissue disorders: Secondary | ICD-10-CM | POA: Diagnosis not present

## 2018-07-14 DIAGNOSIS — G9340 Encephalopathy, unspecified: Secondary | ICD-10-CM | POA: Diagnosis not present

## 2018-07-14 DIAGNOSIS — R509 Fever, unspecified: Secondary | ICD-10-CM

## 2018-07-14 DIAGNOSIS — N183 Chronic kidney disease, stage 3 (moderate): Secondary | ICD-10-CM | POA: Diagnosis not present

## 2018-07-14 DIAGNOSIS — E1122 Type 2 diabetes mellitus with diabetic chronic kidney disease: Secondary | ICD-10-CM | POA: Diagnosis present

## 2018-07-14 DIAGNOSIS — R197 Diarrhea, unspecified: Secondary | ICD-10-CM | POA: Diagnosis not present

## 2018-07-14 DIAGNOSIS — I10 Essential (primary) hypertension: Secondary | ICD-10-CM | POA: Diagnosis present

## 2018-07-14 DIAGNOSIS — R32 Unspecified urinary incontinence: Secondary | ICD-10-CM | POA: Diagnosis not present

## 2018-07-14 DIAGNOSIS — Y92239 Unspecified place in hospital as the place of occurrence of the external cause: Secondary | ICD-10-CM | POA: Diagnosis not present

## 2018-07-14 DIAGNOSIS — I130 Hypertensive heart and chronic kidney disease with heart failure and stage 1 through stage 4 chronic kidney disease, or unspecified chronic kidney disease: Secondary | ICD-10-CM | POA: Diagnosis present

## 2018-07-14 DIAGNOSIS — J690 Pneumonitis due to inhalation of food and vomit: Secondary | ICD-10-CM | POA: Diagnosis not present

## 2018-07-14 DIAGNOSIS — R330 Drug induced retention of urine: Secondary | ICD-10-CM | POA: Diagnosis not present

## 2018-07-14 DIAGNOSIS — Z6820 Body mass index (BMI) 20.0-20.9, adult: Secondary | ICD-10-CM

## 2018-07-14 DIAGNOSIS — E869 Volume depletion, unspecified: Secondary | ICD-10-CM | POA: Diagnosis present

## 2018-07-14 DIAGNOSIS — F329 Major depressive disorder, single episode, unspecified: Secondary | ICD-10-CM | POA: Diagnosis present

## 2018-07-14 DIAGNOSIS — R0681 Apnea, not elsewhere classified: Secondary | ICD-10-CM | POA: Diagnosis not present

## 2018-07-14 DIAGNOSIS — R339 Retention of urine, unspecified: Secondary | ICD-10-CM | POA: Diagnosis not present

## 2018-07-14 DIAGNOSIS — R0682 Tachypnea, not elsewhere classified: Secondary | ICD-10-CM | POA: Diagnosis present

## 2018-07-14 DIAGNOSIS — Z833 Family history of diabetes mellitus: Secondary | ICD-10-CM

## 2018-07-14 DIAGNOSIS — N2889 Other specified disorders of kidney and ureter: Secondary | ICD-10-CM | POA: Diagnosis present

## 2018-07-14 DIAGNOSIS — T443X5A Adverse effect of other parasympatholytics [anticholinergics and antimuscarinics] and spasmolytics, initial encounter: Secondary | ICD-10-CM | POA: Diagnosis not present

## 2018-07-14 DIAGNOSIS — N189 Chronic kidney disease, unspecified: Secondary | ICD-10-CM

## 2018-07-14 DIAGNOSIS — I255 Ischemic cardiomyopathy: Secondary | ICD-10-CM | POA: Diagnosis present

## 2018-07-14 DIAGNOSIS — E119 Type 2 diabetes mellitus without complications: Secondary | ICD-10-CM

## 2018-07-14 LAB — URINALYSIS, ROUTINE W REFLEX MICROSCOPIC
Bilirubin Urine: NEGATIVE
Glucose, UA: 50 mg/dL — AB
KETONES UR: 5 mg/dL — AB
Nitrite: NEGATIVE
PROTEIN: 100 mg/dL — AB
Specific Gravity, Urine: 1.012 (ref 1.005–1.030)
pH: 5 (ref 5.0–8.0)

## 2018-07-14 LAB — COMPREHENSIVE METABOLIC PANEL
ALK PHOS: 68 U/L (ref 38–126)
ALT: 20 U/L (ref 0–44)
AST: 50 U/L — AB (ref 15–41)
Albumin: 3.3 g/dL — ABNORMAL LOW (ref 3.5–5.0)
Anion gap: 14 (ref 5–15)
BUN: 81 mg/dL — AB (ref 8–23)
CALCIUM: 10.1 mg/dL (ref 8.9–10.3)
CHLORIDE: 124 mmol/L — AB (ref 98–111)
CO2: 21 mmol/L — ABNORMAL LOW (ref 22–32)
CREATININE: 4.05 mg/dL — AB (ref 0.61–1.24)
GFR, EST AFRICAN AMERICAN: 14 mL/min — AB (ref >60)
GFR, EST NON AFRICAN AMERICAN: 12 mL/min — AB (ref >60)
Glucose, Bld: 288 mg/dL — ABNORMAL HIGH (ref 70–99)
Potassium: 4.6 mmol/L (ref 3.5–5.1)
Sodium: 159 mmol/L — ABNORMAL HIGH (ref 135–145)
Total Bilirubin: 1 mg/dL (ref 0.3–1.2)
Total Protein: 7.3 g/dL (ref 6.5–8.1)

## 2018-07-14 LAB — CBC WITH DIFFERENTIAL/PLATELET
Abs Immature Granulocytes: 0.05 10*3/uL (ref 0.00–0.07)
BASOS ABS: 0 10*3/uL (ref 0.0–0.1)
Basophils Relative: 0 %
EOS ABS: 0 10*3/uL (ref 0.0–0.5)
EOS PCT: 0 %
HEMATOCRIT: 38.9 % — AB (ref 39.0–52.0)
HEMOGLOBIN: 11.9 g/dL — AB (ref 13.0–17.0)
Immature Granulocytes: 1 %
LYMPHS ABS: 0.5 10*3/uL — AB (ref 0.7–4.0)
LYMPHS PCT: 6 %
MCH: 29.5 pg (ref 26.0–34.0)
MCHC: 30.6 g/dL (ref 30.0–36.0)
MCV: 96.3 fL (ref 80.0–100.0)
MONO ABS: 0.4 10*3/uL (ref 0.1–1.0)
Monocytes Relative: 4 %
NRBC: 0 % (ref 0.0–0.2)
Neutro Abs: 7.6 10*3/uL (ref 1.7–7.7)
Neutrophils Relative %: 89 %
Platelets: 190 10*3/uL (ref 150–400)
RBC: 4.04 MIL/uL — AB (ref 4.22–5.81)
RDW: 14.1 % (ref 11.5–15.5)
WBC: 8.5 10*3/uL (ref 4.0–10.5)

## 2018-07-14 LAB — POC OCCULT BLOOD, ED: Fecal Occult Bld: NEGATIVE

## 2018-07-14 LAB — I-STAT CG4 LACTIC ACID, ED
Lactic Acid, Venous: 3.11 mmol/L (ref 0.5–1.9)
Lactic Acid, Venous: 1.91 mmol/L — ABNORMAL HIGH (ref 0.5–1.9)

## 2018-07-14 LAB — CBG MONITORING, ED: Glucose-Capillary: 254 mg/dL — ABNORMAL HIGH (ref 70–99)

## 2018-07-14 MED ORDER — INSULIN ASPART 100 UNIT/ML ~~LOC~~ SOLN: 0.0000 [IU] | Freq: Every day | SUBCUTANEOUS | Status: DC

## 2018-07-14 MED ORDER — SODIUM CHLORIDE 0.9% FLUSH
3.0000 mL | Freq: Two times a day (BID) | INTRAVENOUS | Status: DC
  Administered 2018-07-15 – 2018-07-16 (×3): 3 mL via INTRAVENOUS
  Administered 2018-07-19: 10 mL via INTRAVENOUS

## 2018-07-14 MED ORDER — ONDANSETRON HCL 4 MG PO TABS: 4.0000 mg | ORAL_TABLET | Freq: Four times a day (QID) | ORAL | Status: DC | PRN

## 2018-07-14 MED ORDER — POLYETHYLENE GLYCOL 3350 17 G PO PACK: 17.0000 g | PACK | Freq: Every day | ORAL | Status: DC | PRN

## 2018-07-14 MED ORDER — ISOSORB DINITRATE-HYDRALAZINE 20-37.5 MG PO TABS
1.0000 | ORAL_TABLET | Freq: Every day | ORAL | Status: DC
  Filled 2018-07-14 (×2): qty 1

## 2018-07-14 MED ORDER — SODIUM CHLORIDE 0.9 % IV SOLN
2.0000 g | Freq: Once | INTRAVENOUS | Status: AC
  Administered 2018-07-14: 2 g via INTRAVENOUS
  Filled 2018-07-14: qty 2

## 2018-07-14 MED ORDER — SODIUM CHLORIDE 0.9 % IV SOLN: 1.0000 g | INTRAVENOUS | Status: DC

## 2018-07-14 MED ORDER — ZIPRASIDONE HCL 20 MG PO CAPS
20.0000 mg | ORAL_CAPSULE | Freq: Every day | ORAL | Status: DC
  Filled 2018-07-14 (×2): qty 1

## 2018-07-14 MED ORDER — DIVALPROEX SODIUM 125 MG PO DR TAB: 125.0000 mg | DELAYED_RELEASE_TABLET | Freq: Once | ORAL | Status: DC

## 2018-07-14 MED ORDER — STERILE WATER FOR INJECTION IJ SOLN
INTRAMUSCULAR | Status: AC
  Filled 2018-07-14: qty 10

## 2018-07-14 MED ORDER — ACETAMINOPHEN 650 MG RE SUPP
650.0000 mg | Freq: Four times a day (QID) | RECTAL | Status: DC | PRN
  Administered 2018-07-17 – 2018-07-18 (×2): 650 mg via RECTAL
  Filled 2018-07-14 (×3): qty 1

## 2018-07-14 MED ORDER — ACETAMINOPHEN 325 MG PO TABS: 650.0000 mg | ORAL_TABLET | Freq: Four times a day (QID) | ORAL | Status: DC | PRN

## 2018-07-14 MED ORDER — ZIPRASIDONE MESYLATE 20 MG IM SOLR
5.0000 mg | Freq: Once | INTRAMUSCULAR | Status: AC
  Administered 2018-07-14: 5 mg via INTRAMUSCULAR
  Filled 2018-07-14: qty 20

## 2018-07-14 MED ORDER — INSULIN ASPART 100 UNIT/ML ~~LOC~~ SOLN: 0.0000 [IU] | Freq: Three times a day (TID) | SUBCUTANEOUS | Status: DC

## 2018-07-14 MED ORDER — QUETIAPINE FUMARATE 50 MG PO TABS
50.0000 mg | ORAL_TABLET | Freq: Every day | ORAL | Status: DC
  Filled 2018-07-14: qty 1

## 2018-07-14 MED ORDER — ONDANSETRON HCL 4 MG/2ML IJ SOLN: 4.0000 mg | Freq: Four times a day (QID) | INTRAMUSCULAR | Status: DC | PRN

## 2018-07-14 MED ORDER — SODIUM CHLORIDE 0.9 % IV SOLN
1.0000 g | INTRAVENOUS | Status: DC
  Administered 2018-07-15 – 2018-07-20 (×6): 1 g via INTRAVENOUS
  Filled 2018-07-14 (×6): qty 10

## 2018-07-14 MED ORDER — DEXTROSE-NACL 2.5-0.45 % IV SOLN: INTRAVENOUS | Status: DC

## 2018-07-14 MED ORDER — DIVALPROEX SODIUM 125 MG PO CSDR
125.0000 mg | DELAYED_RELEASE_CAPSULE | Freq: Every evening | ORAL | Status: DC
  Filled 2018-07-14 (×3): qty 1

## 2018-07-14 MED ORDER — SODIUM CHLORIDE 0.9 % IV BOLUS
500.0000 mL | Freq: Once | INTRAVENOUS | Status: AC
  Administered 2018-07-14: 500 mL via INTRAVENOUS

## 2018-07-14 MED ORDER — GLUCERNA SHAKE PO LIQD: 237.0000 mL | Freq: Three times a day (TID) | ORAL | Status: DC | PRN

## 2018-07-14 NOTE — ED Notes (Signed)
Per MD, Pt okay to have applesauce. Daughters still at bedside.

## 2018-07-14 NOTE — Progress Notes (Signed)
Pharmacy Antibiotic Note  [name] is a 84 y.o. male admitted on 07/23/2018 with UTI.  Pharmacy has been consulted for cefepime dosing.  Plan: Start cefepime 1g IV Q24h Monitor clinical picture, renal function F/U C&S, abx deescalation / LOT     No data recorded.  No results for input(s): WBC, CREATININE, LATICACIDVEN, VANCOTROUGH, VANCOPEAK, VANCORANDOM, GENTTROUGH, GENTPEAK, GENTRANDOM, TOBRATROUGH, TOBRAPEAK, TOBRARND, AMIKACINPEAK, AMIKACINTROU, AMIKACIN in the last 168 hours.  CrCl cannot be calculated (Patient's most recent lab result is older than the maximum 21 days allowed.).    Allergies  Allergen Reactions  . Iodides Anaphylaxis and Other (See Comments)  . Iodinated Diagnostic Agents Anaphylaxis  . Iohexol Anaphylaxis     Desc: RN states pt stated he had anyphalactic shock reaction to contrast approx 10 years ago.   . Other Shortness Of Breath  . Ace Inhibitors Other (See Comments)    cough  . Simvastatin Other (See Comments)    Muscle aches  . Aspirin     Other reaction(s): Abdominal Pain    Thank you for allowing pharmacy to be a part of this patient's care.  Reginia Naas 07/19/2018 6:08 PM

## 2018-07-14 NOTE — ED Notes (Signed)
Unable to assess pt at this time d/t agitation.  Will attempt again.  Daughters at bedside.

## 2018-07-14 NOTE — H&P (Signed)
Date: 07/15/2018               Patient Name:  [name] MRN: [id number]  DOB: 29-Sep-1922 Age / Sex: 84 y.o., male   PCP: Paul Poll, MD         Medical Service: Internal Medicine Teaching Service         Attending Physician: Dr. Joni Carroll    First Contact: Dr. Donne Carroll  Pager: [phone]  Second Contact: Dr. Maricela Carroll  Pager: [phone]       After Hours (After 5p/  First Contact Pager: [phone]  weekends / holidays): Second Contact Pager: [phone]   Chief Complaint: AMS, urinary incontinence   History of Present Illness: [name] is a 84 y/o gentleman with PMHx CAD, CKD stage IV, Dementia, T2DM, HTN, HFrEF, tachybrady syndrome s/p pacemaker presents for AMS, as well as urinary incontinence for the past week. History was obtained from two daughters at bedside. For the last week, patient has been more agitated than usual. He has had significantly decreased PO intake. Normally he can use his walker to get to bathroom and back on his own, but has been unable to do so the last few days. Daughters note he has has been incontinence of urine the last few days. They have also noticed increased number of stools than normal. They are described as pasty and dark. He recently had stool testing done which was negative for infection. He had routine labs taken yesterday which showed hypernatremia at 154 and an AKI.   His daughters are also concerned that he appears to have trouble swallowing the last few days. He will hold food in his mouth for prolonged periods of time and either eventually swallow it or spit it back out. Denies any cough or after eating or history of aspiration events.  Denies fevers, chills, syncope, nasal congestion, cough, dyspnea, abdominal pain, hematochezia, hematuria, or skin changes.    Meds:  Current Meds  Medication Sig  . atenolol (TENORMIN) 50 MG tablet Take 50 mg by mouth daily as needed (SBP >150). Crush and mix with applesauce  . divalproex (DEPAKOTE  SPRINKLE) 125 MG capsule Take 125 mg by mouth every evening.   . feeding supplement, GLUCERNA SHAKE, (GLUCERNA SHAKE) LIQD Take 237 mLs by mouth 3 (three) times daily as needed (feeding supplement).  . ferrous sulfate 325 (65 FE) MG tablet Take 325 mg by mouth daily with breakfast. Crush and mix with applesauce  . furosemide (LASIX) 40 MG tablet Take 1 tablet (40 mg total) by mouth daily as needed.  . insulin glargine (LANTUS) 100 unit/mL SOPN Inject 8-20 Units into the skin daily as needed (CBG >200 (per sliding scale)).  . isosorbide-hydrALAZINE (BIDIL) 20-37.5 MG tablet Take 1 tablet by mouth 3 (three) times daily. (Patient taking differently: Take 1 tablet by mouth daily. Crush and mix with applesauce)  . linagliptin (TRADJENTA) 5 MG TABS tablet Take 7.5 mg by mouth daily.  . polyethylene glycol (MIRALAX / GLYCOLAX) packet Take 8.5-17 g by mouth daily as needed (constipation).   Marland Kitchen senna (SENOKOT) 8.6 MG TABS tablet Take 1 tablet by mouth daily. Crush and mix with applesauce  . ziprasidone (GEODON) 20 MG capsule Take 20 mg by mouth daily. Sprinkle on applesauce     Allergies: Allergies as of 07/20/2018 - Review Complete 07/07/2018  Allergen Reaction Noted  . Iodides Anaphylaxis and Other (See Comments) 10/07/2011  . Iodinated diagnostic agents Anaphylaxis 06/06/2014  . Iohexol Anaphylaxis 09/23/2009  .  Other Shortness Of Breath 11/09/2014  . Ace inhibitors Other (See Comments) 06/06/2014  . Simvastatin Other (See Comments) 06/06/2014  . Aspirin Other (See Comments) 04/30/2015   Past Medical History:  Diagnosis Date  . CAD (coronary artery disease)    a. CABG 1991 and 2006 LIMA LAD, seq. SVG D1-Cfx, SVG d2,and seq. SVG AM-PDA   . Chronic systolic CHF (congestive heart failure) ([place])   . CKD (chronic kidney disease)   . Dementia ([name])   . Depression   . Diabetes mellitus   . Diverticulitis   . GI bleeding 05/11/2018  . HTN (hypertension)   . Left leg DVT ([place])   . Presence of  permanent cardiac pacemaker   . Prostate cancer ([place])   . Tachycardia-bradycardia syndrome ([place])     Family History:  Family History  Problem Relation Age of Onset  . Diabetes Mother   . Stroke Mother   . Heart disease Sister   . Heart disease Sister   . Heart disease Sister   . Heart disease Sister      Social History: Married and lives with his wife, has a Neurosurgeon Denies any tobacco, EtOH, and illicit substance use  Review of Systems: A complete ROS was negative except as per HPI.  Physical Exam: Blood pressure (!) 156/73, pulse 81, temperature 99 F (37.2 C), temperature source Oral, resp. rate (!) 21, weight 66.5 kg, SpO2 96 %. General: awake, chronically ill appearing, frail HEENT: [place]/AT; conjunctiva clear; appears dry Neck: supple; no JVD CV: RRR; 2/6 SEM at LUSB Pulm: normal work of breathing; poor inspiratory effort but overall clear Abd: abdomen is soft, non-distended, non-tender  Ext: no pitting edema; distal pulses intact bilaterally Neuro: awake, minimally responsive; becomes easily agitated when examined  Skin: warm, dry; no evidence of rash or skin breakdown   EKG: personally reviewed my interpretation is junctional rhythm; no ischemic changes   CXR: personally reviewed my interpretation is dual lead pacemaker; mild silhouetting of left cardiac border; no pneumothorax or bony abnormality; appears similar to CXR on 10/24  Assessment & Plan by Problem: Active Problems:   Sepsis [facility] 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 On presentation is hypothermic and tachypneic. Found to have U/A positive for leuks and bacteria. Elevated lactic acid at 3.. BMP revealed worsening hypernatremia to 159, as well AKI on CKD.   1. Sepsis 2/2 UTI - patient presented with hypothermia, altered mental status meeting  sepsis criteria; no leukocytosis  - placed on bair hugger  - U/A positive for leukocytes and bacteria; culture pending  - received cefepime in ED; will narrow to ceftriaxone  - s/p 500 mL bolus of normal saline in ED; will continue D5 at 125 mL/hour  - hemodynamically stable; continue close monitoring  2. Hypernatremia in the setting of sepsis and decreased PO intake.  - Na 159 on admission - unclear if this has been an acute change or more gradual over the last 2 weeks - he has a free water deficit of 4.5 L.  - this may also be contributing to his change in mental status - he is quite volume sensitive in the setting of HFrEF. Will replete slowly with D 5 at 125 mL hour - Goal Na change is no more than 12 mEq over the next 12 hours - due to patient's delirium, family  is requesting minimal sticks for blood work so will monitor with BMP in the morning   3. AKI on CKD - creatinine 4.0, which is up from baseline. Crt on last discharge was 3.5; reported baseline of ~ 3.0. Likely pre-renal due to volume depletion as above.  - volume repletion - trend BMP   4. T2DM - home meds include tradjenta 7.5 mg daily and 8-20 units Lantus based on CBGs - will place on sensitive sliding scale while inpatient   5. Advanced Dementia with acute delirium in setting of above - delirium precautions - patient is unable to take PO at this time due to mental status; in the past Geodon has been more effective than Haldol; will do Geodon 10 mg q 2 hrs prn agitation  - will continue home Seroquel, Depakote, and Geodon once able to take PO  6. HTN - blood pressure is stable; continue home Bidil in the morning - holding home atenolol and amlodipine   7. HFrEF - chronic, stable - holding Lasix in the setting of sepsis and volume depletion - will monitor volume status closely as we replete his free water deficit  - strict I&Os, daily weights   8. Anemia of chronic disease - hgb stable at 11.9; no signs or  symptoms of active bleeding; FOBT negative in ED   DIET: liquids (may need SLP eval) DVT ppx: SCDs CODE: DNR   Dispo: Admit patient to Inpatient with expected length of stay greater than 2 midnights.  SignedDelice Bison, DO 07/15/2018, 2:08 AM  Pager: [phone]

## 2018-07-14 NOTE — ED Provider Notes (Signed)
[facility] EMERGENCY DEPARTMENT Provider Note   CSN: [id number] Arrival date & time: 07/25/2018  1721     History   Chief Complaint Chief Complaint  Patient presents with  . Abnormal Lab    HPI [name] is a 84 y.o. male.  The history is provided by the EMS personnel and medical records. No language interpreter was used.  Abnormal Lab   [name] is a 84 y.o. male who presents to the Emergency Department complaining of abnormal lab. He presents to the emergency department by EMS for evaluation of lab abnormality. Level V caveat due to dementia. History is provided by EMS and daughters. He resides at home with his wife and caregivers. Over the last few days he has had increased stool output with decreased oral intake. He had outpatient labs performed yesterday at home with noted sodium of 154, potassium four, BUN 75, creatinine 3.6, glucose 209, chloride 120, bicarb 23. WBC 8.7, hemoglobin 11. He has renal insufficiency at baseline with a baseline creatinine of 2.6 to 3.2. He vomited one week ago. No difficulty breathing. No reports of fevers. Past Medical History:  Diagnosis Date  . CAD (coronary artery disease)    a. CABG 1991 and 2006 LIMA LAD, seq. SVG D1-Cfx, SVG d2,and seq. SVG AM-PDA   . Chronic systolic CHF (congestive heart failure) ([place])   . CKD (chronic kidney disease)   . Dementia ([place])   . Depression   . Diabetes mellitus   . Diverticulitis   . GI bleeding 05/11/2018  . HTN (hypertension)   . Left leg DVT ([place])   . Presence of permanent cardiac pacemaker   . Prostate cancer ([place])   . Tachycardia-bradycardia syndrome [facility])     Patient Active Problem List   Diagnosis Date Noted  . Sepsis ([place]) 07/03/2018  . Shortness of breath   . AVM (arteriovenous malformation) of small bowel, acquired 05/23/2018  . Acute blood loss anemia   . Acute GI bleeding 05/16/2018  . Coronary artery disease involving native coronary artery of  native heart without angina pectoris   . Lower GI bleed 05/11/2018  . Sinus bradycardia-tachycardia syndrome ([place])   . Diverticulosis of colon with hemorrhage   . Diabetes mellitus type 2, insulin dependent ([place]) 02/27/2018  . Chronic combined systolic (congestive) and diastolic (congestive) heart failure ([place]) 02/27/2018  . Elevated troponin 02/27/2018  . Acute metabolic encephalopathy 09/38/1829  . Acute renal failure superimposed on chronic kidney disease ([place])   . CAD (coronary artery disease) of artery bypass graft 12/16/2017  . Colitis 10/19/2017  . Globus pharyngeus 01/03/2017  . Gastroesophageal reflux disease without esophagitis 01/03/2017  . Palliative care encounter   . Community acquired pneumonia of left lower lobe of lung ([place])   . Left lower lobe pneumonia ([place]) 09/15/2016  . Nausea and vomiting 09/15/2016  . Benign essential HTN 09/15/2016  . Depression 09/15/2016  . Controlled type 2 diabetes mellitus with diabetic nephropathy, without long-term current use of insulin ([place]) 09/15/2016  . Chronic renal insufficiency, stage 4 (severe) ([place]) 09/15/2016  . Influenza with pneumonia 09/15/2016  . Acute on chronic combined systolic and diastolic heart failure ([place]) 09/15/2016  . Bilateral hearing loss 04/27/2016  . ICH (intracerebral hemorrhage) ([place]) 02/09/2016  . Dementia ([place]) 06/06/2014  . Hyperlipidemia 04/18/2012  . Prostate cancer ([place]) 04/18/2012  . Cognitive deficits 10/07/2011  . Cardiac pacemaker in situ     Past Surgical History:  Procedure Laterality Date  . CORONARY ARTERY  BYPASS GRAFT    . ESOPHAGOGASTRODUODENOSCOPY Left 05/17/2018   Procedure: ESOPHAGOGASTRODUODENOSCOPY (EGD);  Surgeon: Ronald Lobo, MD;  Location[facility] ENDOSCOPY;  Service: Endoscopy;  Laterality: Left;  . GIVENS CAPSULE STUDY N/A 05/21/2018   Procedure: GIVENS CAPSULE STUDY;  Surgeon: Ronnette Juniper, MD;  Location: [facility];  Service: Gastroenterology;  Laterality: N/A;  .  PROSTATECTOMY          Home Medications    Prior to Admission medications   Medication Sig Start Date End Date Taking? Authorizing Provider  atenolol (TENORMIN) 50 MG tablet Take 50 mg by mouth daily as needed (SBP >150). Crush and mix with applesauce   Yes [provider]  divalproex (DEPAKOTE SPRINKLE) 125 MG capsule Take 125 mg by mouth every evening.  02/21/18  Yes [provider]  feeding supplement, GLUCERNA SHAKE, (GLUCERNA SHAKE) LIQD Take 237 mLs by mouth 3 (three) times daily as needed (feeding supplement).   Yes [provider]  ferrous sulfate 325 (65 FE) MG tablet Take 325 mg by mouth daily with breakfast. Crush and mix with applesauce   Yes [provider]  furosemide (LASIX) 40 MG tablet Take 1 tablet (40 mg total) by mouth daily as needed. 05/27/18 09/14/18 Yes [name], MD  insulin glargine (LANTUS) 100 unit/mL SOPN Inject 8-20 Units into the skin daily as needed (CBG >200 (per sliding scale)).   Yes [provider]  isosorbide-hydrALAZINE (BIDIL) 20-37.5 MG tablet Take 1 tablet by mouth 3 (three) times daily. Patient taking differently: Take 1 tablet by mouth daily. Crush and mix with applesauce 11/15/15  Yes Rivet, Carly J, MD  linagliptin (TRADJENTA) 5 MG TABS tablet Take 7.5 mg by mouth daily.   Yes [provider]  polyethylene glycol (MIRALAX / GLYCOLAX) packet Take 8.5-17 g by mouth daily as needed (constipation).    Yes [provider]  senna (SENOKOT) 8.6 MG TABS tablet Take 1 tablet by mouth daily. Crush and mix with applesauce   Yes [provider]  ziprasidone (GEODON) 20 MG capsule Take 20 mg by mouth daily. Sprinkle on applesauce   Yes [provider]  amLODipine (NORVASC) 5 MG tablet Take 1 tablet (5 mg total) by mouth daily. Patient not taking: Reported on 07/10/2018 05/27/18 06/26/18  [name], MD  Darbepoetin Alfa (ARANESP) 40 MCG/0.4ML SOSY injection Inject 0.4 mLs  (40 mcg total) into the skin once a week. Patient not taking: Reported on 07/13/2018 06/02/18   [name], MD  docusate sodium (COLACE) 100 MG capsule Take 2 capsules (200 mg total) by mouth 2 (two) times daily. Patient not taking: Reported on 07/24/2018 05/13/18   Dorrell, Andree Elk, MD  ezetimibe (ZETIA) 10 MG tablet Take 1 tablet (10 mg total) by mouth daily. Patient not taking: Reported on 05/11/2018 10/22/17   Florencia Reasons, MD  Glucose Blood (FREESTYLE LITE TEST VI) USE AS DIRECTED 06/02/13   [provider]  pantoprazole (PROTONIX) 40 MG tablet Take 1 tablet (40 mg total) by mouth 2 (two) times daily. Patient not taking: Reported on 07/19/2018 05/27/18 05/27/19  [name], MD  QUEtiapine (SEROQUEL) 25 MG tablet Take 0.5 tablets (12.5 mg total) by mouth at bedtime. Patient not taking: Reported on 08/01/2018 02/28/18   Georgette Shell, MD  QUEtiapine (SEROQUEL) 50 MG tablet Take 50 mg by mouth at bedtime. Crush and sprinkle on applesauce 05/20/18   [provider]    Family History Family History  Problem Relation Age of Onset  .  Diabetes Mother   . Stroke Mother   . Heart disease Sister   . Heart disease Sister   . Heart disease Sister   . Heart disease Sister     Social History Social History   Tobacco Use  . Smoking status: Never Smoker  . Smokeless tobacco: Never Used  Substance Use Topics  . Alcohol use: No    Alcohol/week: 0.0 standard drinks  . Drug use: No     Allergies   Iodides; Iodinated diagnostic agents; Iohexol; Other; Ace inhibitors; Simvastatin; and Aspirin   Review of Systems Review of Systems  Unable to perform ROS: Dementia     Physical Exam Updated Vital Signs BP (!) 165/51   Pulse 84   Temp (!) 95.9 F (35.5 C) (Rectal)   Resp (!) 32   Wt 66.5 kg   SpO2 96%   BMI 20.45 kg/m   Physical Exam Vitals signs and nursing note reviewed.  Constitutional:      Appearance: He is well-developed. He is  ill-appearing.     Comments: Thin.  HENT:     Head: Normocephalic and atraumatic.     Comments: Dry mucous membranes Cardiovascular:     Rate and Rhythm: Normal rate and regular rhythm.     Heart sounds: No murmur.  Pulmonary:     Effort: Pulmonary effort is normal. No respiratory distress.     Breath sounds: Normal breath sounds.  Abdominal:     Palpations: Abdomen is soft.     Tenderness: There is no abdominal tenderness. There is no guarding or rebound.  Musculoskeletal:        General: No tenderness.  Skin:    General: Skin is warm and dry.  Neurological:     Mental Status: He is alert.     Comments: Alert. Very hard of hearing. Minimally verbal. Disoriented to place in time. Profound generalized weakness.  Psychiatric:     Comments: Unable to assess      ED Treatments / Results  Labs (all labs ordered are listed, but only abnormal results are displayed) Labs Reviewed  COMPREHENSIVE METABOLIC PANEL - Abnormal; Notable for the following components:      Result Value   Sodium 159 (*)    Chloride 124 (*)    CO2 21 (*)    Glucose, Bld 288 (*)    BUN 81 (*)    Creatinine, Ser 4.05 (*)    Albumin 3.3 (*)    AST 50 (*)    GFR calc non Af Amer 12 (*)    GFR calc Af Amer 14 (*)    All other components within normal limits  CBC WITH DIFFERENTIAL/PLATELET - Abnormal; Notable for the following components:   RBC 4.04 (*)    Hemoglobin 11.9 (*)    HCT 38.9 (*)    Lymphs Abs 0.5 (*)    All other components within normal limits  URINALYSIS, ROUTINE W REFLEX MICROSCOPIC - Abnormal; Notable for the following components:   Color, Urine AMBER (*)    APPearance TURBID (*)    Glucose, UA 50 (*)    Hgb urine dipstick SMALL (*)    Ketones, ur 5 (*)    Protein, ur 100 (*)    Leukocytes, UA LARGE (*)    WBC, UA >50 (*)    Bacteria, UA FEW (*)    Non Squamous Epithelial 0-5 (*)    All other components within normal limits  I-STAT CG4 LACTIC ACID, ED - Abnormal; Notable for  the  following components:   Lactic Acid, Venous 3.11 (*)    All other components within normal limits  I-STAT CG4 LACTIC ACID, ED - Abnormal; Notable for the following components:   Lactic Acid, Venous 1.91 (*)    All other components within normal limits  CBG MONITORING, ED - Abnormal; Notable for the following components:   Glucose-Capillary 254 (*)    All other components within normal limits  CULTURE, BLOOD (ROUTINE X 2)  CULTURE, BLOOD (ROUTINE X 2)  URINE CULTURE  BASIC METABOLIC PANEL  CBC  POC OCCULT BLOOD, ED    EKG EKG Interpretation  Date/Time:  Friday July 14 2018 17:43:32 EST Ventricular Rate:  61 PR Interval:    QRS Duration: 159 QT Interval:  521 QTC Calculation: 521 R Axis:   -71 Text Interpretation:  Junctional rhythm Left bundle branch block Confirmed by Quintella Reichert [phone]) on 07/19/2018 6:01:05 PM   Radiology Dg Chest Port 1 View  Result Date: 07/06/2018 CLINICAL DATA:  Sepsis EXAM: PORTABLE CHEST 1 VIEW COMPARISON:  05/25/2018 FINDINGS: Post sternotomy changes. Left-sided pacing device. Cardiomegaly. Small left pleural effusion with left basilar airspace disease. Aortic atherosclerosis. No pneumothorax. Apical pleural calcification on the left. IMPRESSION: 1. Cardiomegaly with small left pleural effusion and left basilar atelectasis or pneumonia. Electronically Signed   By: Donavan Foil M.D.   On: 07/22/2018 18:19    Procedures Procedures (including critical care time)  Medications Ordered in ED Medications  ceFEPIme (MAXIPIME) 1 g in sodium chloride 0.9 % 100 mL IVPB (has no administration in time range)  sterile water (preservative free) injection (has no administration in time range)  sodium chloride flush (NS) 0.9 % injection 3 mL (has no administration in time range)  acetaminophen (TYLENOL) tablet 650 mg (has no administration in time range)    Or  acetaminophen (TYLENOL) suppository 650 mg (has no administration in time range)    polyethylene glycol (MIRALAX / GLYCOLAX) packet 17 g (has no administration in time range)  ondansetron (ZOFRAN) tablet 4 mg (has no administration in time range)    Or  ondansetron (ZOFRAN) injection 4 mg (has no administration in time range)  divalproex (DEPAKOTE SPRINKLE) capsule 125 mg (has no administration in time range)  feeding supplement (GLUCERNA SHAKE) (GLUCERNA SHAKE) liquid 237 mL (has no administration in time range)  isosorbide-hydrALAZINE (BIDIL) 20-37.5 MG per tablet 1 tablet (has no administration in time range)  QUEtiapine (SEROQUEL) tablet 50 mg (has no administration in time range)  ziprasidone (GEODON) capsule 20 mg (has no administration in time range)  insulin aspart (novoLOG) injection 0-9 Units (has no administration in time range)  insulin aspart (novoLOG) injection 0-5 Units (has no administration in time range)  dextrose 2.5 % and 0.45 % NaCl infusion (has no administration in time range)  ceFEPIme (MAXIPIME) 2 g in sodium chloride 0.9 % 100 mL IVPB (0 g Intravenous Stopped 07/11/2018 1932)  sodium chloride 0.9 % bolus 500 mL (0 mLs Intravenous Stopped 07/22/2018 2039)  ziprasidone (GEODON) injection 5 mg (5 mg Intramuscular Given 08/01/2018 1932)     Initial Impression / Assessment and Plan / ED Course  I have reviewed the triage vital signs and the nursing notes.  Pertinent labs & imaging results that were available during my care of the patient were reviewed by me and considered in my medical decision making (see chart for details).     Patient with history of dementia, CKD here for evaluation of multiple bowel movements, decreased mental status  as well as abnormal labs. He is ill appearing on evaluation and dehydrated. Labs demonstrate worsening renal function with hypernatremia. He is hypothermic on ED arrival, concern for sepsis. UA is concerning for urinary tract infection. He was treated with gentle IV fluid hydration given his history of heart failure.  Family states that he does not want CPR/intubation.  Internal medicine teaching service consulted for admission for further treatment. Patient's family updated of findings of studies and recommendation for admission and they are in agreement with treatment plan.  Final Clinical Impressions(s) / ED Diagnoses   Final diagnoses:  None    ED Discharge Orders    None       Quintella Reichert, MD 08/01/2018 2243

## 2018-07-14 NOTE — ED Triage Notes (Signed)
Pt BIB [facility] for eval of altered mental status, "frequent bowel movements" x 3 days. Pt had labwork done at home yesterday which showed hypokalemia and hyponatremia. Pt w/ decreased PO intake over last 3 days as well, pt reports increasingly altered mental status. Pt w/ hx of dementia, minimally conversive on arrival.

## 2018-07-15 DIAGNOSIS — I495 Sick sinus syndrome: Secondary | ICD-10-CM

## 2018-07-15 DIAGNOSIS — I130 Hypertensive heart and chronic kidney disease with heart failure and stage 1 through stage 4 chronic kidney disease, or unspecified chronic kidney disease: Secondary | ICD-10-CM

## 2018-07-15 DIAGNOSIS — Z888 Allergy status to other drugs, medicaments and biological substances status: Secondary | ICD-10-CM

## 2018-07-15 DIAGNOSIS — F0391 Unspecified dementia with behavioral disturbance: Secondary | ICD-10-CM

## 2018-07-15 DIAGNOSIS — Z91048 Other nonmedicinal substance allergy status: Secondary | ICD-10-CM

## 2018-07-15 DIAGNOSIS — E1165 Type 2 diabetes mellitus with hyperglycemia: Secondary | ICD-10-CM

## 2018-07-15 DIAGNOSIS — N179 Acute kidney failure, unspecified: Secondary | ICD-10-CM

## 2018-07-15 DIAGNOSIS — E1122 Type 2 diabetes mellitus with diabetic chronic kidney disease: Secondary | ICD-10-CM

## 2018-07-15 DIAGNOSIS — F05 Delirium due to known physiological condition: Secondary | ICD-10-CM

## 2018-07-15 DIAGNOSIS — R32 Unspecified urinary incontinence: Secondary | ICD-10-CM

## 2018-07-15 DIAGNOSIS — I2510 Atherosclerotic heart disease of native coronary artery without angina pectoris: Secondary | ICD-10-CM

## 2018-07-15 DIAGNOSIS — E870 Hyperosmolality and hypernatremia: Secondary | ICD-10-CM

## 2018-07-15 DIAGNOSIS — Z794 Long term (current) use of insulin: Secondary | ICD-10-CM

## 2018-07-15 DIAGNOSIS — Z950 Presence of cardiac pacemaker: Secondary | ICD-10-CM

## 2018-07-15 DIAGNOSIS — Z79899 Other long term (current) drug therapy: Secondary | ICD-10-CM

## 2018-07-15 DIAGNOSIS — Z66 Do not resuscitate: Secondary | ICD-10-CM

## 2018-07-15 DIAGNOSIS — I5022 Chronic systolic (congestive) heart failure: Secondary | ICD-10-CM

## 2018-07-15 DIAGNOSIS — R011 Cardiac murmur, unspecified: Secondary | ICD-10-CM

## 2018-07-15 DIAGNOSIS — N183 Chronic kidney disease, stage 3 (moderate): Secondary | ICD-10-CM

## 2018-07-15 DIAGNOSIS — Z886 Allergy status to analgesic agent status: Secondary | ICD-10-CM

## 2018-07-15 LAB — CBC
HCT: 33.8 % — ABNORMAL LOW (ref 39.0–52.0)
Hemoglobin: 10.4 g/dL — ABNORMAL LOW (ref 13.0–17.0)
MCH: 29.8 pg (ref 26.0–34.0)
MCHC: 30.8 g/dL (ref 30.0–36.0)
MCV: 96.8 fL (ref 80.0–100.0)
Platelets: 157 10*3/uL (ref 150–400)
RBC: 3.49 MIL/uL — AB (ref 4.22–5.81)
RDW: 14.2 % (ref 11.5–15.5)
WBC: 8.4 10*3/uL (ref 4.0–10.5)
nRBC: 0 % (ref 0.0–0.2)

## 2018-07-15 LAB — BASIC METABOLIC PANEL
Anion gap: 15 (ref 5–15)
Anion gap: 10 (ref 5–15)
BUN: 80 mg/dL — ABNORMAL HIGH (ref 8–23)
BUN: 78 mg/dL — ABNORMAL HIGH (ref 8–23)
CALCIUM: 9.3 mg/dL (ref 8.9–10.3)
CO2: 16 mmol/L — ABNORMAL LOW (ref 22–32)
CO2: 17 mmol/L — ABNORMAL LOW (ref 22–32)
Calcium: 9.3 mg/dL (ref 8.9–10.3)
Chloride: 126 mmol/L — ABNORMAL HIGH (ref 98–111)
Chloride: 130 mmol/L — ABNORMAL HIGH (ref 98–111)
Creatinine, Ser: 4.21 mg/dL — ABNORMAL HIGH (ref 0.61–1.24)
Creatinine, Ser: 4.1 mg/dL — ABNORMAL HIGH (ref 0.61–1.24)
GFR calc Af Amer: 13 mL/min — ABNORMAL LOW (ref >60)
GFR calc Af Amer: 14 mL/min — ABNORMAL LOW (ref >60)
GFR calc non Af Amer: 11 mL/min — ABNORMAL LOW (ref >60)
GFR calc non Af Amer: 12 mL/min — ABNORMAL LOW (ref >60)
Glucose, Bld: 398 mg/dL — ABNORMAL HIGH (ref 70–99)
Glucose, Bld: 234 mg/dL — ABNORMAL HIGH (ref 70–99)
Potassium: 4.7 mmol/L (ref 3.5–5.1)
Potassium: 4.2 mmol/L (ref 3.5–5.1)
Sodium: 157 mmol/L — ABNORMAL HIGH (ref 135–145)
Sodium: 157 mmol/L — ABNORMAL HIGH (ref 135–145)

## 2018-07-15 LAB — GLUCOSE, CAPILLARY
GLUCOSE-CAPILLARY: 208 mg/dL — AB (ref 70–99)
Glucose-Capillary: 191 mg/dL — ABNORMAL HIGH (ref 70–99)
Glucose-Capillary: 243 mg/dL — ABNORMAL HIGH (ref 70–99)
Glucose-Capillary: 244 mg/dL — ABNORMAL HIGH (ref 70–99)
Glucose-Capillary: 414 mg/dL — ABNORMAL HIGH (ref 70–99)

## 2018-07-15 LAB — URINE CULTURE: Culture: >=100000 — AB

## 2018-07-15 LAB — OSMOLALITY, URINE: Osmolality, Ur: 367 mOsm/kg (ref 300–900)

## 2018-07-15 MED ORDER — DEXTROSE-NACL 5-0.45 % IV SOLN: INTRAVENOUS | Status: DC

## 2018-07-15 MED ORDER — DEXTROSE 5 % IV SOLN
INTRAVENOUS | Status: AC
  Administered 2018-07-15 – 2018-07-16 (×2): via INTRAVENOUS

## 2018-07-15 MED ORDER — INSULIN ASPART 100 UNIT/ML ~~LOC~~ SOLN
0.0000 [IU] | SUBCUTANEOUS | Status: DC
  Administered 2018-07-15: 9 [IU] via SUBCUTANEOUS
  Administered 2018-07-15 (×2): 2 [IU] via SUBCUTANEOUS
  Administered 2018-07-15: 3 [IU] via SUBCUTANEOUS
  Administered 2018-07-16: 5 [IU] via SUBCUTANEOUS
  Administered 2018-07-16: 7 [IU] via SUBCUTANEOUS
  Administered 2018-07-16: 3 [IU] via SUBCUTANEOUS
  Administered 2018-07-16: 7 [IU] via SUBCUTANEOUS
  Administered 2018-07-16: 5 [IU] via SUBCUTANEOUS
  Administered 2018-07-17: 3 [IU] via SUBCUTANEOUS
  Administered 2018-07-17 – 2018-07-18 (×8): 2 [IU] via SUBCUTANEOUS
  Administered 2018-07-18 – 2018-07-19 (×2): 1 [IU] via SUBCUTANEOUS
  Administered 2018-07-19: 5 [IU] via SUBCUTANEOUS
  Administered 2018-07-19: 3 [IU] via SUBCUTANEOUS
  Administered 2018-07-19: 5 [IU] via SUBCUTANEOUS
  Administered 2018-07-20: 1 [IU] via SUBCUTANEOUS
  Administered 2018-07-20 (×2): 2 [IU] via SUBCUTANEOUS
  Administered 2018-07-20: 3 [IU] via SUBCUTANEOUS
  Administered 2018-07-21: 2 [IU] via SUBCUTANEOUS

## 2018-07-15 MED ORDER — HYDRALAZINE HCL 20 MG/ML IJ SOLN
5.0000 mg | Freq: Three times a day (TID) | INTRAMUSCULAR | Status: DC | PRN
  Administered 2018-07-15 – 2018-07-19 (×5): 5 mg via INTRAVENOUS
  Filled 2018-07-15 (×5): qty 1

## 2018-07-15 MED ORDER — DIPHENHYDRAMINE HCL 50 MG/ML IJ SOLN
12.5000 mg | Freq: Once | INTRAMUSCULAR | Status: AC
  Administered 2018-07-15: 12.5 mg via INTRAVENOUS
  Filled 2018-07-15: qty 1

## 2018-07-15 MED ORDER — AMLODIPINE BESYLATE 5 MG PO TABS: 5.0000 mg | ORAL_TABLET | Freq: Every day | ORAL | Status: DC

## 2018-07-15 MED ORDER — FENTANYL CITRATE (PF) 100 MCG/2ML IJ SOLN
12.5000 ug | Freq: Once | INTRAMUSCULAR | Status: AC | PRN
  Administered 2018-07-15: 12.5 ug via INTRAVENOUS
  Filled 2018-07-15 (×2): qty 2

## 2018-07-15 MED ORDER — INSULIN GLARGINE 100 UNIT/ML ~~LOC~~ SOLN
7.0000 [IU] | Freq: Every day | SUBCUTANEOUS | Status: DC
  Administered 2018-07-15 – 2018-07-20 (×6): 7 [IU] via SUBCUTANEOUS
  Filled 2018-07-15 (×7): qty 0.07

## 2018-07-15 MED ORDER — DEXTROSE 5 % IV SOLN: INTRAVENOUS | Status: DC

## 2018-07-15 MED ORDER — HYDRALAZINE HCL 20 MG/ML IJ SOLN
5.0000 mg | Freq: Once | INTRAMUSCULAR | Status: AC
  Administered 2018-07-15: 5 mg via INTRAVENOUS
  Filled 2018-07-15: qty 1

## 2018-07-15 MED ORDER — DEXTROSE 5 % IV SOLN
INTRAVENOUS | Status: AC
  Administered 2018-07-15 (×2): via INTRAVENOUS

## 2018-07-15 MED ORDER — ZIPRASIDONE MESYLATE 20 MG IM SOLR
10.0000 mg | INTRAMUSCULAR | Status: DC | PRN
  Administered 2018-07-15: 10 mg via INTRAMUSCULAR
  Filled 2018-07-15 (×2): qty 20

## 2018-07-15 MED ORDER — DEXTROSE 5 % IV BOLUS: 500.0000 mL | Freq: Once | INTRAVENOUS | Status: DC

## 2018-07-15 NOTE — Plan of Care (Signed)
  Problem: Clinical Measurements: Goal: Cardiovascular complication will be avoided Outcome: Progressing Note:  No s/s of cardiovascular complication noted.   Problem: Elimination: Goal: Will not experience complications related to urinary retention Note:  No s/s of urinary retention noted.

## 2018-07-15 NOTE — Progress Notes (Signed)
Initial Nutrition Assessment  DOCUMENTATION CODES:   Underweight(will continue to monitor for ability to assess malnutrition)  INTERVENTION:  - Will continue to monitor for nutrition-related needs.  - Continue Glucerna Shake which is ordered TID PRN, each supplement provides 220 kcal and 10 grams of protein.   NUTRITION DIAGNOSIS:   Inadequate oral intake related to dysphagia, lethargy/confusion as evidenced by per patient/family report, meal completion < 25%.  GOAL:   Patient will meet greater than or equal to 90% of their needs  MONITOR:   PO intake, Weight trends, Labs  REASON FOR ASSESSMENT:   Malnutrition Screening Tool  ASSESSMENT:   84 year-old man with PMHx CAD, CKD stage IV, dementia, Type 2 DM, HTN, CHF, tachybrady syndrome s/p pacemaker. He presented to the ED for AMS and urinary incontinence for the past week. Patient found to have AKI and dx with sepsis.   Patient sleeping at time of RD visit. Flow sheet indicates that patient is a/o to self only. Spoke with [name], who is at bedside. [name] reports that patient has had difficulty with swallowing since admission in October. She states that recently patient has needed extensive cueing to swallow. Family will rub patient's neck or the back of his head to prompt him to swallow. She states that patient seems to do better with crunchy items than with soft items. She reports that he has not consumed anything since hospitalization.   Spoke with RN and MD. Plan is for consult to SLP to talk with [name] and/or family to provide tips to aid in swallowing. [name] prefers patient not have a full swallow evaluation/SLP workup at this time.  Per chart review, current weight is 124 lb and weight on 10/26 was 146 lb. This indicates 22 lb weight loss (15% body weight) in the past 2 months. This is significant for time frame. Unable to state malnutrition at this time but highly suspect patient meets criteria for some degree of  malnutrition.  Medications reviewed; sliding scale Novolog, 7 units Lantus/day. Labs reviewed; CBGs: 414 and 243 mg/dL today, Na: 157 mmol/L, Cl: 126 mmol/L, BUN: 80 mg/dL, creatinine: 4.21 mg/dL, GFR: 13 mL/min.  IVF; D5 @ 125 (510 kcal).     NUTRITION - FOCUSED PHYSICAL EXAM:  [name] requested that this not be done as patient becomes agitated with touch.   Diet Order:   Diet Order            Diet regular Room service appropriate? Yes; Fluid consistency: Thin  Diet effective now              EDUCATION NEEDS:   Not appropriate for education at this time  Skin:  Skin Assessment: Reviewed RN Assessment  Last BM:  12/13  Height:   Ht Readings from Last 1 Encounters:  05/21/18 5\' 11"  (1.803 m)    Weight:   Wt Readings from Last 1 Encounters:  07/15/18 56.3 kg    Ideal Body Weight:  78.18 kg  BMI:  Body mass index is 17.31 kg/m.  Estimated Nutritional Needs:   Kcal:  1410-1690 kcal  Protein:  60-70 grams  Fluid:  >/= 1.6 L/day     Paul Matin, MS, RD, LDN, [facility] Inpatient Clinical Dietitian Pager # [phone] After hours/weekend pager # [phone]

## 2018-07-15 NOTE — Progress Notes (Addendum)
   Subjective: Daughter at bedside. 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 Breathing and resting comfortably. Discussed plan with daughter. They would like to take him home tomorrow. We need to get his sodium at a normal level first  Objective:  Vital signs in last 24 hours: Vitals:   08/01/2018 1915 08/01/2018 2115 07/04/2018 2329 07/15/18 0521  BP: (!) 181/72 (!) 165/51 (!) 156/73 (!) 181/74  Pulse: 73 84 81 75  Resp: (!) 25 (!) 32 (!) 21 (!) 38  Temp:   99 F (37.2 C) 97.8 F (36.6 C)  TempSrc:   Oral Axillary  SpO2: 100% 96%  99%  Weight:    56.3 kg   Gen: laying in bed, frail appearing, not alert, moves in response to touch Cardic: RRR, no m/r/g Pulm: CTAB Abd: soft, ND, NT, +BS Ext: superficial abrasion of left shin. 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 On presentation is hypothermic and tachypneic. Found to have U/A positive for leuks and bacteria. Elevated lactic acid at 3. BMP revealed worsening hypernatremia to 159, as well AKI on CKD.   1. Hypernatremia: likely contributing to his AMS, n/v/d. Received D5 1/2 NS overnight. 4.5L free water deficit.  - switch fluids to D5W - recheck Na this afternoon, if not downtrending, will increase fluids.   2. UTI: continue IV ceftriaxone - f/u UCx - f/u BCx  3. AKI on CKD: likely prerenal. CTM with IVFs. Baseline creatine 3.0, was 4.0 on admission.   4. T2DM: hyperglycemic 2/2 D5 fluids - lantus 7 units daily - sensitive SSI novolog   5. Advanced dementia with acute delirium - IM geodon prn agitation - once able to take po, can continue home seroquel, depakote, and transition geodon to po  6. HTN: hypertensive today. Holding home atenolol and amlodipine and imdur  - not able to take po meds - start  IV hydralazine 5mg  TID prn SBP > 150  7. HFrEF: hypertensive but not volume overloaded on exam. Will monitor volume status and oxygen saturation.  - holding home lasix   Diet: advance to regular at family's request  IVFs: D5W 125cc/hr    Dispo: Anticipated discharge in approximately 1-2 day(s).   Isabelle Course, MD 07/15/2018, 12:17 PM Pager: [phone]

## 2018-07-15 NOTE — Progress Notes (Addendum)
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  His daughter is concerned that he is having a dystonic reaction to the Geodon last given at 1055.  She states that he began "cocking his head to the left and will resist anyone trying to move his head".  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 Neuro: He is able to track with appropriate eye movements.  He does not answer questions. HEENT: His head is positioned to the left.  I am able to easily move his neck to central and right position.  He does attempt to resist my moving his head.  He does not seem to be in pain with active movement of his neck.  His lower lip is quivering throughout my exam.  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  During his last admission he did seem to favor his left side which seems very much unchanged during today's presentation. He does have new lip quivering which may be an extrapyramidal symptoms from Geodon.  I am concerned about starting an anticholinergic for treatment of EPS given the patient's history of delirium.  I have discussed this potential side effect with his daughter who would like to proceed with IV Benadryl treatment.  I will order half of the recommended dose at this time. - Geodon discontinued - Ordered IV Benadryl 12.5 mg

## 2018-07-15 NOTE — Progress Notes (Addendum)
Pharmacy Antibiotic Note  [name] is a 84 y.o. male admitted on 07/06/2018 with UTI.  Pharmacy has been consulted for cefepime dosing. Cr now worsening with CrCl <10 ml/min.  Plan: Reduce cefepime to 500mg  IV q24h Follow-up cultures Watch kidney function  ADDENDUM: Cefepime has been narrowed to ceftriaxone per MD. Pharmacy will sign off, reconsult as needed.  Weight: 124 lb 1.6 oz (56.3 kg)  Temp (24hrs), Avg:97.6 F (36.4 C), Min:95.9 F (35.5 C), Max:99 F (37.2 C)  Recent Labs  Lab 07/31/2018 1755 07/06/2018 1821 07/16/2018 2103 07/15/18 0516  WBC 8.5  --   --  8.4  CREATININE 4.05*  --   --  4.21*  LATICACIDVEN  --  3.11* 1.91*  --     Estimated Creatinine Clearance: 8.9 mL/min (A) (by C-G formula based on SCr of 4.21 mg/dL (H)).    Allergies  Allergen Reactions  . Iodides Anaphylaxis and Other (See Comments)  . Iodinated Diagnostic Agents Anaphylaxis  . Iohexol Anaphylaxis     Desc: RN states pt stated he had anyphalactic shock reaction to contrast approx 10 years ago.   . Other Shortness Of Breath  . Ace Inhibitors Other (See Comments)    cough  . Simvastatin Other (See Comments)    Muscle aches  . Aspirin Other (See Comments)     Abdominal Pain    Thank you for allowing pharmacy to be a part of this patient's care.  Arrie Senate, PharmD, BCPS Clinical Pharmacist [phone] Please check AMION for all [place] numbers 07/15/2018

## 2018-07-15 NOTE — Progress Notes (Addendum)
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 His daughter was concerned that the benadryl had worn off and he would "tire out" and "code" from the lip quivering and right arm twitching and requesting a higher dose. She said he responded well to the initial dose but it didn't "resolve the symptoms." He was seen at the bedside turned to his left side, arm holding a pillow twitching and mouth open with his lip quivering. She also expressed concerns over his BP being in the 170's and a temperature of 100.8.   Vitals:   07/15/18 1550 07/15/18 2016  BP: (!) 161/88 (!) 170/68  Pulse: 82 83  Resp: (!) 29 (!) 21  Temp: (!) 100.8 F (38.2 C) 98.2 F (36.8 C)  SpO2:  100%    Physical Exam: Constitutional: seen lying in bed in fetal position on his left side, arm and lip constantly shaking Neuro: he is able to track with appropriate eye movements, not able to answer questions, his lip and arm were shaking, when his arm was met with resistance he pushed back, his twitching movements did not stop with palpation, no improvement of his dystonic symptoms with light touching of affected region (negative sensory trick)  Assessment/Plan: 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 According to the daughter all these symptoms started this morning and improved after 12.5 mg of benadryl was given. I was concerned to give a larger dose of benadryl due to his history of delirium. Daughter would like to proceed with IV benadryl treatment. We agreed on proceeding with another 12.5 mg of benadryl and continuing to monitor and dose with IV benadryl if necessary. For his elevated BP we gave another dose of 5 mg hydralazine; can consider increasing dose if he continues to have elevated BP.  -ordered IV Benadryl 12.5 mg  -f/u mag   ADDENDUM: Additional dose of IV benadryl 12.5 mg given at 1145 pm. Patient was also given half of fentanyl dose, 6.25 mcg at 2230 due to family concerns. He responded well to  both medications and twitching arm movements improved. Paged again at [place] am that he was doing better overall but continuing to have mouth twitching. Ordered another half dose of fentanyl and 12.5 mg of IV benadryl. Difficult to assess level of pain; however, given patient responded well and became more comfortable after pain medication was given we will proceed with the second half of pain med dose and continue benadryl to help his possible dystonia.

## 2018-07-16 ENCOUNTER — Inpatient Hospital Stay (HOSPITAL_COMMUNITY): Payer: Medicare Other

## 2018-07-16 DIAGNOSIS — N390 Urinary tract infection, site not specified: Secondary | ICD-10-CM

## 2018-07-16 DIAGNOSIS — G2402 Drug induced acute dystonia: Secondary | ICD-10-CM

## 2018-07-16 DIAGNOSIS — A419 Sepsis, unspecified organism: Principal | ICD-10-CM

## 2018-07-16 DIAGNOSIS — N184 Chronic kidney disease, stage 4 (severe): Secondary | ICD-10-CM

## 2018-07-16 LAB — CBC WITH DIFFERENTIAL/PLATELET
Abs Immature Granulocytes: 0.06 10*3/uL (ref 0.00–0.07)
Abs Immature Granulocytes: 0.09 10*3/uL — ABNORMAL HIGH (ref 0.00–0.07)
Basophils Absolute: 0 10*3/uL (ref 0.0–0.1)
Basophils Absolute: 0 10*3/uL (ref 0.0–0.1)
Basophils Relative: 0 %
Basophils Relative: 0 %
Eosinophils Absolute: 0 10*3/uL (ref 0.0–0.5)
Eosinophils Absolute: 0 10*3/uL (ref 0.0–0.5)
Eosinophils Relative: 0 %
Eosinophils Relative: 0 %
HCT: 31.9 % — ABNORMAL LOW (ref 39.0–52.0)
HCT: 33.5 % — ABNORMAL LOW (ref 39.0–52.0)
HEMOGLOBIN: 10.5 g/dL — AB (ref 13.0–17.0)
Hemoglobin: 10.1 g/dL — ABNORMAL LOW (ref 13.0–17.0)
Immature Granulocytes: 1 %
Immature Granulocytes: 1 %
LYMPHS PCT: 8 %
Lymphocytes Relative: 9 %
Lymphs Abs: 0.8 10*3/uL (ref 0.7–4.0)
Lymphs Abs: 0.9 10*3/uL (ref 0.7–4.0)
MCH: 29.4 pg (ref 26.0–34.0)
MCH: 29.7 pg (ref 26.0–34.0)
MCHC: 31.3 g/dL (ref 30.0–36.0)
MCHC: 31.7 g/dL (ref 30.0–36.0)
MCV: 93.8 fL (ref 80.0–100.0)
MCV: 93.8 fL (ref 80.0–100.0)
MONOS PCT: 7 %
Monocytes Absolute: 0.7 10*3/uL (ref 0.1–1.0)
Monocytes Absolute: 0.7 10*3/uL (ref 0.1–1.0)
Monocytes Relative: 8 %
NEUTROS PCT: 83 %
Neutro Abs: 7.8 10*3/uL — ABNORMAL HIGH (ref 1.7–7.7)
Neutro Abs: 8 10*3/uL — ABNORMAL HIGH (ref 1.7–7.7)
Neutrophils Relative %: 83 %
Platelets: 138 10*3/uL — ABNORMAL LOW (ref 150–400)
Platelets: DECREASED 10*3/uL (ref 150–400)
RBC: 3.4 MIL/uL — ABNORMAL LOW (ref 4.22–5.81)
RBC: 3.57 MIL/uL — ABNORMAL LOW (ref 4.22–5.81)
RDW: 13.9 % (ref 11.5–15.5)
RDW: 14 % (ref 11.5–15.5)
WBC: 9.4 10*3/uL (ref 4.0–10.5)
WBC: 9.7 10*3/uL (ref 4.0–10.5)
nRBC: 0 % (ref 0.0–0.2)
nRBC: 0.2 % (ref 0.0–0.2)

## 2018-07-16 LAB — BASIC METABOLIC PANEL
Anion gap: 10 (ref 5–15)
Anion gap: 11 (ref 5–15)
Anion gap: 14 (ref 5–15)
BUN: 70 mg/dL — ABNORMAL HIGH (ref 8–23)
BUN: 75 mg/dL — ABNORMAL HIGH (ref 8–23)
BUN: 78 mg/dL — ABNORMAL HIGH (ref 8–23)
CO2: 15 mmol/L — ABNORMAL LOW (ref 22–32)
CO2: 18 mmol/L — ABNORMAL LOW (ref 22–32)
CO2: 20 mmol/L — ABNORMAL LOW (ref 22–32)
CREATININE: 4.04 mg/dL — AB (ref 0.61–1.24)
Calcium: 8.3 mg/dL — ABNORMAL LOW (ref 8.9–10.3)
Calcium: 8.8 mg/dL — ABNORMAL LOW (ref 8.9–10.3)
Calcium: 8.9 mg/dL (ref 8.9–10.3)
Chloride: 115 mmol/L — ABNORMAL HIGH (ref 98–111)
Chloride: 124 mmol/L — ABNORMAL HIGH (ref 98–111)
Chloride: 126 mmol/L — ABNORMAL HIGH (ref 98–111)
Creatinine, Ser: 4.04 mg/dL — ABNORMAL HIGH (ref 0.61–1.24)
Creatinine, Ser: 3.97 mg/dL — ABNORMAL HIGH (ref 0.61–1.24)
GFR calc Af Amer: 14 mL/min — ABNORMAL LOW (ref >60)
GFR calc Af Amer: 14 mL/min — ABNORMAL LOW (ref >60)
GFR calc Af Amer: 14 mL/min — ABNORMAL LOW (ref >60)
GFR calc non Af Amer: 12 mL/min — ABNORMAL LOW (ref >60)
GFR calc non Af Amer: 12 mL/min — ABNORMAL LOW (ref >60)
GFR calc non Af Amer: 12 mL/min — ABNORMAL LOW (ref >60)
GLUCOSE: 284 mg/dL — AB (ref 70–99)
Glucose, Bld: 273 mg/dL — ABNORMAL HIGH (ref 70–99)
Glucose, Bld: 304 mg/dL — ABNORMAL HIGH (ref 70–99)
Potassium: 3.8 mmol/L (ref 3.5–5.1)
Potassium: 3.8 mmol/L (ref 3.5–5.1)
Potassium: 3.9 mmol/L (ref 3.5–5.1)
Sodium: 144 mmol/L (ref 135–145)
Sodium: 154 mmol/L — ABNORMAL HIGH (ref 135–145)
Sodium: 155 mmol/L — ABNORMAL HIGH (ref 135–145)

## 2018-07-16 LAB — CK: Total CK: 7546 U/L — ABNORMAL HIGH (ref 49–397)

## 2018-07-16 LAB — MAGNESIUM
Magnesium: 2.4 mg/dL (ref 1.7–2.4)
Magnesium: 2.5 mg/dL — ABNORMAL HIGH (ref 1.7–2.4)

## 2018-07-16 LAB — GLUCOSE, CAPILLARY
Glucose-Capillary: 288 mg/dL — ABNORMAL HIGH (ref 70–99)
Glucose-Capillary: 306 mg/dL — ABNORMAL HIGH (ref 70–99)

## 2018-07-16 MED ORDER — BENZTROPINE MESYLATE 1 MG/ML IJ SOLN
1.0000 mg | Freq: Every day | INTRAMUSCULAR | Status: DC
  Administered 2018-07-16: 1 mg via INTRAMUSCULAR
  Filled 2018-07-16: qty 1

## 2018-07-16 MED ORDER — DIPHENHYDRAMINE HCL 50 MG/ML IJ SOLN
25.0000 mg | Freq: Once | INTRAMUSCULAR | Status: AC
  Administered 2018-07-16: 25 mg via INTRAVENOUS
  Filled 2018-07-16: qty 1

## 2018-07-16 MED ORDER — BENZTROPINE MESYLATE 1 MG/ML IJ SOLN
2.0000 mg | Freq: Every day | INTRAMUSCULAR | Status: AC
  Administered 2018-07-17 – 2018-07-19 (×2): 2 mg via INTRAMUSCULAR
  Filled 2018-07-16 (×4): qty 2

## 2018-07-16 MED ORDER — DIVALPROEX SODIUM 125 MG PO CSDR
125.0000 mg | DELAYED_RELEASE_CAPSULE | Freq: Every evening | ORAL | Status: DC
  Filled 2018-07-16: qty 1

## 2018-07-16 MED ORDER — FENTANYL CITRATE (PF) 100 MCG/2ML IJ SOLN
12.5000 ug | Freq: Once | INTRAMUSCULAR | Status: AC | PRN
  Administered 2018-07-16: 12.5 ug via INTRAVENOUS
  Filled 2018-07-16: qty 2

## 2018-07-16 MED ORDER — FENTANYL CITRATE (PF) 100 MCG/2ML IJ SOLN
12.5000 ug | INTRAMUSCULAR | Status: DC | PRN
  Administered 2018-07-16 – 2018-07-18 (×7): 12.5 ug via INTRAVENOUS
  Filled 2018-07-16 (×7): qty 2

## 2018-07-16 MED ORDER — DIVALPROEX SODIUM 125 MG PO CSDR
125.0000 mg | DELAYED_RELEASE_CAPSULE | Freq: Every evening | ORAL | Status: DC
  Filled 2018-07-16 (×2): qty 1

## 2018-07-16 MED ORDER — FLUTICASONE PROPIONATE 50 MCG/ACT NA SUSP
2.0000 | Freq: Every day | NASAL | Status: DC
  Administered 2018-07-17: 2 via NASAL
  Filled 2018-07-16: qty 16

## 2018-07-16 MED ORDER — ASPIRIN 300 MG RE SUPP: 300.0000 mg | Freq: Once | RECTAL | Status: DC

## 2018-07-16 MED ORDER — DIPHENHYDRAMINE HCL 50 MG/ML IJ SOLN
12.5000 mg | Freq: Once | INTRAMUSCULAR | Status: AC
  Administered 2018-07-16: 12.5 mg via INTRAVENOUS
  Filled 2018-07-16: qty 1

## 2018-07-16 MED ORDER — BENZTROPINE MESYLATE 1 MG PO TABS
1.0000 mg | ORAL_TABLET | Freq: Every day | ORAL | Status: DC
  Filled 2018-07-16: qty 1

## 2018-07-16 MED ORDER — BENZTROPINE MESYLATE 2 MG PO TABS
2.0000 mg | ORAL_TABLET | Freq: Every day | ORAL | Status: AC
  Filled 2018-07-16 (×2): qty 1

## 2018-07-16 MED ORDER — BENZTROPINE MESYLATE 1 MG/ML IJ SOLN
1.0000 mg | Freq: Once | INTRAMUSCULAR | Status: AC
  Administered 2018-07-16: 1 mg via INTRAMUSCULAR
  Filled 2018-07-16: qty 1

## 2018-07-16 MED ORDER — VALPROATE SODIUM 500 MG/5ML IV SOLN
30.0000 mg | Freq: Four times a day (QID) | INTRAVENOUS | Status: DC | PRN
  Filled 2018-07-16: qty 0.3

## 2018-07-16 MED ORDER — BENZTROPINE MESYLATE 1 MG PO TABS: 1.0000 mg | ORAL_TABLET | Freq: Every day | ORAL | Status: DC

## 2018-07-16 MED ORDER — BENZTROPINE MESYLATE 1 MG/ML IJ SOLN: 2.0000 mg | Freq: Every day | INTRAMUSCULAR | Status: DC

## 2018-07-16 MED ORDER — BENZTROPINE MESYLATE 1 MG PO TABS
1.0000 mg | ORAL_TABLET | Freq: Once | ORAL | Status: DC
  Filled 2018-07-16: qty 1

## 2018-07-16 MED ORDER — QUETIAPINE FUMARATE 50 MG PO TABS: 50.0000 mg | ORAL_TABLET | Freq: Every day | ORAL | Status: DC

## 2018-07-16 MED ORDER — DEXTROSE 5 % IV SOLN
INTRAVENOUS | Status: AC
  Administered 2018-07-16 (×4): via INTRAVENOUS

## 2018-07-16 NOTE — Progress Notes (Signed)
   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 He was not interactive with provider. He pushed the provider away when trying to examine.  Objective:  Vital signs in last 24 hours: Vitals:   07/15/18 2127 07/16/18 0405 07/16/18 0812 07/16/18 0815  BP: (!) 170/68 (!) 150/47 (!) 154/67 (!) 154/67  Pulse:  68    Resp:  (!) 27 (!) 29   Temp:  98.3 F (36.8 C)    TempSrc:  Axillary    SpO2:  100%    Weight:  63.1 kg     Physical Exam  Constitutional: He appears listless. He appears cachectic. He is uncooperative.  On left lateral decubitus position  HENT:  Head: Normocephalic and atraumatic.  Eyes: Conjunctivae are normal.  Cardiovascular: Normal rate, regular rhythm and normal heart sounds.  Respiratory: Breath sounds normal. He is in respiratory distress. He has no wheezes.  GI: Soft. Bowel sounds are normal. He exhibits no distension. There is no abdominal tenderness.  Musculoskeletal:        General: No edema.  Neurological: He appears listless.  Not responsive to commands  Skin: No erythema.   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 Found to have a sodium level of 159, lactic adid of 3, and ua with leukocytes and bacteria on admission.   Acute Encephalopathy At baseline patient is able to walk back and forth to bathroom with rolling walker. He is able to communicate few words. He can also feed himself. UA showed leukocytes and bacteria. Urine culture growing multiple species currently. Blood culture without growth thus far. The Encephalopathy is likely a result of hypernatremia and urosepsis  -Pending urine and blood culture 12/13  Sepsis secondary to urinary infection  Patient is afebrile and without leukocytosis.   -Continue ceftriaxone day 2 -Pending final urine culture  Hypernatremia The  patient's na=155 this morning. Increased D5W rate to 221ml/hr.   -Repeat bmp at 18:00 07/16/18, plan to increase rate further if hypernatremia not trending down  Acute dystonic reaction  Advanced dementia Patient has been having twitching like movements in neck and lip quivering after use of geodon that started yesterday 12/14. The patient received 4 doses of benadryl 12.5mg  last night. He got an additional dose of benadryl 25mg  once prior to be started on cogentin 1mg  today 12/15.   -Patient can be given another cogentin 1mg  today 12/15 for dystonia -Giving fentanyl 12.5mg  prn -Continue seroquel 50mg  qd -Depakote 125mg  or IV valproic acid 30mg  q6hrs prn  Left lower extremity weakness Left lower extremity not moving spontaneously with provocation.   -CT stat ordered  -Aspirin 300mg  pr recommended, but refused by daughter due to the patient's prior GI bleed  Acute on Chronic Renal failure The patient's creatinine at baseline ranges 3.5-3.6 and on admission has been 4.0.   Hypertension  The patient's blood pressure has been ranging 150-160/60-70s.   -IV hydralazine 5mg  tid for sbp>150 as bidil cannot be given  Diabetes Mellitus The patient is on D5W which maybe contributing to hyperglycemia.   -Conitnue lantus 7u qd -Continue ssi q4hrs   Dispo: Anticipated discharge in approximately 2-3 days.  Lars Mage, MD 07/16/2018, 1:40 PM Pager: [phone]

## 2018-07-16 NOTE — Progress Notes (Signed)
Spent most of the shift talking back and forth with the residents regarding daughter's concerns about his Faythe Dingwall reactions from dose given in ER. He had 12.5 mg earlier in the day shift and she feels that he needs much more to keep him from working so hard. Patient's mouth keeps going in chewing/blowing movements and his right arm/shoulder is jerking some but it settles with the Benadryl. Residents came down at the beginning of the shift and spoke with the daughter and POC and concerns regarding too much Benadryl with his Dementia history so will give small doses in one time intervals and continue to monitor. Magnesium level was 2.4 so not likely to be the cause of this muscle spasms and rigidity, will give him small dose of Fentanyl IV and see if that helps to calm him a little and continue to monitor. Daughter also concerned about his treatment plan with right antibiotics and doses and his hydration since patient is NPO for aspiration precautions, will continue to follow patient closely.

## 2018-07-16 NOTE — Progress Notes (Signed)
Patient NT Suctioned for a small amount of thick yellow/reddish sputum. Patient tolerated poorly and very uncomfortable. BBS heard throughout with no Rhonchi anywhere but some coarse BBS in all lung fields. Patient resting at this time and will continue to monitor as per protocol.Sample taken and sent to lab.

## 2018-07-16 NOTE — Progress Notes (Signed)
Went to bedside to update patient's daughter that CT head was negative for acute stroke. She still notes that he has not using his LLE at all. Discussed that if this continues to be a concern, can repeat CT in 48 hours. She states his acute dystonic reaction has improved. She is concerned about some increasing mucous production that she is suctioning from the back of his throat. She was agreeable to trying Flonase.  Also discussed her concerns for his renal function and increased CK. Explained that given his rapid change in sodium, we would decrease the fluid rate for now and re-evaluate based on labs in the morning which she was agreeable to.

## 2018-07-16 NOTE — Progress Notes (Signed)
Patient's family requested doctor be paged, concerned about patient not moving left side as much as he was previously.  Teaching service paged, awaiting call back.

## 2018-07-16 NOTE — Progress Notes (Signed)
Spoke with resident and gave update on patient's condition, VS and labs. Daughter still feels uneasy about turning off his IVF's and not getting anymore Benadryl but resident told me to guarantee her that MD will round on him first and she seemed more content. I also gave the second half of his 12.5 MCGs IV Fentanyl, at daughter's request, and she was okay with that.

## 2018-07-16 NOTE — Progress Notes (Signed)
SLP Cancellation Note  Patient Details Name: [name] MRN: [id number] DOB: 1923-07-14   Cancelled treatment:       Reason Eval/Treat Not Completed: Medical issues which prohibited therapy. Responded to MD order to visit with family per their request. 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 I offered advice incluind use of sensory feedback, hand over hand assist and fluctuating nature of function with people with dementia. They accepted the advice but did not want SLP to return when/if [name] was better able to participate unless they asked MD to reorder.   [name], MA CCC-SLP  Acute Rehabilitation Services Pager [phone] Office [phone]  [name] 07/16/2018, 11:45 AM

## 2018-07-16 NOTE — Progress Notes (Signed)
Internal Medicine Attending:   I saw and examined the patient. I reviewed the resident's note and I agree with the resident's findings and plan as documented in the resident's note.  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  He was evaluated by residents and started on IV Benadryl it appears he was given 3 doses of 12.5 mg of Benadryl.  The daughters report that he did have some improvement after each dose of Benadryl however would return to a spasmodic state.  In between the episodes of spasms they were concerned he was having some pain he did appear to respond to 12.5 mg of fentanyl when it was given.  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  His major muscles in his neck's were tight including the SCM bilaterally his neck was rotated to the right side I cannot passively rotate it back to midline.  His hands were mildly tremulous.  His respiratory rate was elevated lungs were grossly clear.  And he had no pedal edema.  I had a long discussion with both his daughters who are also both physicians.  We mutually agreed upon our plan of action. For his acute dystonic reaction I gave him additional 25 mg of Benadryl.  This did result in some improvement and he was able to relax his I spoke with his daughters after this and they felt that he had not completely improved and we discussed benztropine I gave him additional 1 mg of benztropine IV and reevaluated him.  On my reevaluation of him he was able to return his neck to midline his muscles were much less spasmodic and he was much more comfortable appearing in bed.  I discussed with his daughters as well as Dr. [name] should he have an  incomplete response another milligram of benztropine could be given later in the day.  I will plan to continue benztropine for at least 3 days while we work on his other issues.  For his acute metabolic delirium/ hypernatremia -Again I suspect this is mostly due to hypernatremia and sepsis due to UTI -For his hypernatremia it has not been corrected as much as I would have liked despite our IV fluids.  There may have been some interruptions in IV fluids however grossly I think that we are not matching his ongoing output he may have increased insensible losses due to the dystonic reaction. -I have increase his D5W to 225 cc an hour.  We will repeat a BMP at 6 PM tonight if insufficient improvement will need to increase D5W further.  Sepsis due to UTI -Urine culture polymicrobial, will continue ceftriaxone no sign of any clinical deterioration.  Hypertension -Did have some elevated blood pressures overnight I suspect this is likely partially due to the overnight events likely noted.  I agree with treating with low-dose hydralazine for elevated blood pressures as is ordered.  Type 2 diabetes with hyperglycemia -He does remain hyperglycemic this is due to frequent changes in his fluids D5W remains the best fluid because of his hypernatremia and reduced EF heart failure. -Continue Lantus 7 units daily continue sliding scale insulin every 4 hours-if not well controlled we may need to consider a IV  insulin drip given that we may need to repeatedly changes IV fluid rate.  Advanced dementia with history of aggressive behavior -DC Geodon as noted for likely dystonic reaction. -Seroquel would be okay to give if needed  and can take orally -I spoke with his daughters about his Depakote which is taken for behavioral disturbance he has done well with Depakote in the past.  If he is not able to take Depakote at his tomorrow a.m. time they would potentially like him to have IV valproic acid.  This seems like a  reasonable request however I was unable to place the order in epic.  I have asked pharmacy to assist with this issue.  If he is not able to take the Depakote sprinkle 125 mg at 10 AM I would like him to be able to have IV valproic acid at 30 mg every 6 hours.

## 2018-07-17 ENCOUNTER — Inpatient Hospital Stay (HOSPITAL_COMMUNITY): Payer: Medicare Other

## 2018-07-17 DIAGNOSIS — F0390 Unspecified dementia without behavioral disturbance: Secondary | ICD-10-CM

## 2018-07-17 DIAGNOSIS — G210 Malignant neuroleptic syndrome: Secondary | ICD-10-CM

## 2018-07-17 DIAGNOSIS — I5042 Chronic combined systolic (congestive) and diastolic (congestive) heart failure: Secondary | ICD-10-CM

## 2018-07-17 DIAGNOSIS — M25552 Pain in left hip: Secondary | ICD-10-CM

## 2018-07-17 DIAGNOSIS — T43595A Adverse effect of other antipsychotics and neuroleptics, initial encounter: Secondary | ICD-10-CM

## 2018-07-17 LAB — GLUCOSE, CAPILLARY
GLUCOSE-CAPILLARY: 155 mg/dL — AB (ref 70–99)
GLUCOSE-CAPILLARY: 260 mg/dL — AB (ref 70–99)
Glucose-Capillary: 113 mg/dL — ABNORMAL HIGH (ref 70–99)
Glucose-Capillary: 125 mg/dL — ABNORMAL HIGH (ref 70–99)
Glucose-Capillary: 165 mg/dL — ABNORMAL HIGH (ref 70–99)
Glucose-Capillary: 178 mg/dL — ABNORMAL HIGH (ref 70–99)
Glucose-Capillary: 192 mg/dL — ABNORMAL HIGH (ref 70–99)
Glucose-Capillary: 249 mg/dL — ABNORMAL HIGH (ref 70–99)

## 2018-07-17 LAB — CBC
HCT: 31.2 % — ABNORMAL LOW (ref 39.0–52.0)
Hemoglobin: 10.3 g/dL — ABNORMAL LOW (ref 13.0–17.0)
MCH: 30 pg (ref 26.0–34.0)
MCHC: 33 g/dL (ref 30.0–36.0)
MCV: 91 fL (ref 80.0–100.0)
PLATELETS: 122 10*3/uL — AB (ref 150–400)
RBC: 3.43 MIL/uL — ABNORMAL LOW (ref 4.22–5.81)
RDW: 13.5 % (ref 11.5–15.5)
WBC: 10.1 10*3/uL (ref 4.0–10.5)
nRBC: 0 % (ref 0.0–0.2)

## 2018-07-17 LAB — BASIC METABOLIC PANEL
Anion gap: 16 — ABNORMAL HIGH (ref 5–15)
Anion gap: 13 (ref 5–15)
BUN: 72 mg/dL — ABNORMAL HIGH (ref 8–23)
BUN: 70 mg/dL — AB (ref 8–23)
CALCIUM: 8.5 mg/dL — AB (ref 8.9–10.3)
CO2: 15 mmol/L — ABNORMAL LOW (ref 22–32)
CO2: 15 mmol/L — ABNORMAL LOW (ref 22–32)
CREATININE: 4.11 mg/dL — AB (ref 0.61–1.24)
Calcium: 8.5 mg/dL — ABNORMAL LOW (ref 8.9–10.3)
Chloride: 114 mmol/L — ABNORMAL HIGH (ref 98–111)
Chloride: 114 mmol/L — ABNORMAL HIGH (ref 98–111)
Creatinine, Ser: 4.07 mg/dL — ABNORMAL HIGH (ref 0.61–1.24)
GFR calc Af Amer: 14 mL/min — ABNORMAL LOW (ref >60)
GFR calc Af Amer: 14 mL/min — ABNORMAL LOW (ref >60)
GFR calc non Af Amer: 12 mL/min — ABNORMAL LOW (ref >60)
GFR, EST NON AFRICAN AMERICAN: 12 mL/min — AB (ref >60)
Glucose, Bld: 253 mg/dL — ABNORMAL HIGH (ref 70–99)
Glucose, Bld: 170 mg/dL — ABNORMAL HIGH (ref 70–99)
Potassium: 4.2 mmol/L (ref 3.5–5.1)
Potassium: 3.8 mmol/L (ref 3.5–5.1)
Sodium: 145 mmol/L (ref 135–145)
Sodium: 142 mmol/L (ref 135–145)

## 2018-07-17 LAB — EXPECTORATED SPUTUM ASSESSMENT W GRAM STAIN, RFLX TO RESP C

## 2018-07-17 LAB — CK
Total CK: 7195 U/L — ABNORMAL HIGH (ref 49–397)
Total CK: 6937 U/L — ABNORMAL HIGH (ref 49–397)

## 2018-07-17 MED ORDER — DEXTROSE-NACL 5-0.45 % IV SOLN: INTRAVENOUS | Status: AC

## 2018-07-17 MED ORDER — VALPROATE SODIUM 500 MG/5ML IV SOLN
40.0000 mg | Freq: Three times a day (TID) | INTRAVENOUS | Status: AC
  Administered 2018-07-17: 40 mg via INTRAVENOUS
  Filled 2018-07-17 (×3): qty 0.4

## 2018-07-17 MED ORDER — METRONIDAZOLE IN NACL 5-0.79 MG/ML-% IV SOLN
500.0000 mg | Freq: Three times a day (TID) | INTRAVENOUS | Status: DC
  Administered 2018-07-17 – 2018-07-18 (×4): 500 mg via INTRAVENOUS
  Filled 2018-07-17 (×5): qty 100

## 2018-07-17 MED ORDER — VALPROATE SODIUM 500 MG/5ML IV SOLN
40.0000 mg | Freq: Three times a day (TID) | INTRAVENOUS | Status: DC | PRN
  Filled 2018-07-17: qty 0.4

## 2018-07-17 MED ORDER — DIPHENHYDRAMINE HCL 50 MG/ML IJ SOLN
25.0000 mg | Freq: Once | INTRAMUSCULAR | Status: AC
  Administered 2018-07-17: 25 mg via INTRAVENOUS
  Filled 2018-07-17: qty 1

## 2018-07-17 MED ORDER — DEXTROSE-NACL 5-0.45 % IV SOLN
INTRAVENOUS | Status: AC
  Administered 2018-07-17: 17:00:00 via INTRAVENOUS

## 2018-07-17 MED ORDER — DEXTROSE 5 % IV SOLN
INTRAVENOUS | Status: DC
  Administered 2018-07-17: 08:00:00 via INTRAVENOUS

## 2018-07-17 MED ORDER — DEXTROSE-NACL 5-0.45 % IV SOLN
INTRAVENOUS | Status: AC
  Administered 2018-07-17: 09:00:00 via INTRAVENOUS

## 2018-07-17 NOTE — Progress Notes (Signed)
  Date: 07/17/2018  Patient name: [name]  Medical record number: [id number]  Date of birth: Sep 11, 1922   I have seen and evaluated this patient and I have discussed the plan of care with the house staff. Please see Dr. Cristal Generous note for complete details. I concur with her findings with the following additions/corrections:   There was also concern for aspiration and CXR showed LLL airspace consolidation.  This is possibly related to acute rigidity and inability to clear secretions during illness.  He is on Rocephin.  We will add flagyl for anaerobic coverage.  BC for any fever given new pneumonia.  Trend CK.  He has CHF so fluids will be given liberally, but cautiously to avoid volume overload.  We had a good discussion with family today, daughters are very involved and active in care of their father.    [name], MD 07/17/2018, 3:21 PM

## 2018-07-17 NOTE — Progress Notes (Signed)
Cross cover progress note  Cross covering team was asked to check on the patient for signs of overload.  When I presented to the room his daughter [name] introduced herself.  The patient appeared to be resting comfortably. He did not arouse or open eyes to voice and was intermittently tachypneic. He has a regular rate and rhythm on cardiac exam and lungs sound clear without rhonchi. There is no interval peripheral edema.  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 She was in agreement with the plan and did not express any questions or concerns.

## 2018-07-17 NOTE — Progress Notes (Signed)
Night Team progress note 9 PM reassessment: Patient was resting comfortably on bed. 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  On exam: He is in no respiratory distress and saturating well.  Pulmonary exam was difficult due to shallow breathing at rest but has some course breath sound on left with very mild crackle at left base.  HR and heart sounds normal.  Has mild non pitting edema on left hand. Radial pulse is strong. No evidence of cyanosis or pallor. No lower extremity edema.  CK trended down slightly from 7195 to 6937. Patient's daughter is aware of CXR and CK result and mentions that CK did not improve significantly. Explained to her that will continue current rate of fluid and hesitate to increase it due to Hx of CHF. She agrees with plan.  As discussed with her, mild hand swelling will Nl pulses and no pitting edema. Talking to his daughter, planned to order left upper extremity U/S for DVT.  Also Asked nurse to adjust the hand band that seems tight.  -Continue IV fluid on prior rate -Left upper extremity ultrasound

## 2018-07-17 NOTE — Progress Notes (Signed)
Attempted ABG with no success.  Daughter refused a second stick.  No complications noted.

## 2018-07-17 NOTE — Care Management Important Message (Signed)
Important Message  Patient Details  Name: [name] MRN: [id number] Date of Birth: July 27, 1923   Medicare Important Message Given:  Yes    Erenest Rasher, RN 07/17/2018, 11:47 AM

## 2018-07-17 NOTE — Progress Notes (Addendum)
Spoke to dtr at bedside. Pt has 24 hour caregivers paid out of pocket by family. Pt has transport chair, RW and bedside commode. Pt scheduled to see pt. Daughter is requesting wheelchair with removal arms. Will send message to attending for orders. Jonnie Finner RN CCM Case Mgmt phone [phone]

## 2018-07-17 NOTE — Progress Notes (Signed)
   Subjective: Concern for worsening dystonia/twitches overnight, given another dose of benadryl. Daughter and caregiver are at bedside this morning.   Objective:  Vital signs in last 24 hours: Vitals:   07/17/18 0556 07/17/18 0724 07/17/18 0743 07/17/18 1133  BP: (!) 171/57 (!) 158/64  (!) 155/95  Pulse:  77  71  Resp:  (!) 37  (!) 25  Temp:   100.3 F (37.9 C)   TempSrc:   Rectal   SpO2:      Weight:       Gen: laying in bed on right side, opens eyes to voice and touch Cardic: RRR, no m/r/g Pulm: CTAB Ext: moving all extremities spontaneously MSK: tender to deep palpation of left hip greater trochanter, full ROM of left hip, no skin breakdown on left hip or glute.    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 On presentation is hypothermic and tachypneic. Found to have U/A positive for leuks and bacteria. Elevated lactic acid at 3. BMP revealed worsening hypernatremia to 159, as well AKI on CKD.   Hypernatremia: Improving - Na 142  - discontinue D5W - Start D5 1/2 NS 125cc/hr with careful assessment of volume status  UTI: continue IV ceftriaxone (12/13 - present) - UCx growing multiple species - BCx NG at 3 days  AKI on CKD: likely worsened secondary to elevated CK. Baseline creatine 3.0 - creatine 4.07 today  - IVFs as above  Neuroleptic malignant syndrome: in the setting of seroquel and geodon  use, muscular rigidity, fever, elevated CK, AMS.  - discontinue seroquel and geodon - IVFs as above, trend CK as above - trend CK, recheck this evening.   Left hip pain: endorsed by family today after noticing grimacing with deep palpation of left hip/glute. Concerned that his "30 hour" period of being in acute dystonia could result in a hip dislocation.  - portable left hip x-ray   Chronic Issues Stable:   T2DM: CBGs 113-192, elevated in part due to dextrose containing fluids - lantus 7 units daily - sensitive SSI novolog   Advanced dementia with acute delirium: home meds include geodon, seroquel, depakote - holding geodon and seroquel due to suspected NMS - okay to give depakote - if agitation becomes problematic, can give low dose lorazepam.   HTN: hypertensive today. Holding home atenolol and amlodipine and imdur  - not able to take po meds - start IV hydralazine 5mg  TID prn SBP > 150  HFrEF: hypertensive but not volume overloaded on exam. Will monitor volume status while giving IVFs - holding home lasix   Diet: advance to regular at family's request  IVFs: D5 1/2 NS 125cc/hr x 5hrs, then reassess volume status    Dispo: Anticipated discharge in approximately 1-2 day(s).   Isabelle Course, MD 07/17/2018, 2:19 PM Pager: [phone]

## 2018-07-18 ENCOUNTER — Inpatient Hospital Stay (HOSPITAL_COMMUNITY): Payer: Medicare Other

## 2018-07-18 DIAGNOSIS — M7989 Other specified soft tissue disorders: Secondary | ICD-10-CM

## 2018-07-18 DIAGNOSIS — G9341 Metabolic encephalopathy: Secondary | ICD-10-CM

## 2018-07-18 DIAGNOSIS — R21 Rash and other nonspecific skin eruption: Secondary | ICD-10-CM

## 2018-07-18 DIAGNOSIS — J690 Pneumonitis due to inhalation of food and vomit: Secondary | ICD-10-CM

## 2018-07-18 DIAGNOSIS — G9340 Encephalopathy, unspecified: Secondary | ICD-10-CM

## 2018-07-18 DIAGNOSIS — R0681 Apnea, not elsewhere classified: Secondary | ICD-10-CM

## 2018-07-18 LAB — CBC
HEMATOCRIT: 30.4 % — AB (ref 39.0–52.0)
Hemoglobin: 9.6 g/dL — ABNORMAL LOW (ref 13.0–17.0)
MCH: 28.7 pg (ref 26.0–34.0)
MCHC: 31.6 g/dL (ref 30.0–36.0)
MCV: 90.7 fL (ref 80.0–100.0)
Platelets: 129 10*3/uL — ABNORMAL LOW (ref 150–400)
RBC: 3.35 MIL/uL — ABNORMAL LOW (ref 4.22–5.81)
RDW: 13.5 % (ref 11.5–15.5)
WBC: 8.8 10*3/uL (ref 4.0–10.5)
nRBC: 0.2 % (ref 0.0–0.2)

## 2018-07-18 LAB — C DIFFICILE QUICK SCREEN W PCR REFLEX
C Diff antigen: NEGATIVE
C Diff interpretation: NOT DETECTED
C Diff toxin: NEGATIVE

## 2018-07-18 LAB — CK: Total CK: 2616 U/L — ABNORMAL HIGH (ref 49–397)

## 2018-07-18 LAB — GLUCOSE, CAPILLARY
GLUCOSE-CAPILLARY: 104 mg/dL — AB (ref 70–99)
Glucose-Capillary: 63 mg/dL — ABNORMAL LOW (ref 70–99)
Glucose-Capillary: 136 mg/dL — ABNORMAL HIGH (ref 70–99)
Glucose-Capillary: 161 mg/dL — ABNORMAL HIGH (ref 70–99)
Glucose-Capillary: 177 mg/dL — ABNORMAL HIGH (ref 70–99)
Glucose-Capillary: 176 mg/dL — ABNORMAL HIGH (ref 70–99)

## 2018-07-18 LAB — BASIC METABOLIC PANEL
Anion gap: 11 (ref 5–15)
BUN: 69 mg/dL — ABNORMAL HIGH (ref 8–23)
CO2: 15 mmol/L — ABNORMAL LOW (ref 22–32)
Calcium: 8.2 mg/dL — ABNORMAL LOW (ref 8.9–10.3)
Chloride: 116 mmol/L — ABNORMAL HIGH (ref 98–111)
Creatinine, Ser: 4.38 mg/dL — ABNORMAL HIGH (ref 0.61–1.24)
GFR calc Af Amer: 13 mL/min — ABNORMAL LOW (ref >60)
GFR calc non Af Amer: 11 mL/min — ABNORMAL LOW (ref >60)
Glucose, Bld: 165 mg/dL — ABNORMAL HIGH (ref 70–99)
Potassium: 3.5 mmol/L (ref 3.5–5.1)
SODIUM: 142 mmol/L (ref 135–145)

## 2018-07-18 MED ORDER — SODIUM CHLORIDE 0.9 % IV SOLN
INTRAVENOUS | Status: DC | PRN
  Administered 2018-07-18: 250 mL via INTRAVENOUS

## 2018-07-18 MED ORDER — NALOXONE HCL 0.4 MG/ML IJ SOLN
INTRAMUSCULAR | Status: AC
  Administered 2018-07-18: 13:00:00
  Filled 2018-07-18: qty 1

## 2018-07-18 MED ORDER — DEXTROSE-NACL 5-0.45 % IV SOLN
INTRAVENOUS | Status: AC
  Administered 2018-07-18: 08:00:00 via INTRAVENOUS

## 2018-07-18 MED ORDER — VALPROATE SODIUM 500 MG/5ML IV SOLN
40.0000 mg | Freq: Three times a day (TID) | INTRAVENOUS | Status: DC
  Filled 2018-07-18 (×3): qty 0.4

## 2018-07-18 MED ORDER — NALOXONE HCL 0.4 MG/ML IJ SOLN: 0.4000 mg | INTRAMUSCULAR | Status: DC | PRN

## 2018-07-18 MED ORDER — DEXTROSE-NACL 5-0.45 % IV SOLN
INTRAVENOUS | Status: DC
  Administered 2018-07-18: 21:00:00 via INTRAVENOUS

## 2018-07-18 NOTE — Progress Notes (Addendum)
Night Team progress note 5 AM reassessment: Patient was resting comfortably on bed. Daughter is in the room. She reports tight arm swelling.  On exam: Has fever. No respiratory distress and saturating well.  No change on pulmonary exam.  Mild non pitting edema on left hand is the same. Radial pulse is strong. No evidence of cyanosis or pallor. Right upper extremity has new non pitting edema after new IV insertion, also has blister and skin breakdown after taking off the tape. No lower extremity edema.  Likely IV infiltrate and leaning on right side   -Remove right arm IV line and repositioning the patient -Right (and left) upper extremity ultrasound -Tylenol 650 mg Suppository

## 2018-07-18 NOTE — Progress Notes (Signed)
Temp now 98.4 orally and all other VSS. Pt looks comfortable at this time but daughter is a little upset with her father's arm being so edemadous. MD placed order for an ultrasound of the extremity to R/O DVT, no other changes noted, will continue to monitor.

## 2018-07-18 NOTE — Consult Note (Addendum)
Neurology Consultation  Reason for Consult: Possible neuroleptic malignant syndrome Referring Physician: Gilles Chiquito  CC: Altered mental status with elevated CK  History is obtained from: Chart  HPI: [name] is a 84 y.o. male with history of tachybradycardia syndrome with permanent pacemaker placement.  Left leg DVT, hypertension, GI bleeding, diabetes, dementia, CKD, CAD.  Patient was admitted to the hospital with a low-grade fever and elevated CK.  He was also found to have an elevated sodium of 159, glucose 288, BUN 81, creatinine 4.05 along with urine analysis showing large amount of leukocytes.  At home patient was on Seroquel and also Geodon.  Due to the elevated CK and mild elevated temperature neurology was called to evaluate for possible neuroleptic malignant syndrome.    On arrival to the bedside patient had no fever, showed normal tone and no neck stiffness.  Patient responded to pain with all 4 extremities and was able to withdraw with all 4 extremities and also grimaced to pain.  Family has 2 doctors in the family and are concerned that he is not moving his left leg is much as his right leg and also is apparently looking to the right more than the left.  Thus they also want him evaluated for possible stroke.  Initial CT that was obtained in the ED did not show any stroke.   ROS:  Unable to obtain due to altered mental status.   Past Medical History:  Diagnosis Date  . CAD (coronary artery disease)    a. CABG 1991 and 2006 [place] LAD, seq. SVG D1-Cfx, SVG d2,and seq. SVG AM-PDA   . Chronic systolic CHF (congestive heart failure) ([place])   . CKD (chronic kidney disease)   . Dementia ([place])   . Depression   . Diabetes mellitus   . Diverticulitis   . GI bleeding 05/11/2018  . HTN (hypertension)   . Left leg DVT ([place])   . Presence of permanent cardiac pacemaker   . Prostate cancer ([place])   . Tachycardia-bradycardia syndrome ([place])     Family History  Problem Relation  Age of Onset  . Diabetes Mother   . Stroke Mother   . Heart disease Sister   . Heart disease Sister   . Heart disease Sister   . Heart disease Sister      Social History:   reports that he has never smoked. He has never used smokeless tobacco. He reports that he does not drink alcohol or use drugs.  Medications  Current Facility-Administered Medications:  .  0.9 %  sodium chloride infusion, , Intravenous, PRN, Gilles Chiquito B, MD, Last Rate: 10 mL/hr at 07/18/18 1225, 250 mL at 07/18/18 1225 .  acetaminophen (TYLENOL) tablet 650 mg, 650 mg, Oral, Q6H PRN **OR** acetaminophen (TYLENOL) suppository 650 mg, 650 mg, Rectal, Q6H PRN, Lorella Nimrod, MD, 650 mg at 07/18/18 0521 .  benztropine (COGENTIN) tablet 2 mg, 2 mg, Oral, Daily **OR** benztropine mesylate (COGENTIN) injection 2 mg, 2 mg, Intramuscular, Daily, Hoffman, Erik C, DO, 2 mg at 07/17/18 0958 .  cefTRIAXone (ROCEPHIN) 1 g in sodium chloride 0.9 % 100 mL IVPB, 1 g, Intravenous, Q24H, Lorella Nimrod, MD, Stopped at 07/18/18 0336 .  dextrose 5 %-0.45 % sodium chloride infusion, , Intravenous, Continuous, Chundi, Vahini, MD, Last Rate: 200 mL/hr at 07/18/18 0809 .  feeding supplement (GLUCERNA SHAKE) (GLUCERNA SHAKE) liquid 237 mL, 237 mL, Oral, TID PRN, Lorella Nimrod, MD .  fentaNYL (SUBLIMAZE) injection 12.5 mcg, 12.5 mcg, Intravenous, Q2H PRN,  Lucious Groves, DO, 12.5 mcg at 07/18/18 1202 .  fluticasone (FLONASE) 50 MCG/ACT nasal spray 2 spray, 2 spray, Each Nare, Daily, [place], Carley D, DO, 2 spray at 07/17/18 1000 .  hydrALAZINE (APRESOLINE) injection 5 mg, 5 mg, Intravenous, TID PRN, Isabelle Course, MD, 5 mg at 07/18/18 1226 .  insulin aspart (novoLOG) injection 0-9 Units, 0-9 Units, Subcutaneous, Q4H, Chundi, Vahini, MD, 2 Units at 07/18/18 1239 .  insulin glargine (LANTUS) injection 7 Units, 7 Units, Subcutaneous, Daily, Chundi, Vahini, MD, 7 Units at 07/18/18 1038 .  isosorbide-hydrALAZINE (BIDIL) 20-37.5 MG per tablet 1  tablet, 1 tablet, Oral, Daily, Lorella Nimrod, MD .  metroNIDAZOLE (FLAGYL) IVPB 500 mg, 500 mg, Intravenous, Q8H, Chundi, Vahini, MD, Last Rate: 100 mL/hr at 07/18/18 1226, 500 mg at 07/18/18 1226 .  naloxone (NARCAN) 0.4 MG/ML injection, , , ,  .  naloxone (NARCAN) injection 0.4 mg, 0.4 mg, Intravenous, PRN, Chundi, Vahini, MD .  ondansetron (ZOFRAN) tablet 4 mg, 4 mg, Oral, Q6H PRN **OR** ondansetron (ZOFRAN) injection 4 mg, 4 mg, Intravenous, Q6H PRN, Amin, Sumayya, MD .  polyethylene glycol (MIRALAX / GLYCOLAX) packet 17 g, 17 g, Oral, Daily PRN, Lorella Nimrod, MD .  QUEtiapine (SEROQUEL) tablet 50 mg, 50 mg, Oral, q1800, Hoffman, Erik C, DO .  sodium chloride flush (NS) 0.9 % injection 3 mL, 3 mL, Intravenous, Q12H, Lorella Nimrod, MD, 3 mL at 07/16/18 0946 .  valproate (DEPACON) 40 mg in dextrose 5 % 50 mL IVPB, 40 mg, Intravenous, Q8H PRN, [name], MD   Exam: Current vital signs: BP (!) 163/63 (BP Location: Left Arm)   Pulse 75   Temp 97.7 F (36.5 C) (Axillary)   Resp (!) 22   Wt 59.9 kg   SpO2 90%   BMI 18.41 kg/m  Vital signs in last 24 hours: Temp:  [97.7 F (36.5 C)-98.8 F (37.1 C)] 97.7 F (36.5 C) (12/17 1225) Pulse Rate:  [70-79] 75 (12/17 1300) Resp:  [20-38] 22 (12/17 1300) BP: (125-163)/(41-64) 163/63 (12/17 1225) SpO2:  [90 %-100 %] 90 % (12/17 1225)  Physical Exam  Constitutional: Appears well-developed and well-nourished.  Psych: Affect appropriate to situation Eyes: No scleral injection HENT: No OP obstrucion Head: Normocephalic.  Cardiovascular: Normal rate and regular rhythm.  Respiratory: Effort normal, non-labored breathing GI: Soft.  No distension. There is no tenderness.  Skin: WDI  Neuro: Mental Status: Patient is awake, nonverbal and does not follow verbal commands.  He will look toward my voice. Unable to give history [place] Cranial Nerves: II: Stiff threat bilaterally . III,IV, VI: EOMI without ptosis or diploplia.  Pupils are  equal, round, and reactive to light.   V: Facial sensation is symmetric to temperature VII: Facial movement is symmetric.  VIII: hearing is intact to voice  Motor: Tone is normal. Bulk is normal. 5/5 strength was present in all four extremities.  Sensory: Winces to pain and withdraws to pain all 4 extremities Deep Tendon Reflexes: 2+ and symmetric in the biceps and patellae.  Plantars: Toes are downgoing bilaterally.  Cerebellar: Unable to obtain  Labs I have reviewed labs in epic and the results pertinent to this consultation are:   CBC    Component Value Date/Time   WBC 8.8 07/18/2018 0605   RBC 3.35 (L) 07/18/2018 0605   HGB 9.6 (L) 07/18/2018 0605   HCT 30.4 (L) 07/18/2018 0605   PLT 129 (L) 07/18/2018 0605   MCV 90.7 07/18/2018 0605   MCV 92.7  03/22/2017 1146   MCH 28.7 07/18/2018 0605   MCHC 31.6 07/18/2018 0605   RDW 13.5 07/18/2018 0605   LYMPHSABS 0.9 07/16/2018 1813   MONOABS 0.7 07/16/2018 1813   EOSABS 0.0 07/16/2018 1813   BASOSABS 0.0 07/16/2018 1813    CMP     Component Value Date/Time   NA 142 07/18/2018 0605   NA 143 03/22/2017 1138   K 3.5 07/18/2018 0605   CL 116 (H) 07/18/2018 0605   CO2 15 (L) 07/18/2018 0605   GLUCOSE 165 (H) 07/18/2018 0605   BUN 69 (H) 07/18/2018 0605   BUN 54 (H) 03/22/2017 1138   CREATININE 4.38 (H) 07/18/2018 0605   CALCIUM 8.2 (L) 07/18/2018 0605   PROT 7.3 07/20/2018 1755   PROT 6.7 03/22/2017 1138   ALBUMIN 3.3 (L) 07/13/2018 1755   ALBUMIN 3.8 03/22/2017 1138   AST 50 (H) 07/18/2018 1755   ALT 20 07/23/2018 1755   ALKPHOS 68 07/24/2018 1755   BILITOT 1.0 07/15/2018 1755   BILITOT <0.2 03/22/2017 1138   GFRNONAA 11 (L) 07/18/2018 0605   GFRAA 13 (L) 07/18/2018 0605    Lipid Panel     Component Value Date/Time   CHOL 163 02/27/2018 0440   TRIG 114 02/27/2018 0440   HDL 36 (L) 02/27/2018 0440   CHOLHDL 4.5 02/27/2018 0440   VLDL 23 02/27/2018 0440   LDLCALC 104 (H) 02/27/2018 0440     Imaging I  have reviewed the images obtained:  CT-scan of the brain-no intracranial abnormalities  MRI examination of the brain-unable to obtain secondary to pacemaker  Etta Quill PA-C [facility] [phone]  M-F  (9:00 am- 5:00 PM)  07/18/2018, 2:01 PM     Assessment:  30 60-year-old male presented to the hospital with what most likely is urosepsis.  Patient received Geodon for agitation which resulted in his dystonic reaction which improved with Benadryl.  Also on Seroquel at home for agitation.  Patient noted to have fever, muscle rigidity and elevated CK in the 7000 range and antipsychotics were held due to concern for NMS.  Neurology consulted for evaluation for possible NMS.  Currently on examination patient does not have any muscle rigidity or fever.  CK trending down with most recent level in the 2000 range.  It is possible patient may have had NMS, however patient does not need any further treatment other than supportive care.    On my assessment patient, initially did have right gaze preference however after stimulation would look both sides.  He is moving his left side with moderate strength after noxious stimuli.   Impression: Acute metabolic encephalopathy Dementia with delirium Urosepsis Possible Neuroleptic malignant syndrome    Recommendations: -Repeat CT of head: No acute findings although motion limited examination.  Patient has chronic atrophy and ventriculomegaly consistent with history of dementia. -EEG - continue IV fluids, supportive care - no need for Dantrolene    NEUROHOSPITALIST ADDENDUM Performed a face to face diagnostic evaluation.   I have reviewed the contents of history and physical exam as documented by PA/ARNP/Resident and agree with above documentation.  I have discussed and formulated the above plan as documented. Edits to the note have been made as needed.    Karena Addison Alma Mohiuddin MD [facility] [phone]   If 7pm to 7am,  please call on call as listed on AMION.

## 2018-07-18 NOTE — Procedures (Signed)
History: 84 year old male being evaluated for encephalopathy.  Sedation: None  Technique: This is a 21 channel routine scalp EEG performed at the bedside with bipolar and monopolar montages arranged in accordance to the international 10/20 system of electrode placement. One channel was dedicated to EKG recording.    Background: The background consists predominantly of generalized irregular delta and theta activities.  There is a posterior dominant rhythm(PDR) that never achieves a frequency higher than 6 Hz.  With drowsiness there is anterior shifting of the PDR, and spindles are very briefly seen with symmetric distribution.  Photic stimulation: Physiologic driving is not performed  EEG Abnormalities: 1) generalized slow activity 2) slow PDR  Clinical Interpretation: This EEG is consistent with a generalized non-specific cerebral dysfunction(encephalopathy). There was no seizure or seizure predisposition recorded on this study. Please note that lack of epileptiform activity on EEG does not preclude the possibility of epilepsy.   Roland Rack, MD [facility] [phone]  If 7pm- 7am, please page neurology on call as listed in [place].

## 2018-07-18 NOTE — Progress Notes (Signed)
  Date: 07/18/2018  Patient name: [name]  Medical record number: [id number]  Date of birth: 1922-12-24   I have seen and evaluated this patient and I have discussed the plan of care with the house staff. Please see Dr. Cristal Generous note for complete details. I concur with her findings with the following additions/corrections:   Most likely explanation is metabolic encephalopathy given acute infections and antipsychotic medications with worsening renal function.  Geodon is a very long acting medication and will take a while to wash out of his system.  We will continue to respond to new symptoms and work up his encephalopathy to ensure no other cause can be elucidated.  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  Will continue to treat with supportive care regardless and monitor CK.  Nephrology has been consulted as well at family's request.    [name], MD 07/18/2018, 3:21 PM

## 2018-07-18 NOTE — Evaluation (Addendum)
Physical Therapy Evaluation Patient Details Name: [name] MRN: [id number] DOB: 01/25/1923 Today's Date: 07/18/2018   History of Present Illness  Pt adm with AMS and hypernatremia. Pt found to have UTI. Pt developed possible neuroleptic malignant syndrome.  PMHx: dementia, HF, diverticulosis, CAD, CABG, pacemaker, DM, CKD, prostate CA  Clinical Impression  Pt admitted with above diagnosis and presents to PT with functional limitations due to deficits listed below (See PT problem list). Pt needs skilled PT to maximize independence and safety to allow discharge to home with family and 24 hour caregivers. Recommend w/c with removable armrest to make bed to w/c transfers easier for pt/caregiver. Family very supportive and involved in care.      Follow Up Recommendations Home health PT;Supervision/Assistance - 24 hour    Equipment Recommendations  Wheelchair (measurements PT)(with removable armrest and legrest)    Recommendations for Other Services       Precautions / Restrictions Precautions Precautions: Fall Restrictions Weight Bearing Restrictions: No      Mobility  Bed Mobility Overal bed mobility: Needs Assistance Bed Mobility: Supine to Sit;Sit to Supine     Supine to sit: +2 for physical assistance;Max assist Sit to supine: +2 for physical assistance;Total assist   General bed mobility comments: Assist for all aspects. Pt did assist elevating his trunk into sitting  Transfers                 General transfer comment: Did not attempt  Ambulation/Gait                Stairs            Wheelchair Mobility    Modified Rankin (Stroke Patients Only)       Balance Overall balance assessment: Needs assistance Sitting-balance support: Bilateral upper extremity supported;Feet supported Sitting balance-Leahy Scale: Poor Sitting balance - Comments: mod to min assist sitting EOB x 10 minutes.  Postural control: Other (comment)(anterior)                                    Pertinent Vitals/Pain Pain Assessment: Faces Faces Pain Scale: Hurts little more Pain Location: Briefly lt hip with initial return to extension but no further indication with subsequent ROM Pain Descriptors / Indicators: Grimacing Pain Intervention(s): Limited activity within patient's tolerance    Home Living Family/patient expects to be discharged to:: Private residence Living Arrangements: Spouse/significant other Available Help at Discharge: Family;Personal care attendant;Available 24 hours/day Type of Home: House Home Access: Stairs to enter Entrance Stairs-Rails: Right Entrance Stairs-Number of Steps: 2 Home Layout: One level Home Equipment: Walker - 2 wheels;Cane - single point;Bedside commode;Wheelchair - manual      Prior Function Level of Independence: Needs assistance   Gait / Transfers Assistance Needed: Amb to bathroom and sometimes back with assistance. Has had decr mobility since hospitalizaiton in Oct 2019.   ADL's / Homemaking Assistance Needed: sponge bathes with assist for dressing and bathing, family does the homemaking  Comments: Pt is a retired Careers adviser.     Journalist, newspaper   Dominant Hand: Right    Extremity/Trunk Assessment   Upper Extremity Assessment Upper Extremity Assessment: Difficult to assess due to impaired cognition(Less movement/resistance in LUE vs RUE)    Lower Extremity Assessment Lower Extremity Assessment: Difficult to assess due to impaired cognition(Little or no resistance to any movement of LLE. )    Cervical / Trunk Assessment Cervical / Trunk Assessment:  Kyphotic  Communication   Communication: HOH  Cognition Arousal/Alertness: Awake/alert Behavior During Therapy: Flat affect Overall Cognitive Status: Impaired/Different from baseline                                 General Comments: Pt non verbal and currently not following any commands      General  Comments      Exercises General Exercises - Upper Extremity Shoulder Flexion: PROM;10 reps;Both;Supine Shoulder ABduction: PROM;Both;5 reps;Supine Elbow Flexion: PROM;Both;10 reps;Supine Elbow Extension: PROM;Both;10 reps;Supine Wrist Flexion: PROM;5 reps;Both;Supine Wrist Extension: PROM;Both;5 reps;Supine Digit Composite Flexion: PROM;Both;5 reps;Supine Composite Extension: PROM;Both;5 reps;Supine General Exercises - Lower Extremity Heel Slides: PROM;Both;10 reps;Supine Hip ABduction/ADduction: PROM;Both;10 reps;Supine   Assessment/Plan    PT Assessment Patient needs continued PT services  PT Problem List Decreased strength;Decreased activity tolerance;Decreased balance;Decreased mobility       PT Treatment Interventions DME instruction;Gait training;Functional mobility training;Therapeutic activities;Therapeutic exercise;Balance training;Patient/family education    PT Goals (Current goals can be found in the Care Plan section)  Acute Rehab PT Goals Patient Stated Goal: pt unable to state PT Goal Formulation: With family Time For Goal Achievement: 08/01/18 Potential to Achieve Goals: Fair    Frequency Min 3X/week   Barriers to discharge        Co-evaluation               AM-PAC PT "6 Clicks" Mobility  Outcome Measure Help needed turning from your back to your side while in a flat bed without using bedrails?: Total Help needed moving from lying on your back to sitting on the side of a flat bed without using bedrails?: Total Help needed moving to and from a bed to a chair (including a wheelchair)?: Total Help needed standing up from a chair using your arms (e.g., wheelchair or bedside chair)?: Total Help needed to walk in hospital room?: Total Help needed climbing 3-5 steps with a railing? : Total 6 Click Score: 6    End of Session   Activity Tolerance: Patient tolerated treatment well Patient left: in bed;with call bell/phone within reach;with  nursing/sitter in room;with family/visitor present Nurse Communication: Mobility status PT Visit Diagnosis: Other abnormalities of gait and mobility (R26.89);Muscle weakness (generalized) (M62.81)    Time: 4580-9983 PT Time Calculation (min) (ACUTE ONLY): 45 min   Charges:   PT Evaluation $PT Eval Moderate Complexity: 1 Mod PT Treatments $Therapeutic Activity: 23-37 mins        [place] Pager [phone] Office [place] 07/18/2018, 3:12 PM

## 2018-07-18 NOTE — Progress Notes (Signed)
PT Note  Patient suffers from urosepsis which impairs their ability to perform daily activities like transferring in/out of the bed to access other areas of the home.  A walker alone will not resolve the issues with performing activities of daily living. A lightweight wheelchair will allow patient to safely perform daily activities with caregiver.  The patient can self propel in the home or has a caregiver who can provide assistance.     [place] Pager [phone] Office [phone]

## 2018-07-18 NOTE — Consult Note (Signed)
[facility] Renal Consultation Note  Requesting MD: Daryll Drown Indication for Consultation: advanced CKD- now worsening in the setting of urosepsis and elevated CK  HPI:  [name] is a 84 y.o. male with dementia requiring around-the-clock care at home.  He also has type 2 diabetes mellitus, hypertension, coronary artery disease and stage IV/V CKD of late.  He presented to the hospital on 12/13 with a change in mental status and urinary incontinence.  He was hypothermic and tachycardic.  He had a urinary tract infection and an elevated lactic acid at 3.  Sodium was 159 and he had some acute on chronic renal failure with a creatinine of 4.21.  Also a CK of 7500.   sodium has now normalized-CK has improved to 2600.  He has been treated for UTI and many other issues managed.  No urine output has been recorded, thus appears to be 8 L positive-however, weight is may be down?  He is currently having leads placed for an EEG.  He is not responsive to my voice.  Wife and 2 daughters who are physicians are at the bedside  Creatinine, Ser  Date/Time Value Ref Range Status  07/18/2018 06:05 AM 4.38 (H) 0.61 - 1.24 mg/dL Final  07/17/2018 06:48 AM 4.07 (H) 0.61 - 1.24 mg/dL Final  07/17/2018 01:57 AM 4.11 (H) 0.61 - 1.24 mg/dL Final  07/16/2018 06:13 PM 3.97 (H) 0.61 - 1.24 mg/dL Final  07/16/2018 03:30 AM 4.04 (H) 0.61 - 1.24 mg/dL Final  07/15/2018 11:50 PM 4.04 (H) 0.61 - 1.24 mg/dL Final  07/15/2018 05:48 PM 4.10 (H) 0.61 - 1.24 mg/dL Final  07/15/2018 05:16 AM 4.21 (H) 0.61 - 1.24 mg/dL Final  07/09/2018 05:55 PM 4.05 (H) 0.61 - 1.24 mg/dL Final  05/27/2018 12:01 PM 3.61 (H) 0.61 - 1.24 mg/dL Final  05/26/2018 05:57 AM 3.58 (H) 0.61 - 1.24 mg/dL Final  05/25/2018 05:33 AM 3.53 (H) 0.61 - 1.24 mg/dL Final  05/21/2018 06:55 AM 2.76 (H) 0.61 - 1.24 mg/dL Final  05/18/2018 06:56 AM 3.00 (H) 0.61 - 1.24 mg/dL Final  05/17/2018 02:32 AM 3.12 (H) 0.61 - 1.24 mg/dL Final  05/16/2018  05:09 AM 3.70 (H) 0.61 - 1.24 mg/dL Final  05/16/2018 04:42 AM 3.36 (H) 0.61 - 1.24 mg/dL Final  05/13/2018 06:55 AM 3.21 (H) 0.61 - 1.24 mg/dL Final  05/12/2018 12:57 PM 2.97 (H) 0.61 - 1.24 mg/dL Final  05/12/2018 05:04 AM 2.89 (H) 0.61 - 1.24 mg/dL Final  05/11/2018 09:26 AM 3.10 (H) 0.61 - 1.24 mg/dL Final  02/28/2018 05:44 AM 3.36 (H) 0.61 - 1.24 mg/dL Final  02/27/2018 04:40 AM 3.31 (H) 0.61 - 1.24 mg/dL Final  02/26/2018 11:30 PM 3.57 (H) 0.61 - 1.24 mg/dL Final  10/22/2017 04:24 AM 2.80 (H) 0.61 - 1.24 mg/dL Final  10/21/2017 05:16 AM 2.82 (H) 0.61 - 1.24 mg/dL Final  10/20/2017 04:58 AM 2.99 (H) 0.61 - 1.24 mg/dL Final  10/19/2017 02:12 AM 3.02 (H) 0.61 - 1.24 mg/dL Final  03/22/2017 11:38 AM 2.96 (H) 0.76 - 1.27 mg/dL Final  09/18/2016 05:16 AM 2.92 (H) 0.61 - 1.24 mg/dL Final  09/17/2016 09:41 AM 2.91 (H) 0.61 - 1.24 mg/dL Final  09/16/2016 07:02 AM 2.68 (H) 0.61 - 1.24 mg/dL Final  09/15/2016 01:20 PM 2.89 (H) 0.61 - 1.24 mg/dL Final  02/10/2016 08:15 AM 2.87 (H) 0.61 - 1.24 mg/dL Final  02/09/2016 05:26 PM 2.78 (H) 0.61 - 1.24 mg/dL Final  02/09/2016 05:23 PM 2.70 (H) 0.61 - 1.24 mg/dL Final  11/14/2015 04:53 AM 3.20 (H) 0.61 - 1.24 mg/dL Final  11/13/2015 09:17 AM 3.08 (H) 0.61 - 1.24 mg/dL Final  11/12/2015 11:03 AM 3.13 (H) 0.61 - 1.24 mg/dL Final  07/07/2015 05:02 AM 2.35 (H) 0.61 - 1.24 mg/dL Final  07/06/2015 05:13 AM 2.36 (H) 0.61 - 1.24 mg/dL Final  07/05/2015 11:42 AM 2.79 (H) 0.61 - 1.24 mg/dL Final  04/20/2012 06:30 AM 2.26 (H) 0.50 - 1.35 mg/dL Final  04/19/2012 05:55 AM 2.14 (H) 0.50 - 1.35 mg/dL Final  04/18/2012 07:33 PM 2.06 (H) 0.50 - 1.35 mg/dL Final  01/17/2012 07:37 PM 2.04 (H) 0.50 - 1.35 mg/dL Final  09/28/2009 06:55 AM 2.14 (H) 0.4 - 1.5 mg/dL Final  09/27/2009 06:15 AM 2.23 (H) 0.4 - 1.5 mg/dL Final  09/26/2009 06:00 AM 2.04 (H) 0.4 - 1.5 mg/dL Final  09/25/2009 06:05 AM 1.83 (H) 0.4 - 1.5 mg/dL Final  09/23/2009 05:30 AM 2.13 (H) 0.4 - 1.5  mg/dL Final  09/23/2009 12:45 AM 2.08 (H) 0.4 - 1.5 mg/dL Final     PMHx:   Past Medical History:  Diagnosis Date  . CAD (coronary artery disease)    a. CABG 1991 and 2006 LIMA LAD, seq. SVG D1-Cfx, SVG d2,and seq. SVG AM-PDA   . Chronic systolic CHF (congestive heart failure) ([place])   . CKD (chronic kidney disease)   . Dementia ([place])   . Depression   . Diabetes mellitus   . Diverticulitis   . GI bleeding 05/11/2018  . HTN (hypertension)   . Left leg DVT ([place])   . Presence of permanent cardiac pacemaker   . Prostate cancer ([place])   . Tachycardia-bradycardia syndrome [facility])     Past Surgical History:  Procedure Laterality Date  . CORONARY ARTERY BYPASS GRAFT    . ESOPHAGOGASTRODUODENOSCOPY Left 05/17/2018   Procedure: ESOPHAGOGASTRODUODENOSCOPY (EGD);  Surgeon: Ronald Lobo, MD;  Location: [facility] ENDOSCOPY;  Service: Endoscopy;  Laterality: Left;  . GIVENS CAPSULE STUDY N/A 05/21/2018   Procedure: GIVENS CAPSULE STUDY;  Surgeon: Ronnette Juniper, MD;  Location: [place];  Service: Gastroenterology;  Laterality: N/A;  . PROSTATECTOMY      Family Hx:  Family History  Problem Relation Age of Onset  . Diabetes Mother   . Stroke Mother   . Heart disease Sister   . Heart disease Sister   . Heart disease Sister   . Heart disease Sister     Social History:  reports that he has never smoked. He has never used smokeless tobacco. He reports that he does not drink alcohol or use drugs.  Allergies:  Allergies  Allergen Reactions  . Iodides Anaphylaxis and Other (See Comments)  . Iodinated Diagnostic Agents Anaphylaxis  . Iohexol Anaphylaxis     Desc: RN states pt stated he had anyphalactic shock reaction to contrast approx 10 years ago.   . Other Shortness Of Breath  . Ace Inhibitors Other (See Comments)    cough  . Simvastatin Other (See Comments)    Muscle aches  . Aspirin Other (See Comments)     Abdominal Pain    Medications: Prior to Admission medications    Medication Sig Start Date End Date Taking? Authorizing Provider  atenolol (TENORMIN) 50 MG tablet Take 50 mg by mouth daily as needed (SBP >150). Crush and mix with applesauce   Yes [provider]  divalproex (DEPAKOTE SPRINKLE) 125 MG capsule Take 125 mg by mouth every evening.  02/21/18  Yes [provider]  feeding supplement, [place], ([place])  LIQD Take 237 mLs by mouth 3 (three) times daily as needed (feeding supplement).   Yes [provider]  ferrous sulfate 325 (65 FE) MG tablet Take 325 mg by mouth daily with breakfast. Crush and mix with applesauce   Yes [provider]  furosemide (LASIX) 40 MG tablet Take 1 tablet (40 mg total) by mouth daily as needed. 05/27/18 09/14/18 Yes [name], MD  insulin glargine (LANTUS) 100 unit/mL SOPN Inject 8-20 Units into the skin daily as needed (CBG >200 (per sliding scale)).   Yes [provider]  isosorbide-hydrALAZINE (BIDIL) 20-37.5 MG tablet Take 1 tablet by mouth 3 (three) times daily. Patient taking differently: Take 1 tablet by mouth daily. Crush and mix with applesauce 11/15/15  Yes Rivet, Carly J, MD  linagliptin (TRADJENTA) 5 MG TABS tablet Take 7.5 mg by mouth daily.   Yes [provider]  polyethylene glycol (MIRALAX / GLYCOLAX) packet Take 8.5-17 g by mouth daily as needed (constipation).    Yes [provider]  senna (SENOKOT) 8.6 MG TABS tablet Take 1 tablet by mouth daily. Crush and mix with applesauce   Yes [provider]  ziprasidone (GEODON) 20 MG capsule Take 20 mg by mouth daily. Sprinkle on applesauce   Yes [provider]  amLODipine (NORVASC) 5 MG tablet Take 1 tablet (5 mg total) by mouth daily. Patient not taking: Reported on 07/22/2018 05/27/18 06/26/18  [name], MD  Darbepoetin Alfa (ARANESP) 40 MCG/0.4ML SOSY injection Inject 0.4 mLs (40 mcg total) into the skin once a week. Patient not taking: Reported on  07/20/2018 06/02/18   [name], MD  docusate sodium (COLACE) 100 MG capsule Take 2 capsules (200 mg total) by mouth 2 (two) times daily. Patient not taking: Reported on 07/17/2018 05/13/18   Dorrell, Andree Elk, MD  ezetimibe (ZETIA) 10 MG tablet Take 1 tablet (10 mg total) by mouth daily. Patient not taking: Reported on 05/11/2018 10/22/17   Florencia Reasons, MD  Glucose Blood (FREESTYLE LITE TEST VI) USE AS DIRECTED 06/02/13   [provider]  pantoprazole (PROTONIX) 40 MG tablet Take 1 tablet (40 mg total) by mouth 2 (two) times daily. Patient not taking: Reported on 07/27/2018 05/27/18 05/27/19  [name], MD  QUEtiapine (SEROQUEL) 25 MG tablet Take 0.5 tablets (12.5 mg total) by mouth at bedtime. Patient not taking: Reported on 07/10/2018 02/28/18   Georgette Shell, MD  QUEtiapine (SEROQUEL) 50 MG tablet Take 50 mg by mouth at bedtime. Crush and sprinkle on applesauce 05/20/18   [provider]    I have reviewed the patient's current medications.  Labs:  Results for orders placed or performed during the hospital encounter of 07/04/2018 (from the past 48 hour(s))  Glucose, capillary     Status: Abnormal   Collection Time: 07/16/18  4:32 PM  Result Value Ref Range   Glucose-Capillary 260 (H) 70 - 99 mg/dL  Basic metabolic panel     Status: Abnormal   Collection Time: 07/16/18  6:13 PM  Result Value Ref Range   Sodium 144 135 - 145 mmol/L   Potassium 3.8 3.5 - 5.1 mmol/L   Chloride 115 (H) 98 - 111 mmol/L   CO2 15 (L) 22 - 32 mmol/L   Glucose, Bld 304 (H) 70 - 99 mg/dL   BUN 70 (H) 8 - 23 mg/dL   Creatinine, Ser 3.97 (H) 0.61 - 1.24 mg/dL   Calcium 8.3 (L) 8.9 - 10.3 mg/dL  GFR calc non Af Amer 12 (L) >60 mL/min   GFR calc Af Amer 14 (L) >60 mL/min   Anion gap 14 5 - 15    Comment: Performed at [facility] [address] [place], [place] [address]  CK     Status: Abnormal   Collection Time: 07/16/18  6:13 PM  Result Value Ref Range   Total CK  7,546 (H) 49 - 397 U/L    Comment: RESULTS CONFIRMED BY MANUAL DILUTION Performed at [facility], [place] [address] [place], [place] [address]   CBC with Differential/Platelet     Status: Abnormal   Collection Time: 07/16/18  6:13 PM  Result Value Ref Range   WBC 9.7 4.0 - 10.5 K/uL   RBC 3.40 (L) 4.22 - 5.81 MIL/uL   Hemoglobin 10.1 (L) 13.0 - 17.0 g/dL   HCT 31.9 (L) 39.0 - 52.0 %   MCV 93.8 80.0 - 100.0 fL   MCH 29.7 26.0 - 34.0 pg   MCHC 31.7 30.0 - 36.0 g/dL   RDW 13.9 11.5 - 15.5 %   Platelets  150 - 400 K/uL    PLATELET CLUMPS NOTED ON SMEAR, COUNT APPEARS DECREASED   nRBC 0.2 0.0 - 0.2 %   Neutrophils Relative % 83 %   Neutro Abs 8.0 (H) 1.7 - 7.7 K/uL   Lymphocytes Relative 9 %   Lymphs Abs 0.9 0.7 - 4.0 K/uL   Monocytes Relative 7 %   Monocytes Absolute 0.7 0.1 - 1.0 K/uL   Eosinophils Relative 0 %   Eosinophils Absolute 0.0 0.0 - 0.5 K/uL   Basophils Relative 0 %   Basophils Absolute 0.0 0.0 - 0.1 K/uL   WBC Morphology DOHLE BODIES     Comment: VACUOLATED NEUTROPHILS   Immature Granulocytes 1 %   Abs Immature Granulocytes 0.06 0.00 - 0.07 K/uL   Polychromasia PRESENT     Comment: Performed at [facility], [place] [address] [place], [place] [address]  Expectorated sputum assessment w rflx to resp cult     Status: None   Collection Time: 07/16/18 11:05 PM  Result Value Ref Range   Specimen Description SPUTUM    Special Requests NONE    Sputum evaluation      Sputum specimen not acceptable for testing.  Please recollect.   RESULT CALLED TO, READ BACK BY AND VERIFIED WITH: RN Avie Arenas [phone] FCP Performed at [facility] [address], [place], [place] [address]    Report Status 07/17/2018 FINAL   Glucose, capillary     Status: Abnormal   Collection Time: 07/17/18 12:09 AM  Result Value Ref Range   Glucose-Capillary 155 (H) 70 - 99 mg/dL  Basic metabolic panel     Status: Abnormal   Collection Time: 07/17/18  1:57 AM  Result Value  Ref Range   Sodium 145 135 - 145 mmol/L   Potassium 4.2 3.5 - 5.1 mmol/L   Chloride 114 (H) 98 - 111 mmol/L   CO2 15 (L) 22 - 32 mmol/L   Glucose, Bld 253 (H) 70 - 99 mg/dL   BUN 72 (H) 8 - 23 mg/dL   Creatinine, Ser 4.11 (H) 0.61 - 1.24 mg/dL   Calcium 8.5 (L) 8.9 - 10.3 mg/dL   GFR calc non Af Amer 12 (L) >60 mL/min   GFR calc Af Amer 14 (L) >60 mL/min   Anion gap 16 (H) 5 - 15    Comment: Performed  at [facility], [place] [address], [place], [place] [address]  CK     Status: Abnormal   Collection Time: 07/17/18  1:57 AM  Result Value Ref Range   Total CK 7,195 (H) 49 - 397 U/L    Comment: RESULTS CONFIRMED BY MANUAL DILUTION Performed at [facility], [place] [address] [place], [place] [address]   Glucose, capillary     Status: Abnormal   Collection Time: 07/17/18  5:25 AM  Result Value Ref Range   Glucose-Capillary 113 (H) 70 - 99 mg/dL  Glucose, capillary     Status: Abnormal   Collection Time: 07/17/18  5:34 AM  Result Value Ref Range   Glucose-Capillary 125 (H) 70 - 99 mg/dL  CBC     Status: Abnormal   Collection Time: 07/17/18  6:43 AM  Result Value Ref Range   WBC 10.1 4.0 - 10.5 K/uL   RBC 3.43 (L) 4.22 - 5.81 MIL/uL   Hemoglobin 10.3 (L) 13.0 - 17.0 g/dL   HCT 31.2 (L) 39.0 - 52.0 %   MCV 91.0 80.0 - 100.0 fL   MCH 30.0 26.0 - 34.0 pg   MCHC 33.0 30.0 - 36.0 g/dL   RDW 13.5 11.5 - 15.5 %   Platelets 122 (L) 150 - 400 K/uL   nRBC 0.0 0.0 - 0.2 %    Comment: Performed at [facility], [place] [address] [place], [place] [address]  Basic metabolic panel     Status: Abnormal   Collection Time: 07/17/18  6:48 AM  Result Value Ref Range   Sodium 142 135 - 145 mmol/L   Potassium 3.8 3.5 - 5.1 mmol/L   Chloride 114 (H) 98 - 111 mmol/L   CO2 15 (L) 22 - 32 mmol/L   Glucose, Bld 170 (H) 70 - 99 mg/dL   BUN 70 (H) 8 - 23 mg/dL   Creatinine, Ser 4.07 (H) 0.61 - 1.24 mg/dL   Calcium 8.5 (L) 8.9 - 10.3 mg/dL   GFR calc non Af Amer 12 (L) >60 mL/min   GFR  calc Af Amer 14 (L) >60 mL/min   Anion gap 13 5 - 15    Comment: Performed at [facility] [address] [place], [place] [address]  Glucose, capillary     Status: Abnormal   Collection Time: 07/17/18  7:56 AM  Result Value Ref Range   Glucose-Capillary 192 (H) 70 - 99 mg/dL  Glucose, capillary     Status: Abnormal   Collection Time: 07/17/18 11:33 AM  Result Value Ref Range   Glucose-Capillary 178 (H) 70 - 99 mg/dL  CK     Status: Abnormal   Collection Time: 07/17/18  6:19 PM  Result Value Ref Range   Total CK 6,937 (H) 49 - 397 U/L    Comment: RESULTS CONFIRMED BY MANUAL DILUTION Performed at [facility], [place] [address] [place], [place] [address]   Glucose, capillary     Status: Abnormal   Collection Time: 07/17/18  8:47 PM  Result Value Ref Range   Glucose-Capillary 63 (L) 70 - 99 mg/dL   Comment 1 Repeat Test   Glucose, capillary     Status: Abnormal   Collection Time: 07/17/18  8:51 PM  Result Value Ref Range   Glucose-Capillary 249 (H) 70 - 99 mg/dL  Glucose, capillary     Status: Abnormal   Collection Time: 07/17/18 11:53 PM  Result Value Ref Range   Glucose-Capillary  165 (H) 70 - 99 mg/dL  Culture, respiratory     Status: None (Preliminary result)   Collection Time: 07/18/18 12:00 AM  Result Value Ref Range   Specimen Description TRACHEAL ASPIRATE    Special Requests NONE    Gram Stain      ABUNDANT WBC PRESENT,BOTH PMN AND MONONUCLEAR FEW GRAM VARIABLE ROD RARE GRAM POSITIVE COCCI RARE YEAST Performed at [facility], [place] [address], [place], [place] [address]    Culture PENDING    Report Status PENDING   Glucose, capillary     Status: Abnormal   Collection Time: 07/18/18  4:21 AM  Result Value Ref Range   Glucose-Capillary 136 (H) 70 - 99 mg/dL  Basic metabolic panel     Status: Abnormal   Collection Time: 07/18/18  6:05 AM  Result Value Ref Range   Sodium 142 135 - 145 mmol/L   Potassium 3.5 3.5 - 5.1 mmol/L   Chloride 116 (H) 98  - 111 mmol/L   CO2 15 (L) 22 - 32 mmol/L   Glucose, Bld 165 (H) 70 - 99 mg/dL   BUN 69 (H) 8 - 23 mg/dL   Creatinine, Ser 4.38 (H) 0.61 - 1.24 mg/dL   Calcium 8.2 (L) 8.9 - 10.3 mg/dL   GFR calc non Af Amer 11 (L) >60 mL/min   GFR calc Af Amer 13 (L) >60 mL/min   Anion gap 11 5 - 15    Comment: Performed at [facility] [address] [place], [place] [address]  CBC     Status: Abnormal   Collection Time: 07/18/18  6:05 AM  Result Value Ref Range   WBC 8.8 4.0 - 10.5 K/uL   RBC 3.35 (L) 4.22 - 5.81 MIL/uL   Hemoglobin 9.6 (L) 13.0 - 17.0 g/dL   HCT 30.4 (L) 39.0 - 52.0 %   MCV 90.7 80.0 - 100.0 fL   MCH 28.7 26.0 - 34.0 pg   MCHC 31.6 30.0 - 36.0 g/dL   RDW 13.5 11.5 - 15.5 %   Platelets 129 (L) 150 - 400 K/uL   nRBC 0.2 0.0 - 0.2 %    Comment: Performed at [facility], [place] [address], [place], [place] [address]  CK     Status: Abnormal   Collection Time: 07/18/18  6:05 AM  Result Value Ref Range   Total CK 2,616 (H) 49 - 397 U/L    Comment: RESULTS CONFIRMED BY MANUAL DILUTION Performed at [facility], [place] [address] [place],  [address]   Glucose, capillary     Status: Abnormal   Collection Time: 07/18/18  7:34 AM  Result Value Ref Range   Glucose-Capillary 161 (H) 70 - 99 mg/dL  Glucose, capillary     Status: Abnormal   Collection Time: 07/18/18 11:29 AM  Result Value Ref Range   Glucose-Capillary 177 (H) 70 - 99 mg/dL  Glucose, capillary     Status: Abnormal   Collection Time: 07/18/18  3:45 PM  Result Value Ref Range   Glucose-Capillary 104 (H) 70 - 99 mg/dL     ROS:  Review of systems not obtained due to patient factors.  Physical Exam: Vitals:   07/18/18 1300 07/18/18 1540  BP:  (!) 152/68  Pulse: 75 69  Resp: (!) 22 (!) 26  Temp:  97.6 F (36.4 C)  SpO2:  98%     General: Thin, black male who appears to be in some distress.  He is nonverbal and not responsive to my voice HEENT: Pupils are round and reactive, mucous  membranes are moist Neck: No JVD Heart: Regular rate and rhythm Lungs: Poor effort, mostly clear Abdomen: Soft, nontender, nondistended Extremities: No edema Skin: Warm and dry Neuro: Unresponsive to my exam today  Assessment/Plan: 84 year old black male with advanced CKD at baseline.  He has suffered some acute on chronic injury in the setting of being admitted with hypotension/UTI/rhabdo/volume depletion 1.Renal-creatinine appearing to worsen over the last several days from 3.9 now to 4.4.  BUN around 70.  Per the family he does have good urine output even though it has not been recorded.  GFR is currently calculated at 11.  I do not think that this would lead to his immediate demise but likely will hamper her recovery from everything else.  Patient is not a candidate for dialysis, wife and daughters appear to agree.  Hopefully, kidney function would plateau and recover to his previous poor level.  However, there is nothing else to actively do to make that happen 2. Hypertension/volume  -appears euvolemic.  Previously with hypernatremia but that is corrected.  Given that he has an elevated CK I have no issue with continued IV fluid administration 3.  Elevated CK/rhabdo-due to recent events.  Does seem to have improved with last check 4. Anemia  -not a major issue at this time   Louis Meckel 07/18/2018, 3:56 PM

## 2018-07-18 NOTE — Progress Notes (Signed)
Bilateral upper extremity venous duplex has been completed. Negative for DVT. There is evidence of age indeterminate superficial vein thrombosis involving the cephalic vein of the right upper extremity. There is also evidence of age indeterminate superficial vein thrombosis involving the cephalic and basilic veins of the left upper extremity. Results were given to the patient's nurse, Santiago Glad.  07/18/18 9:42 AM [name] RVT

## 2018-07-18 NOTE — Progress Notes (Signed)
At 1200 pt daughter requested pt prn 12.5 mcg fentanyl because she felt her father was in pain. At 63 pt daughter asked this RN to administer Narcan bc pt was having apneic periods while asleep. Pt was arousalable, but RN did witness periods of apnea when pt would drift back off to sleep. This RN administered 0.4mg  of Narcan and provider was notified. Provider assessed patient at bedside and discussed plan of care with daughters.

## 2018-07-18 NOTE — Progress Notes (Addendum)
Subjective: Overnight, patient was evaluated for concerns of right arm swelling, decreased movement of left leg and left arm, and right upward gaze. Daughter was at bedside this morning. He did not appear in any acute distress but the above findings were noted.   Subsequently, he was noted to have apneic episodes this afternoon. Please see problem #2 below.     Objective:  Vital signs in last 24 hours: Vitals:   07/17/18 2357 07/18/18 0557 07/18/18 0603 07/18/18 0736  BP: (!) 125/41 (!) 140/57  (!) 151/64  Pulse: 76 78  79  Resp: (!) 38 (!) 32  (!) 34  Temp: 98 F (36.7 C)  98.8 F (37.1 C) 97.7 F (36.5 C)  TempSrc:   Oral Axillary  SpO2:  100%  100%  Weight:       Gen: laying in bed, mouth breathing, daughter at bedside Card: RRR, no extra heart sounds Pulm: anterior lung fields are CTAB Abd: soft, NT, ND, +BS Ext: no LEE, warm. Right arm notably swollen, nonpitting, nonerythematous. Bandages covering blisters on right forearm.  Neuro: no voluntary movement of left leg, no rigidity, head is turned to the right and gaze is up and to the right. 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 On presentation is hypothermic and tachypneic. Found to have U/A positive for leuks and bacteria.  Elevated lactic acid at 3. BMP revealed worsening hypernatremia to 159, as well AKI on CKD.    Skin rash: erythema and superficial blistering of right forearm and left hip/thigh. Could be related to metronidazole which was started yesterday - discontinue metronidazole - try to consult dermatology over the phone, per family request   Increased upper respiratory secretions and apneic episodes: Family notes increased suctioning requirement with thick, yellow sputum. He is saturating well on room air but had intermittent apneic episodes this afternoon requiring sternal rub and was given one dose of narcan with subsequent improvement. He has been receiving 12.5mg  fentanyl q2h prn for pain of which he received 4 doses yesterday and 1 dose today.  - CT sinus   - prn narcan  - CPAP prn qhs   Hypernatremia: Improving - Na 142, stable from yesterday  - Increase fluids to 200cc/hr D5 1/2 NS with careful assessment of volume status  Aspiration Pneumonia: CXR showed left lower lobe consolidation  - Ceftriaxone 12/13 - present - Discontinue metronidazole since he is developing a bullous skin reaction. - f/u respiratory culture: growing gram variable rods, GPC, and yeast  - WBC trending down, could be dilutional though. Continue to monitor   UTI: UCx growing multiple species - continue IV ceftriaxone (12/13 - present) - BCx NG at 4 days  AKI on CKD: Baseline creatine 3.0 - creatine worsening to 4.38 today  - family reports adequate urine  output  - IVFs as above - nephrology consult  Neuroleptic malignant syndrome: in the setting of seroquel and geodon use, muscular rigidity, fever, elevated CK, AMS. Discontinue seroquel and geodon - febrile this morning to 101.38F, given rectal tylenol - rigidity and facial twitching have improved - CK slowly downtrending 7546 -> 7195 -> 6937 - f/u CK from this morning  - increase IVFs as above - consulted neurology: Discussed use of dantrolene and they say to  only use dantrolene if he remains consistently hyperthermic, lead pipe rigidity, decreased AMS, and hyperreflexic. They have seen CK > 50,000 in cases like this. It is expected to take several days to clear the CK. The fact that it is downtrending is reassuring. Continue supportive management.   Neurologic Deficits: decreased movement of left leg and arm and gaze is shifted up and to the right. Prior head CT for decreased left leg movement was unremarkable for acute pathology.  - repeat head CT today - EEG  Bilateral arm swelling: R>L, evolved overnight. IV likely infiltrated in the left arm, causing some swelling which is not that remarkable on exam. Right arm is notable swollen, non-pitting, non-erythematous. Voluntary movement is present of right arm.  - bilateral upper extremity ultrasound to rule out DVT --> negative for acute DVT  Chronic Issues Stable:   T2DM: CBGs 136-165 overnight - lantus 7 units daily - sensitive SSI novolog   Advanced dementia with acute delirium: home meds include geodon, seroquel, depakote - holding geodon and seroquel due to suspected NMS - okay to give depakote - if agitation becomes problematic, can give low dose lorazepam.   HTN: hypertensive today. Holding home atenolol and amlodipine and imdur  - not able to take po meds - start IV hydralazine 5mg  TID prn SBP > 150  HFrEF: hypertensive but not volume overloaded on exam. Will monitor volume status while giving IVFs - holding home lasix   Diet: advance to regular at family's request  IVFs: D5 1/2 NS 200cc/hr x 8hrs, then reassess volume status    Dispo: Anticipated discharge in approximately 1-2 day(s).   Isabelle Course, MD 07/18/2018, 8:27 AM Pager: [phone]

## 2018-07-18 NOTE — Progress Notes (Signed)
Report given to [name] and we went in to see patient and look at skin and issues that daughter wanted addressed. Patient still looks fairly comfortable at this time.

## 2018-07-18 NOTE — Progress Notes (Signed)
Pt SPO2 96-100% on RA. [place] applied per family request. Daughter feels the O2 was not on because it was overlooked/forgotten, however pt has not actually required O2.

## 2018-07-18 NOTE — Progress Notes (Signed)
Pt daughter refused cogentin before CT scan. She felt it would "interfere with results".

## 2018-07-18 NOTE — Progress Notes (Signed)
Temp 101.5 axillary, rechecked with another thermometer and it was 100.8, got a Tylenol Supp and asked daughter if she wanted me to get a rectal temp, upon turning him, noticed that his right arm was swollen and tight with some fluid filled blisters. MD at bedside at this time. IV removed with catheter intact and foam placed over site D/T skin tears with removal of tape, elevated arm on pillows and put 2 hot packs on while daughter was speaking to the MD. IVF's D/C'd for now and will restart after new IV placed.

## 2018-07-18 NOTE — Progress Notes (Signed)
Pt daughters refuse external output collection. Pt is incontinent, therefore output can not be measured and charted.

## 2018-07-18 NOTE — Progress Notes (Signed)
EEG completed; results pending.    

## 2018-07-19 ENCOUNTER — Inpatient Hospital Stay (HOSPITAL_COMMUNITY): Payer: Medicare Other

## 2018-07-19 DIAGNOSIS — E43 Unspecified severe protein-calorie malnutrition: Secondary | ICD-10-CM

## 2018-07-19 LAB — CULTURE, BLOOD (ROUTINE X 2)
CULTURE: NO GROWTH
CULTURE: NO GROWTH
SPECIAL REQUESTS: ADEQUATE
Special Requests: ADEQUATE

## 2018-07-19 LAB — URINALYSIS, ROUTINE W REFLEX MICROSCOPIC
Bilirubin Urine: NEGATIVE
Glucose, UA: 50 mg/dL — AB
Ketones, ur: NEGATIVE mg/dL
Nitrite: NEGATIVE
Protein, ur: 100 mg/dL — AB
Specific Gravity, Urine: 1.012 (ref 1.005–1.030)
WBC, UA: >50 WBC/hpf — ABNORMAL HIGH (ref 0–5)
pH: 5 (ref 5.0–8.0)

## 2018-07-19 LAB — GLUCOSE, CAPILLARY
GLUCOSE-CAPILLARY: 238 mg/dL — AB (ref 70–99)
GLUCOSE-CAPILLARY: 132 mg/dL — AB (ref 70–99)
Glucose-Capillary: 188 mg/dL — ABNORMAL HIGH (ref 70–99)
Glucose-Capillary: 267 mg/dL — ABNORMAL HIGH (ref 70–99)
Glucose-Capillary: 282 mg/dL — ABNORMAL HIGH (ref 70–99)
Glucose-Capillary: 87 mg/dL (ref 70–99)

## 2018-07-19 LAB — RENAL FUNCTION PANEL
Albumin: 2 g/dL — ABNORMAL LOW (ref 3.5–5.0)
Anion gap: 10 (ref 5–15)
BUN: 69 mg/dL — ABNORMAL HIGH (ref 8–23)
CO2: 14 mmol/L — ABNORMAL LOW (ref 22–32)
Calcium: 8.1 mg/dL — ABNORMAL LOW (ref 8.9–10.3)
Chloride: 115 mmol/L — ABNORMAL HIGH (ref 98–111)
Creatinine, Ser: 4.38 mg/dL — ABNORMAL HIGH (ref 0.61–1.24)
GFR calc Af Amer: 13 mL/min — ABNORMAL LOW (ref >60)
GFR calc non Af Amer: 11 mL/min — ABNORMAL LOW (ref >60)
GLUCOSE: 325 mg/dL — AB (ref 70–99)
PHOSPHORUS: 5.3 mg/dL — AB (ref 2.5–4.6)
POTASSIUM: 4 mmol/L (ref 3.5–5.1)
Sodium: 139 mmol/L (ref 135–145)

## 2018-07-19 LAB — CK: Total CK: 4095 U/L — ABNORMAL HIGH (ref 49–397)

## 2018-07-19 MED ORDER — FUROSEMIDE 10 MG/ML IJ SOLN
INTRAMUSCULAR | Status: AC
  Administered 2018-07-19: 40 mg
  Filled 2018-07-19: qty 4

## 2018-07-19 MED ORDER — FENTANYL CITRATE (PF) 100 MCG/2ML IJ SOLN: 6.2500 ug | INTRAMUSCULAR | Status: DC | PRN

## 2018-07-19 MED ORDER — FUROSEMIDE 10 MG/ML IJ SOLN
40.0000 mg | Freq: Once | INTRAMUSCULAR | Status: DC
  Filled 2018-07-19: qty 4

## 2018-07-19 MED ORDER — SODIUM BICARBONATE 8.4 % IV SOLN
50.0000 meq | Freq: Once | INTRAVENOUS | Status: AC
  Administered 2018-07-19: 50 meq via INTRAVENOUS
  Filled 2018-07-19: qty 50

## 2018-07-19 MED ORDER — VALPROATE SODIUM 500 MG/5ML IV SOLN
40.0000 mg | Freq: Three times a day (TID) | INTRAVENOUS | Status: DC
  Filled 2018-07-19 (×2): qty 0.4

## 2018-07-19 MED ORDER — DEXTROSE-NACL 5-0.45 % IV SOLN
INTRAVENOUS | Status: AC
  Administered 2018-07-19: 12:00:00 via INTRAVENOUS

## 2018-07-19 MED ORDER — JEVITY 1.2 CAL PO LIQD: 1000.0000 mL | ORAL | Status: DC

## 2018-07-19 MED ORDER — VALPROATE SODIUM 500 MG/5ML IV SOLN
40.0000 mg | Freq: Three times a day (TID) | INTRAVENOUS | Status: DC | PRN
  Filled 2018-07-19: qty 0.4

## 2018-07-19 MED ORDER — GLUCERNA 1.2 CAL PO LIQD
1000.0000 mL | ORAL | Status: DC
  Administered 2018-07-19: 1000 mL
  Filled 2018-07-19 (×3): qty 1000

## 2018-07-19 MED ORDER — RAMELTEON 8 MG PO TABS
8.0000 mg | ORAL_TABLET | Freq: Every evening | ORAL | Status: DC | PRN
  Filled 2018-07-19: qty 1

## 2018-07-19 NOTE — Procedures (Signed)
Cortrak  Person Inserting Tube:  [name], RD Tube Type:  Cortrak - 55 inches Tube Location:  Left nare Initial Placement:  Stomach Secured by: Bridle Technique Used to Measure Tube Placement:  Documented cm marking at nare/ corner of mouth Cortrak Secured At:  61 cm    Cortrak Tube Team Note:  Consult received to place a Cortrak feeding tube.   X-ray is required, abdominal x-ray has been ordered by the Cortrak team. Please confirm tube placement before using the Cortrak tube.   If the tube becomes dislodged please keep the tube and contact the Cortrak team at [email or web address] ([email or web address]) for replacement.  If after hours and replacement cannot be delayed, place a NG tube and confirm placement with an abdominal x-ray.      Paul Matin, MS, RD, LDN, [facility] Inpatient Clinical Dietitian Pager # [phone] After hours/weekend pager # [phone]

## 2018-07-19 NOTE — Plan of Care (Signed)
Bladder scan and I and O cath per MD order for retention. Paced Rhythm on Monitor.

## 2018-07-19 NOTE — Progress Notes (Signed)
Inpatient Diabetes Program Recommendations  AACE/ADA: New Consensus Statement on Inpatient Glycemic Control (2015)  Target Ranges:  Prepandial:   less than 140 mg/dL      Peak postprandial:   less than 180 mg/dL (1-2 hours)      Critically ill patients:  140 - 180 mg/dL   Lab Results  Component Value Date   GLUCAP 238 (H) 07/19/2018   HGBA1C 7.3 (H) 02/27/2018    Review of Glycemic Control Results for MALCOMB, GANGEMI (MRN [id number]) as of 07/19/2018 11:19  Ref. Range 07/18/2018 20:35 07/19/2018 00:54 07/19/2018 04:52 07/19/2018 08:14  Glucose-Capillary Latest Ref Range: 70 - 99 mg/dL 176 (H) 267 (H) 282 (H) 238 (H)   Diabetes history: Type 2 DM Outpatient Diabetes medications: Lantus 8-20 units QD, Tradjenta 5 mg QD Current orders for Inpatient glycemic control: Lantus 7 units QD, Novolog 0-9 units Q4H  Inpatient Diabetes Program Recommendations:    Consider increasing Lantus to 9 units QD.   Thanks, Bronson Curb, MSN, RNC-OB Diabetes Coordinator [phone] (8a-5p)

## 2018-07-19 NOTE — Progress Notes (Signed)
RD into room to insert Cortrak. Pt's daughter requesting Fentanyl IV for comfort with Tube insertion. Pt's daughter asking if we still have Narcan ordered to give if Fentanyl depresses respirations. Contacted Dr. [name] regarding the administration of narcotic if known respiratory decline with med has been noted previously. Per Dr. [name], okay to attempt cortrak without prior sedation d/t risks.

## 2018-07-19 NOTE — Progress Notes (Signed)
  Date: 07/19/2018  Patient name: JOHNELL BAS  Medical record number: [id number]  Date of birth: March 13, 1923   I have seen and evaluated this patient and I have discussed the plan of care with the house staff. Please see Dr. Cristal Generous note for complete details. I concur with her findings.  [name], MD 07/19/2018, 9:51 PM

## 2018-07-19 NOTE — Progress Notes (Signed)
Subjective: [name]. Was placed on cpap. Tachypneic this morning. Family noting decreased urine output.   Objective:  Vital signs in last 24 hours: Vitals:   07/19/18 0335 07/19/18 0617 07/19/18 0813 07/19/18 1127  BP: 104/70 121/66 (!) 145/57 (!) 141/63  Pulse: 63 71 (!) 126 96  Resp: (!) 33 (!) 22 (!) 34 (!) 29  Temp: 98.8 F (37.1 C)  98.1 F (36.7 C) 98.4 F (36.9 C)  TempSrc: Oral  Oral Oral  SpO2: 90% 100% 100%   Weight: 61 kg      Gen: laying in bed, NAD, alert  Pulm: tachypnic, good air movement throughout, CTAB Cardiac: elevated JVD, RRR, no S3, no m/r/g Abd: soft, grimacing with palpation of suprapubic region, BS+ Ext: elevated, no LEE, warm    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 On presentation is hypothermic and tachypneic. Found to have U/A positive for leuks and bacteria. Elevated lactic acid at 3. BMP revealed worsening hypernatremia to 159, as well AKI on CKD.    Metabolic encephalopathy: Likely due to infection (UTI, probable pneumonia), decreased po intake, on top of geodon use. Mental status is stable and improving. Hypernatremia has resolved. Remains afebrile without leukocytosis. UTI and pneumonia being treated with ceftriaxone. EEG was negative for seizure like activity. Head CT x 2 have been negative for acute pathology. Respiratory culture and urine culture unhelpful.  -  ceftriaxone 12/13 - present - repeat UA and urine culture  AKI on CKD: Baseline creatine 3.0 - creatine unchanged from yesterday - CK elevated to 4,095. Suspect yesterdays 2,616 was a lab error. However, overall, CK continues to down trend from initial 7,546. Will recheck tomorrow  - concerns for urinary retention given decreased output and anticholinergic use (cogentin). - bladder scan: 390cc, I&O cath with 400cc urine - f/u UA and urine culture   HFrEF: normotensive. Initially, he was more tachypnic and JVD elevated this morning. However, breathing improved after I&O cath removed 400cc urine. His lungs were CTAB and he has no LEE.  - hold lasix - continue to monitor volume status    Chronic Issues Stable:   T2DM: CBGs 123-282 overnight - lantus 7 units daily - sensitive SSI novolog   Advanced dementia with acute delirium: home meds include geodon, seroquel, depakote - holding geodon and seroquel due to suspected NMS - okay to give depakote - if agitation becomes problematic, can give low dose lorazepam.  - discontinue benztropine. Dystonia has resolved and I worry that his new urinary retention is being caused by anticholinergic side effects of this medication  HTN: hypertensive today. Holding home atenolol and amlodipine and imdur  - not able to take po meds - start IV hydralazine 5mg  TID prn SBP > 150   Diet: consult nutrition for NG tube  IVFs: D5 1/2 NS 100cc/hr x 8hrs + 13meq bicarb DVT ppx: SCDs    Dispo: Anticipated discharge in approximately 4 day(s).  Isabelle Course, MD 07/19/2018, 12:58 PM Pager: [phone]

## 2018-07-19 NOTE — Progress Notes (Signed)
Pt placed on CPAP for the night at the request of family.  Pt tolerating well at this time with supplemental oxygen bled into system.

## 2018-07-19 NOTE — Progress Notes (Signed)
Subjective:  Unable to record urine due to incont- then had bladder scan with 400- req in and out cath - BUN and crt exactly the same as yesterday- CK is up    Objective Vital signs in last 24 hours: Vitals:   07/19/18 0335 07/19/18 0617 07/19/18 0813 07/19/18 1127  BP: 104/70 121/66 (!) 145/57 (!) 141/63  Pulse: 63 71 (!) 126 96  Resp: (!) 33 (!) 22 (!) 34 (!) 29  Temp: 98.8 F (37.1 C)  98.1 F (36.7 C) 98.4 F (36.9 C)  TempSrc: Oral  Oral Oral  SpO2: 90% 100% 100%   Weight: 61 kg      Weight change:   Intake/Output Summary (Last 24 hours) at 07/19/2018 1251 Last data filed at 07/19/2018 1131 Gross per 24 hour  Intake 1777.41 ml  Output 790 ml  Net 987.41 ml    Assessment/Plan: 84 year old black male with advanced CKD at baseline.  He has suffered some acute on chronic injury in the setting of being admitted with hypotension/UTI/rhabdo/volume depletion 1.Renal-creatinine appearing to worsen over the last several days,  from 3.9 now to 4.4.  BUN around 70.  Per the family he does have good urine output even though it has not been recorded.  GFR is currently calculated at 11.  I do not think that this would lead to his immediate demise but likely will hamper her recovery from everything else.  Patient is not a candidate for dialysis, wife and daughters appear to agree.  Hopefully, kidney function would plateau and recover to his previous poor level.  However, there is nothing else to actively do to make that happen.  Stayed the same from yest to today  2. Hypertension/volume  -appears euvolemic.  Previously with hypernatremia but that is corrected.  Given that he has an elevated CK I have no issue with continued IV fluid administration- CK  went down to 2600- up to 4000 today - maybe from inactivity -lasix also ordered, for rhabdo type protocol I guess 3.  Elevated CK/rhabdo-due to recent events.  Does seem to have improved but higher today  4. Anemia  -not a major issue at this  time    [place]: Basic Metabolic Panel: Recent Labs  Lab 07/17/18 0648 07/18/18 0605 07/19/18 0341  NA 142 142 139  K 3.8 3.5 4.0  CL 114* 116* 115*  CO2 15* 15* 14*  GLUCOSE 170* 165* 325*  BUN 70* 69* 69*  CREATININE 4.07* 4.38* 4.38*  CALCIUM 8.5* 8.2* 8.1*  PHOS  --   --  5.3*   Liver Function Tests: Recent Labs  Lab 07/29/2018 1755 07/19/18 0341  AST 50*  --   ALT 20  --   ALKPHOS 68  --   BILITOT 1.0  --   PROT 7.3  --   ALBUMIN 3.3* 2.0*   No results for input(s): LIPASE, AMYLASE in the last 168 hours. No results for input(s): AMMONIA in the last 168 hours. CBC: Recent Labs  Lab 07/23/2018 1755 07/15/18 0516 07/15/18 2352 07/16/18 1813 07/17/18 0643 07/18/18 0605  WBC 8.5 8.4 9.4 9.7 10.1 8.8  NEUTROABS 7.6  --  7.8* 8.0*  --   --   HGB 11.9* 10.4* 10.5* 10.1* 10.3* 9.6*  HCT 38.9* 33.8* 33.5* 31.9* 31.2* 30.4*  MCV 96.3 96.8 93.8 93.8 91.0 90.7  PLT 190 157 138* PLATELET CLUMPS NOTED ON SMEAR, COUNT APPEARS DECREASED 122* 129*   Cardiac Enzymes: Recent Labs  Lab 07/16/18 1813 07/17/18 0157 07/17/18 1819 07/18/18 0605 07/19/18 0341  CKTOTAL 7,546* 7,195* 6,937* 2,616* 4,095*   CBG: Recent Labs  Lab 07/18/18 2035 07/19/18 0054 07/19/18 0452 07/19/18 0814 07/19/18 1124  GLUCAP 176* 267* 282* 238* 132*    Iron Studies: No results for input(s): IRON, TIBC, TRANSFERRIN, FERRITIN in the last 72 hours. Studies/Results: Ct Head Wo Contrast  Result Date: 07/18/2018 CLINICAL DATA:  84 year old with acute mental status changes. Diminished activity involving the LEFT LOWER extremity and rightward gaze. EXAM: CT HEAD WITHOUT CONTRAST TECHNIQUE: Contiguous axial images were obtained from the base of the skull through the vertex without intravenous contrast. COMPARISON:  07/16/2018, 02/27/2018 and earlier. FINDINGS: Patient motion blurred many of the images though the study does appear diagnostic. Brain: Severe age related  cortical, deep and cerebellar atrophy. Severe changes of small vessel disease of the white matter diffusely, including the brainstem and pons. No mass lesion. No midline shift. No acute hemorrhage or hematoma. No extra-axial fluid collections. No evidence of acute infarction. Vascular: Severe BILATERAL carotid siphon and vertebrobasilar atherosclerosis. Atherosclerosis involving the RIGHT M1 segment. No hyperdense vessel. Skull: No skull fracture or other focal osseous abnormality involving the skull. Sinuses/Orbits: Visualized paranasal sinuses, bilateral mastoid air cells and bilateral middle ear cavities well-aerated. Visualized orbits and globes normal in appearance. Other: None. IMPRESSION: 1. Motion degraded examination demonstrates no acute intracranial abnormality. 2. Severe age related generalized atrophy and severe chronic microvascular ischemic changes of the white matter. In the that is 2 Electronically Signed   By: Evangeline Dakin M.D.   On: 07/18/2018 14:45   Vas [place] Upper Extremity Venous Duplex  Result Date: 07/18/2018 UPPER VENOUS STUDY  Indications: Swelling Limitations: Poor ultrasound/tissue interface, bandages, open wound and patient cooperation. Performing Technologist: Oliver Hum RVT  Examination Guidelines: A complete evaluation includes B-mode imaging, spectral Doppler, color Doppler, and power Doppler as needed of all accessible portions of each vessel. Bilateral testing is considered an integral part of a complete examination. Limited examinations for reoccurring indications may be performed as noted.  Right Findings: +----------+------------+----------+---------+-----------+-----------------+ RIGHT     CompressiblePropertiesPhasicitySpontaneous     Summary      +----------+------------+----------+---------+-----------+-----------------+ IJV           Full                 Yes       Yes                       +----------+------------+----------+---------+-----------+-----------------+ Subclavian    Full                 Yes       Yes                      +----------+------------+----------+---------+-----------+-----------------+ Axillary      Full                 Yes       Yes                      +----------+------------+----------+---------+-----------+-----------------+ Brachial      Full                 Yes       Yes                      +----------+------------+----------+---------+-----------+-----------------+ Radial        Full                                                    +----------+------------+----------+---------+-----------+-----------------+  Ulnar         Full                                                    +----------+------------+----------+---------+-----------+-----------------+ Cephalic      None                                  Age Indeterminate +----------+------------+----------+---------+-----------+-----------------+ Basilic       Full                                                    +----------+------------+----------+---------+-----------+-----------------+  Left Findings: +----------+------------+----------+---------+-----------+-----------------+ LEFT      CompressiblePropertiesPhasicitySpontaneous     Summary      +----------+------------+----------+---------+-----------+-----------------+ IJV           Full                 Yes       Yes                      +----------+------------+----------+---------+-----------+-----------------+ Subclavian    Full                 Yes       Yes                      +----------+------------+----------+---------+-----------+-----------------+ Axillary      Full                 Yes       Yes                      +----------+------------+----------+---------+-----------+-----------------+ Brachial      Full                 Yes       Yes                       +----------+------------+----------+---------+-----------+-----------------+ Radial        Full                                                    +----------+------------+----------+---------+-----------+-----------------+ Ulnar         Full                                                    +----------+------------+----------+---------+-----------+-----------------+ Cephalic      None                                  Age Indeterminate +----------+------------+----------+---------+-----------+-----------------+ Basilic       None  Age Indeterminate +----------+------------+----------+---------+-----------+-----------------+  Summary:  Right: No evidence of deep vein thrombosis in the upper extremity. Findings consistent with age indeterminate superficial vein thrombosis involving the right cephalic vein.  Left: No evidence of deep vein thrombosis in the upper extremity. Findings consistent with age indeterminate superficial vein thrombosis involving the left cephalic vein and left basilic vein.  *See table(s) above for measurements and observations.  Diagnosing physician: Deitra Mayo MD Electronically signed by Deitra Mayo MD on 07/18/2018 at 4:36:28 PM.    Final    Ct Maxillofacial Wo Contrast  Result Date: 07/18/2018 CLINICAL DATA:  Sinus drainage with fever, question sinus infection. EXAM: CT MAXILLOFACIAL WITHOUT CONTRAST TECHNIQUE: Multidetector CT imaging of the maxillofacial structures was performed. Multiplanar CT image reconstructions were also generated. COMPARISON:  None. FINDINGS: Osseous: No acute fracture bone destruction. Orbits: Intact orbits and globes with bilateral lens replacements. No retrobulbar abnormality. No acute inflammatory process. Sinuses: Mild ethmoid and mild-to-moderate sphenoid sinus mucosal thickening. The frontal sinuses and mastoids appear clear. Soft tissues: No significant soft tissue swelling.  Limited intracranial: Atrophy with chronic small vessel ischemia. IMPRESSION: 1. Mild ethmoid and mild to moderate sphenoid sinus mucosal thickening. No air-fluid levels to suggest acute sinusitis. 2. No acute maxillofacial fracture. Electronically Signed   By: Ashley Royalty M.D.   On: 07/18/2018 15:17   Medications: Infusions: . sodium chloride 250 mL (07/18/18 1225)  . cefTRIAXone (ROCEPHIN)  IV 1 g (07/19/18 0037)  . dextrose 5 % and 0.45% NaCl 100 mL/hr at 07/19/18 1220  . valproate sodium      Scheduled Medications: . fluticasone  2 spray Each Nare Daily  . insulin aspart  0-9 Units Subcutaneous Q4H  . insulin glargine  7 Units Subcutaneous Daily  . isosorbide-hydrALAZINE  1 tablet Oral Daily  . QUEtiapine  50 mg Oral q1800  . sodium chloride flush  3 mL Intravenous Q12H    have reviewed scheduled and prn medications.  Physical Exam: General: pt unresponsive- some jerking Heart: RRR Lungs: mostly clear Abdomen:  thin Extremities: no appreciable edema    07/19/2018,12:51 PM  LOS: 5 days

## 2018-07-19 NOTE — Progress Notes (Signed)
Bladder scan done for retention shows 390 ml urine. Dr. [name] notified. Order received for I and O cath and urinalysis. Pt had 400 ml concentrated urine out with cath. U/A sent.

## 2018-07-19 NOTE — Progress Notes (Addendum)
Nutrition Follow-up  DOCUMENTATION CODES:   Severe malnutrition in context of chronic illness  INTERVENTION:   Begin TF after Cortrak is placed:  Glucerna 1.2 at 60 ml/h (1440 ml per day)  Provides 1728 kcal, 86 gm protein, 1159 ml free water daily  NUTRITION DIAGNOSIS:   Severe Malnutrition related to chronic illness(dementia, CHF, CKD) as evidenced by severe fat depletion, severe muscle depletion.  Ongoing  GOAL:   Patient will meet greater than or equal to 90% of their needs  Unmet  MONITOR:   TF tolerance, PO intake  REASON FOR ASSESSMENT:   Consult Enteral/tube feeding initiation and management  ASSESSMENT:   84 year-old man with PMHx CAD, CKD stage IV, dementia, Type 2 DM, HTN, CHF, tachybrady syndrome s/p pacemaker. He presented to the ED for AMS and urinary incontinence for the past week. Patient found to have AKI and dx with sepsis.   Plans to begin TF today after Cortrak is placed.  Spoke with patient's daughter in his room regarding Cortrak tube placement and initiation of TF. She wants to attempt placement of a NG tube to see if he can tolerate the tube placement and tube feeding.  She is a little concerned that he will pull the tube out and that the TF may worsen his loose stools. Discussed bridle placement after Cortrak is placed. She thinks he will need "fentanyl" to calm him before the tube is placed.   Patient remains on a regular diet, but intake is minimal due to confusion. Meal completion documented as 0% for the past few days.  Labs reviewed. Phosphorus 5.3 (H) CBG's: [phone] Medications reviewed and include novolog, lantus.   Nutrition Focused Physical Exam:  Subcutaneous Fat:  Orbital Region: severe depletion Upper Arm Region: severe depletion Thoracic and Lumbar Region: unable to assess Buccal Region: moderate depletion  Muscle:  Temple Region: severe depletion Clavicle Bone Region: severe depletion Clavicle and Acromion Bone  Region: unable to to assess Scapular Bone Region: unable to assess Dorsal Hand: unable to assess Patellar Region: moderate depletion Anterior Thigh Region: severe depletion Posterior Calf Region: severe depletion  Edema: none   Diet Order:   Diet Order            Diet regular Room service appropriate? Yes; Fluid consistency: Thin  Diet effective now              EDUCATION NEEDS:   Not appropriate for education at this time  Skin:  Skin Assessment: Reviewed RN Assessment  Last BM:  12/18 (type 7)  Height:   Ht Readings from Last 1 Encounters:  05/21/18 5\' 11"  (1.803 m)    Weight:   Wt Readings from Last 1 Encounters:  07/19/18 61 kg    Ideal Body Weight:  78.18 kg  BMI:  Body mass index is 18.76 kg/m.  Estimated Nutritional Needs:   Kcal:  1700-1900  Protein:  85-95 gm  Fluid:  >/= 1.6 L/day    Molli Barrows, RD, LDN, [place] Pager [phone] After Hours Pager [phone]

## 2018-07-20 ENCOUNTER — Inpatient Hospital Stay (HOSPITAL_COMMUNITY): Payer: Medicare Other

## 2018-07-20 DIAGNOSIS — R339 Retention of urine, unspecified: Secondary | ICD-10-CM

## 2018-07-20 DIAGNOSIS — D72829 Elevated white blood cell count, unspecified: Secondary | ICD-10-CM

## 2018-07-20 DIAGNOSIS — R531 Weakness: Secondary | ICD-10-CM

## 2018-07-20 DIAGNOSIS — R197 Diarrhea, unspecified: Secondary | ICD-10-CM

## 2018-07-20 DIAGNOSIS — R0902 Hypoxemia: Secondary | ICD-10-CM

## 2018-07-20 LAB — RENAL FUNCTION PANEL
Albumin: 2 g/dL — ABNORMAL LOW (ref 3.5–5.0)
Anion gap: 17 — ABNORMAL HIGH (ref 5–15)
BUN: 75 mg/dL — AB (ref 8–23)
CO2: 13 mmol/L — ABNORMAL LOW (ref 22–32)
Calcium: 8.6 mg/dL — ABNORMAL LOW (ref 8.9–10.3)
Chloride: 117 mmol/L — ABNORMAL HIGH (ref 98–111)
Creatinine, Ser: 5.05 mg/dL — ABNORMAL HIGH (ref 0.61–1.24)
GFR calc Af Amer: 11 mL/min — ABNORMAL LOW (ref >60)
GFR calc non Af Amer: 9 mL/min — ABNORMAL LOW (ref >60)
Glucose, Bld: 213 mg/dL — ABNORMAL HIGH (ref 70–99)
PHOSPHORUS: 6.3 mg/dL — AB (ref 2.5–4.6)
Potassium: 4.1 mmol/L (ref 3.5–5.1)
Sodium: 147 mmol/L — ABNORMAL HIGH (ref 135–145)

## 2018-07-20 LAB — GLUCOSE, CAPILLARY
GLUCOSE-CAPILLARY: 164 mg/dL — AB (ref 70–99)
Glucose-Capillary: 119 mg/dL — ABNORMAL HIGH (ref 70–99)
Glucose-Capillary: 127 mg/dL — ABNORMAL HIGH (ref 70–99)
Glucose-Capillary: 133 mg/dL — ABNORMAL HIGH (ref 70–99)
Glucose-Capillary: 154 mg/dL — ABNORMAL HIGH (ref 70–99)
Glucose-Capillary: 176 mg/dL — ABNORMAL HIGH (ref 70–99)
Glucose-Capillary: 183 mg/dL — ABNORMAL HIGH (ref 70–99)
Glucose-Capillary: 227 mg/dL — ABNORMAL HIGH (ref 70–99)
Glucose-Capillary: 48 mg/dL — ABNORMAL LOW (ref 70–99)

## 2018-07-20 LAB — BLOOD GAS, ARTERIAL
Acid-base deficit: 13 mmol/L — ABNORMAL HIGH (ref 0.0–2.0)
Bicarbonate: 11.4 mmol/L — ABNORMAL LOW (ref 20.0–28.0)
Drawn by: 311011
O2 Saturation: 98.6 %
PCO2 ART: 20.4 mmHg — AB (ref 32.0–48.0)
Patient temperature: 98.8
pH, Arterial: 7.365 (ref 7.350–7.450)
pO2, Arterial: 120 mmHg — ABNORMAL HIGH (ref 83.0–108.0)

## 2018-07-20 LAB — STREP PNEUMONIAE URINARY ANTIGEN: Strep Pneumo Urinary Antigen: NEGATIVE

## 2018-07-20 LAB — CBC
HCT: 31.3 % — ABNORMAL LOW (ref 39.0–52.0)
Hemoglobin: 10.2 g/dL — ABNORMAL LOW (ref 13.0–17.0)
MCH: 29.9 pg (ref 26.0–34.0)
MCHC: 32.6 g/dL (ref 30.0–36.0)
MCV: 91.8 fL (ref 80.0–100.0)
Platelets: 138 10*3/uL — ABNORMAL LOW (ref 150–400)
RBC: 3.41 MIL/uL — ABNORMAL LOW (ref 4.22–5.81)
RDW: 13.8 % (ref 11.5–15.5)
WBC: 13.4 10*3/uL — ABNORMAL HIGH (ref 4.0–10.5)
nRBC: 0.1 % (ref 0.0–0.2)

## 2018-07-20 LAB — CULTURE, RESPIRATORY

## 2018-07-20 LAB — CULTURE, RESPIRATORY W GRAM STAIN

## 2018-07-20 LAB — URINE CULTURE
Culture: NO GROWTH
Special Requests: NORMAL

## 2018-07-20 LAB — CK: Total CK: 3145 U/L — ABNORMAL HIGH (ref 49–397)

## 2018-07-20 LAB — LACTIC ACID, PLASMA: Lactic Acid, Venous: 5 mmol/L (ref 0.5–1.9)

## 2018-07-20 MED ORDER — DEXTROSE 50 % IV SOLN
INTRAVENOUS | Status: AC
  Administered 2018-07-20: 50 mL
  Filled 2018-07-20: qty 50

## 2018-07-20 MED ORDER — LORAZEPAM 2 MG/ML IJ SOLN
0.5000 mg | Freq: Once | INTRAMUSCULAR | Status: AC | PRN
  Administered 2018-07-20: 0.5 mg via INTRAVENOUS
  Filled 2018-07-20: qty 1

## 2018-07-20 MED ORDER — DIPHENHYDRAMINE HCL 12.5 MG/5ML PO ELIX: 6.2500 mg | ORAL_SOLUTION | Freq: Once | ORAL | Status: DC

## 2018-07-20 MED ORDER — SODIUM CHLORIDE 0.45 % IV SOLN
INTRAVENOUS | Status: DC
  Administered 2018-07-20: 12:00:00 via INTRAVENOUS

## 2018-07-20 MED ORDER — DEXTROSE-NACL 5-0.45 % IV SOLN
INTRAVENOUS | Status: DC
  Administered 2018-07-20: 22:00:00 via INTRAVENOUS

## 2018-07-20 MED ORDER — SODIUM CHLORIDE 0.45 % IV SOLN: INTRAVENOUS | Status: DC

## 2018-07-20 MED ORDER — PIPERACILLIN-TAZOBACTAM IN DEX 2-0.25 GM/50ML IV SOLN
2.2500 g | Freq: Three times a day (TID) | INTRAVENOUS | Status: DC
  Administered 2018-07-20 (×2): 2.25 g via INTRAVENOUS
  Filled 2018-07-20 (×4): qty 50

## 2018-07-20 MED ORDER — DEXTROSE-NACL 5-0.45 % IV SOLN: INTRAVENOUS | Status: DC

## 2018-07-20 NOTE — Plan of Care (Signed)
  Problem: Nutrition: Goal: Adequate nutrition will be maintained Outcome: Progressing Note:  Tube feedings started last night via cortrak.   Problem: Coping: Goal: Level of anxiety will decrease Outcome: Progressing Note:  No s/s of anxiety noted.   Problem: Clinical Measurements: Goal: Respiratory complications will improve Outcome: Not Progressing Note:  Increasing oxygen requirements to keep O2 sats up.

## 2018-07-20 NOTE — Progress Notes (Signed)
Subjective:  Unable to record urine due to incont-  req in and out cath again this AM- BUN and crt worse as well as sodium- CK stable- lactate is 5 and is having more O2 req  Objective Vital signs in last 24 hours: Vitals:   07/19/18 1959 07/20/18 0032 07/20/18 0516 07/20/18 0908  BP:  (!) 154/80 (!) 135/55 (!) 150/71  Pulse: 60 79 60 72  Resp: (!) 32 (!) 23 (!) 34 (!) 32  Temp:  98.1 F (36.7 C) 98.9 F (37.2 C) 97.9 F (36.6 C)  TempSrc:  Oral  Axillary  SpO2: 97% 97%  99%  Weight:       Weight change:   Intake/Output Summary (Last 24 hours) at 07/20/2018 1120 Last data filed at 07/20/2018 8299 Gross per 24 hour  Intake 414.16 ml  Output 750 ml  Net -335.84 ml    Assessment/Plan: 84 year old black male with advanced CKD at baseline.  He has suffered some acute on chronic injury in the setting of being admitted with hypotension/UTI/rhabdo/volume depletion 1.Renal-creatinine appearing to worsen over the last several days,  from 3.9--> 4.4, now 5.  BUN higher as well.  Per the family he does have good urine output even though it has not been recorded.  GFR is currently calculated at 11.  I do not think that this would lead to his immediate demise but likely will hamper her recovery from everything else.  Patient is not a candidate for dialysis, wife and daughters appear to agree.  Hopefully, kidney function would plateau and recover to his previous poor level however is nto doing that.  However, there is nothing else to actively do to make that happen.  I think need to consider that patietn will not survive this hospitalization- consider palliative care input.  As I am not doing anything renal will sign off  2. Hypertension/volume  -appears euvolemic.  Previously with hypernatremia but that is corrected.  Given that he has an elevated CK I have no issue with continued IV fluid administration- CK  went down to 2600- up  today - maybe from inactivity - 3.  Elevated CK/rhabdo-due to recent  events.  Does seem to have improved but higher yesterday, lower today 4. Anemia  -not a major issue at this time  Difficult situation as family does not seem to be aware of how ill he is and that it is entirely possible he will pass away this hospitalization- as I have nothing to add, will sign off    [place]: Basic Metabolic Panel: Recent Labs  Lab 07/18/18 0605 07/19/18 0341 07/20/18 0409  NA 142 139 147*  K 3.5 4.0 4.1  CL 116* 115* 117*  CO2 15* 14* 13*  GLUCOSE 165* 325* 213*  BUN 69* 69* 75*  CREATININE 4.38* 4.38* 5.05*  CALCIUM 8.2* 8.1* 8.6*  PHOS  --  5.3* 6.3*   Liver Function Tests: Recent Labs  Lab 07/25/2018 1755 07/19/18 0341 07/20/18 0409  AST 50*  --   --   ALT 20  --   --   ALKPHOS 68  --   --   BILITOT 1.0  --   --   PROT 7.3  --   --   ALBUMIN 3.3* 2.0* 2.0*   No results for input(s): LIPASE, AMYLASE in the last 168 hours. No results for input(s): AMMONIA in the last 168 hours. CBC: Recent Labs  Lab 07/31/2018 1755  07/15/18 2352 07/16/18 1813  07/17/18 0643 07/18/18 0605 07/20/18 0438  WBC 8.5   < > 9.4 9.7 10.1 8.8 13.4*  NEUTROABS 7.6  --  7.8* 8.0*  --   --   --   HGB 11.9*   < > 10.5* 10.1* 10.3* 9.6* 10.2*  HCT 38.9*   < > 33.5* 31.9* 31.2* 30.4* 31.3*  MCV 96.3   < > 93.8 93.8 91.0 90.7 91.8  PLT 190   < > 138* PLATELET CLUMPS NOTED ON SMEAR, COUNT APPEARS DECREASED 122* 129* 138*   < > = values in this interval not displayed.   Cardiac Enzymes: Recent Labs  Lab 07/17/18 0157 07/17/18 1819 07/18/18 0605 07/19/18 0341 07/20/18 0438  CKTOTAL 7,195* 6,937* 2,616* 4,095* 3,145*   CBG: Recent Labs  Lab 07/19/18 1714 07/19/18 2001 07/20/18 0027 07/20/18 0512 07/20/18 0806  GLUCAP 87 188* 227* 127* 164*    Iron Studies: No results for input(s): IRON, TIBC, TRANSFERRIN, FERRITIN in the last 72 hours. Studies/Results: Ct Head Wo Contrast  Result Date: 07/18/2018 CLINICAL DATA:  84 year old with  acute mental status changes. Diminished activity involving the LEFT LOWER extremity and rightward gaze. EXAM: CT HEAD WITHOUT CONTRAST TECHNIQUE: Contiguous axial images were obtained from the base of the skull through the vertex without intravenous contrast. COMPARISON:  07/16/2018, 02/27/2018 and earlier. FINDINGS: Patient motion blurred many of the images though the study does appear diagnostic. Brain: Severe age related cortical, deep and cerebellar atrophy. Severe changes of small vessel disease of the white matter diffusely, including the brainstem and pons. No mass lesion. No midline shift. No acute hemorrhage or hematoma. No extra-axial fluid collections. No evidence of acute infarction. Vascular: Severe BILATERAL carotid siphon and vertebrobasilar atherosclerosis. Atherosclerosis involving the RIGHT M1 segment. No hyperdense vessel. Skull: No skull fracture or other focal osseous abnormality involving the skull. Sinuses/Orbits: Visualized paranasal sinuses, bilateral mastoid air cells and bilateral middle ear cavities well-aerated. Visualized orbits and globes normal in appearance. Other: None. IMPRESSION: 1. Motion degraded examination demonstrates no acute intracranial abnormality. 2. Severe age related generalized atrophy and severe chronic microvascular ischemic changes of the white matter. In the that is 2 Electronically Signed   By: Evangeline Dakin M.D.   On: 07/18/2018 14:45   Dg Chest Port 1 View  Result Date: 07/20/2018 CLINICAL DATA:  Leukocytosis EXAM: PORTABLE CHEST 1 VIEW COMPARISON:  07/17/2018 FINDINGS: Cardiomegaly, prior CABG. Left pacer remains in place, unchanged. Layering bilateral effusions. Mild vascular congestion. Left base atelectasis or infiltrate. IMPRESSION: Cardiomegaly with vascular congestion. Small bilateral effusions. Left lower lobe atelectasis or infiltrate. No real change. Electronically Signed   By: Rolm Baptise M.D.   On: 07/20/2018 07:30   Dg Abd Portable  1v  Result Date: 07/19/2018 CLINICAL DATA:  Encounter for feeding tube placement. EXAM: PORTABLE ABDOMEN - 1 VIEW COMPARISON:  Abdominal CT 10/19/2017 FINDINGS: Feeding tube extends in the abdomen and the tip is in the stomach body region. Patient has a TrapEase IVC filter. Nonobstructive bowel gas pattern. Cardiac silhouette appears to be enlarged with pacemaker leads. Limited evaluation of the lung bases. Aortic atherosclerosis. IMPRESSION: Feeding tube in the stomach body region. Electronically Signed   By: Markus Daft M.D.   On: 07/19/2018 17:50   Ct Maxillofacial Wo Contrast  Result Date: 07/18/2018 CLINICAL DATA:  Sinus drainage with fever, question sinus infection. EXAM: CT MAXILLOFACIAL WITHOUT CONTRAST TECHNIQUE: Multidetector CT imaging of the maxillofacial structures was performed. Multiplanar CT image reconstructions were also generated. COMPARISON:  None. FINDINGS: Osseous: No  acute fracture bone destruction. Orbits: Intact orbits and globes with bilateral lens replacements. No retrobulbar abnormality. No acute inflammatory process. Sinuses: Mild ethmoid and mild-to-moderate sphenoid sinus mucosal thickening. The frontal sinuses and mastoids appear clear. Soft tissues: No significant soft tissue swelling. Limited intracranial: Atrophy with chronic small vessel ischemia. IMPRESSION: 1. Mild ethmoid and mild to moderate sphenoid sinus mucosal thickening. No air-fluid levels to suggest acute sinusitis. 2. No acute maxillofacial fracture. Electronically Signed   By: Ashley Royalty M.D.   On: 07/18/2018 15:17   Medications: Infusions: . sodium chloride 250 mL (07/18/18 1225)  . dextrose 5 % and 0.45% NaCl    . feeding supplement (GLUCERNA 1.2 CAL) 50 mL/hr at 07/20/18 0914  . piperacillin-tazobactam (ZOSYN)  IV    . valproate sodium      Scheduled Medications: . fluticasone  2 spray Each Nare Daily  . insulin aspart  0-9 Units Subcutaneous Q4H  . insulin glargine  7 Units Subcutaneous Daily   . isosorbide-hydrALAZINE  1 tablet Oral Daily  . sodium chloride flush  3 mL Intravenous Q12H    have reviewed scheduled and prn medications.  Physical Exam: General: pt unresponsive- some jerking Heart: RRR Lungs: mostly clear Abdomen:  thin Extremities: no appreciable edema    07/20/2018,11:20 AM  LOS: 6 days

## 2018-07-20 NOTE — Care Management Important Message (Signed)
Important Message  Patient Details  Name: CAELEB BATALLA MRN: [id number] Date of Birth: Aug 11, 1922   Medicare Important Message Given:  Yes    Jozee Hammer P [place] 07/20/2018, 11:35 AM

## 2018-07-20 NOTE — Progress Notes (Signed)
CRITICAL VALUE ALERT  Critical Value:  LACTIC ACID= 5.0  Date & Time Notied:  07/20/2018 AT 0905  Provider Notified: DR [name]  Orders Received/Actions taken: NO NEW ORDERS RECEIVED.

## 2018-07-20 NOTE — Progress Notes (Signed)
Physical Therapy Treatment Patient Details Name: [name] MRN: [id number] DOB: [birth year] Today's Date: 07/20/2018    History of Present Illness Pt adm with AMS and hypernatremia. Pt found to have UTI. Pt developed possible neuroleptic malignant syndrome.  PMHx: dementia, HF, diverticulosis, CAD, CABG, pacemaker, DM, CKD, prostate CA    PT Comments    Pt admitted with above diagnosis. Pt currently with functional limitations due to balance and endurance deficits. Pt was only able to sit EOB 5 min and was fatigued.  Pt would not stand even with max to total assist of 2 as he wanted to lie back down.  Positioned pt in bed comfortably and pt fast asleep within minutes.  Pt on 35% face mask.  Family very attentive.  Pt will benefit from skilled PT to increase their independence and safety with mobility to allow discharge to the venue listed below.     Follow Up Recommendations  Home health PT;Supervision/Assistance - 24 hour     Equipment Recommendations  Wheelchair (measurements PT)(with removable armrest and legrest)    Recommendations for Other Services       Precautions / Restrictions Precautions Precautions: Fall Restrictions Weight Bearing Restrictions: No    Mobility  Bed Mobility Overal bed mobility: Needs Assistance Bed Mobility: Supine to Sit;Sit to Supine     Supine to sit: +2 for physical assistance;Max assist Sit to supine: +2 for physical assistance;Total assist   General bed mobility comments: Assist for all aspects. Pt did assist elevating his trunk into sitting  Transfers Overall transfer level: Needs assistance Equipment used: Rolling walker (2 wheeled) Transfers: Sit to/from Stand Sit to Stand: +2 physical assistance;Total assist;From elevated surface         General transfer comment: Attempted as pads needed to be changed however pt unable to stand with total assist +2.  Family wanted pt to try and pull up on walker and it seemed pt was  getting upset because he just wanted to lie down.   Ambulation/Gait             General Gait Details: unable due to weakness   Stairs             Wheelchair Mobility    Modified Rankin (Stroke Patients Only)       Balance Overall balance assessment: Needs assistance Sitting-balance support: Bilateral upper extremity supported;Feet supported Sitting balance-Leahy Scale: Poor Sitting balance - Comments: mod to max assist sitting EOB x 5 minutes. Had to cue pt to hold head up. Flexed posture and difficulty sitting up  Postural control: Posterior lean;Other (comment)(anterior)                                  Cognition Arousal/Alertness: Awake/alert Behavior During Therapy: Flat affect Overall Cognitive Status: Impaired/Different from baseline Area of Impairment: Following commands                       Following Commands: Follows one step commands inconsistently;Follows one step commands with increased time       General Comments: Pt non verbal and currently not following any commands      Exercises      General Comments General comments (skin integrity, edema, etc.): 35% face mask with O2 in high 80's after working with PT however once settled, back up above 90%.       Pertinent Vitals/Pain Pain Assessment: Faces Faces Pain Scale:  Hurts little more Pain Location: Briefly lt hip with initial return to extension but no further indication with subsequent ROM Pain Descriptors / Indicators: Grimacing Pain Intervention(s): Limited activity within patient's tolerance;Monitored during session;Repositioned    Home Living                      Prior Function            PT Goals (current goals can now be found in the care plan section) Acute Rehab PT Goals Patient Stated Goal: pt unable to state Progress towards PT goals: Not progressing toward goals - comment(pt appears weaker today)    Frequency    Min 3X/week       PT Plan Current plan remains appropriate    Co-evaluation              AM-PAC PT "6 Clicks" Mobility   Outcome Measure  Help needed turning from your back to your side while in a flat bed without using bedrails?: Total Help needed moving from lying on your back to sitting on the side of a flat bed without using bedrails?: Total Help needed moving to and from a bed to a chair (including a wheelchair)?: Total Help needed standing up from a chair using your arms (e.g., wheelchair or bedside chair)?: Total Help needed to walk in hospital room?: Total Help needed climbing 3-5 steps with a railing? : Total 6 Click Score: 6    End of Session Equipment Utilized During Treatment: Gait belt;Oxygen(35 % face mask ) Activity Tolerance: Patient limited by fatigue Patient left: in bed;with call bell/phone within reach;with nursing/sitter in room;with family/visitor present;with bed alarm set Nurse Communication: Mobility status;Need for lift equipment PT Visit Diagnosis: Other abnormalities of gait and mobility (R26.89);Muscle weakness (generalized) (M62.81)     Time: 1027-2536 PT Time Calculation (min) (ACUTE ONLY): 20 min  Charges:  $Therapeutic Activity: 8-22 mins                     [place] Pager:  [phone]  Office:  [place] 07/20/2018, 1:55 PM

## 2018-07-20 NOTE — Progress Notes (Signed)
RT called to patient's room by MD due to patient's family having concerns about patient's pulse ox and oxygen saturation.  Came to patient's room and patient's daughter began to explain to RT that pulse ox was not having a consistent reading, that one minute it would read 100% and the next would read in the 80s.  This RT attempted changing pulse ox from left earlobe to forehead, heating patient's hand to place on patient's finger, and switching to right earlobe.  Patient's oxygen saturations still did not read consistently.  Patient's daughter requesting an ABG in order learn patient's oxygen saturation.  MD paged and informed of current events.  Will continue to monitor.

## 2018-07-20 NOTE — Progress Notes (Signed)
Bladder scan shows 310 mL in bladder.  Order received for in and out cath.  350 mL dark yellow urine obtained from I/O cath.  Pt tolerated well.  Pt desatting overnight.  4L/[place] ineffective in keeping sats up.  55% venturi mask placed, as it appeared that the pt was mouth breathing.  Sats 85-100% on 55% venturi mask.  No s/s of respiratory stress noted.  Pt lying comfortably in bed with no acute distress.  MD notified of increased O2 requirements.  CXR ordered and obtained.  Report given to Nona Dell, RN.  Jodell Cipro

## 2018-07-20 NOTE — Progress Notes (Addendum)
Date: 07/20/2018  Patient name: [name]  Medical record number: [id number]  Date of birth: 1923/03/29   I have seen and evaluated this patient and I have discussed the plan of care with the house staff. Please see Dr. Cristal Generous note for complete details. I concur with their findings.   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  He is looking to the left and tracking me due to voice.  He is answering questions and asked me why he had mittens on.  He has some pain with moving around in bed.  He did work with PT today which is also an improvement.  He did have worsening lab work today which has me concerned he has developed an aspiration pneumonia.  We are changing his Abx today to broaden coverage.  Initial UA on admission showed multiple species due to non clean catch.  We have repeated a UA which is similar and a UC is pending.  BC have not shown any growth to date.  We had a discussion with family this morning about his oxygen status.  He has not been able to have an adequate pleth to tell his O2 saturation.  He apparently had a desaturation event overnight, but this was not documented in his chart and was reported by family.  He was placed on venti mask at family (daughters) request.  I monitored his pleth during our discussion today, and during the best pleth readings (good wave form, consistent with his pulse) he had sats ranging in the high 90s to 100.  He was also conversant, aware and improved from yesterday.  All of this reassures me that he is not hypoxic.  We had our respiratory therapist come and attempt to get a more consistently accurate pulse ox reading, however, they were not able to do get a consistently good response. The family has a personal pulse ox which is also not working (possibly poor vascular flow to digits at this time given infection.)  Despite improved clinical status, family requests an ABG. I explained that this is a painful procedure and they are aware of the risks of pain,  and possibly not getting an adequate sample. I am interested to see his pH given the concern for AGMA, so I have ordered an ABG at this time.  Overall, clinically he has seemed the best I have seen him today. I think he is slowly improving his MS after his Hypernatremia and acute dystonic reaction from geodon.  He has had urinary retention due to benadryl and cogentin given to treat the dystonia.  This is slowly improving.  We will do bladder scans today.  Will have the night team go to evaluate patient and write a note at any time they are called for concerns from nursing overnight.  Further evaluation tomorrow in the AM.   Overall plan - -  For hypoxic event, new elevated WBC and AGMA - change Abx to Zosyn, oxygen to keep sats up, ABG to evaluate O2 level, CPAP as needed when asleep  For renal failure - renal ultrasound, IVF  For urinary retention - bladder scans and I/O caths, d/c offending medications  For weakness/fraily - Tube feeds to maintain nutrition, PT evaluation  For DVT PPx - SCDs, discussed with family and would prefer to hold off on SQ heparin given recent bleeding event during last hospitalization.   For AMS - improving, with better response to voice and questions.    For loose stools - Cdiff is negative.  Anticipate  looser stools with Abx and TF.   For possible agitation - one dose of BZD for overnight if needed, monitor respiratory status if given.  Valproic acid PRN  [name], MD 07/20/2018, 8:24 PM

## 2018-07-20 NOTE — Progress Notes (Signed)
Pharmacy Antibiotic Note  [name] is a 84 y.o. male admitted on 07/28/2018 with AMS/urinary incontinence.  Pt started on Ceftriaxone (Day #6) for UTI, probable PNA. Lactate up to 5 today (1.91 on admission) and WBC up to 13.4 so broadening antibiotics to Zosyn.  SCr up to 5.05 (baseline 3.9), est CrCl < 10 ml/min. Pt not candidate for HD. Pt still making urine 350 ml out since 0700.  Plan: Zosyn 2.25gm IV q8h Will f/u micro data, pt's clinical condition, and renal function  Weight: 134 lb 7.7 oz (61 kg)  Temp (24hrs), Avg:98.1 F (36.7 C), Min:97.4 F (36.3 C), Max:98.9 F (37.2 C)  Recent Labs  Lab 07/16/2018 1821 07/15/2018 2103  07/15/18 2352  07/16/18 1813 07/17/18 0157 07/17/18 0643 07/17/18 0648 07/18/18 0605 07/19/18 0341 07/20/18 0409 07/20/18 0438 07/20/18 0758  WBC  --   --    < > 9.4  --  9.7  --  10.1  --  8.8  --   --  13.4*  --   CREATININE  --   --    < >  --    < > 3.97* 4.11*  --  4.07* 4.38* 4.38* 5.05*  --   --   LATICACIDVEN 3.11* 1.91*  --   --   --   --   --   --   --   --   --   --   --  5.0*   < > = values in this interval not displayed.    Estimated Creatinine Clearance: 8.1 mL/min (A) (by C-G formula based on SCr of 5.05 mg/dL (H)).    Allergies  Allergen Reactions  . Iodides Anaphylaxis and Other (See Comments)  . Iodinated Diagnostic Agents Anaphylaxis  . Iohexol Anaphylaxis     Desc: RN states pt stated he had anyphalactic shock reaction to contrast approx 10 years ago.   . Other Shortness Of Breath  . Ace Inhibitors Other (See Comments)    cough  . Simvastatin Other (See Comments)    Muscle aches  . Aspirin Other (See Comments)     Abdominal Pain    Antimicrobials this admission: Cefepime 12/13 x1 Ceftriaxone 12/14>>12/19 Zosyn 12/19>>  Microbiology results: 12/13 BCx: negative 12/13 UCx: 100kcol multiple species 12/17 Trach asp: 12/17 Cdiff PCR: negative 12/18 [id number]   Thank you for allowing pharmacy to be  a part of this patient's care.  Sherlon Handing, PharmD, BCPS Clinical pharmacist  **Pharmacist phone directory can now be found on [email or web address] ([email or web address]).  Listed under [place]. 07/20/2018 10:25 AM

## 2018-07-20 NOTE — Progress Notes (Addendum)
Paged by RN about hyperglycemia and unreadable O2 sats at 20:52.   Reviewed patient's chart. CBG 48 at 1945. Per RN, difficult to tell if patient was symptomatic due to his severe dementia, but he did appear somewhat agitated. He was given 1 amp of D50 with improvement in BG to 119--> 176. IV fluids were changed from 1/2NS to D5 1/2NS @ 75cc/hr. RN aware of order change.   When we returned the page at 2115 RN reported he continued to be agitated. He has IV valproic acid ordered TID PRN for agitation. Advised to give one dose. If patient remains agitated, will consider a small dose of Ativan.   Regarding unreadable O2 sats, this appears to be an ongoing issue that started earlier this AM. Multiple attempts at obtaining adequate O2 sats have failed throughout the day though he has not been in any respiratory distress during this time. Family requested ABG this PM to check O2 sats. This has been ordered and was collected 20 minutes ago. Will follow up and update family.   2200 - ABG 7.36/20.5/120/11.5. Updated family at bedside. They inquired about the low CO2. Explained to family this could be respiratory compensation from chronic metabolic acidosis due to CKD + ongoing worsening of renal function as well as pain-associated hyperventilation during collection of ABG. Discussed this is likely chronic in nature as his pH is within normal range. They verbalized understanding. Patient's daughter proceeded to report patient has been agitated though does not specify how. We recommended a dose of IV valproic acid that he has available TID PRN for agitation, but daughter declined stating the last time he received this medication 3 days ago his rigidity worsened. We asked RN to administer a small dose of IV Ativan as ordered per day team. Will continue to follow.   Welford Roche, MD  Internal Medicine PGY-2  P [phone]

## 2018-07-20 NOTE — Progress Notes (Signed)
Nutrition Follow-up  DOCUMENTATION CODES:   Severe malnutrition in context of chronic illness  INTERVENTION:    Continue Glucerna 1.2 at 60 ml/h to meet 100% of estimated needs.  NUTRITION DIAGNOSIS:   Severe Malnutrition related to chronic illness(dementia, CHF, CKD) as evidenced by severe fat depletion, severe muscle depletion.  Ongoing   GOAL:   Patient will meet greater than or equal to 90% of their needs  Met with TF  MONITOR:   TF tolerance, PO intake  ASSESSMENT:   84 year-old man with PMHx CAD, CKD stage IV, dementia, Type 2 DM, HTN, CHF, tachybrady syndrome s/p pacemaker. He presented to the ED for AMS and urinary incontinence for the past week. Patient found to have AKI and dx with sepsis.   Cortrak feeding tube placed yesterday. Tip is in the stomach. Patient is tolerating TF well at goal rate to meet nutrition needs.  Currently NPO. Labs and medications reviewed.  Noted poor prognosis.    Diet Order:   Diet Order            Diet NPO time specified  Diet effective now              EDUCATION NEEDS:   Not appropriate for education at this time  Skin:  Skin Assessment: Reviewed RN Assessment  Last BM:  12/19  Height:   Ht Readings from Last 1 Encounters:  05/21/18 _0  (1.803 m)    Weight:   Wt Readings from Last 1 Encounters:  07/19/18 61 kg    Ideal Body Weight:  78.18 kg  BMI:  Body mass index is 18.76 kg/m.  Estimated Nutritional Needs:   Kcal:  1700-1900  Protein:  85-95 gm  Fluid:  >/= 1.6 L/day    Molli Barrows, RD, LDN, [place] Pager [phone] After Hours Pager [phone]

## 2018-07-20 NOTE — Progress Notes (Signed)
   Subjective: Intermittent hypoxia overnight but overall mental status is much better than previously. Discussed plan to add another antibiotic today   Objective:  Vital signs in last 24 hours: Vitals:   07/19/18 1959 07/20/18 0032 07/20/18 0516 07/20/18 0908  BP:  (!) 154/80 (!) 135/55 (!) 150/71  Pulse: 60 79 60 72  Resp: (!) 32 (!) 23 (!) 34 (!) 32  Temp:  98.1 F (36.7 C) 98.9 F (37.2 C) 97.9 F (36.6 C)  TempSrc:  Oral  Axillary  SpO2: 97% 97%  99%  Weight:       Gen: laying in bed, HOB elevated, alert and looking around the room, responds to some questions Pulm: faint bibasilar crackles, otherwise CTAB, good air movement throughout Abd: soft, ND, NT, no suprapubic tenderness Ext: SCDs in place, no LEE Skin: large superficial blister on left lateral thigh covered with dressing, small non-raised erythematous region on left lateral hip. Right forearm with scattered superficial blistering in various stages of healing, covered with dressing, clean and dry     Assessment/Plan:  Active Problems:   Dementia (HCC)   Benign essential HTN   Chronic renal insufficiency, stage 4 (severe) (HCC)   Diabetes mellitus type 2, insulin dependent (HCC)   Chronic combined systolic (congestive) and diastolic (congestive) heart failure (HCC)   Acute renal failure superimposed on chronic kidney disease ([place])   Sinus bradycardia-tachycardia syndrome (HCC)   Sepsis ([place])   Drug induced acute dystonia   Protein-calorie malnutrition, severe  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 On presentation is hypothermic and tachypneic. Found to have U/A positive for leuks and bacteria. Elevated lactic acid at 3. BMP revealed worsening hypernatremia to 159, as well AKI on CKD.    Metabolic encephalopathy: Mental status continues to improve. Na remains  WNLs. CK down trending. New leukocytosis and worsening left lower lobe infiltrate on repeat CXR this morning, elevated lactate of 5.0, and metabolic gap acidosis concerning for inadequately treated infection, likely pulmonary source but UA from yesterday's I&O cath also looks infected.  - Start Zosyn 12/19 - discontinue ceftriaxone  - f/u urine culture  - trend lactate  - repeat CBC in the morning   AKI on CKD: Baseline creatine 3.0. Creatine worse today - IVFs: 1/2 NS 75cc/hr continuous   - renal ultrasound - scheduled bladder scans TID with I&O cath if >250cc  - CK trending down  - avoid anticholinergic meds  HFrEF: euvolemic on exam - hold lasix - continue to monitor volume status    Chronic Issues Stable:   T2DM:  - lantus 7 units daily - sensitive SSI novolog   Advanced dementia with acute delirium: home meds include geodon, seroquel, depakote - holding geodon and seroquel due to suspected NMS - okay to give depakote - if agitation becomes problematic, can give low dose lorazepam.  - avoid anticholinergic meds  HTN: hypertensive today. Holding home atenolol and amlodipine and imdur  - not able to take po meds - start IV hydralazine 5mg  TID prn SBP > 150   Diet: glucerna via NG tube  IVFs: 1/2 NS at 75cc/hr DVT ppx: SCDs    Dispo: Anticipated discharge in approximately 3 day(s).   Isabelle Course, MD 07/20/2018, 1:27 PM Pager: [phone]

## 2018-07-21 ENCOUNTER — Other Ambulatory Visit (HOSPITAL_COMMUNITY): Payer: Medicare Other

## 2018-07-21 ENCOUNTER — Inpatient Hospital Stay (HOSPITAL_COMMUNITY): Payer: Medicare Other

## 2018-07-21 DIAGNOSIS — I213 ST elevation (STEMI) myocardial infarction of unspecified site: Secondary | ICD-10-CM

## 2018-07-21 DIAGNOSIS — R7989 Other specified abnormal findings of blood chemistry: Secondary | ICD-10-CM

## 2018-07-21 LAB — BASIC METABOLIC PANEL
Anion gap: 24 — ABNORMAL HIGH (ref 5–15)
BUN: 90 mg/dL — ABNORMAL HIGH (ref 8–23)
CO2: 8 mmol/L — ABNORMAL LOW (ref 22–32)
Calcium: 9 mg/dL (ref 8.9–10.3)
Chloride: 114 mmol/L — ABNORMAL HIGH (ref 98–111)
Creatinine, Ser: 6.4 mg/dL — ABNORMAL HIGH (ref 0.61–1.24)
GFR calc Af Amer: 8 mL/min — ABNORMAL LOW (ref >60)
GFR calc non Af Amer: 7 mL/min — ABNORMAL LOW (ref >60)
Glucose, Bld: 355 mg/dL — ABNORMAL HIGH (ref 70–99)
POTASSIUM: 5.2 mmol/L — AB (ref 3.5–5.1)
SODIUM: 146 mmol/L — AB (ref 135–145)

## 2018-07-21 LAB — CBC WITH DIFFERENTIAL/PLATELET
Abs Immature Granulocytes: 0.25 10*3/uL — ABNORMAL HIGH (ref 0.00–0.07)
BASOS PCT: 0 %
Basophils Absolute: 0 10*3/uL (ref 0.0–0.1)
Eosinophils Absolute: 0 10*3/uL (ref 0.0–0.5)
Eosinophils Relative: 0 %
HCT: 30.2 % — ABNORMAL LOW (ref 39.0–52.0)
Hemoglobin: 9.4 g/dL — ABNORMAL LOW (ref 13.0–17.0)
Immature Granulocytes: 2 %
Lymphocytes Relative: 3 %
Lymphs Abs: 0.5 10*3/uL — ABNORMAL LOW (ref 0.7–4.0)
MCH: 29.3 pg (ref 26.0–34.0)
MCHC: 31.1 g/dL (ref 30.0–36.0)
MCV: 94.1 fL (ref 80.0–100.0)
Monocytes Absolute: 0.6 10*3/uL (ref 0.1–1.0)
Monocytes Relative: 4 %
Neutro Abs: 13.8 10*3/uL — ABNORMAL HIGH (ref 1.7–7.7)
Neutrophils Relative %: 91 %
PLATELETS: 187 10*3/uL (ref 150–400)
RBC: 3.21 MIL/uL — AB (ref 4.22–5.81)
RDW: 14.2 % (ref 11.5–15.5)
WBC: 15.2 10*3/uL — AB (ref 4.0–10.5)
nRBC: 0.4 % — ABNORMAL HIGH (ref 0.0–0.2)

## 2018-07-21 LAB — LEGIONELLA PNEUMOPHILA SEROGP 1 UR AG: L. pneumophila Serogp 1 Ur Ag: NEGATIVE

## 2018-07-21 LAB — GLUCOSE, CAPILLARY: Glucose-Capillary: 200 mg/dL — ABNORMAL HIGH (ref 70–99)

## 2018-07-21 LAB — TROPONIN I: Troponin I: >65 ng/mL (ref <0.03)

## 2018-07-21 LAB — CK: Total CK: 2764 U/L — ABNORMAL HIGH (ref 49–397)

## 2018-07-21 LAB — MAGNESIUM: MAGNESIUM: 2.3 mg/dL (ref 1.7–2.4)

## 2018-07-21 LAB — LACTIC ACID, PLASMA: LACTIC ACID, VENOUS: 10.6 mmol/L — AB (ref 0.5–1.9)

## 2018-07-21 MED ORDER — CLOPIDOGREL BISULFATE 75 MG PO TABS
600.0000 mg | ORAL_TABLET | Freq: Once | ORAL | Status: AC
  Administered 2018-07-21: 600 mg via ORAL
  Filled 2018-07-21: qty 8

## 2018-07-21 MED ORDER — ASPIRIN 81 MG PO CHEW
324.0000 mg | CHEWABLE_TABLET | Freq: Once | ORAL | Status: AC
  Administered 2018-07-21: 324 mg
  Filled 2018-07-21: qty 4

## 2018-07-21 MED ORDER — HEPARIN BOLUS VIA INFUSION
3000.0000 [IU] | Freq: Once | INTRAVENOUS | Status: AC
  Administered 2018-07-21: 3000 [IU] via INTRAVENOUS
  Filled 2018-07-21: qty 3000

## 2018-07-21 MED ORDER — CLOPIDOGREL BISULFATE 75 MG PO TABS: 300.0000 mg | ORAL_TABLET | Freq: Once | ORAL | Status: DC

## 2018-07-21 MED ORDER — CLOPIDOGREL BISULFATE 75 MG PO TABS: 75.0000 mg | ORAL_TABLET | Freq: Every day | ORAL | Status: DC

## 2018-07-21 MED ORDER — HYDROMORPHONE HCL 1 MG/ML IJ SOLN
0.5000 mg | Freq: Once | INTRAMUSCULAR | Status: AC
  Administered 2018-07-21: 0.5 mg via INTRAVENOUS

## 2018-07-21 MED ORDER — HYDROMORPHONE HCL 1 MG/ML IJ SOLN
0.1500 mg | Freq: Once | INTRAMUSCULAR | Status: AC
  Administered 2018-07-21: 0.15 mg via INTRAVENOUS
  Filled 2018-07-21: qty 1

## 2018-07-21 MED ORDER — HEPARIN (PORCINE) 25000 UT/250ML-% IV SOLN
700.0000 [IU]/h | INTRAVENOUS | Status: DC
  Administered 2018-07-21: 700 [IU]/h via INTRAVENOUS
  Filled 2018-07-21: qty 250

## 2018-07-21 MED ORDER — HYDROMORPHONE HCL 1 MG/ML IJ SOLN: 0.5000 mg | INTRAMUSCULAR | Status: DC | PRN

## 2018-08-02 NOTE — Progress Notes (Signed)
ANTICOAGULATION CONSULT NOTE - Initial Consult  Pharmacy Consult for Heparin Indication: chest pain/ACS  Allergies  Allergen Reactions  . Iodides Anaphylaxis and Other (See Comments)  . Iodinated Diagnostic Agents Anaphylaxis  . Iohexol Anaphylaxis     Desc: RN states pt stated he had anyphalactic shock reaction to contrast approx 10 years ago.   . Other Shortness Of Breath  . Ace Inhibitors Other (See Comments)    cough  . Simvastatin Other (See Comments)    Muscle aches  . Aspirin Other (See Comments)     Abdominal Pain    Patient Measurements: Weight: 134 lb 7.7 oz (61 kg)  Vital Signs: Temp: 97.9 F (36.6 C) (12/20 0426) Temp Source: Axillary (12/20 0426) BP: 144/58 (12/20 0426) Pulse Rate: 68 (12/20 0426)  Labs: Recent Labs    07/18/18 0605 07/19/18 0341 07/20/18 0409 07/20/18 0438 2018/08/07 0231  HGB 9.6*  --   --  10.2* 9.4*  HCT 30.4*  --   --  31.3* 30.2*  PLT 129*  --   --  138* 187  CREATININE 4.38* 4.38* 5.05*  --  6.40*  CKTOTAL 2,616* 4,095*  --  3,145* 2,764*  TROPONINI  --   --   --   --  >65.00*    Estimated Creatinine Clearance: 6.4 mL/min (A) (by C-G formula based on SCr of 6.4 mg/dL (H)).   Medical History: Past Medical History:  Diagnosis Date  . CAD (coronary artery disease)    a. CABG 1991 and 2006 LIMA LAD, seq. SVG D1-Cfx, SVG d2,and seq. SVG AM-PDA   . Chronic systolic CHF (congestive heart failure) ([place])   . CKD (chronic kidney disease)   . Dementia ([place])   . Depression   . Diabetes mellitus   . Diverticulitis   . GI bleeding 05/11/2018  . HTN (hypertension)   . Left leg DVT ([place])   . Presence of permanent cardiac pacemaker   . Prostate cancer ([place])   . Tachycardia-bradycardia syndrome ([place])     Medications:  Medications Prior to Admission  Medication Sig Dispense Refill Last Dose  . atenolol (TENORMIN) 50 MG tablet Take 50 mg by mouth daily as needed (SBP >150). Crush and mix with applesauce   07/12/2018 at 1000  .  divalproex (DEPAKOTE SPRINKLE) 125 MG capsule Take 125 mg by mouth every evening.   10 07/27/2018 at am  . feeding supplement, GLUCERNA SHAKE, (GLUCERNA SHAKE) LIQD Take 237 mLs by mouth 3 (three) times daily as needed (feeding supplement).   07/13/2018 at Unknown time  . ferrous sulfate 325 (65 FE) MG tablet Take 325 mg by mouth daily with breakfast. Crush and mix with applesauce   week ago  . furosemide (LASIX) 40 MG tablet Take 1 tablet (40 mg total) by mouth daily as needed. 30 tablet 0 2 1/2 weeks ago at Unknown time  . insulin glargine (LANTUS) 100 unit/mL SOPN Inject 8-20 Units into the skin daily as needed (CBG >200 (per sliding scale)).   07/12/2018  . isosorbide-hydrALAZINE (BIDIL) 20-37.5 MG tablet Take 1 tablet by mouth 3 (three) times daily. (Patient taking differently: Take 1 tablet by mouth daily. Crush and mix with applesauce) 90 tablet 1 07/13/2018 at am  . linagliptin (TRADJENTA) 5 MG TABS tablet Take 7.5 mg by mouth daily.   07/13/2018 at am  . polyethylene glycol (MIRALAX / GLYCOLAX) packet Take 8.5-17 g by mouth daily as needed (constipation).    07/13/2018 at Unknown time  . senna (SENOKOT) 8.6 MG  TABS tablet Take 1 tablet by mouth daily. Crush and mix with applesauce   07/13/2018 at Unknown time  . ziprasidone (GEODON) 20 MG capsule Take 20 mg by mouth daily. Sprinkle on applesauce   07/13/2018 at am  . amLODipine (NORVASC) 5 MG tablet Take 1 tablet (5 mg total) by mouth daily. (Patient not taking: Reported on 07/04/2018) 30 tablet 0 Not Taking at Unknown time  . Darbepoetin Alfa (ARANESP) 40 MCG/0.4ML SOSY injection Inject 0.4 mLs (40 mcg total) into the skin once a week. (Patient not taking: Reported on 07/20/2018) 8.4 mL  Not Taking at Unknown time  . docusate sodium (COLACE) 100 MG capsule Take 2 capsules (200 mg total) by mouth 2 (two) times daily. (Patient not taking: Reported on 07/04/2018) 10 capsule 0 Not Taking at Unknown time  . ezetimibe (ZETIA) 10 MG tablet Take 1  tablet (10 mg total) by mouth daily. (Patient not taking: Reported on 05/11/2018) 30 tablet 0 Not Taking at Unknown time  . Glucose Blood (FREESTYLE LITE TEST VI) USE AS DIRECTED   Taking  . pantoprazole (PROTONIX) 40 MG tablet Take 1 tablet (40 mg total) by mouth 2 (two) times daily. (Patient not taking: Reported on 07/20/2018) 60 tablet 0 Not Taking at Unknown time  . QUEtiapine (SEROQUEL) 25 MG tablet Take 0.5 tablets (12.5 mg total) by mouth at bedtime. (Patient not taking: Reported on 07/19/2018) 30 tablet 0 Not Taking at Unknown time  . QUEtiapine (SEROQUEL) 50 MG tablet Take 50 mg by mouth at bedtime. Crush and sprinkle on applesauce  9 07/12/2018 at pm    Assessment: 85 y.o. male with SOB/elevated cardiac markers for heparin Goal of Therapy:  Heparin level 0.3-0.7 units/ml Monitor platelets by anticoagulation protocol: Yes   Plan:  Heparin 3000 units IV bolus, then start heparin 700 units/hr Check heparin level in 8 hours.   Caryl Pina 08/17/2018,4:33 AM

## 2018-08-02 NOTE — Progress Notes (Addendum)
Dr. Myrtie Hawk went to re-evaluate patient at 2310. He appeared tachypneic with RR in the 40-50s and she informed me (Dr. Frederico Hamman). I joined her at patient's bedside.   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 He did appeared intermittently tachypneic with RR 40-50s while being moved in bed. When I asked if he had chest pain, he moved his head L to R indicating no. When I asked if he had leg pain, which he daughter mentioned he did earlier today, he nodded yes. His caretaker and daughter confirmed his response. He did no verbalized any complaints, but appeared overall uncomfortable and was moaning at times. He had recently received IV Ativan for agitation. Order was for 0.5 mg x1, but RN administered 0.125 mg per family request.   Per primary team's note, patient seemed to be improving in the AM. He was answering questions and interacting with the team. He did develop a new leukocytosis and his antibiotics were switched to Zosyn to cover for aspiration PNA. His CXR earlier in the day showed vascular congestion, small bilateral effusions and LLL atelectasis vs infiltrate. Overall no change from prior study on 12/13.   His family was concerned about decreased UOP and requested a fluid challenge with a bolus. This was not performed due to concern for volume overload, he was kept on mIVF with 1/2NS @75cc /hr. Per RN, IVF were never increased to 150cc/hr as per prior orders.   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  His hospital course has been complicated by aspiration pneumonia, urinary retention requiring I&O caths, agitation/?delirium, and severe deconditioning.  He has been intermittently tachypneic during this admission with RR up to the 30s, but this acutely worsened tonight while getting bathe, though still intermittent. Recent ABG from 2100 was 7.36/20.5/120/11.5 (see prior note).  Given that he has been bedbound since admission and has not received any chemical VTE prophylaxis (per family request), PE should be ruled out. GENEVA score is 4 (moderate risk). V/Q scan has been ordered. Will hold off on heparin gtt per family request. Differential diagnosis also includes ACS, heart failure exacerbation, pain-related tachypnea, and worsening metabolic acidosis with bicarb of 13 on most recent BMP.  - V/Q scan, holding off on heparin gtt  - CXR to check for acute pulmonary processes such as edema or worsening infiltrates  - EKG  - Trend troponin  - STOP IV fluids due to concern for pulmonary edema  - Complete echocardiogram to evaluate LV function  - If workup is unremarkable, would consider treating for possible pain as well as initiating bicarb per tube as symptoms may be related to respiratory compensation for worsening metabolic acidosis

## 2018-08-02 NOTE — Progress Notes (Signed)
Dr. Frederico Hamman called me this AM to discuss patient care.  I have reviewed her notes.  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  Per our discussion, he never complained of chest pain or pressure to our team and this was the same in our morning evaluation yesterday.  He has been having intermittent leg cramps.  Based on review of notes, Dr. Frederico Hamman did an appropriate initial work up and got cardiology involved expeditiously.  The day time team will see [name] early and address IV status and further medical management of his new cardiac issues.   Gilles Chiquito, MD

## 2018-08-02 NOTE — Progress Notes (Addendum)
Received paged about critical lab at 0350. Lactic acid 10.5 increased from 5 yesterday and troponin > 65.    Ordered another STAT EKG and went to evaluate patient at bedside emergently.  He appeared comfortable and was sleeping. His family including both daughters and wife are at bedside. I discussed patient is likely having an acute STEMI given markedly elevated troponin and may be experiencing early signs of cardiogenic shock in the setting of markedly elevated lactic acid. I explained he is not a candidate for invasive interventions such as heart catheterization and inotropic therapy given age, multiple medical comorbidities, and acute illness. They verbalized understanding. They declined initiating heparin gtt earlier in the night due to concern for bleeding. Since he is not a candidate for invasive strategies, I explained we can treat this medically with heparin gtt and Plavix as the benefits of starting [facility] outweigh the risks (death) at this time. I also offered IV pain medicine to manage pain patient may be experiencing but not able to verbalize. Family verbalized understanding and agreeable to starting medical management. They also requested a cardiology consult. He follows up with Dr. Daneen Schick from [facility].    Repeat EKG still of poor quality but obvious ST elevation on inferior leads. Cardiology was consulted. Appreciate assistance. They also recommended medical therapy as below.   - ASA 324 mg STAT   - Start heparin gtt  - Plavix load 600 mg followed by Plavix 75 mg QD  - IV Dilaudid 0.5 mg q2h PRN for pain  - Will not resume his IVF at this time given concern for cardiogenic shock in the setting of acute MI  - Follow up complete echocardiogram  - Cardiology will continue to follow, appreciate assistance  - Family updated   Welford Roche, MD  Internal Medicine PGY-2  P [phone]

## 2018-08-02 NOTE — Progress Notes (Signed)
Patient lost IV access and we have not been able to initiate heparin gtt. ASA 324 mg x1 and Plavix dose have been given. Awaiting for IV team.

## 2018-08-02 NOTE — Progress Notes (Signed)
Re-evaluated patient at bedside. Resting comfortably in bed. RR in the 30s. EKG showed V-paced rhythm with few supraventricular complexes and ST depressions in V2 and V3 that appear to be new from prior EKG in 05/2018. However, EKG of poor quality due to patient's restlessness. Family aware of results. We are trending troponin, but family declined initial blood draw. First troponin drawn at 0245. Will follow. CXR showed worsening atelectasis with mild progression of bilateral pleural effusions L>R. I would recommend against IVF at this time, but family concerned that patient's hypernatremia will worsen.  We will continue 1/2 NS @ 75 cc/hr and monitor his respiratory status closely.  No in-house nuclear medicine tech to perform V/Q scan overnight. Paged on-call tech who stated they will be here at [facility] and schedule patient for study then. Given that patient appears much improved at this time and that he has an IVC filter in place (seeon on recent KUB), I am less suspicious of a PE and will await for the study to be performed later this morning. I do think acute PE should be ruled out though. As a correction to my previous note, his GENEVA score is 7 as he has a prior diagnosis of DVT requiring IVC filter placement.  I was not aware this at that time. Family aware of this plan and in agreement.   Bladder scans overnight showed 120cc and 99cc. US renal ordered by primary team was completed overnight and showed multiple bilateral renal cysts that are not new.  Otherwise ultrasound was unremarkable.   Overall.  I suspect patient's tachypnea is related to his worsening metabolic acidosis and he may benefit from initiation of bicarbonate.  Family seems agreeable to this if his work-up is unremarkable.  Will defer this to primary team.  Welford Roche, MD  Internal Medicine PGY-2  P [phone]

## 2018-08-02 NOTE — Progress Notes (Signed)
V/Q scan ordered placed 2 hours ago. Radiology department unable to see order in the system. We are currently trying to reach staff from [facility] and [employer] to solve this problem.

## 2018-08-02 NOTE — Death Summary Note (Signed)
  Name: [name] MRN: [id number] DOB: [birth year] 85 y.o.  Date of Admission: 07/11/2018  5:21 PM Date of Discharge: 08/19/18 Attending Physician: [name], MD  Discharge Diagnosis: Active Problems:   Dementia ([place])   Benign essential HTN   Chronic renal insufficiency, stage 4 (severe) (HCC)   Diabetes mellitus type 2, insulin dependent (HCC)   Chronic combined systolic (congestive) and diastolic (congestive) heart failure (HCC)   Acute renal failure superimposed on chronic kidney disease ([place])   Sinus bradycardia-tachycardia syndrome (HCC)   Sepsis ([place])   Drug induced acute dystonia   Protein-calorie malnutrition, severe   Leukocytosis   STEMI (ST elevation myocardial infarction) ([place])   Cause of death: STEMI, Cardiogenic shock Time of death: 7:00 AM  Disposition and follow-up:   [name] was discharged from [facility] in expired condition.    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 His altered mental status was secondary to hypernatremia, UTI, and medication side effect for recently started Geodon. Antipsychotics were held and he was treated with IV ceftriaxone. He developed acute dystonia likely secondary to Geodon use on top of Seroquel use. CK was elevated to 7,546 and trended down with IVFs. His mental status gradually improved but unfortunately, he suffered an acute STEMI overnight. The night team evaluated and managed his condition appropriately but he passed away early this morning. The night team was in communication with the attending concerning these events. The family declined MD to enter the room and pronounce death.  Signed: Isabelle Course, MD 08-19-2018, 10:16 AM

## 2018-08-02 NOTE — Consult Note (Signed)
Cardiology Consultation Note    Patient ID: [name], MRN: [id number], DOB/AGE: 1922/09/27 85 y.o. Admit date: 07/03/2018   Date of Consult: 22-Jul-2018 Primary Physician: [name], MD Primary Cardiologist: Dr. Tamala Julian   Reason for Consultation: Markedly elevated troponin Requesting MD: Dr. Isac Sarna  HPI: [name] is a 85 y.o. male with a history of ischemic cardiomyopathy (EF 30 to 35%) coronary artery disease status post multivessel CABG, advanced CKD, dementia, who presented to [facility] on 12/13 with altered mental status, significant hyponatremia and presumed urinary tract infection.  He has been treated with IV antibiotics, with waxing and waning mental status over the past several days.  His renal function has progressively declined over the course of this hospital stay.  Last evening the patient was noted to decompensate from a respiratory perspective; he was tachypneic with respiratory rates in the 30s to 40s.  A broad initial work-up was sent, notable for a troponin greater than 65.  His ECG shows a paced rhythm without ST elevations to meet STEMI criteria.  Upon exam, patient is frail, chronically ill-appearing, and tachypneic.  He is surrounded by family at bedside, who do not wish to pursue any sort of invasive procedures.  Past Medical History:  Diagnosis Date  . CAD (coronary artery disease)    a. CABG 1991 and 2006 [place] LAD, seq. SVG D1-Cfx, SVG d2,and seq. SVG AM-PDA   . Chronic systolic CHF (congestive heart failure) ([place])   . CKD (chronic kidney disease)   . Dementia ([place])   . Depression   . Diabetes mellitus   . Diverticulitis   . GI bleeding 05/11/2018  . HTN (hypertension)   . Left leg DVT ([place])   . Presence of permanent cardiac pacemaker   . Prostate cancer ([place])   . Tachycardia-bradycardia syndrome [facility])       Surgical History:  Past Surgical History:  Procedure Laterality Date  . CORONARY ARTERY BYPASS GRAFT    .  ESOPHAGOGASTRODUODENOSCOPY Left 05/17/2018   Procedure: ESOPHAGOGASTRODUODENOSCOPY (EGD);  Surgeon: Ronald Lobo, MD;  Location: [facility] ENDOSCOPY;  Service: Endoscopy;  Laterality: Left;  . GIVENS CAPSULE STUDY N/A 05/21/2018   Procedure: GIVENS CAPSULE STUDY;  Surgeon: Ronnette Juniper, MD;  Location: [facility];  Service: Gastroenterology;  Laterality: N/A;  . PROSTATECTOMY       Home Meds: Prior to Admission medications   Medication Sig Start Date End Date Taking? Authorizing Provider  atenolol (TENORMIN) 50 MG tablet Take 50 mg by mouth daily as needed (SBP >150). Crush and mix with applesauce   Yes [provider]  divalproex (DEPAKOTE SPRINKLE) 125 MG capsule Take 125 mg by mouth every evening.  02/21/18  Yes [provider]  feeding supplement, GLUCERNA SHAKE, (GLUCERNA SHAKE) LIQD Take 237 mLs by mouth 3 (three) times daily as needed (feeding supplement).   Yes [provider]  ferrous sulfate 325 (65 FE) MG tablet Take 325 mg by mouth daily with breakfast. Crush and mix with applesauce   Yes [provider]  furosemide (LASIX) 40 MG tablet Take 1 tablet (40 mg total) by mouth daily as needed. 05/27/18 09/14/18 Yes [name], MD  insulin glargine (LANTUS) 100 unit/mL SOPN Inject 8-20 Units into the skin daily as needed (CBG >200 (per sliding scale)).   Yes [provider]  isosorbide-hydrALAZINE (BIDIL) 20-37.5 MG tablet Take 1 tablet by mouth 3 (three) times daily. Patient taking differently: Take 1 tablet by mouth daily. Crush and mix with applesauce  11/15/15  Yes Rivet, Sindy Guadeloupe, MD  linagliptin (TRADJENTA) 5 MG TABS tablet Take 7.5 mg by mouth daily.   Yes [provider]  polyethylene glycol (MIRALAX / GLYCOLAX) packet Take 8.5-17 g by mouth daily as needed (constipation).    Yes [provider]  senna (SENOKOT) 8.6 MG TABS tablet Take 1 tablet by mouth daily. Crush and mix with applesauce   Yes [provider]    ziprasidone (GEODON) 20 MG capsule Take 20 mg by mouth daily. Sprinkle on applesauce   Yes [provider]  amLODipine (NORVASC) 5 MG tablet Take 1 tablet (5 mg total) by mouth daily. Patient not taking: Reported on 07/22/2018 05/27/18 06/26/18  [name], MD  Darbepoetin Alfa (ARANESP) 40 MCG/0.4ML SOSY injection Inject 0.4 mLs (40 mcg total) into the skin once a week. Patient not taking: Reported on 07/25/2018 06/02/18   [name], MD  docusate sodium (COLACE) 100 MG capsule Take 2 capsules (200 mg total) by mouth 2 (two) times daily. Patient not taking: Reported on 07/13/2018 05/13/18   Dorrell, Andree Elk, MD  ezetimibe (ZETIA) 10 MG tablet Take 1 tablet (10 mg total) by mouth daily. Patient not taking: Reported on 05/11/2018 10/22/17   Florencia Reasons, MD  Glucose Blood (FREESTYLE LITE TEST VI) USE AS DIRECTED 06/02/13   [provider]  pantoprazole (PROTONIX) 40 MG tablet Take 1 tablet (40 mg total) by mouth 2 (two) times daily. Patient not taking: Reported on 07/10/2018 05/27/18 05/27/19  [name], MD  QUEtiapine (SEROQUEL) 25 MG tablet Take 0.5 tablets (12.5 mg total) by mouth at bedtime. Patient not taking: Reported on 07/02/2018 02/28/18   Georgette Shell, MD  QUEtiapine (SEROQUEL) 50 MG tablet Take 50 mg by mouth at bedtime. Crush and sprinkle on applesauce 05/20/18   [provider]    Inpatient Medications:  . aspirin  324 mg Per Tube Once  . clopidogrel  300 mg Oral Once  . [START ON 07/22/2018] clopidogrel  75 mg Oral Daily  . fluticasone  2 spray Each Nare Daily  . heparin  3,000 Units Intravenous Once  . insulin aspart  0-9 Units Subcutaneous Q4H  . insulin glargine  7 Units Subcutaneous Daily  . isosorbide-hydrALAZINE  1 tablet Oral Daily  . sodium chloride flush  3 mL Intravenous Q12H   . sodium chloride 250 mL (07/18/18 1225)  . feeding supplement (GLUCERNA 1.2 CAL) 60 mL/hr at 07/20/18 1355  . heparin 700 Units/hr (08-02-18  0512)  . piperacillin-tazobactam (ZOSYN)  IV 2.25 g (07/20/18 2202)  . valproate sodium      Allergies:  Allergies  Allergen Reactions  . Iodides Anaphylaxis and Other (See Comments)  . Iodinated Diagnostic Agents Anaphylaxis  . Iohexol Anaphylaxis     Desc: RN states pt stated he had anyphalactic shock reaction to contrast approx 10 years ago.   . Other Shortness Of Breath  . Ace Inhibitors Other (See Comments)    cough  . Simvastatin Other (See Comments)    Muscle aches  . Aspirin Other (See Comments)     Abdominal Pain    Social History   Socioeconomic History  . Marital status: Married    Spouse name: Not on file  . Number of children: Not on file  . Years of education: Not on file  . Highest education level: Not on file  Occupational History  . Not on file  Social Needs  . Financial resource strain: Not hard at  all  . Food insecurity:    Worry: Never true    Inability: Never true  . Transportation needs:    Medical: No    Non-medical: No  Tobacco Use  . Smoking status: Never Smoker  . Smokeless tobacco: Never Used  Substance and Sexual Activity  . Alcohol use: No    Alcohol/week: 0.0 standard drinks  . Drug use: No  . Sexual activity: Not Currently  Lifestyle  . Physical activity:    Days per week: 0 days    Minutes per session: 0 min  . Stress: Not at all  Relationships  . Social connections:    Talks on phone: Never    Gets together: Never    Attends religious service: Never    Active member of club or organization: No    Attends meetings of clubs or organizations: Never    Relationship status: Married  . Intimate partner violence:    Fear of current or ex partner: No    Emotionally abused: No    Physically abused: No    Forced sexual activity: No  Other Topics Concern  . Not on file  Social History Narrative  . Not on file     Family History  Problem Relation Age of Onset  . Diabetes Mother   . Stroke Mother   . Heart disease Sister    . Heart disease Sister   . Heart disease Sister   . Heart disease Sister      Review of Systems: All other systems reviewed and are otherwise negative except as noted above.  Labs: Recent Labs    07/18/18 0605 07/19/18 0341 07/20/18 0438 07-25-2018 0231  CKTOTAL 2,616* 4,095* 3,145* 2,764*  TROPONINI  --   --   --  >65.00*   Lab Results  Component Value Date   WBC 15.2 (H) July 25, 2018   HGB 9.4 (L) 25-Jul-2018   HCT 30.2 (L) 2018/07/25   MCV 94.1 Jul 25, 2018   PLT 187 07-25-2018    Recent Labs  Lab 07/20/2018 1755  07/25/18 0231  NA 159*   < > 146*  K 4.6   < > 5.2*  CL 124*   < > 114*  CO2 21*   < > 8*  BUN 81*   < > 90*  CREATININE 4.05*   < > 6.40*  CALCIUM 10.1   < > 9.0  PROT 7.3  --   --   BILITOT 1.0  --   --   ALKPHOS 68  --   --   ALT 20  --   --   AST 50*  --   --   GLUCOSE 288*   < > 355*   < > = values in this interval not displayed.   Lab Results  Component Value Date   CHOL 163 02/27/2018   HDL 36 (L) 02/27/2018   LDLCALC 104 (H) 02/27/2018   TRIG 114 02/27/2018   Lab Results  Component Value Date   DDIMER 1.56 (H) 09/16/2016    Radiology/Studies:  Ct Head Wo Contrast  Result Date: 07/18/2018 CLINICAL DATA:  85 year old with acute mental status changes. Diminished activity involving the LEFT LOWER extremity and rightward gaze. EXAM: CT HEAD WITHOUT CONTRAST TECHNIQUE: Contiguous axial images were obtained from the base of the skull through the vertex without intravenous contrast. COMPARISON:  07/16/2018, 02/27/2018 and earlier. FINDINGS: Patient motion blurred many of the images though the study does appear diagnostic. Brain: Severe age related cortical, deep and cerebellar  atrophy. Severe changes of small vessel disease of the white matter diffusely, including the brainstem and pons. No mass lesion. No midline shift. No acute hemorrhage or hematoma. No extra-axial fluid collections. No evidence of acute infarction. Vascular: Severe BILATERAL  carotid siphon and vertebrobasilar atherosclerosis. Atherosclerosis involving the RIGHT M1 segment. No hyperdense vessel. Skull: No skull fracture or other focal osseous abnormality involving the skull. Sinuses/Orbits: Visualized paranasal sinuses, bilateral mastoid air cells and bilateral middle ear cavities well-aerated. Visualized orbits and globes normal in appearance. Other: None. IMPRESSION: 1. Motion degraded examination demonstrates no acute intracranial abnormality. 2. Severe age related generalized atrophy and severe chronic microvascular ischemic changes of the white matter. In the that is 2 Electronically Signed   By: Evangeline Dakin M.D.   On: 07/18/2018 14:45   Ct Head Wo Contrast  Result Date: 07/16/2018 CLINICAL DATA:  Muscle spasm. Possible drug reaction. Altered mental status. EXAM: CT HEAD WITHOUT CONTRAST TECHNIQUE: Contiguous axial images were obtained from the base of the skull through the vertex without intravenous contrast. COMPARISON:  Head CT 02/27/2018 FINDINGS: Brain: There is no mass, hemorrhage or extra-axial collection. There is generalized atrophy without lobar predilection. Areas of hypoattenuation of the deep gray nuclei and confluent periventricular white matter hypodensity, consistent with chronic small vessel disease. Vascular: Atherosclerotic calcification of the vertebral and internal carotid arteries at the skull base. No abnormal hyperdensity of the major intracranial arteries or dural venous sinuses. Skull: The visualized skull base, calvarium and extracranial soft tissues are normal. Sinuses/Orbits: No fluid levels or advanced mucosal thickening of the visualized paranasal sinuses. No mastoid or middle ear effusion. The orbits are normal. IMPRESSION: 1. No acute intracranial abnormality. 2. Atrophy and chronic ischemic microangiopathy. Electronically Signed   By: Ulyses Jarred M.D.   On: 07/16/2018 19:49   US Renal  Result Date: Jul 24, 2018 CLINICAL DATA:  Acute  on chronic renal failure. History of hypertension, diabetes, chronic kidney disease, coronary artery disease. EXAM: RENAL / URINARY TRACT ULTRASOUND COMPLETE COMPARISON:  CT abdomen and pelvis 10/19/2017 FINDINGS: Right Kidney: Renal measurements: 8.4 x 4.3 x 4 cm = volume: 79 mL. Multiple renal cysts, largest measuring 2.7 cm diameter. Cysts previously identified on CT. Thin septation demonstrated in 1 of the cysts, also seen at previous CT. No hydronephrosis. Left Kidney: Renal measurements: 7.5 x 4.7 x 3.5 cm = volume: 67 mL. Multiple parenchymal cysts, largest measuring about 3.4 cm diameter. Cysts also were demonstrated at previous CT. No hydronephrosis. Bladder: Appears normal for degree of bladder distention. IMPRESSION: Multiple bilateral renal cysts with benign appearance. No hydronephrosis. Electronically Signed   By: Lucienne Capers M.D.   On: 2018-07-24 01:52   Dg Chest Port 1 View  Result Date: 24-Jul-2018 CLINICAL DATA:  Dyspnea. History of hypertension, heart disease, nonsmoker EXAM: PORTABLE CHEST 1 VIEW COMPARISON:  07/20/2018 FINDINGS: Postoperative changes in the mediastinum. Cardiac pacemaker. Cardiac enlargement without vascular congestion. Bilateral pleural effusions with basilar atelectasis, greater on the left. Mild progression since previous study. No pneumothorax. Calcification of the aorta. IMPRESSION: Cardiac enlargement. Bilateral pleural effusions with basilar atelectasis, greater on the left. Mild progression since previous study. Electronically Signed   By: Lucienne Capers M.D.   On: Jul 24, 2018 00:26   Dg Chest Port 1 View  Result Date: 07/20/2018 CLINICAL DATA:  Leukocytosis EXAM: PORTABLE CHEST 1 VIEW COMPARISON:  07/17/2018 FINDINGS: Cardiomegaly, prior CABG. Left pacer remains in place, unchanged. Layering bilateral effusions. Mild vascular congestion. Left base atelectasis or infiltrate. IMPRESSION: Cardiomegaly with vascular congestion.  Small bilateral effusions. Left  lower lobe atelectasis or infiltrate. No real change. Electronically Signed   By: Rolm Baptise M.D.   On: 07/20/2018 07:30   Dg Chest Port 1 View  Result Date: 07/17/2018 CLINICAL DATA:  Fever and shortness of breath EXAM: PORTABLE CHEST 1 VIEW COMPARISON:  July 14, 2018 FINDINGS: There is airspace consolidation in the left lower lobe with small left pleural effusion. Lungs elsewhere are clear. Heart is mildly enlarged with pulmonary vascularity normal. No adenopathy evident. There is aortic atherosclerosis. Patient is status post coronary artery bypass grafting. Pacemaker leads are attached to the right atrium and right ventricle. No evident bone lesions. IMPRESSION: Airspace consolidation left lower lobe with small left pleural effusion. Right lung clear. Stable cardiac prominence. Stable postoperative changes. Electronically Signed   By: Lowella Grip III M.D.   On: 07/17/2018 07:29   Dg Chest Port 1 View  Result Date: 07/03/2018 CLINICAL DATA:  Sepsis EXAM: PORTABLE CHEST 1 VIEW COMPARISON:  05/25/2018 FINDINGS: Post sternotomy changes. Left-sided pacing device. Cardiomegaly. Small left pleural effusion with left basilar airspace disease. Aortic atherosclerosis. No pneumothorax. Apical pleural calcification on the left. IMPRESSION: 1. Cardiomegaly with small left pleural effusion and left basilar atelectasis or pneumonia. Electronically Signed   By: Donavan Foil M.D.   On: 07/10/2018 18:19   Dg Abd Portable 1v  Result Date: 07/19/2018 CLINICAL DATA:  Encounter for feeding tube placement. EXAM: PORTABLE ABDOMEN - 1 VIEW COMPARISON:  Abdominal CT 10/19/2017 FINDINGS: Feeding tube extends in the abdomen and the tip is in the stomach body region. Patient has a TrapEase IVC filter. Nonobstructive bowel gas pattern. Cardiac silhouette appears to be enlarged with pacemaker leads. Limited evaluation of the lung bases. Aortic atherosclerosis. IMPRESSION: Feeding tube in the stomach body region.  Electronically Signed   By: Markus Daft M.D.   On: 07/19/2018 17:50   Vas [place] Upper Extremity Venous Duplex  Result Date: 07/18/2018 UPPER VENOUS STUDY  Indications: Swelling Limitations: Poor ultrasound/tissue interface, bandages, open wound and patient cooperation. Performing Technologist: Oliver Hum RVT  Examination Guidelines: A complete evaluation includes B-mode imaging, spectral Doppler, color Doppler, and power Doppler as needed of all accessible portions of each vessel. Bilateral testing is considered an integral part of a complete examination. Limited examinations for reoccurring indications may be performed as noted.  Right Findings: +----------+------------+----------+---------+-----------+-----------------+ RIGHT     CompressiblePropertiesPhasicitySpontaneous     Summary      +----------+------------+----------+---------+-----------+-----------------+ IJV           Full                 Yes       Yes                      +----------+------------+----------+---------+-----------+-----------------+ Subclavian    Full                 Yes       Yes                      +----------+------------+----------+---------+-----------+-----------------+ Axillary      Full                 Yes       Yes                      +----------+------------+----------+---------+-----------+-----------------+ Brachial      Full  Yes       Yes                      +----------+------------+----------+---------+-----------+-----------------+ Radial        Full                                                    +----------+------------+----------+---------+-----------+-----------------+ Ulnar         Full                                                    +----------+------------+----------+---------+-----------+-----------------+ Cephalic      None                                  Age Indeterminate  +----------+------------+----------+---------+-----------+-----------------+ Basilic       Full                                                    +----------+------------+----------+---------+-----------+-----------------+  Left Findings: +----------+------------+----------+---------+-----------+-----------------+ LEFT      CompressiblePropertiesPhasicitySpontaneous     Summary      +----------+------------+----------+---------+-----------+-----------------+ IJV           Full                 Yes       Yes                      +----------+------------+----------+---------+-----------+-----------------+ Subclavian    Full                 Yes       Yes                      +----------+------------+----------+---------+-----------+-----------------+ Axillary      Full                 Yes       Yes                      +----------+------------+----------+---------+-----------+-----------------+ Brachial      Full                 Yes       Yes                      +----------+------------+----------+---------+-----------+-----------------+ Radial        Full                                                    +----------+------------+----------+---------+-----------+-----------------+ Ulnar         Full                                                    +----------+------------+----------+---------+-----------+-----------------+  Cephalic      None                                  Age Indeterminate +----------+------------+----------+---------+-----------+-----------------+ Basilic       None                                  Age Indeterminate +----------+------------+----------+---------+-----------+-----------------+  Summary:  Right: No evidence of deep vein thrombosis in the upper extremity. Findings consistent with age indeterminate superficial vein thrombosis involving the right cephalic vein.  Left: No evidence of deep vein thrombosis in the upper  extremity. Findings consistent with age indeterminate superficial vein thrombosis involving the left cephalic vein and left basilic vein.  *See table(s) above for measurements and observations.  Diagnosing physician: Deitra Mayo MD Electronically signed by Deitra Mayo MD on 07/18/2018 at 4:36:28 PM.    Final    Ct Maxillofacial Wo Contrast  Result Date: 07/18/2018 CLINICAL DATA:  Sinus drainage with fever, question sinus infection. EXAM: CT MAXILLOFACIAL WITHOUT CONTRAST TECHNIQUE: Multidetector CT imaging of the maxillofacial structures was performed. Multiplanar CT image reconstructions were also generated. COMPARISON:  None. FINDINGS: Osseous: No acute fracture bone destruction. Orbits: Intact orbits and globes with bilateral lens replacements. No retrobulbar abnormality. No acute inflammatory process. Sinuses: Mild ethmoid and mild-to-moderate sphenoid sinus mucosal thickening. The frontal sinuses and mastoids appear clear. Soft tissues: No significant soft tissue swelling. Limited intracranial: Atrophy with chronic small vessel ischemia. IMPRESSION: 1. Mild ethmoid and mild to moderate sphenoid sinus mucosal thickening. No air-fluid levels to suggest acute sinusitis. 2. No acute maxillofacial fracture. Electronically Signed   By: Ashley Royalty M.D.   On: 07/18/2018 15:17    Wt Readings from Last 3 Encounters:  07/19/18 61 kg  05/27/18 66.3 kg  02/27/18 65.2 kg    EKG: AV paced  Physical Exam: Blood pressure (!) 144/58, pulse 68, temperature 97.9 F (36.6 C), temperature source Axillary, resp. rate (!) 41, weight 61 kg, SpO2 99 %. Body mass index is 18.76 kg/m. General: Well developed, well nourished, in no acute distress. Head: Normocephalic, atraumatic, sclera non-icteric, no xanthomas, nares are without discharge.  Neck: Negative for carotid bruits. JVD to earlobe Lungs: Tachypneic with increased work of breathing Heart: RRR with S1 S2. No murmurs, rubs, or gallops  appreciated. Abdomen: Soft, non-tender, non-distended with normoactive bowel sounds. No hepatomegaly. No rebound/guarding. No obvious abdominal masses. Msk:  Strength and tone appear normal for age. Extremities: No clubbing or cyanosis. No edema.  Distal pedal pulses are 2+ and equal bilaterally.     Assessment and Plan  85 year old man with ischemic cardiomyopathy, advanced CKD, dementia, who initially presented with concern for possible infection.  He decompensated this evening with significant tachypnea and was found to have a troponin of greater than 65.  Given his comorbidities especially his acute on chronic kidney injury, he is not a candidate for an invasive approach.   His family agrees and wishes to pursue medical therapy.  As such, would manage him with aspirin, heparin, and Plavix load (600 mg), followed by 75 mg daily.  Would use IV opiates and benzodiazepines to maximize patient comfort and address tachypnea.  We will follow along with you.  Signed, Doylene Canning, MD July 29, 2018, 5:16 AM

## 2018-08-02 DEATH — deceased
